# Patient Record
Sex: Female | Born: 1950 | Race: White | Hispanic: No | Marital: Single | Smoking: Never smoker
Health system: Southern US, Community
[De-identification: ages and names within clinical notes are randomized; demographics above are authoritative.]

## PROBLEM LIST (undated history)

## (undated) DIAGNOSIS — I10 Essential (primary) hypertension: Secondary | ICD-10-CM

## (undated) DIAGNOSIS — E66813 Obesity, class 3: Secondary | ICD-10-CM

## (undated) HISTORY — PX: STOMACH SURGERY: SHX791

## (undated) HISTORY — PX: JOINT REPLACEMENT: SHX530

## (undated) HISTORY — PX: BREAST BIOPSY: SHX20

## (undated) HISTORY — DX: Obesity, class 3: E66.813

## (undated) HISTORY — DX: Morbid (severe) obesity due to excess calories: E66.01

## (undated) HISTORY — PX: BREAST EXCISIONAL BIOPSY: SUR124

## (undated) HISTORY — PX: CHOLECYSTECTOMY: SHX55

---

## 1994-12-01 HISTORY — PX: BREAST CYST ASPIRATION: SHX578

## 1999-12-02 HISTORY — PX: ABDOMINAL HYSTERECTOMY: SHX81

## 2004-09-17 ENCOUNTER — Ambulatory Visit: Payer: Self-pay | Admitting: Unknown Physician Specialty

## 2004-10-07 ENCOUNTER — Inpatient Hospital Stay: Payer: Self-pay

## 2005-11-20 ENCOUNTER — Ambulatory Visit: Payer: Self-pay | Admitting: Unknown Physician Specialty

## 2006-05-25 ENCOUNTER — Ambulatory Visit: Payer: Self-pay | Admitting: Physician Assistant

## 2006-11-25 ENCOUNTER — Ambulatory Visit: Payer: Self-pay | Admitting: Unknown Physician Specialty

## 2008-06-05 ENCOUNTER — Other Ambulatory Visit: Payer: Self-pay

## 2008-06-05 ENCOUNTER — Ambulatory Visit: Payer: Self-pay | Admitting: Unknown Physician Specialty

## 2008-06-13 ENCOUNTER — Inpatient Hospital Stay: Payer: Self-pay | Admitting: Unknown Physician Specialty

## 2008-07-18 ENCOUNTER — Encounter: Payer: Self-pay | Admitting: Unknown Physician Specialty

## 2008-08-01 ENCOUNTER — Encounter: Payer: Self-pay | Admitting: Unknown Physician Specialty

## 2008-08-31 ENCOUNTER — Encounter: Payer: Self-pay | Admitting: Unknown Physician Specialty

## 2009-09-25 ENCOUNTER — Observation Stay: Payer: Self-pay | Admitting: Internal Medicine

## 2010-04-17 ENCOUNTER — Ambulatory Visit: Payer: Self-pay | Admitting: Unknown Physician Specialty

## 2011-07-31 ENCOUNTER — Ambulatory Visit: Payer: Self-pay | Admitting: Unknown Physician Specialty

## 2011-12-16 ENCOUNTER — Ambulatory Visit (INDEPENDENT_AMBULATORY_CARE_PROVIDER_SITE_OTHER): Payer: 59 | Admitting: Internal Medicine

## 2011-12-16 ENCOUNTER — Encounter: Payer: Self-pay | Admitting: Internal Medicine

## 2011-12-16 DIAGNOSIS — E559 Vitamin D deficiency, unspecified: Secondary | ICD-10-CM

## 2011-12-16 DIAGNOSIS — E785 Hyperlipidemia, unspecified: Secondary | ICD-10-CM

## 2011-12-16 DIAGNOSIS — Z79899 Other long term (current) drug therapy: Secondary | ICD-10-CM

## 2011-12-16 DIAGNOSIS — J45909 Unspecified asthma, uncomplicated: Secondary | ICD-10-CM

## 2011-12-16 DIAGNOSIS — R5383 Other fatigue: Secondary | ICD-10-CM

## 2011-12-16 DIAGNOSIS — Z1239 Encounter for other screening for malignant neoplasm of breast: Secondary | ICD-10-CM

## 2011-12-16 DIAGNOSIS — E162 Hypoglycemia, unspecified: Secondary | ICD-10-CM

## 2011-12-16 DIAGNOSIS — M542 Cervicalgia: Secondary | ICD-10-CM

## 2011-12-16 DIAGNOSIS — R5381 Other malaise: Secondary | ICD-10-CM

## 2011-12-16 DIAGNOSIS — M5000 Cervical disc disorder with myelopathy, unspecified cervical region: Secondary | ICD-10-CM | POA: Insufficient documentation

## 2011-12-16 DIAGNOSIS — E66813 Obesity, class 3: Secondary | ICD-10-CM

## 2011-12-16 DIAGNOSIS — G4730 Sleep apnea, unspecified: Secondary | ICD-10-CM

## 2011-12-16 MED ORDER — FLUTICASONE-SALMETEROL 250-50 MCG/DOSE IN AEPB: 1.0000 | INHALATION_SPRAY | Freq: Two times a day (BID) | RESPIRATORY_TRACT | Status: DC

## 2011-12-16 MED ORDER — ALBUTEROL SULFATE HFA 108 (90 BASE) MCG/ACT IN AERS: 2.0000 | INHALATION_SPRAY | Freq: Four times a day (QID) | RESPIRATORY_TRACT | Status: DC | PRN

## 2011-12-16 NOTE — Assessment & Plan Note (Addendum)
she is under the care of neyrologist presently and using cymbalta and gabapetnin with good results.  No changes today

## 2011-12-17 ENCOUNTER — Encounter: Payer: Self-pay | Admitting: Internal Medicine

## 2011-12-17 DIAGNOSIS — E1169 Type 2 diabetes mellitus with other specified complication: Secondary | ICD-10-CM | POA: Insufficient documentation

## 2011-12-17 DIAGNOSIS — Z79899 Other long term (current) drug therapy: Secondary | ICD-10-CM | POA: Insufficient documentation

## 2011-12-17 LAB — HEMOGLOBIN A1C: Hgb A1c MFr Bld: 6.3 % (ref 4.6–6.5)

## 2011-12-17 LAB — COMPREHENSIVE METABOLIC PANEL
Albumin: 4.3 g/dL (ref 3.5–5.2)
BUN: 15 mg/dL (ref 6–23)
CO2: 29 mEq/L (ref 19–32)
GFR: 98.63 mL/min (ref >60.00)
Glucose, Bld: 90 mg/dL (ref 70–99)
Sodium: 138 mEq/L (ref 135–145)
Total Bilirubin: 0.8 mg/dL (ref 0.3–1.2)
Total Protein: 7.4 g/dL (ref 6.0–8.3)

## 2011-12-17 LAB — VITAMIN D 25 HYDROXY (VIT D DEFICIENCY, FRACTURES): Vit D, 25-Hydroxy: 73 ng/mL (ref 30–89)

## 2011-12-17 NOTE — Assessment & Plan Note (Signed)
Managed with statin.  LFTs due

## 2011-12-17 NOTE — Progress Notes (Signed)
  Subjective:    Patient ID: Katie Huber, female    DOB: [birth year], 61 y.o.   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  She takes a statin and has not had LFTs done in 9 months,  She is also overdue for mammogram.  No specific complaints except frustration with inability to maintain weight loss .  She has been able in the past to lose 100 lbs but cannot keep it off. 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    Past Medical History  Diagnosis Date  . Asthma 1990  . Obesity, Class III, BMI 40-49.9 (morbid obesity)     weight fluctuations of 100 lbs    Past Surgical History  Procedure Date  . Joint replacement     Bilateral  . Abdominal hysterectomy 2001  . Stomach surgery [phone]    gastric partitionin( for obesity)   . Cholecystectomy 1970s    No current outpatient prescriptions on file prior to visit.       Review of Systems  Constitutional: Negative for fever, chills, appetite change, fatigue and unexpected weight change.  HENT: Negative for ear pain, congestion, sore throat, trouble swallowing, neck pain, voice change and sinus pressure.   Eyes: Negative for visual disturbance.  Respiratory: Negative for cough, shortness of breath, wheezing and stridor.   Cardiovascular: Negative for chest pain, palpitations and leg swelling.  Gastrointestinal: Negative for nausea, vomiting, abdominal pain, diarrhea, constipation, blood in stool, abdominal distention and anal bleeding.  Genitourinary: Negative for dysuria and flank pain.  Musculoskeletal: Negative for myalgias, arthralgias and gait problem.  Skin: Negative for color change and rash.  Neurological: Negative for dizziness and headaches.  Hematological: Negative for adenopathy. Does not bruise/bleed easily.  Psychiatric/Behavioral:  Negative for suicidal ideas, sleep disturbance and dysphoric mood. The patient is not nervous/anxious.        Objective:   Physical Exam        Assessment & Plan:

## 2011-12-17 NOTE — Assessment & Plan Note (Signed)
Spent 10 minutes reviewing her diet and available optiosn ,  Reinforced need for aerobic exercise and recommended water aerobics.

## 2011-12-18 LAB — TSH: TSH: 0.76 u[IU]/mL (ref 0.35–5.50)

## 2012-01-12 ENCOUNTER — Encounter: Payer: Self-pay | Admitting: Physical Medicine and Rehabilitation

## 2012-01-19 ENCOUNTER — Other Ambulatory Visit: Payer: Self-pay | Admitting: Internal Medicine

## 2012-01-19 MED ORDER — PAROXETINE HCL 20 MG PO TABS: 20.0000 mg | ORAL_TABLET | ORAL | Status: DC

## 2012-01-19 NOTE — Telephone Encounter (Signed)
Patient is needing a prescription faxed to [facility] Paxil 20 mg bid 90 day supply.

## 2012-01-19 NOTE — Telephone Encounter (Signed)
rx on printer [facility] to [facility]

## 2012-01-21 ENCOUNTER — Other Ambulatory Visit: Payer: Self-pay | Admitting: *Deleted

## 2012-01-21 MED ORDER — PAROXETINE HCL 20 MG PO TABS: 20.0000 mg | ORAL_TABLET | ORAL | Status: DC

## 2012-01-21 NOTE — Telephone Encounter (Signed)
Order printed on 01/19/12 sent [facility] today to [facility].

## 2012-01-28 ENCOUNTER — Telehealth: Payer: Self-pay | Admitting: Internal Medicine

## 2012-01-28 DIAGNOSIS — Z79899 Other long term (current) drug therapy: Secondary | ICD-10-CM

## 2012-01-28 NOTE — Telephone Encounter (Signed)
Left message asking patient to return my call.

## 2012-01-28 NOTE — Telephone Encounter (Signed)
Office Message [address] [place], [place] [address] p. [phone] f. [phone] To: [facility] (Daytime Triage) Fax: [phone] From: [facility] Date/ Time: 01/28/2012 3:52 PM Taken By: Thomasena Edis, CSR Caller: Silvio Pate Facility: not collected Patient: Katie Huber, Katie Huber DOB: 06-26-1951 Phone: [phone] Reason for Call: Please call patient she says that the pharmacy has canceled her paxal order and she is out of it and going through withdrawals of panic attack and nausea and that she needs some sent to local pharmacy so she can pick this up. Please call asap. Regarding Appointment: Appt Date: Appt Time: Unknown Provider: Reason: Details: Outcome:

## 2012-01-28 NOTE — Telephone Encounter (Signed)
Office Message [address] [place], [place] [address] p. [phone] f. [phone] To: [facility] (Daytime Triage) Fax: [phone] From: [facility] Date/ Time: 01/28/2012 10:33 AM Taken By: Annalee Genta, CSR Caller: Silvio Pate Facility: not collected Patient: Katie, Huber DOB: 09-26-1951 Phone: [phone] Reason for Call: [facility] has faxed request for Paxil refill; pt states pharmacist did not receive reply so they have cancelled this script. Please call her as soon as possible to advise. Regarding Appointment: Appt Date: Appt Time: Unknown Provider: Reason: Details: Outcome:

## 2012-01-29 MED ORDER — PAROXETINE HCL 20 MG PO TABS: 20.0000 mg | ORAL_TABLET | ORAL | Status: DC

## 2012-01-29 NOTE — Telephone Encounter (Signed)
Rx sent to pharmacy   

## 2012-01-29 NOTE — Telephone Encounter (Signed)
Ok to call in Paxil 20 mg one tablet daily #30 with 3 refills to local pharmacy , chart updated

## 2012-01-30 ENCOUNTER — Encounter: Payer: Self-pay | Admitting: Physical Medicine and Rehabilitation

## 2012-03-01 ENCOUNTER — Encounter: Payer: Self-pay | Admitting: Physical Medicine and Rehabilitation

## 2012-04-13 ENCOUNTER — Ambulatory Visit: Payer: Self-pay | Admitting: Internal Medicine

## 2012-04-14 LAB — HM MAMMOGRAPHY: HM Mammogram: NORMAL

## 2012-05-11 ENCOUNTER — Encounter: Payer: Self-pay | Admitting: Internal Medicine

## 2012-06-02 ENCOUNTER — Ambulatory Visit: Payer: 59 | Admitting: Internal Medicine

## 2012-08-04 ENCOUNTER — Other Ambulatory Visit: Payer: Self-pay | Admitting: *Deleted

## 2012-08-04 MED ORDER — PAROXETINE HCL 20 MG PO TABS: 20.0000 mg | ORAL_TABLET | ORAL | Status: DC

## 2012-09-20 LAB — HM DIABETES EYE EXAM: HM Diabetic Eye Exam: NORMAL

## 2012-10-18 ENCOUNTER — Other Ambulatory Visit: Payer: Self-pay | Admitting: *Deleted

## 2012-10-20 ENCOUNTER — Other Ambulatory Visit: Payer: Self-pay

## 2012-10-20 MED ORDER — PAROXETINE HCL 20 MG PO TABS: 20.0000 mg | ORAL_TABLET | ORAL | Status: DC

## 2012-10-20 NOTE — Telephone Encounter (Signed)
90 days, no refills  Needs offive visit and labs before any more refills of any meds.

## 2012-10-20 NOTE — Telephone Encounter (Signed)
Patient request a refill for generic and she wants a 90 day supply.

## 2012-10-21 ENCOUNTER — Other Ambulatory Visit: Payer: Self-pay

## 2012-10-25 ENCOUNTER — Other Ambulatory Visit: Payer: Self-pay | Admitting: Internal Medicine

## 2012-10-27 ENCOUNTER — Other Ambulatory Visit: Payer: Self-pay

## 2012-10-29 ENCOUNTER — Other Ambulatory Visit: Payer: Self-pay

## 2012-11-15 ENCOUNTER — Encounter: Payer: Self-pay | Admitting: Internal Medicine

## 2012-11-15 ENCOUNTER — Ambulatory Visit (INDEPENDENT_AMBULATORY_CARE_PROVIDER_SITE_OTHER): Payer: Self-pay | Admitting: Internal Medicine

## 2012-11-15 DIAGNOSIS — G4730 Sleep apnea, unspecified: Secondary | ICD-10-CM

## 2012-11-15 DIAGNOSIS — Z79899 Other long term (current) drug therapy: Secondary | ICD-10-CM

## 2012-11-15 MED ORDER — OMEPRAZOLE 20 MG PO CPDR: 20.0000 mg | DELAYED_RELEASE_CAPSULE | Freq: Two times a day (BID) | ORAL | Status: DC

## 2012-11-15 NOTE — Patient Instructions (Addendum)
Please let me know if I can help with the referral for your eating disorder  Return for fasting labs at your earliest convenience.

## 2012-11-15 NOTE — Progress Notes (Signed)
Patient ID: Katie Huber, female   DOB: Dec 05, 1950, 61 y.o.   MRN: [id number] \ Patient Active Problem List  Diagnosis  . Cervicalgia  . Obesity, Class III, BMI 40-49.9 (morbid obesity)  . Sleep apnea  . Other and unspecified hyperlipidemia  . Encounter for long-term (current) use of other medications    Subjective:  CC:   Chief Complaint  Patient presents with  . Medication Refill    HPI:   Katie Huber a 61 y.o. female who presents Follow up on chronic conditions including morbid obesity, OSA on CPAP , hypertension.  She has had no resp event this year.  Has gained 27 lb this year.  Eats bread and butter daily. Vegetable,  Had gastric bypass years ago.  States that she has  Had no difficulty losing 100 lbs at a time ,  But gains it back. Thinks she has an eating disorder but does not want a referral "not a good time.' Doesn't feel she is depressed . Lives alone for the last several years after her long term companion died a few years ago .  Her dog died a few months ago after being hit by a car while they were walking. Cries easily when she talks about her.  Has grown children nearby.  Bilateral shoulder pain with prior EMG nerve conduction studies. Didn't finish workup.  Taking advil.   Has OSA, ;last sleep study over 5 yrs ago,  Wears her CPAP every night.  No morning headaches , morning fatigue, or daytime somlonenc.e .  BP at  Home usually under 140/70.  NEEDS REFILLS ON ALL MEDS    Past Medical History  Diagnosis Date  . Asthma 1990  . Obesity, Class III, BMI 40-49.9 (morbid obesity)     weight fluctuations of 100 lbs     Past Surgical History  Procedure Date  . Joint replacement     Bilateral  . Abdominal hysterectomy 2001  . Stomach surgery [phone]    gastric partitionin( for obesity)   . Cholecystectomy 1970s    The following portions of the patient's history were reviewed and updated as appropriate: Allergies, current medications, and problem list.    Review  of Systems:   Patient denies headache, fevers, malaise, unintentional weight loss, skin rash, eye pain, sinus congestion and sinus pain, sore throat, dysphagia,  hemoptysis , cough, dyspnea, wheezing, chest pain, palpitations, orthopnea, edema, abdominal pain, nausea, melena, diarrhea, constipation, flank pain, dysuria, hematuria, urinary  Frequency, nocturia, numbness, tingling, seizures,  Focal weakness, Loss of consciousness,  Tremor, insomnia, depression, anxiety, and suicidal ideation.        History   Social History  . Marital Status: Single    Spouse Name: N/A    Number of Children: N/A  . Years of Education: N/A   Occupational History  . Not on file.   Social History Main Topics  . Smoking status: Never Smoker   . Smokeless tobacco: Not on file  . Alcohol Use: Not on file  . Drug Use: Not on file  . Sexually Active: Not on file   Other Topics Concern  . Not on file   Social History Narrative  . No narrative on file    Objective:  BP 160/72  Pulse 66  Temp 97.9 F (36.6 C) (Oral)  Resp 12  Ht 5\' 7"  (1.702 m)  Wt 350 lb 8 oz (158.986 kg)  BMI 54.90 kg/m2  SpO2 95%  General appearance: alert, cooperative and appears stated age. Morbidly  obese Ears: normal TM's and external ear canals both ears Throat: lips, mucosa, and tongue normal; teeth and gums normal Neck: no adenopathy, no carotid bruit, supple, symmetrical, trachea midline and thyroid not enlarged, symmetric, no tenderness/mass/nodules Back: symmetric, no curvature. ROM normal. No CVA tenderness. Lungs: clear to auscultation bilaterally Heart: regular rate and rhythm, S1, S2 normal, no murmur, click, rub or gallop Abdomen: soft, non-tender; bowel sounds normal; no masses,  no organomegaly Pulses: 2+ and symmetric Skin: Skin color, texture, turgor normal. No rashes or lesions Lymph nodes: Cervical, supraclavicular, and axillary nodes normal.  Assessment and Plan:  Encounter for long-term (current)  use of other medications CMET due for use of crestor and maxzide  Obesity, Class III, BMI 40-49.9 (morbid obesity) I have addressed  BMI and recommended a low glycemic index diet utilizing smaller more frequent meals to increase metabolism.  I have also recommended that patient start exercising with a goal of 30 minutes of aerobic exercise a minimum of 5 days per week. Screening for lipid disorders, thyroid and diabetes to be done today.  She is resistant to structured diet, does not exercise, and has gained 27 ls this year,  BMI over 50 despite prior bariatric surgery .  Referral for eating disordered offered but deferred,   Sleep apnea She reports compliance. Last study was 5 years ago; does not want repeat study although it was recommended   Updated Medication List Outpatient Encounter Prescriptions as of 11/15/2012  Medication Sig Dispense Refill  . albuterol (PROVENTIL HFA;VENTOLIN HFA) 108 (90 BASE) MCG/ACT inhaler Inhale 2 puffs into the lungs every 6 (six) hours as needed for wheezing.  3 Inhaler  3  . amLODipine (NORVASC) 5 MG tablet Take 5 mg by mouth daily.      . Fluticasone-Salmeterol (ADVAIR DISKUS) 250-50 MCG/DOSE AEPB Inhale 1 puff into the lungs 2 (two) times daily.  180 each  3  . omeprazole (PRILOSEC) 20 MG capsule Take 1 capsule (20 mg total) by mouth 2 (two) times daily.  60 capsule  11  . PARoxetine (PAXIL) 20 MG tablet Take 1 tablet (20 mg total) by mouth every morning.  90 tablet  0  . Probiotic Product (PROBIOTIC DAILY PO) Take by mouth daily.      [name] triamterene-hydrochlorothiazide (MAXZIDE-25) 37.5-25 MG per tablet Take 1 tablet by mouth daily.      . [DISCONTINUED] omeprazole (PRILOSEC) 20 MG capsule Take 20 mg by mouth daily.      . DULoxetine (CYMBALTA) 60 MG capsule Take 60 mg by mouth daily.      [name] gabapentin (NEURONTIN) 600 MG tablet Take 600 mg by mouth 3 (three) times daily.      . rosuvastatin (CRESTOR) 5 MG tablet Take 5 mg by mouth daily.          Orders Placed This Encounter  Procedures  . Lipid panel  . Comprehensive metabolic panel  . TSH    No Follow-up on file.

## 2012-11-16 ENCOUNTER — Encounter: Payer: Self-pay | Admitting: Internal Medicine

## 2012-11-16 NOTE — Assessment & Plan Note (Signed)
CMET due for use of crestor and maxzide

## 2012-11-16 NOTE — Assessment & Plan Note (Signed)
She reports compliance. Last study was 5 years ago; does not want repeat study although it was recommended

## 2012-11-16 NOTE — Assessment & Plan Note (Signed)
I have addressed  BMI and recommended a low glycemic index diet utilizing smaller more frequent meals to increase metabolism.  I have also recommended that patient start exercising with a goal of 30 minutes of aerobic exercise a minimum of 5 days per week. Screening for lipid disorders, thyroid and diabetes to be done today.  She is resistant to structured diet, does not exercise, and has gained 27 ls this year,  BMI over 50 despite prior bariatric surgery .  Referral for eating disordered offered but deferred,

## 2012-11-23 ENCOUNTER — Other Ambulatory Visit: Payer: Self-pay

## 2012-11-30 ENCOUNTER — Ambulatory Visit: Payer: 59 | Admitting: Internal Medicine

## 2012-12-17 ENCOUNTER — Ambulatory Visit: Payer: 59 | Admitting: Internal Medicine

## 2013-01-20 ENCOUNTER — Other Ambulatory Visit: Payer: Self-pay | Admitting: *Deleted

## 2013-01-20 MED ORDER — PAROXETINE HCL 20 MG PO TABS: 20.0000 mg | ORAL_TABLET | ORAL | Status: DC

## 2013-01-20 NOTE — Telephone Encounter (Signed)
Med filled.  

## 2013-01-21 ENCOUNTER — Other Ambulatory Visit: Payer: Self-pay | Admitting: General Practice

## 2013-01-21 MED ORDER — PAROXETINE HCL 20 MG PO TABS: 20.0000 mg | ORAL_TABLET | ORAL | Status: DC

## 2013-01-21 MED ORDER — TRIAMTERENE-HCTZ 37.5-25 MG PO TABS: 1.0000 | ORAL_TABLET | Freq: Every day | ORAL | Status: DC

## 2013-01-21 NOTE — Telephone Encounter (Signed)
Med filled.  

## 2013-02-03 ENCOUNTER — Telehealth: Payer: Self-pay | Admitting: Internal Medicine

## 2013-02-03 NOTE — Telephone Encounter (Signed)
Pt came by her insurance has changed and she needs all  her rx's sent to [facility]   Fax # [phone] Please advise when these are sent in

## 2013-02-07 ENCOUNTER — Other Ambulatory Visit: Payer: Self-pay | Admitting: General Practice

## 2013-02-07 MED ORDER — TRIAMTERENE-HCTZ 37.5-25 MG PO TABS: 1.0000 | ORAL_TABLET | Freq: Every day | ORAL | Status: DC

## 2013-02-07 MED ORDER — PAROXETINE HCL 20 MG PO TABS: 20.0000 mg | ORAL_TABLET | ORAL | Status: DC

## 2013-02-07 MED ORDER — AMLODIPINE BESYLATE 5 MG PO TABS: 5.0000 mg | ORAL_TABLET | Freq: Every day | ORAL | Status: DC

## 2013-02-07 MED ORDER — OMEPRAZOLE 20 MG PO CPDR: 20.0000 mg | DELAYED_RELEASE_CAPSULE | Freq: Two times a day (BID) | ORAL | Status: DC

## 2013-02-07 NOTE — Telephone Encounter (Signed)
Meds sent to [facility].

## 2013-02-07 NOTE — Telephone Encounter (Signed)
Meds filled for 90 days and sent to [facility].

## 2013-02-11 ENCOUNTER — Other Ambulatory Visit (INDEPENDENT_AMBULATORY_CARE_PROVIDER_SITE_OTHER): Payer: BC Managed Care – PPO

## 2013-02-11 LAB — COMPREHENSIVE METABOLIC PANEL
Albumin: 4 g/dL (ref 3.5–5.2)
BUN: 14 mg/dL (ref 6–23)
CO2: 29 mEq/L (ref 19–32)
Calcium: 9.4 mg/dL (ref 8.4–10.5)
GFR: 87.31 mL/min (ref >60.00)
Glucose, Bld: 145 mg/dL — ABNORMAL HIGH (ref 70–99)
Potassium: 4.1 mEq/L (ref 3.5–5.1)
Sodium: 136 mEq/L (ref 135–145)
Total Protein: 7.3 g/dL (ref 6.0–8.3)

## 2013-02-11 LAB — TSH: TSH: 1.05 u[IU]/mL (ref 0.35–5.50)

## 2013-02-11 LAB — LDL CHOLESTEROL, DIRECT: Direct LDL: 119.1 mg/dL

## 2013-03-01 ENCOUNTER — Encounter: Payer: Self-pay | Admitting: Internal Medicine

## 2013-03-01 ENCOUNTER — Ambulatory Visit (INDEPENDENT_AMBULATORY_CARE_PROVIDER_SITE_OTHER): Payer: BC Managed Care – PPO | Admitting: Internal Medicine

## 2013-03-01 VITALS — BP 140/72 | HR 66 | Temp 98.2°F | Wt 361.0 lb

## 2013-03-01 DIAGNOSIS — E119 Type 2 diabetes mellitus without complications: Secondary | ICD-10-CM

## 2013-03-01 DIAGNOSIS — E1169 Type 2 diabetes mellitus with other specified complication: Secondary | ICD-10-CM

## 2013-03-01 DIAGNOSIS — E669 Obesity, unspecified: Secondary | ICD-10-CM

## 2013-03-01 NOTE — Patient Instructions (Addendum)

## 2013-03-02 DIAGNOSIS — E1169 Type 2 diabetes mellitus with other specified complication: Secondary | ICD-10-CM | POA: Insufficient documentation

## 2013-03-02 NOTE — Assessment & Plan Note (Addendum)
A1c 6.3, fasting glucose 145.  I have addressed  BMI and new onset diabetes and recommended a low glycemic index diet utilizing smaller more frequent meals to increase metabolism.  I have also recommended that patient start exercising with a goal of 30 minutes of aerobic exercise a minimum of 5 days per week.  Diabetic  eye exam advised. Foot exam normal.

## 2013-03-02 NOTE — Progress Notes (Signed)
Patient ID: Katie Huber, female   DOB: 08-Dec-1950, 62 y.o.   MRN: [id number] Subjective:    Katie Huber is a 62 y.o. female who presents for an initial evaluation of Type 2 diabetes mellitus.  Current symptoms/problems include none . The patient was initially diagnosed with Type 2 diabetes mellitus based on the following criteria:  fasting glucose 145,  hgba1c 6.3 .  Known diabetic complications: none Cardiovascular risk factors: obesity (BMI >= 30 kg/m2) Current diabetic medications include none.   Eye exam current (within one year): unknown Weight trend: increasing steadily Prior visit with dietician: no Current diet: in general, an "unhealthy" diet Current exercise: none  Current monitoring regimen: none Home blood sugar records: does not check Any episodes of hypoglycemia? no  Is She on ACE inhibitor or angiotensin II receptor blocker?  No   The following portions of the patient's history were reviewed and updated as appropriate: allergies, current medications, past family history, past medical history, past social history, past surgical history and problem list.  Review of Systems A comprehensive review of systems was negative.    Objective:    BP 140/72  Pulse 66  Temp(Src) 98.2 F (36.8 C) (Oral)  Wt 361 lb (163.749 kg)  BMI 56.53 kg/m2  SpO2 97%  General:  alert, cooperative and appears stated age  Oropharynx: lips, mucosa, and tongue normal; teeth and gums normal   Eyes:  conjunctivae/corneas clear. PERRL, EOM's intact. Fundi benign.   Ears:  normal TM's and external ear canals both ears  Neck: no adenopathy, no carotid bruit, no JVD, supple, symmetrical, trachea midline and thyroid not enlarged, symmetric, no tenderness/mass/nodules  Thyroid:  no palpable nodule  Lung: clear to auscultation bilaterally  Heart:  normal apical impulse  Abdomen: soft, non-tender; bowel sounds normal; no masses,  no organomegaly  Extremities: extremities normal, atraumatic, no  cyanosis or edema  Skin: warm and dry, no hyperpigmentation, vitiligo, or suspicious lesions  Pulses: 2+ and symmetric  Neuro: normal without focal findings, mental status, speech normal, alert and oriented x3, PERLA and reflexes normal and symmetric   Lab Review Glucose, Bld (mg/dL)  Date Value  7/82/9562 145*  12/16/2011 90      CO2 (mEq/L)  Date Value  02/11/2013 29   12/16/2011 29      BUN (mg/dL)  Date Value  12/31/8655 14   12/16/2011 15      Creatinine, Ser (mg/dL)  Date Value  8/46/9629 0.7   12/16/2011 0.7     Assessment and Plan  Diabetes mellitus type 2 in obese A1c 6.3, fasting glucose 145.  I have addressed  BMI and new onset diabetes and recommended a low glycemic index diet utilizing smaller more frequent meals to increase metabolism.  I have also recommended that patient start exercising with a goal of 30 minutes of aerobic exercise a minimum of 5 days per week.  Diabetic  eye exam advised. Foot exam normal.   A total of 30 minutes was spent with patient more than half of which was spent in counseling, reviewing records from other prviders and coordination of care.  Updated Medication List Outpatient Encounter Prescriptions as of 03/01/2013  Medication Sig Dispense Refill  . amLODipine (NORVASC) 5 MG tablet Take 1 tablet (5 mg total) by mouth daily.  90 tablet  1  . omeprazole (PRILOSEC) 20 MG capsule Take 1 capsule (20 mg total) by mouth 2 (two) times daily.  180 capsule  1  . PARoxetine (PAXIL) 20 MG tablet Take  1 tablet (20 mg total) by mouth every morning.  90 tablet  1  . Probiotic Product (PROBIOTIC DAILY PO) Take by mouth daily.      [name] triamterene-hydrochlorothiazide (MAXZIDE-25) 37.5-25 MG per tablet Take 1 each (1 tablet total) by mouth daily.  90 tablet  1  . albuterol (PROVENTIL HFA;VENTOLIN HFA) 108 (90 BASE) MCG/ACT inhaler Inhale 2 puffs into the lungs every 6 (six) hours as needed for wheezing.  3 Inhaler  3  . DULoxetine (CYMBALTA) 60 MG capsule  Take 60 mg by mouth daily.      . Fluticasone-Salmeterol (ADVAIR DISKUS) 250-50 MCG/DOSE AEPB Inhale 1 puff into the lungs 2 (two) times daily.  180 each  3  . gabapentin (NEURONTIN) 600 MG tablet Take 600 mg by mouth 3 (three) times daily.      . rosuvastatin (CRESTOR) 5 MG tablet Take 5 mg by mouth daily.       No facility-administered encounter medications on file as of 03/01/2013.

## 2013-04-05 ENCOUNTER — Telehealth: Payer: Self-pay | Admitting: *Deleted

## 2013-04-05 NOTE — Telephone Encounter (Signed)
Patient called stating received bill for bariatric, gave patient billing dept number please advise if I should do anything thanks

## 2013-04-08 ENCOUNTER — Telehealth: Payer: Self-pay | Admitting: Internal Medicine

## 2013-04-08 DIAGNOSIS — N390 Urinary tract infection, site not specified: Secondary | ICD-10-CM | POA: Insufficient documentation

## 2013-04-08 MED ORDER — SULFAMETHOXAZOLE-TRIMETHOPRIM 800-160 MG PO TABS: 1.0000 | ORAL_TABLET | Freq: Two times a day (BID) | ORAL | Status: DC

## 2013-04-08 NOTE — Telephone Encounter (Signed)
Patient Information:  Caller Name: Diarra  Phone: [phone]  Patient: Katie Huber, Katie Huber  Gender: Female  DOB: 10-14-1951  Age: 62 Years  PCP: Duncan Dull (Adults only)  Office Follow Up:  Does the office need to follow up with this patient?: Yes  Instructions For The Office: OFFICE PT IS REQUESTING A RX OF [facility] [place]  PT REFUSED AN APPT OFFERED BY RN.  OFFICE PLEASE FOLLOW UP WITH PT  RN Note:  RN offered patient an appt to come into office, but pt states MD told her to just call if she needed anything.  Symptoms  Reason For Call & Symptoms: UTI symptoms :  urgency, pain with urination, and blood in urine  Reviewed Health History In EMR: Yes  Reviewed Medications In EMR: Yes  Reviewed Allergies In EMR: Yes  Reviewed Surgeries / Procedures: Yes  Date of Onset of Symptoms: 04/08/2013  Guideline(s) Used:  Urination Pain - Female  Disposition Per Guideline:   See Today in Office  Reason For Disposition Reached:   Age > 50 years  Advice Given:  N/A  Patient Refused Recommendation:  Patient Requests Prescription  Pt is requesting RX of Bactrim for her urinary symptoms

## 2013-04-08 NOTE — Telephone Encounter (Signed)
Called patient to verify s\s patient states its a UTI and you have treated her before with Bactrim.

## 2013-04-08 NOTE — Telephone Encounter (Signed)
Since it is Friday afternoon  Ok to treat based on symptoms.  I sent septra DS to [facility].  If symptosm do not resolve she needs to make appt next week with one of the other providers.

## 2013-04-11 NOTE — Telephone Encounter (Signed)
Patient given billing departmentt number

## 2013-04-13 NOTE — Telephone Encounter (Signed)
Reached patient , and she stated feeling much with the antibiotic.

## 2013-04-13 NOTE — Telephone Encounter (Signed)
Tried to reach patient and see if symptoms have subsided patient has not returned call.

## 2013-05-11 ENCOUNTER — Encounter: Payer: Self-pay | Admitting: Internal Medicine

## 2013-05-11 ENCOUNTER — Ambulatory Visit (INDEPENDENT_AMBULATORY_CARE_PROVIDER_SITE_OTHER): Payer: BC Managed Care – PPO | Admitting: Internal Medicine

## 2013-05-11 VITALS — BP 162/82 | HR 67 | Temp 97.9°F | Resp 16 | Wt 337.2 lb

## 2013-05-11 DIAGNOSIS — J069 Acute upper respiratory infection, unspecified: Secondary | ICD-10-CM

## 2013-05-11 MED ORDER — OLOPATADINE HCL 0.1 % OP SOLN: 1.0000 [drp] | Freq: Two times a day (BID) | OPHTHALMIC | Status: DC

## 2013-05-11 MED ORDER — HYDROCOD POLST-CHLORPHEN POLST 10-8 MG/5ML PO LQCR: 5.0000 mL | Freq: Every evening | ORAL | Status: DC | PRN

## 2013-05-11 MED ORDER — CIPROFLOXACIN HCL 0.3 % OP SOLN: 1.0000 [drp] | OPHTHALMIC | Status: DC

## 2013-05-11 MED ORDER — FEXOFENADINE-PSEUDOEPHED ER 180-240 MG PO TB24: 1.0000 | ORAL_TABLET | Freq: Every day | ORAL | Status: DC

## 2013-05-11 MED ORDER — LEVOFLOXACIN 500 MG PO TABS: 500.0000 mg | ORAL_TABLET | Freq: Every day | ORAL | Status: DC

## 2013-05-11 NOTE — Patient Instructions (Addendum)
You have an upper respiratory infection   .  I am prescribing an antibiotic (levaquin ) To manage the infection and the inflammation in your ear/sinuses.   I also advise use of the following OTC meds to help with your other symptoms.   Take generic OTC benadryl 25 mg every 8 hours for the drainage,  Sudafed PE  10 to 30 mg every 8 hours for the congestion,  Or Allegra D which combines an antihistamine nad a decongestnat.  flushes your sinuses twice daily with Simply Saline (do over the sink because if you do it right you will spit out globs of mucus)  Use Tussionex for nighttime COUGH.  Gargle with salt water as needed for sore throat.   You have no sigbs of bacterial conjunctivitis at this time, just allergic or viral.  You can use the Patanol eye drops I have prescribed.  If you develop green or purulent eye drainage,  Start the  antibiotic eye drops (ciloxan)

## 2013-05-11 NOTE — Progress Notes (Signed)
Patient ID: Katie Huber, female   DOB: February 05, 1951, 62 y.o.   MRN: [id number]   Patient Active Problem List   Diagnosis Date Noted  . Acute upper respiratory infections of unspecified site 05/12/2013  . UTI (urinary tract infection) 04/08/2013  . Diabetes mellitus type 2 in obese 03/02/2013  . Other and unspecified hyperlipidemia 12/17/2011  . Encounter for long-term (current) use of other medications 12/17/2011  . Cervicalgia 12/16/2011  . Sleep apnea 12/16/2011  . Obesity, Class III, BMI 40-49.9 (morbid obesity)     Subjective:  CC:   Chief Complaint  Patient presents with  . Acute Visit    cough green sputum , left eye very red, sore throat.    HPI:   Katie Huber a 62 y.o. female who presents with a Productive cough.  She started coughing a week ago.and began running a low grade fever with cough productive og green sputum for the last 24 hours.  She has also developed conjunctivitis.      Past Medical History  Diagnosis Date  . Asthma 1990  . Obesity, Class III, BMI 40-49.9 (morbid obesity)     weight fluctuations of 100 lbs     Past Surgical History  Procedure Laterality Date  . Joint replacement      Bilateral  . Abdominal hysterectomy  2001  . Stomach surgery  [phone]    gastric partitionin( for obesity)   . Cholecystectomy  1970s       The following portions of the patient's history were reviewed and updated as appropriate: Allergies, current medications, and problem list.    Review of Systems:   Patient denies headache, fevers, malaise, unintentional weight loss, skin rash, eye pain, sinus congestion and sinus pain, sore throat, dysphagia,  hemoptysis , cough, dyspnea, wheezing, chest pain, palpitations, orthopnea, edema, abdominal pain, nausea, melena, diarrhea, constipation, flank pain, dysuria, hematuria, urinary  Frequency, nocturia, numbness, tingling, seizures,  Focal weakness, Loss of consciousness,  Tremor, insomnia, depression, anxiety, and  suicidal ideation.     History   Social History  . Marital Status: Single    Spouse Name: N/A    Number of Children: N/A  . Years of Education: N/A   Occupational History  . Not on file.   Social History Main Topics  . Smoking status: Never Smoker   . Smokeless tobacco: Not on file  . Alcohol Use: Not on file  . Drug Use: Not on file  . Sexually Active: Not on file   Other Topics Concern  . Not on file   Social History Narrative  . No narrative on file    Objective:  BP 162/82  Pulse 67  Temp(Src) 97.9 F (36.6 C) (Oral)  Resp 16  Wt 337 lb 4 oz (152.976 kg)  BMI 52.81 kg/m2  SpO2 97%  General appearance: alert, cooperative and appears stated age Ears: bulging erythematous left TM  Throat: lips, mucosa, and tongue normal; teeth and gums normal Neck: bilateral cervical LAD. , no carotid bruit, supple, symmetrical, trachea midline and thyroid not enlarged, symmetric, no tenderness/mass/nodules Back: symmetric, no curvature. ROM normal. No CVA tenderness. Lungs: clear to auscultation bilaterally Heart: regular rate and rhythm, S1, S2 normal, no murmur, click, rub or gallop Abdomen: soft, non-tender; bowel sounds normal; no masses,  no organomegaly Pulses: 2+ and symmetric Skin: Skin color, texture, turgor normal. No rashes or lesions Lymph nodes: Cervical, supraclavicular, and axillary nodes normal.  Assessment and Plan:  Acute upper respiratory infections of unspecified site empiric  antibioitics and supportive care given apperance of otitis media on exam accompanied by LAD  And bronchitis.   Obesity, Class III, BMI 40-49.9 (morbid obesity) She has lost 24 lbs on the low GI diet. Encouragement given.    Updated Medication List Outpatient Encounter Prescriptions as of 05/11/2013  Medication Sig Dispense Refill  . amLODipine (NORVASC) 5 MG tablet Take 1 tablet (5 mg total) by mouth daily.  90 tablet  1  . omeprazole (PRILOSEC) 20 MG capsule Take 1 capsule (20  mg total) by mouth 2 (two) times daily.  180 capsule  1  . PARoxetine (PAXIL) 20 MG tablet Take 1 tablet (20 mg total) by mouth every morning.  90 tablet  1  . Probiotic Product (PROBIOTIC DAILY PO) Take by mouth daily.      [name] triamterene-hydrochlorothiazide (MAXZIDE-25) 37.5-25 MG per tablet Take 1 each (1 tablet total) by mouth daily.  90 tablet  1  . albuterol (PROVENTIL HFA;VENTOLIN HFA) 108 (90 BASE) MCG/ACT inhaler Inhale 2 puffs into the lungs every 6 (six) hours as needed for wheezing.  3 Inhaler  3  . chlorpheniramine-HYDROcodone (TUSSIONEX) 10-8 MG/5ML LQCR Take 5 mLs by mouth at bedtime as needed.  240 mL  0  . ciprofloxacin (CILOXAN) 0.3 % ophthalmic solution Place 1 drop into the left eye every 2 (two) hours. Administer 1 drop, every 2 hours, while awake, for 2 days. Then 1 drop, every 4 hours, while awake, for the next 5 days.  5 mL  0  . fexofenadine-pseudoephedrine (ALLEGRA-D 24 HOUR) 180-240 MG per 24 hr tablet Take 1 tablet by mouth daily.  30 tablet  0  . Fluticasone-Salmeterol (ADVAIR DISKUS) 250-50 MCG/DOSE AEPB Inhale 1 puff into the lungs 2 (two) times daily.  180 each  3  . levofloxacin (LEVAQUIN) 500 MG tablet Take 1 tablet (500 mg total) by mouth daily.  7 tablet  0  . olopatadine (PATANOL) 0.1 % ophthalmic solution Place 1 drop into the left eye 2 (two) times daily.  5 mL  12  . sulfamethoxazole-trimethoprim (BACTRIM DS,SEPTRA DS) 800-160 MG per tablet Take 1 tablet by mouth 2 (two) times daily.  6 tablet  0  . [DISCONTINUED] DULoxetine (CYMBALTA) 60 MG capsule Take 60 mg by mouth daily.      . [DISCONTINUED] gabapentin (NEURONTIN) 600 MG tablet Take 600 mg by mouth 3 (three) times daily.      . [DISCONTINUED] rosuvastatin (CRESTOR) 5 MG tablet Take 5 mg by mouth daily.       No facility-administered encounter medications on file as of 05/11/2013.     No orders of the defined types were placed in this encounter.    No Follow-up on file.

## 2013-05-12 ENCOUNTER — Encounter: Payer: Self-pay | Admitting: Internal Medicine

## 2013-05-12 DIAGNOSIS — J069 Acute upper respiratory infection, unspecified: Secondary | ICD-10-CM | POA: Insufficient documentation

## 2013-05-12 NOTE — Assessment & Plan Note (Signed)
She has lost 24 lbs on the low GI diet. Encouragement given.

## 2013-05-12 NOTE — Assessment & Plan Note (Signed)
empiric antibioitics and supportive care given apperance of otitis media on exam accompanied by LAD  And bronchitis.

## 2013-06-14 ENCOUNTER — Encounter: Payer: Self-pay | Admitting: Internal Medicine

## 2013-06-14 ENCOUNTER — Ambulatory Visit (INDEPENDENT_AMBULATORY_CARE_PROVIDER_SITE_OTHER): Payer: BC Managed Care – PPO | Admitting: Internal Medicine

## 2013-06-14 VITALS — BP 158/78 | HR 67 | Temp 97.7°F | Resp 14 | Wt 330.5 lb

## 2013-06-14 DIAGNOSIS — F489 Nonpsychotic mental disorder, unspecified: Secondary | ICD-10-CM

## 2013-06-14 DIAGNOSIS — F409 Phobic anxiety disorder, unspecified: Secondary | ICD-10-CM | POA: Insufficient documentation

## 2013-06-14 DIAGNOSIS — E669 Obesity, unspecified: Secondary | ICD-10-CM

## 2013-06-14 DIAGNOSIS — G4730 Sleep apnea, unspecified: Secondary | ICD-10-CM

## 2013-06-14 DIAGNOSIS — E119 Type 2 diabetes mellitus without complications: Secondary | ICD-10-CM

## 2013-06-14 LAB — HEMOGLOBIN A1C: Hgb A1c MFr Bld: 6.1 % (ref 4.6–6.5)

## 2013-06-14 LAB — COMPREHENSIVE METABOLIC PANEL
ALT: 21 U/L (ref 0–35)
AST: 17 U/L (ref 0–37)
Alkaline Phosphatase: 58 U/L (ref 39–117)
Creatinine, Ser: 0.7 mg/dL (ref 0.4–1.2)
GFR: 93.16 mL/min (ref >60.00)
Total Bilirubin: 0.7 mg/dL (ref 0.3–1.2)

## 2013-06-14 LAB — MICROALBUMIN / CREATININE URINE RATIO
Creatinine,U: 58.5 mg/dL
Microalb Creat Ratio: 1.4 mg/g (ref 0.0–30.0)
Microalb, Ur: 0.8 mg/dL (ref 0.0–1.9)

## 2013-06-14 LAB — LIPID PANEL: Total CHOL/HDL Ratio: 4

## 2013-06-14 MED ORDER — PROMETHAZINE HCL 25 MG PO TABS: 25.0000 mg | ORAL_TABLET | Freq: Three times a day (TID) | ORAL | Status: DC | PRN

## 2013-06-14 MED ORDER — ALPRAZOLAM 0.25 MG PO TABS: 0.2500 mg | ORAL_TABLET | Freq: Two times a day (BID) | ORAL | Status: DC | PRN

## 2013-06-14 NOTE — Assessment & Plan Note (Signed)
Well-controlled on diet alone.   hemoglobin A1c has improved from 6.3 to 6.1 . Fasting glucose is 117.  She is reminded to have  eye exams and her foot exam is normal. Normal urine microalbumin to creatinine ratio at next visit. He is on the appropriate medications.

## 2013-06-14 NOTE — Progress Notes (Signed)
Patient ID: Katie Huber, female   DOB: 01/28/1951, 62 y.o.   MRN: [id number]  Patient Active Problem List   Diagnosis Date Noted  . 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 Diabetes mellitus type 2 in obese 03/02/2013  . Other and unspecified hyperlipidemia 12/17/2011  . Encounter for long-term (current) use of other medications 12/17/2011  . Cervicalgia 12/16/2011  . Sleep apnea 12/16/2011  . Obesity, Class III, BMI 40-49.9 (morbid obesity)     Subjective:  CC:   Chief Complaint  Patient presents with  . Follow-up    HPI:   Katie Huber a 62 y.o. female who presents for follow up on newly diagnosed diabetes mellitus with aqc of 6.3 in April 2014, hyperlipidemia, hypertension and obesity. She has lost 30 lbs since April intentionally with dietary modifications and exercise (walking).  Not taking any medications .  No recent lows.    Past Medical History  Diagnosis Date  . Asthma 1990  . Obesity, Class III, BMI 40-49.9 (morbid obesity)     weight fluctuations of 100 lbs     Past Surgical History  Procedure Laterality Date  . Joint replacement      Bilateral  . Abdominal hysterectomy  2001  . Stomach surgery  [phone]    gastric partitionin( for obesity)   . Cholecystectomy  1970s       The following portions of the patient's history were reviewed and updated as appropriate: Allergies, current medications, and problem list.    Review of Systems:   12 Pt  review of systems was negative except those addressed in the HPI,     History   Social History  . Marital Status: Single    Spouse Name: N/A    Number of Children: N/A  . Years of Education: N/A   Occupational History  . Not on file.   Social History Main Topics  . Smoking status: Never Smoker   . Smokeless tobacco: Not on file  . Alcohol Use: Not on file  . Drug Use: Not on file  . Sexually Active: Not on file   Other Topics Concern  . Not on file   Social History Narrative  . No  narrative on file    Objective:  BP 158/78  Pulse 67  Temp(Src) 97.7 F (36.5 C) (Oral)  Resp 14  Wt 330 lb 8 oz (149.914 kg)  BMI 51.75 kg/m2  SpO2 97%  General appearance: alert, cooperative and appears stated age Ears: normal TM's and external ear canals both ears Throat: lips, mucosa, and tongue normal; teeth and gums normal Neck: no adenopathy, no carotid bruit, supple, symmetrical, trachea midline and thyroid not enlarged, symmetric, no tenderness/mass/nodules Back: symmetric, no curvature. ROM normal. No CVA tenderness. Lungs: clear to auscultation bilaterally Heart: regular rate and rhythm, S1, S2 normal, no murmur, click, rub or gallop Abdomen: soft, non-tender; bowel sounds normal; no masses,  no organomegaly Pulses: 2+ and symmetric Skin: Skin color, texture, turgor normal. No rashes or lesions Lymph nodes: Cervical, supraclavicular, and axillary nodes normal. Foot exam:  Nails are well trimmed,  No callouses,  Sensation intact to microfilament  Assessment and Plan:  Sleep apnea Using CPAP   Diabetes mellitus type 2 in obese Well-controlled on diet alone.   hemoglobin A1c has improved from 6.3 to 6.1 . Fasting glucose is 117.  She is reminded to have  eye exams and her foot exam is normal. Normal urine microalbumin to creatinine ratio at next visit. He  is on the appropriate medications.  Obesity, Class III, BMI 40-49.9 (morbid obesity) With wt loss of 30 lbs in 3 months, encouragement and guidance given.  Low GI diet described   Updated Medication List Outpatient Encounter Prescriptions as of 06/14/2013  Medication Sig Dispense Refill  . amLODipine (NORVASC) 5 MG tablet Take 1 tablet (5 mg total) by mouth daily.  90 tablet  1  . fexofenadine-pseudoephedrine (ALLEGRA-D 24 HOUR) 180-240 MG per 24 hr tablet Take 1 tablet by mouth daily.  30 tablet  0  . olopatadine (PATANOL) 0.1 % ophthalmic solution Place 1 drop into the left eye 2 (two) times daily.  5 mL  12  .  omeprazole (PRILOSEC) 20 MG capsule Take 1 capsule (20 mg total) by mouth 2 (two) times daily.  180 capsule  1  . PARoxetine (PAXIL) 20 MG tablet Take 1 tablet (20 mg total) by mouth every morning.  90 tablet  1  . Probiotic Product (PROBIOTIC DAILY PO) Take by mouth daily.      [name] triamterene-hydrochlorothiazide (MAXZIDE-25) 37.5-25 MG per tablet Take 1 each (1 tablet total) by mouth daily.  90 tablet  1  . albuterol (PROVENTIL HFA;VENTOLIN HFA) 108 (90 BASE) MCG/ACT inhaler Inhale 2 puffs into the lungs every 6 (six) hours as needed for wheezing.  3 Inhaler  3  . ALPRAZolam (XANAX) 0.25 MG tablet Take 1 tablet (0.25 mg total) by mouth 2 (two) times daily as needed for sleep.  20 tablet  0  . chlorpheniramine-HYDROcodone (TUSSIONEX) 10-8 MG/5ML LQCR Take 5 mLs by mouth at bedtime as needed.  240 mL  0  . ciprofloxacin (CILOXAN) 0.3 % ophthalmic solution Place 1 drop into the left eye every 2 (two) hours. Administer 1 drop, every 2 hours, while awake, for 2 days. Then 1 drop, every 4 hours, while awake, for the next 5 days.  5 mL  0  . Fluticasone-Salmeterol (ADVAIR DISKUS) 250-50 MCG/DOSE AEPB Inhale 1 puff into the lungs 2 (two) times daily.  180 each  3  . levofloxacin (LEVAQUIN) 500 MG tablet Take 1 tablet (500 mg total) by mouth daily.  7 tablet  0  . promethazine (PHENERGAN) 25 MG tablet Take 1 tablet (25 mg total) by mouth every 8 (eight) hours as needed for nausea.  30 tablet  0  . sulfamethoxazole-trimethoprim (BACTRIM DS,SEPTRA DS) 800-160 MG per tablet Take 1 tablet by mouth 2 (two) times daily.  6 tablet  0   No facility-administered encounter medications on file as of 06/14/2013.     Orders Placed This Encounter  Procedures  . Lipid panel  . Hemoglobin A1c  . Microalbumin / creatinine urine ratio  . Comprehensive metabolic panel  . HM DIABETES FOOT EXAM    No Follow-up on file.

## 2013-06-14 NOTE — Assessment & Plan Note (Signed)
Using CPAP 

## 2013-06-14 NOTE — Assessment & Plan Note (Signed)
With wt loss of 30 lbs in 3 months, encouragement and guidance given.  Low GI diet described

## 2013-06-14 NOTE — Patient Instructions (Addendum)
You are doing well !!  Keep up the great work on the weight loss! (add walking when the weight starts to plateau)  Trial of alprazolam for nighttime insomnnia  Try the low carb pita breads as a substitue pita for hummous ([employer],  [employer] and [employer]  We will notify you of your labs via Mychart or phone   Return in 3 months

## 2013-06-16 ENCOUNTER — Encounter: Payer: Self-pay | Admitting: *Deleted

## 2013-07-01 ENCOUNTER — Telehealth: Payer: Self-pay | Admitting: Internal Medicine

## 2013-07-01 NOTE — Telephone Encounter (Signed)
Needing a refill on all her maintenance medication. For 90 days.

## 2013-07-01 NOTE — Telephone Encounter (Signed)
Called patient for clarification on medication left voicemail to return call.

## 2013-07-04 NOTE — Telephone Encounter (Signed)
Left message for patient to return my call.

## 2013-07-05 NOTE — Telephone Encounter (Signed)
Left message for patient to contact pharmacy  

## 2013-07-28 ENCOUNTER — Other Ambulatory Visit: Payer: Self-pay | Admitting: Internal Medicine

## 2013-08-05 ENCOUNTER — Telehealth: Payer: Self-pay | Admitting: Internal Medicine

## 2013-08-05 MED ORDER — AMLODIPINE BESYLATE 5 MG PO TABS: 5.0000 mg | ORAL_TABLET | Freq: Every day | ORAL | Status: DC

## 2013-08-05 MED ORDER — OMEPRAZOLE 20 MG PO CPDR: DELAYED_RELEASE_CAPSULE | ORAL | Status: DC

## 2013-08-05 MED ORDER — TRIAMTERENE-HCTZ 37.5-25 MG PO TABS: ORAL_TABLET | ORAL | Status: DC

## 2013-08-05 MED ORDER — PAROXETINE HCL 20 MG PO TABS: 20.0000 mg | ORAL_TABLET | ORAL | Status: DC

## 2013-08-05 NOTE — Telephone Encounter (Signed)
Rx sent to [facility]

## 2013-08-05 NOTE — Telephone Encounter (Signed)
[facility] she has spoken and went online with them and they told her that they have sent request for her triamterene, Norvasc, Prilosec, and paxil. She does have enough to last for another week. She states they told her they have sent the request 3 times.

## 2013-09-19 ENCOUNTER — Encounter: Payer: Self-pay | Admitting: *Deleted

## 2013-09-20 ENCOUNTER — Encounter: Payer: Self-pay | Admitting: Internal Medicine

## 2013-09-20 ENCOUNTER — Ambulatory Visit (INDEPENDENT_AMBULATORY_CARE_PROVIDER_SITE_OTHER): Payer: BC Managed Care – PPO | Admitting: Internal Medicine

## 2013-09-20 VITALS — BP 142/82 | HR 60 | Temp 97.8°F | Resp 14 | Wt 324.2 lb

## 2013-09-20 DIAGNOSIS — E1169 Type 2 diabetes mellitus with other specified complication: Secondary | ICD-10-CM

## 2013-09-20 DIAGNOSIS — F489 Nonpsychotic mental disorder, unspecified: Secondary | ICD-10-CM

## 2013-09-20 DIAGNOSIS — E669 Obesity, unspecified: Secondary | ICD-10-CM

## 2013-09-20 DIAGNOSIS — Z1239 Encounter for other screening for malignant neoplasm of breast: Secondary | ICD-10-CM

## 2013-09-20 DIAGNOSIS — F409 Phobic anxiety disorder, unspecified: Secondary | ICD-10-CM

## 2013-09-20 DIAGNOSIS — Z9884 Bariatric surgery status: Secondary | ICD-10-CM | POA: Insufficient documentation

## 2013-09-20 DIAGNOSIS — E785 Hyperlipidemia, unspecified: Secondary | ICD-10-CM

## 2013-09-20 DIAGNOSIS — E119 Type 2 diabetes mellitus without complications: Secondary | ICD-10-CM

## 2013-09-20 DIAGNOSIS — Z23 Encounter for immunization: Secondary | ICD-10-CM

## 2013-09-20 LAB — COMPREHENSIVE METABOLIC PANEL
ALT: 18 U/L (ref 0–35)
Albumin: 4 g/dL (ref 3.5–5.2)
CO2: 28 mEq/L (ref 19–32)
Chloride: 101 mEq/L (ref 96–112)
GFR: 105.51 mL/min (ref >60.00)
Glucose, Bld: 123 mg/dL — ABNORMAL HIGH (ref 70–99)
Potassium: 4 mEq/L (ref 3.5–5.1)
Sodium: 138 mEq/L (ref 135–145)
Total Protein: 7.1 g/dL (ref 6.0–8.3)

## 2013-09-20 LAB — LDL CHOLESTEROL, DIRECT: Direct LDL: 134.1 mg/dL

## 2013-09-20 LAB — HEMOGLOBIN A1C: Hgb A1c MFr Bld: 5.9 % (ref 4.6–6.5)

## 2013-09-20 NOTE — Assessment & Plan Note (Signed)
Recommended trial of benadryl and adding regular daily morning exercise

## 2013-09-20 NOTE — Assessment & Plan Note (Signed)
Losing weight steadily,  37 lb since April. patient encouraged to continue current diet and add exercise.

## 2013-09-20 NOTE — Assessment & Plan Note (Signed)
Well-controlled on diet alone.   hemoglobin A1c is due  Fasting glucose is 117.  She is reminded to have  eye exams and her foot exam is normal. Normal urine microalbumin to creatinine ratio at next visit.

## 2013-09-20 NOTE — Assessment & Plan Note (Signed)
Last LDL was 124.  Repeat due. Given new onset DM, will recommend statin if LDL > 100

## 2013-09-20 NOTE — Progress Notes (Signed)
Patient ID: Katie Huber, female   DOB: June 20, 1951, 62 y.o.   MRN: [id number]   Patient Active Problem List   Diagnosis Date Noted  . 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 Diabetes mellitus type 2 in obese 03/02/2013  . Other and unspecified hyperlipidemia 12/17/2011  . Encounter for long-term (current) use of other medications 12/17/2011  . Cervicalgia 12/16/2011  . Sleep apnea 12/16/2011  . Obesity, Class III, BMI 40-49.9 (morbid obesity)     Subjective:  CC:   Chief Complaint  Patient presents with  . Diabetes    follow up    HPI:   Katie Huber a 62 y.o. female who presents 3 month follow up diabetes mellitus type 2, diet controlled,  Morbid obesity, and hypertension.  She continues to suffer from chronic insomnia, marked by frequent wakenings which cause her to feel very tired by noon.  She is using her CPAP,  Follows all sleep hygiene rules.  Does not want to use alprazolam or any other controlled substance    Has lost 37 lbs thus far by avoiding complex carbohydrates , mainly bread pasta rice and potatoes   Past Medical History  Diagnosis Date  . Asthma 1990  . Obesity, Class III, BMI 40-49.9 (morbid obesity)     weight fluctuations of 100 lbs     Past Surgical History  Procedure Laterality Date  . Joint replacement      Bilateral  . Abdominal hysterectomy  2001  . Stomach surgery  [phone]    gastric partitionin( for obesity)   . Cholecystectomy  1970s       The following portions of the patient's history were reviewed and updated as appropriate: Allergies, current medications, and problem list.    Review of Systems:   12 Pt  review of systems was negative except those addressed in the HPI,     History   Social History  . Marital Status: Single    Spouse Name: N/A    Number of Children: N/A  . Years of Education: N/A   Occupational History  . Not on file.   Social History Main Topics  . Smoking status: Never Smoker   .  Smokeless tobacco: Not on file  . Alcohol Use: Not on file  . Drug Use: Not on file  . Sexual Activity: Not Currently   Other Topics Concern  . Not on file   Social History Narrative  . No narrative on file    Objective:  Filed Vitals:   09/20/13 0938  BP: 142/82  Pulse: 60  Temp: 97.8 F (36.6 C)  Resp: 14     General appearance: alert, cooperative and appears stated age Ears: normal TM's and external ear canals both ears Throat: lips, mucosa, and tongue normal; teeth and gums normal Neck: no adenopathy, no carotid bruit, supple, symmetrical, trachea midline and thyroid not enlarged, symmetric, no tenderness/mass/nodules Back: symmetric, no curvature. ROM normal. No CVA tenderness. Lungs: clear to auscultation bilaterally Heart: regular rate and rhythm, S1, S2 normal, no murmur, click, rub or gallop Abdomen: soft, non-tender; bowel sounds normal; no masses,  no organomegaly Pulses: 2+ and symmetric Skin: Skin color, texture, turgor normal. No rashes or lesions Lymph nodes: Cervical, supraclavicular, and axillary nodes normal. Foot exam:  Nails are well trimmed,  No callouses,  Sensation intact to microfilament   Assessment and Plan:  Diabetes mellitus type 2 in obese Well-controlled on diet alone.   hemoglobin A1c is due  Fasting glucose is  117.  She is reminded to have  eye exams and her foot exam is normal. Normal urine microalbumin to creatinine ratio at next visit.    Insomnia due to anxiety and fear Recommended trial of benadryl and adding regular daily morning exercise  Other and unspecified hyperlipidemia Last LDL was 124.  Repeat due. Given new onset DM, will recommend statin if LDL > 100  Obesity, Class III, BMI 40-49.9 (morbid obesity) Losing weight steadily,  37 lb since April. patient encouraged to continue current diet and add exercise.    Updated Medication List Outpatient Encounter Prescriptions as of 09/20/2013  Medication Sig Dispense Refill   . amLODipine (NORVASC) 5 MG tablet Take 1 tablet (5 mg total) by mouth daily.  90 tablet  0  . fexofenadine-pseudoephedrine (ALLEGRA-D 24 HOUR) 180-240 MG per 24 hr tablet Take 1 tablet by mouth daily.  30 tablet  0  . omeprazole (PRILOSEC) 20 MG capsule TAKE 1 CAPSULE TWICE DAILY  180 capsule  0  . PARoxetine (PAXIL) 20 MG tablet Take 1 tablet (20 mg total) by mouth every morning.  90 tablet  0  . Probiotic Product (PROBIOTIC DAILY PO) Take by mouth daily.      . promethazine (PHENERGAN) 25 MG tablet Take 1 tablet (25 mg total) by mouth every 8 (eight) hours as needed for nausea.  30 tablet  0  . triamterene-hydrochlorothiazide (MAXZIDE-25) 37.5-25 MG per tablet TAKE 1 TABLET DAILY  90 tablet  0  . albuterol (PROVENTIL HFA;VENTOLIN HFA) 108 (90 BASE) MCG/ACT inhaler Inhale 2 puffs into the lungs every 6 (six) hours as needed for wheezing.  3 Inhaler  3  . [DISCONTINUED] ALPRAZolam (XANAX) 0.25 MG tablet Take 1 tablet (0.25 mg total) by mouth 2 (two) times daily as needed for sleep.  20 tablet  0  . [DISCONTINUED] chlorpheniramine-HYDROcodone (TUSSIONEX) 10-8 MG/5ML LQCR Take 5 mLs by mouth at bedtime as needed.  240 mL  0  . [DISCONTINUED] ciprofloxacin (CILOXAN) 0.3 % ophthalmic solution Place 1 drop into the left eye every 2 (two) hours. Administer 1 drop, every 2 hours, while awake, for 2 days. Then 1 drop, every 4 hours, while awake, for the next 5 days.  5 mL  0  . [DISCONTINUED] Fluticasone-Salmeterol (ADVAIR DISKUS) 250-50 MCG/DOSE AEPB Inhale 1 puff into the lungs 2 (two) times daily.  180 each  3  . [DISCONTINUED] levofloxacin (LEVAQUIN) 500 MG tablet Take 1 tablet (500 mg total) by mouth daily.  7 tablet  0  . [DISCONTINUED] olopatadine (PATANOL) 0.1 % ophthalmic solution Place 1 drop into the left eye 2 (two) times daily.  5 mL  12  . [DISCONTINUED] sulfamethoxazole-trimethoprim (BACTRIM DS,SEPTRA DS) 800-160 MG per tablet Take 1 tablet by mouth 2 (two) times daily.  6 tablet  0   No  facility-administered encounter medications on file as of 09/20/2013.     Orders Placed This Encounter  Procedures  . Fecal occult blood, imunochemical  . MM Digital Screening  . Tdap vaccine greater than or equal to 7yo IM  . Lipid panel  . Hemoglobin A1c  . Comprehensive metabolic panel  . HM DIABETES EYE EXAM    Return in about 3 months (around 12/21/2013).

## 2013-09-20 NOTE — Patient Instructions (Signed)
You have lost 37 lbs since April!!!!  Great work  Return in 3 months  Mammogram scheduled

## 2013-09-22 ENCOUNTER — Encounter: Payer: Self-pay | Admitting: Internal Medicine

## 2013-09-26 LAB — FECAL OCCULT BLOOD, GUAIAC: FECAL OCCULT BLD: NEGATIVE

## 2013-09-28 ENCOUNTER — Encounter: Payer: Self-pay | Admitting: Internal Medicine

## 2013-09-28 ENCOUNTER — Other Ambulatory Visit (INDEPENDENT_AMBULATORY_CARE_PROVIDER_SITE_OTHER): Payer: BC Managed Care – PPO

## 2013-09-28 DIAGNOSIS — E119 Type 2 diabetes mellitus without complications: Secondary | ICD-10-CM

## 2013-09-28 LAB — FECAL OCCULT BLOOD, IMMUNOCHEMICAL: Fecal Occult Bld: NEGATIVE

## 2013-10-06 ENCOUNTER — Other Ambulatory Visit: Payer: Self-pay

## 2013-10-26 ENCOUNTER — Other Ambulatory Visit: Payer: Self-pay | Admitting: Internal Medicine

## 2013-11-26 LAB — HM MAMMOGRAPHY: HM Mammogram: NORMAL

## 2013-12-08 ENCOUNTER — Ambulatory Visit: Payer: Self-pay | Admitting: Internal Medicine

## 2013-12-09 LAB — FECAL OCCULT BLOOD, GUAIAC: Fecal Occult Blood: NEGATIVE

## 2013-12-14 LAB — HM DIABETES EYE EXAM

## 2013-12-14 LAB — HM MAMMOGRAPHY: HM MAMMO: NORMAL

## 2013-12-20 ENCOUNTER — Ambulatory Visit: Payer: BC Managed Care – PPO | Admitting: Internal Medicine

## 2013-12-27 ENCOUNTER — Encounter: Payer: Self-pay | Admitting: Internal Medicine

## 2013-12-27 ENCOUNTER — Ambulatory Visit (INDEPENDENT_AMBULATORY_CARE_PROVIDER_SITE_OTHER): Payer: BC Managed Care – PPO | Admitting: Internal Medicine

## 2013-12-27 VITALS — BP 142/80 | HR 76 | Temp 97.8°F | Ht 67.0 in | Wt 305.0 lb

## 2013-12-27 DIAGNOSIS — E669 Obesity, unspecified: Secondary | ICD-10-CM

## 2013-12-27 DIAGNOSIS — I10 Essential (primary) hypertension: Secondary | ICD-10-CM | POA: Insufficient documentation

## 2013-12-27 DIAGNOSIS — E119 Type 2 diabetes mellitus without complications: Secondary | ICD-10-CM

## 2013-12-27 DIAGNOSIS — E1169 Type 2 diabetes mellitus with other specified complication: Secondary | ICD-10-CM

## 2013-12-27 DIAGNOSIS — E785 Hyperlipidemia, unspecified: Secondary | ICD-10-CM

## 2013-12-27 LAB — COMPREHENSIVE METABOLIC PANEL
ALT: 51 U/L — ABNORMAL HIGH (ref 0–35)
AST: 39 U/L — AB (ref 0–37)
Albumin: 4.1 g/dL (ref 3.5–5.2)
Alkaline Phosphatase: 63 U/L (ref 39–117)
BUN: 18 mg/dL (ref 6–23)
CALCIUM: 10 mg/dL (ref 8.4–10.5)
CHLORIDE: 100 meq/L (ref 96–112)
CO2: 29 mEq/L (ref 19–32)
CREATININE: 0.7 mg/dL (ref 0.4–1.2)
GFR: 93 mL/min (ref >60.00)
GLUCOSE: 104 mg/dL — AB (ref 70–99)
Potassium: 3.9 mEq/L (ref 3.5–5.1)
Sodium: 136 mEq/L (ref 135–145)
Total Bilirubin: 0.8 mg/dL (ref 0.3–1.2)
Total Protein: 7.4 g/dL (ref 6.0–8.3)

## 2013-12-27 LAB — HM DIABETES FOOT EXAM: HM Diabetic Foot Exam: NORMAL

## 2013-12-27 LAB — HEMOGLOBIN A1C: Hgb A1c MFr Bld: 5.7 % (ref 4.6–6.5)

## 2013-12-27 LAB — LDL CHOLESTEROL, DIRECT: LDL DIRECT: 132.3 mg/dL

## 2013-12-27 NOTE — Assessment & Plan Note (Signed)
Well-controlled on diet alone.   hemoglobin A1c is due  Fasting glucose is 117.  She is again  reminded to have  eye exams and her foot exam is normal. Normal urine microalbumin to creatinine ratio at next visit.   Lab Results  Component Value Date   HGBA1C 5.7 12/27/2013   Lab Results  Component Value Date   MICROALBUR 0.8 06/14/2013

## 2013-12-27 NOTE — Progress Notes (Signed)
Patient ID: Katie Huber, female   DOB: 1951/07/19, 63 y.o.   MRN: [id number]  Patient Active Problem List   Diagnosis Date Noted  . Essential hypertension, benign 12/27/2013  . S/P bariatric surgery 09/20/2013  . 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 Diabetes mellitus type 2 in obese 03/02/2013  . Other and unspecified hyperlipidemia 12/17/2011  . Encounter for long-term (current) use of other medications 12/17/2011  . Cervicalgia 12/16/2011  . Sleep apnea 12/16/2011  . Obesity, Class III, BMI 40-49.9 (morbid obesity)     Subjective:  CC:   Chief Complaint  Patient presents with  . Follow-up    3 mths    HPI:   Katie Huber a 63 y.o. female who presents for follow up on diet controlled diabetes , morbid obesity, hyperlipidemia and sleep apnea.  She has had an intentional Wt loss of 56 lbs since April diagnosis of DM.  She is following a low glycemic index diet.  She is not exercising yet but has purchased a bicycle to start using in her [place]   Past Medical History  Diagnosis Date  . Asthma 1990  . Obesity, Class III, BMI 40-49.9 (morbid obesity)     weight fluctuations of 100 lbs     Past Surgical History  Procedure Laterality Date  . Joint replacement      Bilateral  . Abdominal hysterectomy  2001  . Stomach surgery  [phone]    gastric partitionin( for obesity)   . Cholecystectomy  1970s       The following portions of the patient's history were reviewed and updated as appropriate: Allergies, current medications, and problem list.    Review of Systems:   12 Pt  review of systems was negative except those addressed in the HPI,     History   Social History  . Marital Status: Single    Spouse Name: N/A    Number of Children: N/A  . Years of Education: N/A   Occupational History  . Not on file.   Social History Main Topics  . Smoking status: Never Smoker   . Smokeless tobacco: Not on file  . Alcohol Use: Not on file  . Drug  Use: Not on file  . Sexual Activity: Not Currently   Other Topics Concern  . Not on file   Social History Narrative  . No narrative on file    Objective:  Filed Vitals:   12/27/13 1447  BP: 142/80  Pulse: 76  Temp: 97.8 F (36.6 C)     General appearance: alert, cooperative and appears stated age Ears: normal TM's and external ear canals both ears Throat: lips, mucosa, and tongue normal; teeth and gums normal Neck: no adenopathy, no carotid bruit, supple, symmetrical, trachea midline and thyroid not enlarged, symmetric, no tenderness/mass/nodules Back: symmetric, no curvature. ROM normal. No CVA tenderness. Lungs: clear to auscultation bilaterally Heart: regular rate and rhythm, S1, S2 normal, no murmur, click, rub or gallop Abdomen: soft, non-tender; bowel sounds normal; no masses,  no organomegaly Pulses: 2+ and symmetric Skin: Skin color, texture, turgor normal. No rashes or lesions Lymph nodes: Cervical, supraclavicular, and axillary nodes normal.  Assessment and Plan:  Diabetes mellitus type 2 in obese Well-controlled on diet alone.   hemoglobin A1c is due  Fasting glucose is 117.  She is again  reminded to have  eye exams and her foot exam is normal. Normal urine microalbumin to creatinine ratio at next visit.   Lab Results  Component Value Date   HGBA1C 5.7 12/27/2013   Lab Results  Component Value Date   MICROALBUR 0.8 06/14/2013        Obesity, Class III, BMI 40-49.9 (morbid obesity) I have congratulated her in reduction of   BMI and encouraged  Continued weight loss with goal of 10% of body weigh over the next 6 months using a low glycemic index diet and regular exercise a minimum of 5 days per week.    Other and unspecified hyperlipidemia New ACC guidelines recommend starting patients aged 55 or higher on moderate intensity statin therapy for diabetes and concurrent LDL between 70-189.  I hav e recommended statin therapy for LDL 132  Lab Results   Component Value Date   CHOL 208* 09/20/2013   HDL 58.90 09/20/2013   LDLCALC 124* 06/14/2013   LDLDIRECT 132.3 12/27/2013   TRIG 134.0 09/20/2013   CHOLHDL 4 09/20/2013    Essential hypertension, benign Well controlled on current regimen. Renal function stable, no changes today.  Lab Results  Component Value Date   CREATININE 0.7 12/27/2013    Lab Results  Component Value Date   NA 136 12/27/2013   K 3.9 12/27/2013   CL 100 12/27/2013   CO2 29 12/27/2013    Updated Medication List Outpatient Encounter Prescriptions as of 12/27/2013  Medication Sig  . albuterol (PROVENTIL HFA;VENTOLIN HFA) 108 (90 BASE) MCG/ACT inhaler Inhale 2 puffs into the lungs every 6 (six) hours as needed for wheezing.  [name] amLODipine (NORVASC) 5 MG tablet TAKE 1 TABLET DAILY  . cholecalciferol (VITAMIN D) 1000 UNITS tablet Take 1,000 Units by mouth daily.  . Omega-3 Fatty Acids (FISH OIL) 1000 MG CAPS Take 1 capsule by mouth daily.  [name] omeprazole (PRILOSEC) 20 MG capsule TAKE 1 CAPSULE TWICE DAILY  . PARoxetine (PAXIL) 20 MG tablet TAKE 1 TABLET EVERY MORNING  . promethazine (PHENERGAN) 25 MG tablet Take 1 tablet (25 mg total) by mouth every 8 (eight) hours as needed for nausea.  [name] triamterene-hydrochlorothiazide (MAXZIDE-25) 37.5-25 MG per tablet TAKE 1 TABLET DAILY  . [DISCONTINUED] fexofenadine-pseudoephedrine (ALLEGRA-D 24 HOUR) 180-240 MG per 24 hr tablet Take 1 tablet by mouth daily.  . [DISCONTINUED] Probiotic Product (PROBIOTIC DAILY PO) Take by mouth daily.     Orders Placed This Encounter  Procedures  . [facility] MAMMOGRAPHY  . Fecal Occult Blood, Guaiac  . Hemoglobin A1c  . Comprehensive metabolic panel  . LDL cholesterol, direct  . HM DIABETES FOOT EXAM    No Follow-up on file.

## 2013-12-27 NOTE — Assessment & Plan Note (Signed)
New ACC guidelines recommend starting patients aged 63 or higher on moderate intensity statin therapy for diabetes and concurrent LDL between 70-189.  I hav e recommended statin therapy for LDL 132  Lab Results  Component Value Date   CHOL 208* 09/20/2013   HDL 58.90 09/20/2013   LDLCALC 124* 06/14/2013   LDLDIRECT 132.3 12/27/2013   TRIG 134.0 09/20/2013   CHOLHDL 4 09/20/2013

## 2013-12-27 NOTE — Assessment & Plan Note (Signed)
I have congratulated her in reduction of   BMI and encouraged  Continued weight loss with goal of 10% of body weigh over the next 6 months using a low glycemic index diet and regular exercise a minimum of 5 days per week.    

## 2013-12-27 NOTE — Assessment & Plan Note (Signed)
Well controlled on current regimen. Renal function stable, no changes today.  Lab Results  Component Value Date   CREATININE 0.7 12/27/2013    Lab Results  Component Value Date   NA 136 12/27/2013   K 3.9 12/27/2013   CL 100 12/27/2013   CO2 29 12/27/2013

## 2013-12-27 NOTE — Progress Notes (Signed)
Pre visit review using our clinic review tool, if applicable. No additional management support is needed unless otherwise documented below in the visit note. 

## 2013-12-29 ENCOUNTER — Other Ambulatory Visit: Payer: Self-pay | Admitting: Internal Medicine

## 2013-12-29 ENCOUNTER — Encounter: Payer: Self-pay | Admitting: Internal Medicine

## 2013-12-29 DIAGNOSIS — R945 Abnormal results of liver function studies: Secondary | ICD-10-CM

## 2013-12-29 DIAGNOSIS — R7989 Other specified abnormal findings of blood chemistry: Secondary | ICD-10-CM

## 2013-12-30 ENCOUNTER — Telehealth: Payer: Self-pay

## 2013-12-30 ENCOUNTER — Encounter: Payer: Self-pay | Admitting: Internal Medicine

## 2013-12-30 NOTE — Telephone Encounter (Signed)
Relevant patient education assigned to patient using Emmi. ° °

## 2014-01-05 ENCOUNTER — Telehealth: Payer: Self-pay | Admitting: Internal Medicine

## 2014-01-05 NOTE — Telephone Encounter (Signed)
Relevant patient education assigned to patient using Emmi. ° °

## 2014-01-10 ENCOUNTER — Telehealth: Payer: Self-pay | Admitting: *Deleted

## 2014-01-10 MED ORDER — TRIAMTERENE-HCTZ 37.5-25 MG PO TABS: ORAL_TABLET | ORAL | Status: DC

## 2014-01-10 MED ORDER — PAROXETINE HCL 20 MG PO TABS: ORAL_TABLET | ORAL | Status: DC

## 2014-01-10 MED ORDER — OMEPRAZOLE 20 MG PO CPDR: DELAYED_RELEASE_CAPSULE | ORAL | Status: DC

## 2014-01-10 MED ORDER — AMLODIPINE BESYLATE 5 MG PO TABS: ORAL_TABLET | ORAL | Status: DC

## 2014-01-10 NOTE — Telephone Encounter (Signed)
[facility] I have sent all other requested but need approval for this one patient pharmacy change.

## 2014-01-10 NOTE — Telephone Encounter (Signed)
Ok to refill,  Refill sent  

## 2014-02-03 ENCOUNTER — Other Ambulatory Visit (INDEPENDENT_AMBULATORY_CARE_PROVIDER_SITE_OTHER): Payer: BC Managed Care – PPO

## 2014-02-03 DIAGNOSIS — R7989 Other specified abnormal findings of blood chemistry: Secondary | ICD-10-CM

## 2014-02-03 DIAGNOSIS — R945 Abnormal results of liver function studies: Secondary | ICD-10-CM

## 2014-02-03 LAB — IRON AND TIBC
%SAT: 28 % (ref 20–55)
IRON: 103 ug/dL (ref 42–145)
TIBC: 363 ug/dL (ref 250–470)
UIBC: 260 ug/dL (ref 125–400)

## 2014-02-03 LAB — FERRITIN: Ferritin: 53.1 ng/mL (ref 10.0–291.0)

## 2014-02-03 LAB — HEPATIC FUNCTION PANEL
ALT: 18 U/L (ref 0–35)
AST: 18 U/L (ref 0–37)
Albumin: 4.1 g/dL (ref 3.5–5.2)
Alkaline Phosphatase: 59 U/L (ref 39–117)
BILIRUBIN DIRECT: 0.1 mg/dL (ref 0.0–0.3)
BILIRUBIN TOTAL: 0.7 mg/dL (ref 0.3–1.2)
Total Protein: 7.1 g/dL (ref 6.0–8.3)

## 2014-02-04 LAB — HEPATITIS C ANTIBODY: HCV Ab: NEGATIVE

## 2014-02-04 LAB — HEPATITIS B SURFACE ANTIBODY,QUALITATIVE: HEP B S AB: NEGATIVE

## 2014-02-04 LAB — HEPATITIS B CORE ANTIBODY, TOTAL: HEP B C TOTAL AB: NONREACTIVE

## 2014-02-04 LAB — HEPATITIS B SURFACE ANTIGEN: HEP B S AG: NEGATIVE

## 2014-02-05 ENCOUNTER — Encounter: Payer: Self-pay | Admitting: Internal Medicine

## 2014-02-06 LAB — ANA: Anti Nuclear Antibody(ANA): NEGATIVE

## 2014-02-06 LAB — ANTI-SMITH ANTIBODY: ENA SM Ab Ser-aCnc: <1

## 2014-02-07 ENCOUNTER — Encounter: Payer: Self-pay | Admitting: Internal Medicine

## 2014-02-09 NOTE — Telephone Encounter (Signed)
Unread mychart message mailed to patient 

## 2014-03-14 LAB — HM DIABETES EYE EXAM

## 2014-03-18 ENCOUNTER — Other Ambulatory Visit: Payer: Self-pay | Admitting: Internal Medicine

## 2014-06-06 ENCOUNTER — Other Ambulatory Visit: Payer: BC Managed Care – PPO

## 2014-06-08 ENCOUNTER — Other Ambulatory Visit: Payer: Self-pay | Admitting: Internal Medicine

## 2014-06-08 ENCOUNTER — Other Ambulatory Visit (INDEPENDENT_AMBULATORY_CARE_PROVIDER_SITE_OTHER): Payer: BC Managed Care – PPO

## 2014-06-08 DIAGNOSIS — K7689 Other specified diseases of liver: Secondary | ICD-10-CM

## 2014-06-08 DIAGNOSIS — E119 Type 2 diabetes mellitus without complications: Secondary | ICD-10-CM

## 2014-06-08 DIAGNOSIS — E669 Obesity, unspecified: Secondary | ICD-10-CM

## 2014-06-08 DIAGNOSIS — I10 Essential (primary) hypertension: Secondary | ICD-10-CM

## 2014-06-08 DIAGNOSIS — R5381 Other malaise: Secondary | ICD-10-CM

## 2014-06-08 DIAGNOSIS — R5383 Other fatigue: Secondary | ICD-10-CM

## 2014-06-08 DIAGNOSIS — Z79899 Other long term (current) drug therapy: Secondary | ICD-10-CM

## 2014-06-08 DIAGNOSIS — E1169 Type 2 diabetes mellitus with other specified complication: Secondary | ICD-10-CM

## 2014-06-08 DIAGNOSIS — E785 Hyperlipidemia, unspecified: Secondary | ICD-10-CM

## 2014-06-08 DIAGNOSIS — K760 Fatty (change of) liver, not elsewhere classified: Secondary | ICD-10-CM

## 2014-06-08 LAB — COMPREHENSIVE METABOLIC PANEL
ALK PHOS: 60 U/L (ref 39–117)
ALT: 19 U/L (ref 0–35)
AST: 20 U/L (ref 0–37)
Albumin: 4 g/dL (ref 3.5–5.2)
BILIRUBIN TOTAL: 0.7 mg/dL (ref 0.2–1.2)
BUN: 18 mg/dL (ref 6–23)
CO2: 28 mEq/L (ref 19–32)
Calcium: 9.9 mg/dL (ref 8.4–10.5)
Chloride: 100 mEq/L (ref 96–112)
Creatinine, Ser: 0.6 mg/dL (ref 0.4–1.2)
GFR: 103.31 mL/min (ref >60.00)
Glucose, Bld: 113 mg/dL — ABNORMAL HIGH (ref 70–99)
Potassium: 4.1 mEq/L (ref 3.5–5.1)
SODIUM: 136 meq/L (ref 135–145)
TOTAL PROTEIN: 7 g/dL (ref 6.0–8.3)

## 2014-06-08 LAB — CBC WITH DIFFERENTIAL/PLATELET
BASOS PCT: 0.5 % (ref 0.0–3.0)
Basophils Absolute: 0 10*3/uL (ref 0.0–0.1)
EOS PCT: 2.7 % (ref 0.0–5.0)
Eosinophils Absolute: 0.1 10*3/uL (ref 0.0–0.7)
HEMATOCRIT: 42.7 % (ref 36.0–46.0)
HEMOGLOBIN: 14.5 g/dL (ref 12.0–15.0)
Lymphocytes Relative: 38.9 % (ref 12.0–46.0)
Lymphs Abs: 1.9 10*3/uL (ref 0.7–4.0)
MCHC: 34 g/dL (ref 30.0–36.0)
MCV: 94.8 fl (ref 78.0–100.0)
MONO ABS: 0.4 10*3/uL (ref 0.1–1.0)
MONOS PCT: 7.4 % (ref 3.0–12.0)
NEUTROS ABS: 2.5 10*3/uL (ref 1.4–7.7)
Neutrophils Relative %: 50.5 % (ref 43.0–77.0)
PLATELETS: 230 10*3/uL (ref 150.0–400.0)
RBC: 4.5 Mil/uL (ref 3.87–5.11)
RDW: 13.9 % (ref 11.5–15.5)
WBC: 4.9 10*3/uL (ref 4.0–10.5)

## 2014-06-08 LAB — LIPID PANEL
CHOL/HDL RATIO: 4
Cholesterol: 229 mg/dL — ABNORMAL HIGH (ref 0–200)
HDL: 64.4 mg/dL (ref >39.00)
LDL Cholesterol: 149 mg/dL — ABNORMAL HIGH (ref 0–99)
NonHDL: 164.6
TRIGLYCERIDES: 80 mg/dL (ref 0.0–149.0)
VLDL: 16 mg/dL (ref 0.0–40.0)

## 2014-06-08 LAB — TSH: TSH: 1.08 u[IU]/mL (ref 0.35–4.50)

## 2014-06-08 LAB — HEMOGLOBIN A1C: Hgb A1c MFr Bld: 5.6 % (ref 4.6–6.5)

## 2014-06-13 ENCOUNTER — Ambulatory Visit (INDEPENDENT_AMBULATORY_CARE_PROVIDER_SITE_OTHER): Payer: BC Managed Care – PPO | Admitting: Internal Medicine

## 2014-06-13 ENCOUNTER — Encounter: Payer: Self-pay | Admitting: Internal Medicine

## 2014-06-13 VITALS — BP 150/82 | HR 66 | Temp 98.5°F | Resp 20 | Ht 67.0 in | Wt 292.5 lb

## 2014-06-13 DIAGNOSIS — Z23 Encounter for immunization: Secondary | ICD-10-CM

## 2014-06-13 DIAGNOSIS — Z0000 Encounter for general adult medical examination without abnormal findings: Secondary | ICD-10-CM | POA: Insufficient documentation

## 2014-06-13 DIAGNOSIS — E785 Hyperlipidemia, unspecified: Secondary | ICD-10-CM

## 2014-06-13 DIAGNOSIS — E669 Obesity, unspecified: Secondary | ICD-10-CM

## 2014-06-13 DIAGNOSIS — E1169 Type 2 diabetes mellitus with other specified complication: Secondary | ICD-10-CM

## 2014-06-13 DIAGNOSIS — E119 Type 2 diabetes mellitus without complications: Secondary | ICD-10-CM

## 2014-06-13 DIAGNOSIS — Z90710 Acquired absence of both cervix and uterus: Secondary | ICD-10-CM | POA: Insufficient documentation

## 2014-06-13 LAB — HM DIABETES FOOT EXAM: HM Diabetic Foot Exam: NORMAL

## 2014-06-13 MED ORDER — AMLODIPINE BESYLATE 5 MG PO TABS: ORAL_TABLET | ORAL | Status: DC

## 2014-06-13 MED ORDER — OMEPRAZOLE 20 MG PO CPDR: DELAYED_RELEASE_CAPSULE | ORAL | Status: DC

## 2014-06-13 MED ORDER — TRIAMTERENE-HCTZ 37.5-25 MG PO TABS: ORAL_TABLET | ORAL | Status: DC

## 2014-06-13 MED ORDER — PAROXETINE HCL 20 MG PO TABS: ORAL_TABLET | ORAL | Status: DC

## 2014-06-13 NOTE — Patient Instructions (Addendum)
Here are several low carb "staples"  On my diet that will help you manage the constipation caused by the low carb diet   Dream[place]'s  Pasta  very high fiber  ([employer], [employer],  [employer],  [place])   Joseph's brand pita bread and flat bread   ([employer] and [employer])  Mission brand whole wheat low carb tortilla: has  26 g fiber and 6 net carbs   ([employer])   Atkins bars  Wasa crackers  I will see you again in 6 months, and we'll ive you the new pneumonia vaccine then  You received the Shingles vaccine today Health Maintenance, Female A healthy lifestyle and preventative care can promote health and wellness.  Maintain regular health, dental, and eye exams.  Eat a healthy diet. Foods like vegetables, fruits, whole grains, low-fat dairy products, and lean protein foods contain the nutrients you need without too many calories. Decrease your intake of foods high in solid fats, added sugars, and salt. Get information about a proper diet from your caregiver, if necessary.  Regular physical exercise is one of the most important things you can do for your health. Most adults should get at least 150 minutes of moderate-intensity exercise (any activity that increases your heart rate and causes you to sweat) each week. In addition, most adults need muscle-strengthening exercises on 2 or more days a week.   Maintain a healthy weight. The body mass index (BMI) is a screening tool to identify possible weight problems. It provides an estimate of body fat based on height and weight. Your caregiver can help determine your BMI, and can help you achieve or maintain a healthy weight. For adults 20 years and older:  A BMI below 18.5 is considered underweight.  A BMI of 18.5 to 24.9 is normal.  A BMI of 25 to 29.9 is considered overweight.  A BMI of 30 and above is considered obese.  Maintain normal blood lipids and cholesterol by exercising and minimizing your intake of saturated fat. Eat a balanced diet with  plenty of fruits and vegetables. Blood tests for lipids and cholesterol should begin at age 79 and be repeated every 5 years. If your lipid or cholesterol levels are high, you are over 50, or you are a high risk for heart disease, you may need your cholesterol levels checked more frequently.Ongoing high lipid and cholesterol levels should be treated with medicines if diet and exercise are not effective.  If you smoke, find out from your caregiver how to quit. If you do not use tobacco, do not start.  Lung cancer screening is recommended for adults aged 54-80 years who are at high risk for developing lung cancer because of a history of smoking. Yearly low-dose computed tomography (CT) is recommended for people who have at least a 30-pack-year history of smoking and are a current smoker or have quit within the past 15 years. A pack year of smoking is smoking an average of 1 pack of cigarettes a day for 1 year (for example: 1 pack a day for 30 years or 2 packs a day for 15 years). Yearly screening should continue until the smoker has stopped smoking for at least 15 years. Yearly screening should also be stopped for people who develop a health problem that would prevent them from having lung cancer treatment.  If you are pregnant, do not drink alcohol. If you are breastfeeding, be very cautious about drinking alcohol. If you are not pregnant and choose to drink alcohol, do  not exceed 1 drink per day. One drink is considered to be 12 ounces (355 mL) of beer, 5 ounces (148 mL) of wine, or 1.5 ounces (44 mL) of liquor.  Avoid use of street drugs. Do not share needles with anyone. Ask for help if you need support or instructions about stopping the use of drugs.  High blood pressure causes heart disease and increases the risk of stroke. Blood pressure should be checked at least every 1 to 2 years. Ongoing high blood pressure should be treated with medicines, if weight loss and exercise are not effective.  If you  are 94 to 63 years old, ask your caregiver if you should take aspirin to prevent strokes.  Diabetes screening involves taking a blood sample to check your fasting blood sugar level. This should be done once every 3 years, after age 52, if you are within normal weight and without risk factors for diabetes. Testing should be considered at a younger age or be carried out more frequently if you are overweight and have at least 1 risk factor for diabetes.  Breast cancer screening is essential preventative care for women. You should practice "breast self-awareness." This means understanding the normal appearance and feel of your breasts and may include breast self-examination. Any changes detected, no matter how small, should be reported to a caregiver. Women in their 37s and 30s should have a clinical breast exam (CBE) by a caregiver as part of a regular health exam every 1 to 3 years. After age 73, women should have a CBE every year. Starting at age 65, women should consider having a mammogram (breast X-ray) every year. Women who have a family history of breast cancer should talk to their caregiver about genetic screening. Women at a high risk of breast cancer should talk to their caregiver about having an MRI and a mammogram every year.  Breast cancer gene (BRCA)-related cancer risk assessment is recommended for women who have family members with BRCA-related cancers. BRCA-related cancers include breast, ovarian, tubal, and peritoneal cancers. Having family members with these cancers may be associated with an increased risk for harmful changes (mutations) in the breast cancer genes BRCA1 and BRCA2. Results of the assessment will determine the need for genetic counseling and BRCA1 and BRCA2 testing.  The Pap test is a screening test for cervical cancer. Women should have a Pap test starting at age 36. Between ages 13 and 41, Pap tests should be repeated every 2 years. Beginning at age 28, you should have a Pap  test every 3 years as long as the past 3 Pap tests have been normal. If you had a hysterectomy for a problem that was not cancer or a condition that could lead to cancer, then you no longer need Pap tests. If you are between ages 82 and 44, and you have had normal Pap tests going back 10 years, you no longer need Pap tests. If you have had past treatment for cervical cancer or a condition that could lead to cancer, you need Pap tests and screening for cancer for at least 20 years after your treatment. If Pap tests have been discontinued, risk factors (such as a new sexual partner) need to be reassessed to determine if screening should be resumed. Some women have medical problems that increase the chance of getting cervical cancer. In these cases, your caregiver may recommend more frequent screening and Pap tests.  The human papillomavirus (HPV) test is an additional test that may be used for  cervical cancer screening. The HPV test looks for the virus that can cause the cell changes on the cervix. The cells collected during the Pap test can be tested for HPV. The HPV test could be used to screen women aged 49 years and older, and should be used in women of any age who have unclear Pap test results. After the age of 87, women should have HPV testing at the same frequency as a Pap test.  Colorectal cancer can be detected and often prevented. Most routine colorectal cancer screening begins at the age of 44 and continues through age 46. However, your caregiver may recommend screening at an earlier age if you have risk factors for colon cancer. On a yearly basis, your caregiver may provide home test kits to check for hidden blood in the stool. Use of a small camera at the end of a tube, to directly examine the colon (sigmoidoscopy or colonoscopy), can detect the earliest forms of colorectal cancer. Talk to your caregiver about this at age 39, when routine screening begins. Direct examination of the colon should be  repeated every 5 to 10 years through age 52, unless early forms of pre-cancerous polyps or small growths are found.  Hepatitis C blood testing is recommended for all people born from [birth year] through 1965 and any individual with known risks for hepatitis C.  Practice safe sex. Use condoms and avoid high-risk sexual practices to reduce the spread of sexually transmitted infections (STIs). Sexually active women aged 64 and younger should be checked for Chlamydia, which is a common sexually transmitted infection. Older women with new or multiple partners should also be tested for Chlamydia. Testing for other STIs is recommended if you are sexually active and at increased risk.  Osteoporosis is a disease in which the bones lose minerals and strength with aging. This can result in serious bone fractures. The risk of osteoporosis can be identified using a bone density scan. Women ages 33 and over and women at risk for fractures or osteoporosis should discuss screening with their caregivers. Ask your caregiver whether you should be taking a calcium supplement or vitamin D to reduce the rate of osteoporosis.  Menopause can be associated with physical symptoms and risks. Hormone replacement therapy is available to decrease symptoms and risks. You should talk to your caregiver about whether hormone replacement therapy is right for you.  Use sunscreen. Apply sunscreen liberally and repeatedly throughout the day. You should seek shade when your shadow is shorter than you. Protect yourself by wearing long sleeves, pants, a wide-brimmed hat, and sunglasses year round, whenever you are outdoors.  Notify your caregiver of new moles or changes in moles, especially if there is a change in shape or color. Also notify your caregiver if a mole is larger than the size of a pencil eraser.  Stay current with your immunizations. Document Released: 06/02/2011 Document Revised: 03/14/2013 Document Reviewed: 10/19/2013 [facility]  Patient Information 2015 [place], [place]. This information is not intended to replace advice given to you by your health care provider. Make sure you discuss any questions you have with your health care provider.

## 2014-06-13 NOTE — Progress Notes (Signed)
Patient ID: Katie Huber, female   DOB: July 05, 1951, 63 y.o.   MRN: [id number]  Subjective:     Katie Huber is a 63 y.o. female and is here for a comprehensive physical exam. The patient reports no problems.  History   Social History  . Marital Status: Single    Spouse Name: N/A    Number of Children: N/A  . Years of Education: N/A   Occupational History  . Not on file.   Social History Main Topics  . Smoking status: Never Smoker   . Smokeless tobacco: Not on file  . Alcohol Use: Not on file  . Drug Use: Not on file  . Sexual Activity: Not Currently   Other Topics Concern  . Not on file   Social History Narrative  . No narrative on file   Health Maintenance  Topic Date Due  . Pap Smear  05/16/1969  . Colonoscopy  05/16/2001  . Zostavax  05/17/2011  . Influenza Vaccine  07/01/2014  . Mammogram  12/09/2015  . Tetanus/tdap  09/21/2023    The following portions of the patient's history were reviewed and updated as appropriate: allergies, current medications, past family history, past medical history, past social history, past surgical history and problem list.  Review of Systems A comprehensive review of systems was negative.   Objective:   BP 150/82  Pulse 66  Temp(Src) 98.5 F (36.9 C) (Oral)  Resp 20  Ht 5\' 7"  (1.702 m)  Wt 292 lb 8 oz (132.677 kg)  BMI 45.80 kg/m2  SpO2 98%  General appearance: alert, cooperative and appears stated age Ears: normal TM's and external ear canals both ears Throat: lips, mucosa, and tongue normal; teeth and gums normal Neck: no adenopathy, no carotid bruit, supple, symmetrical, trachea midline and thyroid not enlarged, symmetric, no tenderness/mass/nodules Back: symmetric, no curvature. ROM normal. No CVA tenderness. Lungs: clear to auscultation bilaterally Heart: regular rate and rhythm, S1, S2 normal, no murmur, click, rub or gallop Abdomen: soft, non-tender; bowel sounds normal; no masses,  no organomegaly Pulses: 2+  and symmetric Skin: Skin color, texture, turgor normal. No rashes or lesions Lymph nodes: Cervical, supraclavicular, and axillary nodes normal. Foot exam:  Nails are well trimmed,  No callouses,  Sensation intact to microfilament    Assessment and plan:   S/P vaginal hysterectomy Vaginal PAPs were normal for  The last 15 years  And were finally discontinued   Obesity, Class III, BMI 40-49.9 (morbid obesity) I have congratulated her in reduction of   BMI with the loss of 70 lbs thus far and encouraged  Continued weight loss with goal of 10% of body weigh over the next 6 months using a low glycemic index diet and regular exercise a minimum of 5 days per week.    Diabetes mellitus type 2 in obese  Historically well-controlled on diet alone .  hemoglobin A1c has been consistently at or  less than 7.0 . Patient is up-to-date on eye exams and foot exam is normal today. Patient has no microalbuminuria. Patient is tolerating statin therapy for CAD risk reduction and on ACE/ARB for renal protection and hypertension   Lab Results  Component Value Date   HGBA1C 5.6 06/08/2014   Lab Results  Component Value Date   MICROALBUR 0.8 06/14/2013          Other and unspecified hyperlipidemia New ACC guidelines recommend starting patients aged 63 or higher on moderate intensity statin therapy for diabetes and concurrent LDL between 70-189.  I hav e recommended statin therapy for LDL > 100 but she is not interested Lab Results  Component Value Date   CHOL 229* 06/08/2014   HDL 64.40 06/08/2014   LDLCALC 149* 06/08/2014   LDLDIRECT 132.3 12/27/2013   TRIG 80.0 06/08/2014   CHOLHDL 4 06/08/2014      Encounter for preventive health examination Annual comprehensive exam was done excluding breast, pelvic and PAP smear.She performs her own breast exams monthly and has deferred breast exam.  All screenings have been addressed .    Updated Medication List Outpatient Encounter Prescriptions as of 06/13/2014   Medication Sig  . amLODipine (NORVASC) 5 MG tablet TAKE 1 TABLET DAILY  . cholecalciferol (VITAMIN D) 1000 UNITS tablet Take 1,000 Units by mouth daily.  . Omega-3 Fatty Acids (FISH OIL) 1000 MG CAPS Take 1 capsule by mouth daily.  [name] omeprazole (PRILOSEC) 20 MG capsule TAKE 1 CAPSULE TWICE DAILY  . PARoxetine (PAXIL) 20 MG tablet TAKE 1 TABLET EVERY MORNING  . promethazine (PHENERGAN) 25 MG tablet Take 1 tablet (25 mg total) by mouth every 8 (eight) hours as needed for nausea.  [name] triamterene-hydrochlorothiazide (MAXZIDE-25) 37.5-25 MG per tablet TAKE 1 TABLET DAILY  . [DISCONTINUED] amLODipine (NORVASC) 5 MG tablet TAKE 1 TABLET DAILY  . [DISCONTINUED] omeprazole (PRILOSEC) 20 MG capsule TAKE 1 CAPSULE TWICE DAILY  . [DISCONTINUED] PARoxetine (PAXIL) 20 MG tablet TAKE 1 TABLET EVERY MORNING  . [DISCONTINUED] triamterene-hydrochlorothiazide (MAXZIDE-25) 37.5-25 MG per tablet TAKE 1 TABLET DAILY  . [DISCONTINUED] albuterol (PROVENTIL HFA;VENTOLIN HFA) 108 (90 BASE) MCG/ACT inhaler Inhale 2 puffs into the lungs every 6 (six) hours as needed for wheezing.

## 2014-06-13 NOTE — Progress Notes (Signed)
Pre-visit discussion using our clinic review tool. No additional management support is needed unless otherwise documented below in the visit note.  

## 2014-06-13 NOTE — Assessment & Plan Note (Signed)
Vaginal PAPs were normal for  The last 15 years  And were finally discontinued

## 2014-06-14 NOTE — Assessment & Plan Note (Signed)
I have congratulated her in reduction of   BMI with the loss of 70 lbs thus far and encouraged  Continued weight loss with goal of 10% of body weigh over the next 6 months using a low glycemic index diet and regular exercise a minimum of 5 days per week.     

## 2014-06-14 NOTE — Assessment & Plan Note (Signed)
Annual comprehensive exam was done excluding breast, pelvic and PAP smear.She performs her own breast exams monthly and has deferred breast exam.  All screenings have been addressed .

## 2014-06-14 NOTE — Assessment & Plan Note (Signed)
Historically well-controlled on diet alone .  hemoglobin A1c has been consistently at or  less than 7.0 . Patient is up-to-date on eye exams and foot exam is normal today. Patient has no microalbuminuria. Patient is tolerating statin therapy for CAD risk reduction and on ACE/ARB for renal protection and hypertension   Lab Results  Component Value Date   HGBA1C 5.6 06/08/2014   Lab Results  Component Value Date   MICROALBUR 0.8 06/14/2013

## 2014-06-14 NOTE — Assessment & Plan Note (Signed)
New ACC guidelines recommend starting patients aged 63 or higher on moderate intensity statin therapy for diabetes and concurrent LDL between 70-189.  I hav e recommended statin therapy for LDL > 100 but she is not interested Lab Results  Component Value Date   CHOL 229* 06/08/2014   HDL 64.40 06/08/2014   LDLCALC 149* 06/08/2014   LDLDIRECT 132.3 12/27/2013   TRIG 80.0 06/08/2014   CHOLHDL 4 06/08/2014

## 2014-06-29 ENCOUNTER — Encounter: Payer: BC Managed Care – PPO | Admitting: Internal Medicine

## 2014-07-10 ENCOUNTER — Encounter: Payer: BC Managed Care – PPO | Admitting: Internal Medicine

## 2014-10-02 ENCOUNTER — Telehealth: Payer: Self-pay

## 2014-10-02 MED ORDER — AMLODIPINE BESYLATE 5 MG PO TABS: ORAL_TABLET | ORAL | Status: DC

## 2014-10-02 MED ORDER — PAROXETINE HCL 20 MG PO TABS: ORAL_TABLET | ORAL | Status: DC

## 2014-10-02 MED ORDER — OMEPRAZOLE 20 MG PO CPDR: DELAYED_RELEASE_CAPSULE | ORAL | Status: DC

## 2014-10-02 MED ORDER — TRIAMTERENE-HCTZ 37.5-25 MG PO TABS: ORAL_TABLET | ORAL | Status: DC

## 2014-10-02 NOTE — Telephone Encounter (Signed)
The patient has changed insurance companies and needs her prescriptions sent to [place] on [address].  [employer] they were sent to [facility])

## 2014-10-02 NOTE — Telephone Encounter (Signed)
Rx sent to pharmacy by [facility]  

## 2014-12-14 ENCOUNTER — Ambulatory Visit: Payer: BC Managed Care – PPO | Admitting: Internal Medicine

## 2015-01-15 ENCOUNTER — Ambulatory Visit: Payer: Self-pay | Admitting: Internal Medicine

## 2015-01-19 ENCOUNTER — Telehealth: Payer: Self-pay | Admitting: Internal Medicine

## 2015-01-19 DIAGNOSIS — E119 Type 2 diabetes mellitus without complications: Secondary | ICD-10-CM

## 2015-01-19 NOTE — Telephone Encounter (Signed)
Needing fasting labs draw before appointment on 3.8.16.

## 2015-01-22 NOTE — Telephone Encounter (Signed)
Ordered

## 2015-01-23 NOTE — Telephone Encounter (Signed)
Left detailed message on pts VM to schedule lab appoint

## 2015-01-25 ENCOUNTER — Other Ambulatory Visit: Payer: Self-pay | Admitting: Internal Medicine

## 2015-01-25 ENCOUNTER — Telehealth: Payer: Self-pay | Admitting: Internal Medicine

## 2015-01-25 MED ORDER — AMLODIPINE BESYLATE 5 MG PO TABS: ORAL_TABLET | ORAL | Status: DC

## 2015-01-25 MED ORDER — PAROXETINE HCL 20 MG PO TABS: ORAL_TABLET | ORAL | Status: DC

## 2015-01-25 NOTE — Telephone Encounter (Signed)
Pharmacy faxed for 90 day supply patient mail order [facility] 90 day supply with refill sent 10/02/14 to [facility]

## 2015-01-25 NOTE — Telephone Encounter (Signed)
90 day supply authorized and [facility]

## 2015-01-25 NOTE — Telephone Encounter (Signed)
Patient called for refill on Maxzide but was refilled on 10/02/14 90 day supply,  Tried to contact patient to ask why and inform patient she has refills awaiting at pharmacy no answer left voicemail to call office.

## 2015-01-26 ENCOUNTER — Telehealth: Payer: Self-pay | Admitting: Internal Medicine

## 2015-01-26 MED ORDER — TRIAMTERENE-HCTZ 37.5-25 MG PO TABS: ORAL_TABLET | ORAL | Status: DC

## 2015-01-26 NOTE — Telephone Encounter (Signed)
Patient called and stated she did not have enough Maxzide to last until script could come from [facility]- script patient ask for a 14 day supply to be sent to [facility] sent as requested.

## 2015-01-29 ENCOUNTER — Other Ambulatory Visit: Payer: Self-pay | Admitting: *Deleted

## 2015-01-29 MED ORDER — OMEPRAZOLE 20 MG PO CPDR: DELAYED_RELEASE_CAPSULE | ORAL | Status: DC

## 2015-01-30 ENCOUNTER — Other Ambulatory Visit (INDEPENDENT_AMBULATORY_CARE_PROVIDER_SITE_OTHER): Payer: BLUE CROSS/BLUE SHIELD

## 2015-01-30 DIAGNOSIS — E119 Type 2 diabetes mellitus without complications: Secondary | ICD-10-CM

## 2015-01-30 LAB — COMPREHENSIVE METABOLIC PANEL
ALBUMIN: 4.1 g/dL (ref 3.5–5.2)
ALT: 16 U/L (ref 0–35)
AST: 19 U/L (ref 0–37)
Alkaline Phosphatase: 58 U/L (ref 39–117)
BUN: 14 mg/dL (ref 6–23)
CALCIUM: 9.9 mg/dL (ref 8.4–10.5)
CO2: 32 meq/L (ref 19–32)
Chloride: 101 mEq/L (ref 96–112)
Creatinine, Ser: 0.59 mg/dL (ref 0.40–1.20)
GFR: 109.17 mL/min (ref >60.00)
GLUCOSE: 123 mg/dL — AB (ref 70–99)
POTASSIUM: 3.8 meq/L (ref 3.5–5.1)
Sodium: 138 mEq/L (ref 135–145)
TOTAL PROTEIN: 7 g/dL (ref 6.0–8.3)
Total Bilirubin: 0.7 mg/dL (ref 0.2–1.2)

## 2015-01-30 LAB — MICROALBUMIN / CREATININE URINE RATIO
Creatinine,U: 139.9 mg/dL
Microalb Creat Ratio: 2.4 mg/g (ref 0.0–30.0)
Microalb, Ur: 3.4 mg/dL — ABNORMAL HIGH (ref 0.0–1.9)

## 2015-01-30 LAB — LIPID PANEL
Cholesterol: 228 mg/dL — ABNORMAL HIGH (ref 0–200)
HDL: 72.4 mg/dL (ref >39.00)
LDL Cholesterol: 137 mg/dL — ABNORMAL HIGH (ref 0–99)
NONHDL: 155.6
Total CHOL/HDL Ratio: 3
Triglycerides: 91 mg/dL (ref 0.0–149.0)
VLDL: 18.2 mg/dL (ref 0.0–40.0)

## 2015-01-30 LAB — HEMOGLOBIN A1C: HEMOGLOBIN A1C: 5.7 % (ref 4.6–6.5)

## 2015-02-01 ENCOUNTER — Encounter: Payer: Self-pay | Admitting: Internal Medicine

## 2015-02-03 LAB — COLOGUARD: COLOGUARD: NEGATIVE

## 2015-02-06 ENCOUNTER — Ambulatory Visit (INDEPENDENT_AMBULATORY_CARE_PROVIDER_SITE_OTHER): Payer: BLUE CROSS/BLUE SHIELD | Admitting: Internal Medicine

## 2015-02-06 ENCOUNTER — Encounter: Payer: Self-pay | Admitting: Internal Medicine

## 2015-02-06 VITALS — BP 148/78 | HR 59 | Temp 97.8°F | Resp 16 | Ht 67.0 in | Wt 298.5 lb

## 2015-02-06 DIAGNOSIS — I272 Pulmonary hypertension, unspecified: Secondary | ICD-10-CM

## 2015-02-06 DIAGNOSIS — Z23 Encounter for immunization: Secondary | ICD-10-CM

## 2015-02-06 DIAGNOSIS — E1169 Type 2 diabetes mellitus with other specified complication: Secondary | ICD-10-CM

## 2015-02-06 DIAGNOSIS — G4730 Sleep apnea, unspecified: Secondary | ICD-10-CM

## 2015-02-06 DIAGNOSIS — I270 Primary pulmonary hypertension: Secondary | ICD-10-CM

## 2015-02-06 DIAGNOSIS — E119 Type 2 diabetes mellitus without complications: Secondary | ICD-10-CM

## 2015-02-06 DIAGNOSIS — I10 Essential (primary) hypertension: Secondary | ICD-10-CM

## 2015-02-06 DIAGNOSIS — E669 Obesity, unspecified: Secondary | ICD-10-CM

## 2015-02-06 MED ORDER — FUROSEMIDE 20 MG PO TABS: 20.0000 mg | ORAL_TABLET | Freq: Every day | ORAL | Status: DC

## 2015-02-06 MED ORDER — POTASSIUM CHLORIDE CRYS ER 20 MEQ PO TBCR: 20.0000 meq | EXTENDED_RELEASE_TABLET | Freq: Every day | ORAL | Status: DC

## 2015-02-06 MED ORDER — OMEPRAZOLE 20 MG PO CPDR: DELAYED_RELEASE_CAPSULE | ORAL | Status: DC

## 2015-02-06 MED ORDER — PNEUMOCOCCAL 13-VAL CONJ VACC IM SUSP: 0.5000 mL | Freq: Once | INTRAMUSCULAR | Status: AC

## 2015-02-06 MED ORDER — TRIAMTERENE-HCTZ 37.5-25 MG PO TABS: ORAL_TABLET | ORAL | Status: DC

## 2015-02-06 NOTE — Progress Notes (Signed)
Pre-visit discussion using our clinic review tool. No additional management support is needed unless otherwise documented below in the visit note.  

## 2015-02-06 NOTE — Patient Instructions (Addendum)
You are doing well!  I sent the lasix and KCL [facility]  Congratulations on retirement!  Try [employer] Protein Shakes in chocolate flavor:  3 carbs  30 g protein 160 cal   No mixing required    You received the Prevnar vaccine today  See you in 6 months

## 2015-02-06 NOTE — Progress Notes (Signed)
Patient ID: Katie Huber, female   DOB: 08-13-1951, 64 y.o.   MRN: [id number]  Patient Active Problem List   Diagnosis Date Noted  . Pulmonary hypertension 02/06/2015  . S/P vaginal hysterectomy 06/13/2014  . Encounter for preventive health examination 06/13/2014  . Essential hypertension, benign 12/27/2013  . S/P bariatric surgery 09/20/2013  . 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 Diabetes mellitus type 2 in obese 03/02/2013  . Other and unspecified hyperlipidemia 12/17/2011  . Encounter for long-term (current) use of other medications 12/17/2011  . Cervicalgia 12/16/2011  . Sleep apnea 12/16/2011  . Obesity, Class III, BMI 40-49.9 (morbid obesity)     Subjective:  CC:   Chief Complaint  Patient presents with  . Follow-up    6 month  . Diabetes    HPI:   Katie Huber is a 64 y.o. female who presents for  6 month follow up on chronic conditions including Type 2 DM, obesity with OSA and pulmonary  Hypertension , and  hypertension.  sHe feels generally well, is walking  several times per week and checking blood sugars once daily at variable times.  BS have been under 130 fasting and < 150 post prandially.  Denies any recent hypoglyemic events.  Taking h eras directed. Following a carbohydrate modified diet 6 days per week. Denies numbness, burning and tingling of extremities. Appetite is good. Has kept off most of the weight she has lost.    She is enjoying retirement.   Past Medical History  Diagnosis Date  . Asthma 1990  . Obesity, Class III, BMI 40-49.9 (morbid obesity)     weight fluctuations of 100 lbs     Past Surgical History  Procedure Laterality Date  . Joint replacement      Bilateral  . Abdominal hysterectomy  2001  . Stomach surgery  [phone]    gastric partitionin( for obesity)   . Cholecystectomy  1970s    . pneumococcal 13-valent conjugate vaccine  0.5 mL Intramuscular Once     The following portions of the patient's history were  reviewed and updated as appropriate: Allergies, current medications, and problem list.    Review of Systems:   Patient denies headache, fevers, malaise, unintentional weight loss, skin rash, eye pain, sinus congestion and sinus pain, sore throat, dysphagia,  hemoptysis , cough, dyspnea, wheezing, chest pain, palpitations, orthopnea, edema, abdominal pain, nausea, melena, diarrhea, constipation, flank pain, dysuria, hematuria, urinary  Frequency, nocturia, numbness, tingling, seizures,  Focal weakness, Loss of consciousness,  Tremor, insomnia, depression, anxiety, and suicidal ideation.     History   Social History  . Marital Status: Single    Spouse Name: N/A  . Number of Children: N/A  . Years of Education: N/A   Occupational History  . Not on file.   Social History Main Topics  . Smoking status: Never Smoker   . Smokeless tobacco: Not on file  . Alcohol Use: Not on file  . Drug Use: Not on file  . Sexual Activity: Not Currently   Other Topics Concern  . Not on file   Social History Narrative    Objective:  Filed Vitals:   02/06/15 1610  BP: 148/78  Pulse: 59  Temp: 97.8 F (36.6 C)  Resp: 16     General appearance: alert, cooperative and appears stated age Ears: normal TM's and external ear canals both ears Throat: lips, mucosa, and tongue normal; teeth and gums normal Neck: no adenopathy, no carotid bruit, supple, symmetrical, trachea  midline and thyroid not enlarged, symmetric, no tenderness/mass/nodules Back: symmetric, no curvature. ROM normal. No CVA tenderness. Lungs: clear to auscultation bilaterally Heart: regular rate and rhythm, S1, S2 normal, no murmur, click, rub or gallop Abdomen: soft, non-tender; bowel sounds normal; no masses,  no organomegaly Pulses: 2+ and symmetric Skin: Skin color, texture, turgor normal. No rashes or lesions Lymph nodes: Cervical, supraclavicular, and axillary nodes normal.  Assessment and Plan:  Essential  hypertension, benign Well controlled on current regimen, but given new onset proteinuria,  wilLL CHANGE AMLODIPINE TO LOSARTAN.[name] Renal function stable, no changes today.  Lab Results  Component Value Date   CREATININE 0.59 01/30/2015   Lab Results  Component Value Date   NA 138 01/30/2015   K 3.8 01/30/2015   CL 101 01/30/2015   CO2 32 01/30/2015      Diabetes mellitus type 2 in obese Historically well-controlled on current medications.  hemoglobin A1c has been consistently at or  less than 7.0 . Patient is up-to-date on eye exams and foot exam is normal today. Patient has mild mircroalbuminuria . [name] Patient is tolerating statin therapy for CAD risk reduction and on ACE/ARB for reduction in proteinuria.    Lab Results  Component Value Date   HGBA1C 5.7 01/30/2015   Lab Results  Component Value Date   MICROALBUR 3.4* 01/30/2015      Obesity, Class III, BMI 40-49.9 (morbid obesity) I have congratulated her in reduction of   BMI with the loss of 70 lbs thus far and encouraged  Continued weight loss with goal of 10% of body weigh over the next 6 months using a low glycemic index diet and regular exercise a minimum of 5 days per week.       Sleep apnea Diagnosed by sleep study. She is wearing her CPAP every night a minimum of 6 hours per night and notes improved daytime wakefulness and decreased fatigue    A total of 25 minutes of face to face time was spent with patient more than half of which was spent in counselling on the above mentioned issues.   Updated Medication List Outpatient Encounter Prescriptions as of 02/06/2015  Medication Sig  . cholecalciferol (VITAMIN D) 1000 UNITS tablet Take 1,000 Units by mouth daily.  . Omega-3 Fatty Acids (FISH OIL) 1000 MG CAPS Take 1 capsule by mouth daily.  [name] omeprazole (PRILOSEC) 20 MG capsule TAKE 1 CAPSULE TWICE DAILY  . PARoxetine (PAXIL) 20 MG tablet TAKE 1 TABLET EVERY MORNING  . promethazine (PHENERGAN) 25 MG tablet Take 1  tablet (25 mg total) by mouth every 8 (eight) hours as needed for nausea.  [name] triamterene-hydrochlorothiazide (MAXZIDE-25) 37.5-25 MG per tablet TAKE 1 TABLET DAILY  . [DISCONTINUED] amLODipine (NORVASC) 5 MG tablet TAKE 1 TABLET DAILY  . [DISCONTINUED] omeprazole (PRILOSEC) 20 MG capsule TAKE 1 CAPSULE TWICE DAILY  . [DISCONTINUED] triamterene-hydrochlorothiazide (MAXZIDE-25) 37.5-25 MG per tablet TAKE 1 TABLET DAILY  . furosemide (LASIX) 20 MG tablet Take 1 tablet (20 mg total) by mouth daily. AS NEEDED FOR FLUID RETENTION  . losartan (COZAAR) 50 MG tablet Take 1 tablet (50 mg total) by mouth daily.  . potassium chloride SA (K-DUR,KLOR-CON) 20 MEQ tablet Take 1 tablet (20 mEq total) by mouth daily. WHEN USING FUROSEMIDE     Orders Placed This Encounter  Procedures  . Pneumococcal conjugate vaccine 13-valent    Return in about 6 months (around 08/09/2015).

## 2015-02-08 ENCOUNTER — Encounter: Payer: Self-pay | Admitting: Internal Medicine

## 2015-02-08 ENCOUNTER — Telehealth: Payer: Self-pay | Admitting: Internal Medicine

## 2015-02-08 MED ORDER — LOSARTAN POTASSIUM 50 MG PO TABS: 50.0000 mg | ORAL_TABLET | Freq: Every day | ORAL | Status: DC

## 2015-02-08 NOTE — Assessment & Plan Note (Signed)
Diagnosed by sleep study. She is wearing her CPAP every night a minimum of 6 hours per night and notes improved daytime wakefulness and decreased fatigue  

## 2015-02-08 NOTE — Assessment & Plan Note (Signed)
Historically well-controlled on current medications.  hemoglobin A1c has been consistently at or  less than 7.0 . Patient is up-to-date on eye exams and foot exam is normal today. Patient has mild mircroalbuminuria . [name] Patient is tolerating statin therapy for CAD risk reduction and on ACE/ARB for reduction in proteinuria.    Lab Results  Component Value Date   HGBA1C 5.7 01/30/2015   Lab Results  Component Value Date   MICROALBUR 3.4* 01/30/2015

## 2015-02-08 NOTE — Assessment & Plan Note (Addendum)
Well controlled on current regimen, but given new onset proteinuria,  wilLL CHANGE AMLODIPINE TO LOSARTAN.[name] Renal function stable, no changes today.  Lab Results  Component Value Date   CREATININE 0.59 01/30/2015   Lab Results  Component Value Date   NA 138 01/30/2015   K 3.8 01/30/2015   CL 101 01/30/2015   CO2 32 01/30/2015

## 2015-02-08 NOTE — Assessment & Plan Note (Signed)
I have congratulated her in reduction of   BMI with the loss of 70 lbs thus far and encouraged  Continued weight loss with goal of 10% of body weigh over the next 6 months using a low glycemic index diet and regular exercise a minimum of 5 days per week.

## 2015-02-08 NOTE — Telephone Encounter (Signed)
See mychart.  

## 2015-05-15 ENCOUNTER — Other Ambulatory Visit: Payer: Self-pay | Admitting: *Deleted

## 2015-05-15 MED ORDER — PROMETHAZINE HCL 25 MG PO TABS: 25.0000 mg | ORAL_TABLET | Freq: Three times a day (TID) | ORAL | Status: DC | PRN

## 2015-05-15 NOTE — Telephone Encounter (Signed)
Refill? Last OV 02/06/15

## 2015-07-08 ENCOUNTER — Other Ambulatory Visit: Payer: Self-pay | Admitting: Internal Medicine

## 2015-08-14 ENCOUNTER — Encounter: Payer: Self-pay | Admitting: Internal Medicine

## 2015-08-14 ENCOUNTER — Ambulatory Visit (INDEPENDENT_AMBULATORY_CARE_PROVIDER_SITE_OTHER): Payer: BLUE CROSS/BLUE SHIELD | Admitting: Internal Medicine

## 2015-08-14 VITALS — BP 160/80 | HR 70 | Temp 98.0°F | Resp 14 | Ht 66.25 in | Wt 302.8 lb

## 2015-08-14 DIAGNOSIS — I10 Essential (primary) hypertension: Secondary | ICD-10-CM | POA: Diagnosis not present

## 2015-08-14 DIAGNOSIS — R7301 Impaired fasting glucose: Secondary | ICD-10-CM

## 2015-08-14 DIAGNOSIS — E785 Hyperlipidemia, unspecified: Secondary | ICD-10-CM | POA: Diagnosis not present

## 2015-08-14 DIAGNOSIS — Z1239 Encounter for other screening for malignant neoplasm of breast: Secondary | ICD-10-CM | POA: Diagnosis not present

## 2015-08-14 DIAGNOSIS — Z0000 Encounter for general adult medical examination without abnormal findings: Secondary | ICD-10-CM | POA: Diagnosis not present

## 2015-08-14 DIAGNOSIS — R739 Hyperglycemia, unspecified: Secondary | ICD-10-CM

## 2015-08-14 DIAGNOSIS — B009 Herpesviral infection, unspecified: Secondary | ICD-10-CM

## 2015-08-14 DIAGNOSIS — D492 Neoplasm of unspecified behavior of bone, soft tissue, and skin: Secondary | ICD-10-CM

## 2015-08-14 DIAGNOSIS — E66813 Obesity, class 3: Secondary | ICD-10-CM

## 2015-08-14 DIAGNOSIS — G4730 Sleep apnea, unspecified: Secondary | ICD-10-CM

## 2015-08-14 DIAGNOSIS — Z113 Encounter for screening for infections with a predominantly sexual mode of transmission: Secondary | ICD-10-CM | POA: Diagnosis not present

## 2015-08-14 NOTE — Patient Instructions (Signed)
Referral to ENT for tongue lesion  Referral to Dermatology for skin lesion on thigh  I want you to lose 10 lbs by your next 6 month follow up  Mammogram ordered   Health Maintenance Adopting a healthy lifestyle and getting preventive care can go a long way to promote health and wellness. Talk with your health care provider about what schedule of regular examinations is right for you. This is a good chance for you to check in with your provider about disease prevention and staying healthy. In between checkups, there are plenty of things you can do on your own. Experts have done a lot of research about which lifestyle changes and preventive measures are most likely to keep you healthy. Ask your health care provider for more information. WEIGHT AND DIET  Eat a healthy diet  Be sure to include plenty of vegetables, fruits, low-fat dairy products, and lean protein.  Do not eat a lot of foods high in solid fats, added sugars, or salt.  Get regular exercise. This is one of the most important things you can do for your health.  Most adults should exercise for at least 150 minutes each week. The exercise should increase your heart rate and make you sweat (moderate-intensity exercise).  Most adults should also do strengthening exercises at least twice a week. This is in addition to the moderate-intensity exercise.  Maintain a healthy weight  Body mass index (BMI) is a measurement that can be used to identify possible weight problems. It estimates body fat based on height and weight. Your health care provider can help determine your BMI and help you achieve or maintain a healthy weight.  For females 28 years of age and older:   A BMI below 18.5 is considered underweight.  A BMI of 18.5 to 24.9 is normal.  A BMI of 25 to 29.9 is considered overweight.  A BMI of 30 and above is considered obese.  Watch levels of cholesterol and blood lipids  You should start having your blood tested for  lipids and cholesterol at 64 years of age, then have this test every 5 years.  You may need to have your cholesterol levels checked more often if:  Your lipid or cholesterol levels are high.  You are older than 64 years of age.  You are at high risk for heart disease.  CANCER SCREENING   Lung Cancer  Lung cancer screening is recommended for adults 109-11 years old who are at high risk for lung cancer because of a history of smoking.  A yearly low-dose CT scan of the lungs is recommended for people who:  Currently smoke.  Have quit within the past 15 years.  Have at least a 30-pack-year history of smoking. A pack year is smoking an average of one pack of cigarettes a day for 1 year.  Yearly screening should continue until it has been 15 years since you quit.  Yearly screening should stop if you develop a health problem that would prevent you from having lung cancer treatment.  Breast Cancer  Practice breast self-awareness. This means understanding how your breasts normally appear and feel.  It also means doing regular breast self-exams. Let your health care provider know about any changes, no matter how small.  If you are in your 20s or 30s, you should have a clinical breast exam (CBE) by a health care provider every 1-3 years as part of a regular health exam.  If you are 4 or older, have a  CBE every year. Also consider having a breast X-ray (mammogram) every year.  If you have a family history of breast cancer, talk to your health care provider about genetic screening.  If you are at high risk for breast cancer, talk to your health care provider about having an MRI and a mammogram every year.  Breast cancer gene (BRCA) assessment is recommended for women who have family members with BRCA-related cancers. BRCA-related cancers include:  Breast.  Ovarian.  Tubal.  Peritoneal cancers.  Results of the assessment will determine the need for genetic counseling and BRCA1  and BRCA2 testing. Cervical Cancer Routine pelvic examinations to screen for cervical cancer are no longer recommended for nonpregnant women who are considered low risk for cancer of the pelvic organs (ovaries, uterus, and vagina) and who do not have symptoms. A pelvic examination may be necessary if you have symptoms including those associated with pelvic infections. Ask your health care provider if a screening pelvic exam is right for you.   The Pap test is the screening test for cervical cancer for women who are considered at risk.  If you had a hysterectomy for a problem that was not cancer or a condition that could lead to cancer, then you no longer need Pap tests.  If you are older than 65 years, and you have had normal Pap tests for the past 10 years, you no longer need to have Pap tests.  If you have had past treatment for cervical cancer or a condition that could lead to cancer, you need Pap tests and screening for cancer for at least 20 years after your treatment.  If you no longer get a Pap test, assess your risk factors if they change (such as having a new sexual partner). This can affect whether you should start being screened again.  Some women have medical problems that increase their chance of getting cervical cancer. If this is the case for you, your health care provider may recommend more frequent screening and Pap tests.  The human papillomavirus (HPV) test is another test that may be used for cervical cancer screening. The HPV test looks for the virus that can cause cell changes in the cervix. The cells collected during the Pap test can be tested for HPV.  The HPV test can be used to screen women 45 years of age and older. Getting tested for HPV can extend the interval between normal Pap tests from three to five years.  An HPV test also should be used to screen women of any age who have unclear Pap test results.  After 64 years of age, women should have HPV testing as often  as Pap tests.  Colorectal Cancer  This type of cancer can be detected and often prevented.  Routine colorectal cancer screening usually begins at 64 years of age and continues through 64 years of age.  Your health care provider may recommend screening at an earlier age if you have risk factors for colon cancer.  Your health care provider may also recommend using home test kits to check for hidden blood in the stool.  A small camera at the end of a tube can be used to examine your colon directly (sigmoidoscopy or colonoscopy). This is done to check for the earliest forms of colorectal cancer.  Routine screening usually begins at age 3.  Direct examination of the colon should be repeated every 5-10 years through 64 years of age. However, you may need to be screened more often  if early forms of precancerous polyps or small growths are found. Skin Cancer  Check your skin from head to toe regularly.  Tell your health care provider about any new moles or changes in moles, especially if there is a change in a mole's shape or color.  Also tell your health care provider if you have a mole that is larger than the size of a pencil eraser.  Always use sunscreen. Apply sunscreen liberally and repeatedly throughout the day.  Protect yourself by wearing long sleeves, pants, a wide-brimmed hat, and sunglasses whenever you are outside. HEART DISEASE, DIABETES, AND HIGH BLOOD PRESSURE   Have your blood pressure checked at least every 1-2 years. High blood pressure causes heart disease and increases the risk of stroke.  If you are between 51 years and 67 years old, ask your health care provider if you should take aspirin to prevent strokes.  Have regular diabetes screenings. This involves taking a blood sample to check your fasting blood sugar level.  If you are at a normal weight and have a low risk for diabetes, have this test once every three years after 64 years of age.  If you are overweight  and have a high risk for diabetes, consider being tested at a younger age or more often. PREVENTING INFECTION  Hepatitis B  If you have a higher risk for hepatitis B, you should be screened for this virus. You are considered at high risk for hepatitis B if:  You were born in a country where hepatitis B is common. Ask your health care provider which countries are considered high risk.  Your parents were born in a high-risk country, and you have not been immunized against hepatitis B (hepatitis B vaccine).  You have HIV or AIDS.  You use needles to inject street drugs.  You live with someone who has hepatitis B.  You have had sex with someone who has hepatitis B.  You get hemodialysis treatment.  You take certain medicines for conditions, including cancer, organ transplantation, and autoimmune conditions. Hepatitis C  Blood testing is recommended for:  Everyone born from [birth year] through 1965.  Anyone with known risk factors for hepatitis C. Sexually transmitted infections (STIs)  You should be screened for sexually transmitted infections (STIs) including gonorrhea and chlamydia if:  You are sexually active and are younger than 64 years of age.  You are older than 64 years of age and your health care provider tells you that you are at risk for this type of infection.  Your sexual activity has changed since you were last screened and you are at an increased risk for chlamydia or gonorrhea. Ask your health care provider if you are at risk.  If you do not have HIV, but are at risk, it may be recommended that you take a prescription medicine daily to prevent HIV infection. This is called pre-exposure prophylaxis (PrEP). You are considered at risk if:  You are sexually active and do not regularly use condoms or know the HIV status of your partner(s).  You take drugs by injection.  You are sexually active with a partner who has HIV. Talk with your health care provider about whether  you are at high risk of being infected with HIV. If you choose to begin PrEP, you should first be tested for HIV. You should then be tested every 3 months for as long as you are taking PrEP.  PREGNANCY   If you are premenopausal and you may become pregnant,  ask your health care provider about preconception counseling.  If you may become pregnant, take 400 to 800 micrograms (mcg) of folic acid every day.  If you want to prevent pregnancy, talk to your health care provider about birth control (contraception). OSTEOPOROSIS AND MENOPAUSE   Osteoporosis is a disease in which the bones lose minerals and strength with aging. This can result in serious bone fractures. Your risk for osteoporosis can be identified using a bone density scan.  If you are 15 years of age or older, or if you are at risk for osteoporosis and fractures, ask your health care provider if you should be screened.  Ask your health care provider whether you should take a calcium or vitamin D supplement to lower your risk for osteoporosis.  Menopause may have certain physical symptoms and risks.  Hormone replacement therapy may reduce some of these symptoms and risks. Talk to your health care provider about whether hormone replacement therapy is right for you.  HOME CARE INSTRUCTIONS   Schedule regular health, dental, and eye exams.  Stay current with your immunizations.   Do not use any tobacco products including cigarettes, chewing tobacco, or electronic cigarettes.  If you are pregnant, do not drink alcohol.  If you are breastfeeding, limit how much and how often you drink alcohol.  Limit alcohol intake to no more than 1 drink per day for nonpregnant women. One drink equals 12 ounces of beer, 5 ounces of wine, or 1 ounces of hard liquor.  Do not use street drugs.  Do not share needles.  Ask your health care provider for help if you need support or information about quitting drugs.  Tell your health care  provider if you often feel depressed.  Tell your health care provider if you have ever been abused or do not feel safe at home. Document Released: 06/02/2011 Document Revised: 04/03/2014 Document Reviewed: 10/19/2013 [facility] Patient Information 2015 [place], [place]. This information is not intended to replace advice given to you by your health care provider. Make sure you discuss any questions you have with your health care provider.

## 2015-08-14 NOTE — Progress Notes (Signed)
Pre-visit discussion using our clinic review tool. No additional management support is needed unless otherwise documented below in the visit note.  

## 2015-08-14 NOTE — Progress Notes (Signed)
Patient ID: Katie Huber, female    DOB: 06-01-1951  Age: 64 y.o. MRN: [id number]  The patient is here for annual  wellness examination and management of other chronic and acute problems.  She has declined breast and pelvic exam and is up to date on screening for breast and cervical CA.    The risk factors are reflected in the social history.  The roster of all physicians providing medical care to patient - is listed in the Snapshot section of the chart.  Home safety : The patient has smoke detectors in the home. They wear seatbelts.  There are no firearms at home. There is no violence in the home.   There is no risks for hepatitis, STDs or HIV and screening is to be done today.  There is no   history of blood transfusion. They have no travel history to infectious disease endemic areas of the world.  The patient has seen their dentist in the last six month. They have seen their eye doctor in the last year. They admit to slight hearing difficulty with regard to whispered voices and some television programs.  They have deferred audiologic testing in the last year.  They do not  have excessive sun exposure. Discussed the need for sun protection: hats, long sleeves and use of sunscreen if there is significant sun exposure.   Diet: the importance of a healthy diet is discussed. They do have a healthy diet.  The benefits of regular aerobic exercise were discussed. She walks 4 times per week ,  20 minutes.   Depression screen: there are no signs or vegative symptoms of depression- irritability, change in appetite, anhedonia, sadness/tearfullness. The following portions of the patient's history were reviewed and updated as appropriate: allergies, current medications, past family history, past medical history,  past surgical history, past social history  and problem list.  Visual acuity was not assessed per patient preference since she has regular follow up with her ophthalmologist. Hearing and body  mass index were assessed and reviewed.   During the course of the visit the patient was educated and counseled about appropriate screening and preventive services including : fall prevention , diabetes screening, nutrition counseling, colorectal cancer screening, and recommended immunizations.    CC: The primary encounter diagnosis was Breast cancer screening. Diagnoses of Screen for STD (sexually transmitted disease), Hyperglycemia, Hyperlipidemia, Impaired fasting glucose, Hyperlipidemia LDL goal <100, Obesity, Class III, BMI 40-49.9 (morbid obesity), Sleep apnea, Essential hypertension, benign, Encounter for preventive health examination, Herpes simplex infection, and Skin neoplasm were also pertinent to this visit.   1) painless persistent macular lesion on left side of tongue x 3 months.  No history of shingles,  Herpes, .   2) Has been woken up 7 times in the last 3 months with the sensation of an electric shock going through her tongue .  Mother had the same symptoms, no clear etiology. Mother had No history of cancer, died of a  Progressive neurologic condition .    3) nonhealing lesion left posterior thigh , present for several months,  Crusts over never heals.   4) Obesity: has gained back 10 lbs this summer  4) Hypertension: taking meds as directed.    History Katie Huber has a past medical history of Asthma (1990) and Obesity, Class III, BMI 40-49.9 (morbid obesity).   She has past surgical history that includes Joint replacement; Abdominal hysterectomy (2001); Stomach surgery (17510); and Cholecystectomy (1970s).   Her family history includes COPD in her  father; Heart disease in her father; Hyperlipidemia in her father; Mental illness in her brother.She reports that she has never smoked. She does not have any smokeless tobacco history on file. Her alcohol and drug histories are not on file.  Outpatient Prescriptions Prior to Visit  Medication Sig Dispense Refill  . cholecalciferol  (VITAMIN D) 1000 UNITS tablet Take 1,000 Units by mouth daily.    . furosemide (LASIX) 20 MG tablet Take 1 tablet (20 mg total) by mouth daily. AS NEEDED FOR FLUID RETENTION 30 tablet 3  . losartan (COZAAR) 50 MG tablet Take 1 tablet (50 mg total) by mouth daily. 90 tablet 3  . Omega-3 Fatty Acids (FISH OIL) 1000 MG CAPS Take 1 capsule by mouth daily.    [name] omeprazole (PRILOSEC) 20 MG capsule TAKE 1 CAPSULE TWICE A DAY 180 capsule 1  . PARoxetine (PAXIL) 20 MG tablet TAKE 1 TABLET EVERY MORNING 90 tablet 2  . potassium chloride SA (K-DUR,KLOR-CON) 20 MEQ tablet Take 1 tablet (20 mEq total) by mouth daily. WHEN USING FUROSEMIDE 30 tablet 3  . promethazine (PHENERGAN) 25 MG tablet Take 1 tablet (25 mg total) by mouth every 8 (eight) hours as needed for nausea. 30 tablet 0  . triamterene-hydrochlorothiazide (MAXZIDE-25) 37.5-25 MG per tablet TAKE 1 TABLET DAILY 90 tablet 2  . omeprazole (PRILOSEC) 20 MG capsule TAKE 1 CAPSULE TWICE DAILY 180 capsule 1   Facility-Administered Medications Prior to Visit  Medication Dose Route Frequency Provider Last Rate Last Dose  . pneumococcal 13-valent conjugate vaccine (PREVNAR 13) injection 0.5 mL  0.5 mL Intramuscular Once [name], MD   0.5 mL at 02/06/15 1730    Review of Systems   Patient denies headache, fevers, malaise, unintentional weight loss, skin rash, eye pain, sinus congestion and sinus pain, sore throat, dysphagia,  hemoptysis , cough, dyspnea, wheezing, chest pain, palpitations, orthopnea, edema, abdominal pain, nausea, melena, diarrhea, constipation, flank pain, dysuria, hematuria, urinary  Frequency, nocturia, numbness, tingling, seizures,  Focal weakness, Loss of consciousness,  Tremor, insomnia, depression, anxiety, and suicidal ideation.      Objective:  BP 160/80 mmHg  Pulse 70  Temp(Src) 98 F (36.7 C) (Oral)  Resp 14  Ht 5' 6.25" (1.683 m)  Wt 302 lb 12 oz (137.326 kg)  BMI 48.48 kg/m2  SpO2 98%  Physical Exam  General  appearance: alert, cooperative and appears stated age Ears: normal TM's and external ear canals both ears Oral:  Macular white reticulated lesion left side of tongue.  lips, mucosa, normal  Neck: no adenopathy, no carotid bruit, supple, symmetrical, trachea midline and thyroid not enlarged, symmetric, no tenderness/mass/nodules Back: symmetric, no curvature. ROM normal. No CVA tenderness. Lungs: clear to auscultation bilaterally Heart: regular rate and rhythm, S1, S2 normal, no murmur, click, rub or gallop Abdomen: soft, non-tender; bowel sounds normal; no masses,  no organomegaly Pulses: 2+ and symmetric Skin: rough papular annular lesion anterior right thigh  Lymph nodes: Cervical, supraclavicular, and axillary nodes normal.   Assessment & Plan:   Problem List Items Addressed This Visit      Unprioritized   Obesity, Class III, BMI 40-49.9 (morbid obesity)    A/p bariatric surgery.  I have addressed  BMI and recommended wt loss of 10% of body weight over the next 6 months using a low glycemic index diet and regular exercise a minimum of 5 days per week.        Sleep apnea    Diagnosed by sleep study. She is  wearing her CPAP every night a minimum of 6 hours per night and notes improved daytime wakefulness and decreased fatigue       Hyperlipidemia LDL goal <100    Based on current lipid profile, the risk of clinically significant CAD is 17% over the next 10 years, using the Framingham risk calculator. The [employer] recommends starting patients aged 82 or higher on moderate intensity statin therapy for LDL between 70-189 and 10 yr risk of CAD > 7.5% ;  and high intensity therapy for anyone with LDL > 190.   Statin therapy has been offered again to patient.  Lab Results  Component Value Date   CHOL 229* 08/14/2015   HDL 78.90 08/14/2015   LDLCALC 134* 08/14/2015   LDLDIRECT 141.0 08/14/2015   TRIG 80.0 08/14/2015   CHOLHDL 3 08/14/2015         Impaired  fasting glucose    hemoglobin A1c has been consistently at or  less than 6.0 . Patient is up-to-date on eye exams and foot exam is normal today. Patient has mild mircroalbuminuria . [name] Patient has deferred statin therapy for CAD risk reduction and on ACE/ARB for reduction in proteinuria.    Lab Results  Component Value Date   HGBA1C 5.6 08/14/2015   Lab Results  Component Value Date   MICROALBUR 3.4* 01/30/2015           Essential hypertension, benign    Elevated today.  Will have patient check BP at work and increase losartan to 100 mg daily if not < 130/80      Encounter for preventive health examination    Annual wellness  exam was done as well as a comprehensive physical exam  .  During the course of the visit the patient was educated and counseled about appropriate screening and preventive services and screenings were brought up to date for cervical and breast cancer .  She will return for fasting labs to provide samples for diabetes screening and lipid analysis with projected  10 year  risk for CAD. nutrition counseling, skin cancer screening has been recommended, along with review of the age appropriate recommended immunizations.  Printed recommendations for health maintenance screenings was given.        Herpes simplex infection    Suspected by history of tongue pain  and exam of tongue lesion.  Confirmed by HSV genotyping.  Recommended trial of acyclovir.       Relevant Medications   acyclovir (ZOVIRAX) 400 MG tablet   Skin neoplasm    Right anterior thigh,  Referral to Dr Kellie Moor for evaluation of suspected squamous cell CA      Relevant Orders   Ambulatory referral to Dermatology    Other Visit Diagnoses    Breast cancer screening    -  Primary    Relevant Orders    MM DIGITAL SCREENING BILATERAL    Screen for STD (sexually transmitted disease)        Relevant Orders    HIV antibody (Completed)    Hepatitis C antibody (Completed)    HSV(herpes simplex vrs)  1+2 ab-IgG (Completed)    Hyperglycemia        Relevant Orders    Comprehensive metabolic panel (Completed)    Hemoglobin A1c (Completed)    Hyperlipidemia        Relevant Orders    LDL cholesterol, direct (Completed)    Lipid panel (Completed)       I am having Katie Huber start  on acyclovir. I am also having her maintain her cholecalciferol, Fish Oil, PARoxetine, triamterene-hydrochlorothiazide, potassium chloride SA, furosemide, losartan, promethazine, and omeprazole. We will continue to administer pneumococcal 13-valent conjugate vaccine.  Meds ordered this encounter  Medications  . acyclovir (ZOVIRAX) 400 MG tablet    Sig: Take 1 tablet (400 mg total) by mouth 5 (five) times daily.    Dispense:  35 tablet    Refill:  1    Medications Discontinued During This Encounter  Medication Reason  . omeprazole (PRILOSEC) 20 MG capsule Duplicate    Follow-up: No Follow-up on file.   [name], MD

## 2015-08-15 LAB — COMPREHENSIVE METABOLIC PANEL
ALBUMIN: 4.1 g/dL (ref 3.5–5.2)
ALT: 15 U/L (ref 0–35)
AST: 21 U/L (ref 0–37)
Alkaline Phosphatase: 63 U/L (ref 39–117)
BILIRUBIN TOTAL: 0.7 mg/dL (ref 0.2–1.2)
BUN: 10 mg/dL (ref 6–23)
CALCIUM: 9.9 mg/dL (ref 8.4–10.5)
CO2: 29 mEq/L (ref 19–32)
CREATININE: 0.59 mg/dL (ref 0.40–1.20)
Chloride: 96 mEq/L (ref 96–112)
GFR: 108.98 mL/min (ref >60.00)
Glucose, Bld: 88 mg/dL (ref 70–99)
Potassium: 4.3 mEq/L (ref 3.5–5.1)
Sodium: 134 mEq/L — ABNORMAL LOW (ref 135–145)
TOTAL PROTEIN: 7.3 g/dL (ref 6.0–8.3)

## 2015-08-15 LAB — LIPID PANEL
CHOL/HDL RATIO: 3
Cholesterol: 229 mg/dL — ABNORMAL HIGH (ref 0–200)
HDL: 78.9 mg/dL (ref >39.00)
LDL Cholesterol: 134 mg/dL — ABNORMAL HIGH (ref 0–99)
NONHDL: 149.61
Triglycerides: 80 mg/dL (ref 0.0–149.0)
VLDL: 16 mg/dL (ref 0.0–40.0)

## 2015-08-15 LAB — HEMOGLOBIN A1C: HEMOGLOBIN A1C: 5.6 % (ref 4.6–6.5)

## 2015-08-15 LAB — HEPATITIS C ANTIBODY: HCV Ab: NEGATIVE

## 2015-08-15 LAB — HIV ANTIBODY (ROUTINE TESTING W REFLEX): HIV: NONREACTIVE

## 2015-08-15 LAB — LDL CHOLESTEROL, DIRECT: LDL DIRECT: 141 mg/dL

## 2015-08-15 LAB — HSV(HERPES SIMPLEX VRS) I + II AB-IGG
HSV 1 GLYCOPROTEIN G AB, IGG: 7.21 IV — AB
HSV 2 GLYCOPROTEIN G AB, IGG: 0.16 IV

## 2015-08-16 ENCOUNTER — Encounter: Payer: Self-pay | Admitting: Internal Medicine

## 2015-08-16 DIAGNOSIS — D492 Neoplasm of unspecified behavior of bone, soft tissue, and skin: Secondary | ICD-10-CM | POA: Insufficient documentation

## 2015-08-16 DIAGNOSIS — B009 Herpesviral infection, unspecified: Secondary | ICD-10-CM | POA: Insufficient documentation

## 2015-08-16 MED ORDER — ACYCLOVIR 400 MG PO TABS
400.0000 mg | ORAL_TABLET | Freq: Every day | ORAL | Status: DC
Start: 2015-08-16 — End: 2015-11-13

## 2015-08-16 NOTE — Assessment & Plan Note (Addendum)
hemoglobin A1c has been consistently at or  less than 6.0 . Patient is up-to-date on eye exams and foot exam is normal today. Patient has mild mircroalbuminuria . [name] Patient has deferred statin therapy for CAD risk reduction and on ACE/ARB for reduction in proteinuria.    Lab Results  Component Value Date   HGBA1C 5.6 08/14/2015   Lab Results  Component Value Date   MICROALBUR 3.4* 01/30/2015

## 2015-08-16 NOTE — Assessment & Plan Note (Signed)
Suspected by history of tongue pain  and exam of tongue lesion.  Confirmed by HSV genotyping.  Recommended trial of acyclovir.

## 2015-08-16 NOTE — Assessment & Plan Note (Signed)
Right anterior thigh,  Referral to Dr Kellie Moor for evaluation of suspected squamous cell CA

## 2015-08-16 NOTE — Assessment & Plan Note (Signed)
Based on current lipid profile, the risk of clinically significant CAD is 17% over the next 10 years, using the Framingham risk calculator. The [employer] recommends starting patients aged 64 or higher on moderate intensity statin therapy for LDL between 70-189 and 10 yr risk of CAD > 7.5% ;  and high intensity therapy for anyone with LDL > 190.   Statin therapy has been offered again to patient.  Lab Results  Component Value Date   CHOL 229* 08/14/2015   HDL 78.90 08/14/2015   LDLCALC 134* 08/14/2015   LDLDIRECT 141.0 08/14/2015   TRIG 80.0 08/14/2015   CHOLHDL 3 08/14/2015

## 2015-08-16 NOTE — Assessment & Plan Note (Signed)
Diagnosed by sleep study. She is wearing her CPAP every night a minimum of 6 hours per night and notes improved daytime wakefulness and decreased fatigue  

## 2015-08-16 NOTE — Assessment & Plan Note (Signed)
Annual wellness  exam was done as well as a comprehensive physical exam  .  During the course of the visit the patient was educated and counseled about appropriate screening and preventive services and screenings were brought up to date for cervical and breast cancer .  She will return for fasting labs to provide samples for diabetes screening and lipid analysis with projected  10 year  risk for CAD. nutrition counseling, skin cancer screening has been recommended, along with review of the age appropriate recommended immunizations.  Printed recommendations for health maintenance screenings was given.   

## 2015-08-16 NOTE — Assessment & Plan Note (Signed)
A/p bariatric surgery.  I have addressed  BMI and recommended wt loss of 10% of body weight over the next 6 months using a low glycemic index diet and regular exercise a minimum of 5 days per week.

## 2015-08-16 NOTE — Assessment & Plan Note (Signed)
Elevated today.  Will have patient check BP at work and increase losartan to 100 mg daily if not < 130/80

## 2015-08-21 ENCOUNTER — Encounter: Payer: Self-pay | Admitting: Internal Medicine

## 2015-09-11 ENCOUNTER — Telehealth: Payer: Self-pay

## 2015-09-11 LAB — COLOGUARD: COLOGUARD: NEGATIVE

## 2015-09-11 MED ORDER — ATORVASTATIN CALCIUM 20 MG PO TABS: 20.0000 mg | ORAL_TABLET | Freq: Every day | ORAL | Status: DC

## 2015-09-11 NOTE — Telephone Encounter (Signed)
Katie Huber failed to act on the last e mail from Katie Huber.  See the Mychart thread.  So I never received the patient's response that she would start the Lipitor. I will send it now.

## 2015-09-11 NOTE — Telephone Encounter (Signed)
Patient called wanted to know if you have called in a low dose lipitor for her.  I don't see it on her med list.  If it is advised [facility] needs [facility] to [facility] a maintenance refill.  Thanks

## 2015-09-11 NOTE — Addendum Note (Signed)
Addended by: [name] on: 09/11/2015 07:12 PM   Modules accepted: Orders

## 2015-09-12 NOTE — Telephone Encounter (Signed)
Spoke with patient left message that prescription has been sent to [facility].

## 2015-09-21 ENCOUNTER — Telehealth: Payer: Self-pay | Admitting: Internal Medicine

## 2015-09-21 NOTE — Telephone Encounter (Signed)
My Chart message sent

## 2015-10-06 ENCOUNTER — Other Ambulatory Visit: Payer: Self-pay | Admitting: Internal Medicine

## 2015-10-16 ENCOUNTER — Other Ambulatory Visit: Payer: Self-pay | Admitting: Internal Medicine

## 2015-11-12 ENCOUNTER — Other Ambulatory Visit: Payer: Self-pay | Admitting: Internal Medicine

## 2015-11-13 ENCOUNTER — Other Ambulatory Visit: Payer: Self-pay | Admitting: Internal Medicine

## 2015-11-13 NOTE — Telephone Encounter (Signed)
Please advise refill? 

## 2015-11-14 NOTE — Telephone Encounter (Signed)
r3efilled 

## 2016-01-03 ENCOUNTER — Other Ambulatory Visit: Payer: Self-pay | Admitting: Internal Medicine

## 2016-01-08 ENCOUNTER — Other Ambulatory Visit: Payer: Self-pay

## 2016-01-08 ENCOUNTER — Other Ambulatory Visit: Payer: Self-pay | Admitting: Internal Medicine

## 2016-01-08 DIAGNOSIS — I10 Essential (primary) hypertension: Secondary | ICD-10-CM

## 2016-01-08 MED ORDER — FUROSEMIDE 20 MG PO TABS: 20.0000 mg | ORAL_TABLET | Freq: Every day | ORAL | Status: DC

## 2016-01-08 MED ORDER — TRIAMTERENE-HCTZ 37.5-25 MG PO TABS: 1.0000 | ORAL_TABLET | Freq: Every day | ORAL | Status: DC

## 2016-01-08 MED ORDER — ATORVASTATIN CALCIUM 20 MG PO TABS: 20.0000 mg | ORAL_TABLET | Freq: Every day | ORAL | Status: DC

## 2016-01-08 MED ORDER — LOSARTAN POTASSIUM 50 MG PO TABS: 50.0000 mg | ORAL_TABLET | Freq: Every day | ORAL | Status: DC

## 2016-01-08 MED ORDER — PROMETHAZINE HCL 25 MG PO TABS: 25.0000 mg | ORAL_TABLET | Freq: Three times a day (TID) | ORAL | Status: DC | PRN

## 2016-01-08 MED ORDER — POTASSIUM CHLORIDE CRYS ER 20 MEQ PO TBCR: 20.0000 meq | EXTENDED_RELEASE_TABLET | Freq: Every day | ORAL | Status: DC

## 2016-01-08 NOTE — Telephone Encounter (Signed)
Ok to refill,  Refill sent  

## 2016-01-08 NOTE — Telephone Encounter (Signed)
Medication was refilled in March. Please advise?

## 2016-01-11 ENCOUNTER — Other Ambulatory Visit: Payer: Self-pay

## 2016-01-11 MED ORDER — PAROXETINE HCL 20 MG PO TABS: 20.0000 mg | ORAL_TABLET | Freq: Every morning | ORAL | Status: DC

## 2016-01-14 ENCOUNTER — Other Ambulatory Visit: Payer: Self-pay | Admitting: Internal Medicine

## 2016-01-15 ENCOUNTER — Telehealth: Payer: Self-pay | Admitting: Internal Medicine

## 2016-01-15 NOTE — Telephone Encounter (Signed)
Tried calling patient to see what pharmacy she wanted it sent to due to it being sent to [facility]

## 2016-01-15 NOTE — Telephone Encounter (Signed)
Called patient to see which pharmacy she'd like to use [facility]?

## 2016-05-29 ENCOUNTER — Telehealth: Payer: Self-pay | Admitting: *Deleted

## 2016-05-29 DIAGNOSIS — E559 Vitamin D deficiency, unspecified: Secondary | ICD-10-CM

## 2016-05-29 DIAGNOSIS — I10 Essential (primary) hypertension: Secondary | ICD-10-CM

## 2016-05-29 DIAGNOSIS — R7301 Impaired fasting glucose: Secondary | ICD-10-CM

## 2016-05-29 DIAGNOSIS — E785 Hyperlipidemia, unspecified: Secondary | ICD-10-CM

## 2016-05-29 NOTE — Telephone Encounter (Signed)
Patient has an appointment scheduled for 07/02/16, she questioned if she will need labs before schedule before appt.  She also needs a order placed for her mammogram .

## 2016-05-29 NOTE — Telephone Encounter (Signed)
Last labs were in 08/2015, please advise if you want prior to appt.  thanks

## 2016-05-30 NOTE — Telephone Encounter (Signed)
Fasting labs ordered

## 2016-05-30 NOTE — Telephone Encounter (Signed)
Attempted to call patient and place on lab schedule.  No answer.  Left a message to call the office back.

## 2016-06-11 ENCOUNTER — Ambulatory Visit
Admission: RE | Admit: 2016-06-11 | Discharge: 2016-06-11 | Disposition: A | Discharge status: Home / Self Care | Payer: Medicare Other | Source: Ambulatory Visit | Attending: Internal Medicine | Admitting: Internal Medicine

## 2016-06-11 DIAGNOSIS — Z1239 Encounter for other screening for malignant neoplasm of breast: Secondary | ICD-10-CM

## 2016-06-11 DIAGNOSIS — Z1231 Encounter for screening mammogram for malignant neoplasm of breast: Secondary | ICD-10-CM | POA: Insufficient documentation

## 2016-06-23 ENCOUNTER — Other Ambulatory Visit (INDEPENDENT_AMBULATORY_CARE_PROVIDER_SITE_OTHER): Payer: Medicare Other

## 2016-06-23 DIAGNOSIS — I10 Essential (primary) hypertension: Secondary | ICD-10-CM

## 2016-06-23 DIAGNOSIS — R7301 Impaired fasting glucose: Secondary | ICD-10-CM

## 2016-06-23 DIAGNOSIS — E785 Hyperlipidemia, unspecified: Secondary | ICD-10-CM | POA: Diagnosis not present

## 2016-06-23 DIAGNOSIS — E559 Vitamin D deficiency, unspecified: Secondary | ICD-10-CM

## 2016-06-23 LAB — LIPID PANEL
Cholesterol: 159 mg/dL (ref 0–200)
HDL: 53.5 mg/dL (ref >39.00)
LDL CALC: 72 mg/dL (ref 0–99)
NONHDL: 105.29
Total CHOL/HDL Ratio: 3
Triglycerides: 168 mg/dL — ABNORMAL HIGH (ref 0.0–149.0)
VLDL: 33.6 mg/dL (ref 0.0–40.0)

## 2016-06-23 LAB — COMPREHENSIVE METABOLIC PANEL
ALK PHOS: 52 U/L (ref 39–117)
ALT: 17 U/L (ref 0–35)
AST: 16 U/L (ref 0–37)
Albumin: 4.1 g/dL (ref 3.5–5.2)
BILIRUBIN TOTAL: 0.6 mg/dL (ref 0.2–1.2)
BUN: 13 mg/dL (ref 6–23)
CALCIUM: 9.9 mg/dL (ref 8.4–10.5)
CO2: 34 meq/L — AB (ref 19–32)
CREATININE: 0.6 mg/dL (ref 0.40–1.20)
Chloride: 101 mEq/L (ref 96–112)
GFR: 106.6 mL/min (ref >60.00)
GLUCOSE: 141 mg/dL — AB (ref 70–99)
Potassium: 4.1 mEq/L (ref 3.5–5.1)
Sodium: 139 mEq/L (ref 135–145)
TOTAL PROTEIN: 6.8 g/dL (ref 6.0–8.3)

## 2016-06-23 LAB — HEMOGLOBIN A1C: Hgb A1c MFr Bld: 6.4 % (ref 4.6–6.5)

## 2016-06-23 LAB — VITAMIN D 25 HYDROXY (VIT D DEFICIENCY, FRACTURES): VITD: 26.56 ng/mL — AB (ref 30.00–100.00)

## 2016-06-23 LAB — LDL CHOLESTEROL, DIRECT: Direct LDL: 82 mg/dL

## 2016-06-25 ENCOUNTER — Encounter: Payer: Self-pay | Admitting: Internal Medicine

## 2016-07-02 ENCOUNTER — Ambulatory Visit (INDEPENDENT_AMBULATORY_CARE_PROVIDER_SITE_OTHER): Payer: Medicare Other | Admitting: Internal Medicine

## 2016-07-02 ENCOUNTER — Encounter: Payer: Self-pay | Admitting: Internal Medicine

## 2016-07-02 VITALS — BP 142/78 | HR 60 | Temp 98.2°F | Ht 66.25 in | Wt 337.4 lb

## 2016-07-02 DIAGNOSIS — G4730 Sleep apnea, unspecified: Secondary | ICD-10-CM | POA: Diagnosis not present

## 2016-07-02 DIAGNOSIS — I272 Other secondary pulmonary hypertension: Secondary | ICD-10-CM

## 2016-07-02 DIAGNOSIS — R7303 Prediabetes: Secondary | ICD-10-CM

## 2016-07-02 DIAGNOSIS — I10 Essential (primary) hypertension: Secondary | ICD-10-CM

## 2016-07-02 MED ORDER — PAROXETINE HCL 20 MG PO TABS: 20.0000 mg | ORAL_TABLET | Freq: Every morning | ORAL | 2 refills | Status: DC

## 2016-07-02 NOTE — Patient Instructions (Addendum)
All refills will be sent to  [facility]  I want your weight  to be < 300 next time I see you (6 months )   Sleep study to be scheduled at follow up     To make a low carb chip :  Take the Joseph[employer] Lavash or Pita bread,  Or the [place] Low carb whole wheat tortilla   Place on metal cookie sheet  Brush with olive oil  Sprinkle garlic powder (NOT garlic salt), grated parmesan cheese, mediterranean seasoning , or all of them?  Bake at 275 for 30 minutes   We have substitutions for your potatoes!!  Try the mashed cauliflower and riced cauliflower dishes instead of rice and mashed potatoes  Mashed turnips are also very low carb!   For desserts :  Try the [employer] greek yogurt dessert flavors and top with reddi Whip .  8 carbs,  80 calories  Try [employer]ikos Triple Zero [place] Yogurt in the salted caramel, and the coffee flavors  With Whipped Cream for dessert  breyer's low carb ice cream, available in bars (on a stick, better ) or scoopable ice cream  HERE ARE THE LOW CARB  BREAD CHOICES

## 2016-07-02 NOTE — Progress Notes (Signed)
Subjective:  Patient ID: Katie Huber, female    DOB: [birth year]  Age: 65 y.o. MRN: [id number]  CC: The primary encounter diagnosis was Sleep apnea. Diagnoses of Essential hypertension, benign, Obesity, Class III, BMI 40-49.9 (morbid obesity) ([place]), Pulmonary hypertension ([place]), and Prediabetes were also pertinent to this visit.  HPI Katie Huber presents for FOLLOW UP ON  Hypertension, hyperlipidemia, IPG, and morbid obesity.  patient was  last seen I nSept 2016.  She has had a 35 lb weight gain since then, and her blood pressure is mildly elevated.  Weight gain discussed.  She admits that she has been binging on food since thanksgiving.  She denies depression, anxiety and other untreated mood disorders as a cause.  She has been taking paxil for years and feels that her moods are well controlled but has concerns about long term use of SSRIs because of a news article that she read.  Discussed the risks vs benefits.   Discussed use of ibuprofen for oral surgery pain and neck pain   She is verdue for an  eye exam, last one was  2 years ago  Has not had a sleep study in 20 years . Symptoms suggestive of OSA are snoring, hypertension, frequent nocturnal wakenings, morning headaches and daytime somnolence.   Wants to have it done by [facility] .    Lab Results  Component Value Date   HGBA1C 6.4 06/23/2016   Lab Results  Component Value Date   CHOL 159 06/23/2016   HDL 53.50 06/23/2016   LDLCALC 72 06/23/2016   LDLDIRECT 82.0 06/23/2016   TRIG 168.0 (H) 06/23/2016   CHOLHDL 3 06/23/2016    Lab Results  Component Value Date   CREATININE 0.60 06/23/2016   .l Outpatient Medications Prior to Visit  Medication Sig Dispense Refill  . cholecalciferol (VITAMIN D) 1000 UNITS tablet Take 1,000 Units by mouth daily.    . Omega-3 Fatty Acids (FISH OIL) 1000 MG CAPS Take 1 capsule by mouth daily.    [name] omeprazole (PRILOSEC) 20 MG capsule TAKE 1 CAPSULE TWICE A DAY 180 capsule 1  .  acyclovir (ZOVIRAX) 400 MG tablet TAKE ONE TABLET BY MOUTH FIVE TIMES DAILY 35 tablet 1  . atorvastatin (LIPITOR) 20 MG tablet Take 1 tablet (20 mg total) by mouth daily. 90 tablet 3  . furosemide (LASIX) 20 MG tablet Take 1 tablet (20 mg total) by mouth daily. AS NEEDED FOR FLUID RETENTION 30 tablet 3  . losartan (COZAAR) 50 MG tablet TAKE 1 TABLET DAILY 90 tablet 3  . PARoxetine (PAXIL) 20 MG tablet Take 1 tablet (20 mg total) by mouth every morning. 90 tablet 2  . potassium chloride SA (K-DUR,KLOR-CON) 20 MEQ tablet Take 1 tablet (20 mEq total) by mouth daily. WHEN USING FUROSEMIDE 30 tablet 3  . promethazine (PHENERGAN) 25 MG tablet Take 1 tablet (25 mg total) by mouth every 8 (eight) hours as needed for nausea or vomiting. 30 tablet 2  . triamterene-hydrochlorothiazide (MAXZIDE-25) 37.5-25 MG tablet Take 1 tablet by mouth daily. 90 tablet 1   Facility-Administered Medications Prior to Visit  Medication Dose Route Frequency Provider Last Rate Last Dose  . pneumococcal 13-valent conjugate vaccine (PREVNAR 13) injection 0.5 mL  0.5 mL Intramuscular Once [name], MD        Review of Systems;  Patient denies headache, fevers, malaise, unintentional weight loss, skin rash, eye pain, sinus congestion and sinus pain, sore throat, dysphagia,  hemoptysis , cough, dyspnea, wheezing, chest pain,  palpitations, orthopnea, edema, abdominal pain, nausea, melena, diarrhea, constipation, flank pain, dysuria, hematuria, urinary  Frequency, nocturia, numbness, tingling, seizures,  Focal weakness, Loss of consciousness,  Tremor, insomnia, depression, anxiety, and suicidal ideation.      Objective:  BP (!) 142/78   Pulse 60   Temp 98.2 F (36.8 C) (Oral)   Ht 5' 6.25" (1.683 m)   Wt (!) 337 lb 6 oz (153 kg)   SpO2 95%   BMI 54.04 kg/m   BP Readings from Last 3 Encounters:  07/02/16 (!) 142/78  08/14/15 (!) 160/80  02/06/15 (!) 148/78    Wt Readings from Last 3 Encounters:  07/02/16 (!)  337 lb 6 oz (153 kg)  08/14/15 (!) 302 lb 12 oz (137.3 kg)  02/06/15 298 lb 8 oz (135.4 kg)    General appearance: alert, cooperative and appears stated age Ears: normal TM's and external ear canals both ears Throat: lips, mucosa, and tongue normal; teeth and gums normal Neck: no adenopathy, no carotid bruit, supple, symmetrical, trachea midline and thyroid not enlarged, symmetric, no tenderness/mass/nodules Back: symmetric, no curvature. ROM normal. No CVA tenderness. Lungs: clear to auscultation bilaterally Heart: regular rate and rhythm, S1, S2 normal, no murmur, click, rub or gallop Abdomen: soft, non-tender; bowel sounds normal; no masses,  no organomegaly Pulses: 2+ and symmetric Skin: Skin color, texture, turgor normal. No rashes or lesions Lymph nodes: Cervical, supraclavicular, and axillary nodes normal.  Lab Results  Component Value Date   HGBA1C 6.4 06/23/2016   HGBA1C 5.6 08/14/2015   HGBA1C 5.7 01/30/2015    Lab Results  Component Value Date   CREATININE 0.60 06/23/2016   CREATININE 0.59 08/14/2015   CREATININE 0.59 01/30/2015    Lab Results  Component Value Date   WBC 4.9 06/08/2014   HGB 14.5 06/08/2014   HCT 42.7 06/08/2014   PLT 230.0 06/08/2014   GLUCOSE 141 (H) 06/23/2016   CHOL 159 06/23/2016   TRIG 168.0 (H) 06/23/2016   HDL 53.50 06/23/2016   LDLDIRECT 82.0 06/23/2016   LDLCALC 72 06/23/2016   ALT 17 06/23/2016   AST 16 06/23/2016   NA 139 06/23/2016   K 4.1 06/23/2016   CL 101 06/23/2016   CREATININE 0.60 06/23/2016   BUN 13 06/23/2016   CO2 34 (H) 06/23/2016   TSH 1.08 06/08/2014   HGBA1C 6.4 06/23/2016   MICROALBUR 3.4 (H) 01/30/2015    Mm Digital Screening Bilateral  Result Date: 06/12/2016 CLINICAL DATA:  Screening. EXAM: DIGITAL SCREENING BILATERAL MAMMOGRAM WITH CAD COMPARISON:  Previous exam(s). ACR Breast Density Category b: There are scattered areas of fibroglandular density. FINDINGS: There are no findings suspicious for  malignancy. Images were processed with CAD. IMPRESSION: No mammographic evidence of malignancy. A result letter of this screening mammogram will be mailed directly to the patient. RECOMMENDATION: Screening mammogram in one year. (Code:SM-B-01Y) BI-RADS CATEGORY  1: Negative. Electronically Signed   By: Pamelia Hoit M.D.   On: 06/12/2016 09:19    Assessment & Plan:   Problem List Items Addressed This Visit    Obesity, Class III, BMI 40-49.9 (morbid obesity) ([place])    DESPITE HISTORY O  BARIATRIC SURGERY. I have addressed  BMI and recommended wt loss of 10% of body weighT over the next 6 months using a low glycemic index diet and regular exercise a minimum of 5 days per week.        Sleep apnea - Primary    Diagnosed by sleep study over 20 years ago with  no recent study and  Recent  lapse in therapy . Repeat sleep study ordered , per patient request to be done by Dr Jobe Igo at follow up visit 3 months      Relevant Orders   Ambulatory referral to Sleep Studies   Prediabetes    hemoglobin A1c increased from historically less than 6.0 to 6.4  Patient is behind in on eye exams and urged to make lifestyle changes including following a low glycemic index diet and a daily walking program.  She is tolerating statin therapy and  ACE/ARB for reduction in proteinuria.    Lab Results  Component Value Date   HGBA1C 6.4 06/23/2016   Lab Results  Component Value Date   MICROALBUR 3.4 (H) 01/30/2015           Essential hypertension, benign    Elevated today.  Reviewed list of meds, patient is taking ibuprofen, and has gained 35 lbs.  Will increase losartan to 100 mg daily       Relevant Medications   losartan (COZAAR) 100 MG tablet   triamterene-hydrochlorothiazide (MAXZIDE-25) 37.5-25 MG tablet   furosemide (LASIX) 20 MG tablet   atorvastatin (LIPITOR) 20 MG tablet   Pulmonary hypertension (H[facility])    Secondary to use of FEN FEN years ago,  But likely aggravated by morbid obesity and will  likely limit her ability to exercise.        Relevant Medications   losartan (COZAAR) 100 MG tablet   triamterene-hydrochlorothiazide (MAXZIDE-25) 37.5-25 MG tablet   furosemide (LASIX) 20 MG tablet   atorvastatin (LIPITOR) 20 MG tablet    Other Visit Diagnoses   None.     I have changed Ms. Bouldin's losartan. I am also having her maintain her cholecalciferol, Fish Oil, omeprazole, PARoxetine, triamterene-hydrochlorothiazide, furosemide, atorvastatin, potassium chloride SA, promethazine, and acyclovir. We will continue to administer pneumococcal 13-valent conjugate vaccine.  Meds ordered this encounter  Medications  . PARoxetine (PAXIL) 20 MG tablet    Sig: Take 1 tablet (20 mg total) by mouth every morning.    Dispense:  90 tablet    Refill:  2  . losartan (COZAAR) 100 MG tablet    Sig: Take 1 tablet (100 mg total) by mouth daily.    Dispense:  30 tablet    Refill:  0  . triamterene-hydrochlorothiazide (MAXZIDE-25) 37.5-25 MG tablet    Sig: Take 1 tablet by mouth daily.    Dispense:  90 tablet    Refill:  1  . furosemide (LASIX) 20 MG tablet    Sig: Take 1 tablet (20 mg total) by mouth daily. AS NEEDED FOR FLUID RETENTION    Dispense:  30 tablet    Refill:  0  . atorvastatin (LIPITOR) 20 MG tablet    Sig: Take 1 tablet (20 mg total) by mouth daily.    Dispense:  90 tablet    Refill:  3  . potassium chloride SA (K-DUR,KLOR-CON) 20 MEQ tablet    Sig: Take 1 tablet (20 mEq total) by mouth daily. WHEN USING FUROSEMIDE    Dispense:  30 tablet    Refill:  3  . promethazine (PHENERGAN) 25 MG tablet    Sig: Take 1 tablet (25 mg total) by mouth every 8 (eight) hours as needed for nausea or vomiting.    Dispense:  30 tablet    Refill:  2    DO NOT FILL,  KEEP ON FILE FOR FUTURE REFILL REQUESTS  . acyclovir (ZOVIRAX) 400 MG tablet  Sig: TAKE ONE TABLET BY MOUTH FIVE TIMES DAILY    Dispense:  35 tablet    Refill:  1    Do not fill; keep on file for future refill requests    A total of 25 minutes of face to face time was spent with patient more than half of which was spent in counselling about the above mentioned conditions  and coordination of care   Medications Discontinued During This Encounter  Medication Reason  . PARoxetine (PAXIL) 20 MG tablet Reorder  . losartan (COZAAR) 50 MG tablet Reorder  . triamterene-hydrochlorothiazide (MAXZIDE-25) 37.5-25 MG tablet Reorder  . furosemide (LASIX) 20 MG tablet Reorder  . atorvastatin (LIPITOR) 20 MG tablet Reorder  . potassium chloride SA (K-DUR,KLOR-CON) 20 MEQ tablet Reorder  . promethazine (PHENERGAN) 25 MG tablet Reorder  . acyclovir (ZOVIRAX) 400 MG tablet Reorder    Follow-up: Return in about 6 months (around 01/02/2017), or medicare wellness .   [name], MD

## 2016-07-05 MED ORDER — FUROSEMIDE 20 MG PO TABS: 20.0000 mg | ORAL_TABLET | Freq: Every day | ORAL | 0 refills | Status: DC

## 2016-07-05 MED ORDER — PROMETHAZINE HCL 25 MG PO TABS: 25.0000 mg | ORAL_TABLET | Freq: Three times a day (TID) | ORAL | 2 refills | Status: DC | PRN

## 2016-07-05 MED ORDER — ACYCLOVIR 400 MG PO TABS: ORAL_TABLET | ORAL | 1 refills | Status: DC

## 2016-07-05 MED ORDER — TRIAMTERENE-HCTZ 37.5-25 MG PO TABS: 1.0000 | ORAL_TABLET | Freq: Every day | ORAL | 1 refills | Status: DC

## 2016-07-05 MED ORDER — POTASSIUM CHLORIDE CRYS ER 20 MEQ PO TBCR: 20.0000 meq | EXTENDED_RELEASE_TABLET | Freq: Every day | ORAL | 3 refills | Status: DC

## 2016-07-05 MED ORDER — ATORVASTATIN CALCIUM 20 MG PO TABS: 20.0000 mg | ORAL_TABLET | Freq: Every day | ORAL | 3 refills | Status: DC

## 2016-07-05 MED ORDER — LOSARTAN POTASSIUM 100 MG PO TABS: 100.0000 mg | ORAL_TABLET | Freq: Every day | ORAL | 0 refills | Status: DC

## 2016-07-05 NOTE — Assessment & Plan Note (Signed)
hemoglobin A1c increased from historically less than 6.0 to 6.4  Patient is behind in on eye exams and urged to make lifestyle changes including following a low glycemic index diet and a daily walking program.  She is tolerating statin therapy and  ACE/ARB for reduction in proteinuria.    Lab Results  Component Value Date   HGBA1C 6.4 06/23/2016   Lab Results  Component Value Date   MICROALBUR 3.4 (H) 01/30/2015

## 2016-07-05 NOTE — Assessment & Plan Note (Signed)
Elevated today.  Reviewed list of meds, patient is taking ibuprofen, and has gained 35 lbs.  Will increase losartan to 100 mg daily

## 2016-07-05 NOTE — Assessment & Plan Note (Signed)
Diagnosed by sleep study over 20 years ago with no recent study and  Recent  lapse in therapy . Repeat sleep study ordered , per patient request to be done by Dr Jobe Igo at follow up visit 3 months

## 2016-07-05 NOTE — Assessment & Plan Note (Addendum)
DESPITE HISTORY O  BARIATRIC SURGERY. I have addressed  BMI and recommended wt loss of 10% of body weighT over the next 6 months using a low glycemic index diet and regular exercise a minimum of 5 days per week.

## 2016-07-05 NOTE — Assessment & Plan Note (Signed)
Secondary to use of FEN FEN years ago,  But likely aggravated by morbid obesity and will likely limit her ability to exercise.

## 2016-07-07 ENCOUNTER — Other Ambulatory Visit: Payer: Self-pay

## 2016-07-07 MED ORDER — PAROXETINE HCL 20 MG PO TABS: 20.0000 mg | ORAL_TABLET | Freq: Every morning | ORAL | 2 refills | Status: DC

## 2016-07-07 MED ORDER — PROMETHAZINE HCL 25 MG PO TABS: 25.0000 mg | ORAL_TABLET | Freq: Three times a day (TID) | ORAL | 0 refills | Status: DC | PRN

## 2016-07-07 MED ORDER — LOSARTAN POTASSIUM 100 MG PO TABS: 100.0000 mg | ORAL_TABLET | Freq: Every day | ORAL | 1 refills | Status: DC

## 2016-07-07 MED ORDER — ATORVASTATIN CALCIUM 20 MG PO TABS: 20.0000 mg | ORAL_TABLET | Freq: Every day | ORAL | 3 refills | Status: DC

## 2016-07-07 MED ORDER — FUROSEMIDE 20 MG PO TABS: 20.0000 mg | ORAL_TABLET | Freq: Every day | ORAL | 0 refills | Status: DC

## 2016-07-07 MED ORDER — OMEPRAZOLE 20 MG PO CPDR: 20.0000 mg | DELAYED_RELEASE_CAPSULE | Freq: Two times a day (BID) | ORAL | 1 refills | Status: DC

## 2016-07-07 MED ORDER — ACYCLOVIR 400 MG PO TABS: ORAL_TABLET | ORAL | 0 refills | Status: DC

## 2016-07-07 MED ORDER — TRIAMTERENE-HCTZ 37.5-25 MG PO TABS: 1.0000 | ORAL_TABLET | Freq: Every day | ORAL | 1 refills | Status: DC

## 2016-07-07 MED ORDER — POTASSIUM CHLORIDE CRYS ER 20 MEQ PO TBCR: 20.0000 meq | EXTENDED_RELEASE_TABLET | Freq: Every day | ORAL | 0 refills | Status: DC

## 2016-07-07 NOTE — Telephone Encounter (Signed)
Received refill request for multiple medications for the patient from [facility], verified with patient that this is the new pharmacy she is using for chronic medications. She states yes due to her insurance, she did not fill medications that were sent to [facility] during her last visit. Refills resent for the patient to [id number]

## 2016-07-17 ENCOUNTER — Telehealth: Payer: Self-pay | Admitting: Internal Medicine

## 2016-07-17 MED ORDER — PROMETHAZINE HCL 25 MG PO TABS: 25.0000 mg | ORAL_TABLET | Freq: Three times a day (TID) | ORAL | 0 refills | Status: DC | PRN

## 2016-07-17 NOTE — Telephone Encounter (Signed)
Medication has been sent to [facility]

## 2016-07-17 NOTE — Telephone Encounter (Signed)
Please fax  promethazine (PHENERGAN) 25 MG tablet  medication to [facility] instead of her mail order.

## 2016-08-25 ENCOUNTER — Other Ambulatory Visit: Payer: Self-pay | Admitting: Internal Medicine

## 2016-10-21 ENCOUNTER — Ambulatory Visit: Payer: Medicare Other | Attending: Internal Medicine

## 2016-10-21 DIAGNOSIS — G4730 Sleep apnea, unspecified: Secondary | ICD-10-CM | POA: Diagnosis present

## 2016-10-21 DIAGNOSIS — G4761 Periodic limb movement disorder: Secondary | ICD-10-CM | POA: Insufficient documentation

## 2016-10-21 DIAGNOSIS — G4733 Obstructive sleep apnea (adult) (pediatric): Secondary | ICD-10-CM | POA: Insufficient documentation

## 2016-10-21 DIAGNOSIS — G4710 Hypersomnia, unspecified: Secondary | ICD-10-CM | POA: Diagnosis not present

## 2016-11-16 ENCOUNTER — Other Ambulatory Visit: Payer: Self-pay | Admitting: Internal Medicine

## 2017-01-05 ENCOUNTER — Ambulatory Visit: Payer: Medicare Other | Admitting: Internal Medicine

## 2017-01-05 ENCOUNTER — Ambulatory Visit
Admission: EM | Admit: 2017-01-05 | Discharge: 2017-01-05 | Disposition: A | Discharge status: Home / Self Care | Payer: Medicare Other | Attending: Family Medicine | Admitting: Family Medicine

## 2017-01-05 DIAGNOSIS — M545 Low back pain, unspecified: Secondary | ICD-10-CM

## 2017-01-05 DIAGNOSIS — M79605 Pain in left leg: Principal | ICD-10-CM

## 2017-01-05 MED ORDER — ONDANSETRON 4 MG PO TBDP
4.0000 mg | ORAL_TABLET | Freq: Once | ORAL | Status: AC
  Administered 2017-01-05: 4 mg via ORAL

## 2017-01-05 MED ORDER — ONDANSETRON 4 MG PO TBDP: 4.0000 mg | ORAL_TABLET | Freq: Three times a day (TID) | ORAL | 0 refills | Status: DC | PRN

## 2017-01-05 MED ORDER — OXYCODONE-ACETAMINOPHEN 5-325 MG PO TABS: 1.0000 | ORAL_TABLET | Freq: Four times a day (QID) | ORAL | 0 refills | Status: DC | PRN

## 2017-01-05 MED ORDER — KETOROLAC TROMETHAMINE 60 MG/2ML IM SOLN
60.0000 mg | Freq: Once | INTRAMUSCULAR | Status: AC
  Administered 2017-01-05: 60 mg via INTRAMUSCULAR

## 2017-01-05 NOTE — Discharge Instructions (Signed)
Take medication as prescribed. Rest. Drink plenty of fluids. Apply ice. Stretch.   Follow up with your primary care physician this week. Return to Urgent care or Emergency room for increased pain, difficulty walking, loss of sensation, urinary or bowel retention or incontinence, new or worsening concerns.

## 2017-01-05 NOTE — ED Triage Notes (Signed)
Pt fell this afternoon carrying a couple packages, she burning at her sciatic nerve. She has had this happen before. It runs down the left buttock.
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
## 2017-01-07 DIAGNOSIS — I10 Essential (primary) hypertension: Secondary | ICD-10-CM | POA: Diagnosis not present

## 2017-01-07 LAB — HM DIABETES EYE EXAM

## 2017-01-09 ENCOUNTER — Other Ambulatory Visit: Payer: Self-pay | Admitting: Otolaryngology

## 2017-01-09 DIAGNOSIS — R51 Headache: Secondary | ICD-10-CM | POA: Diagnosis not present

## 2017-01-09 DIAGNOSIS — B079 Viral wart, unspecified: Secondary | ICD-10-CM | POA: Diagnosis not present

## 2017-01-09 DIAGNOSIS — G500 Trigeminal neuralgia: Secondary | ICD-10-CM

## 2017-01-09 DIAGNOSIS — J3489 Other specified disorders of nose and nasal sinuses: Secondary | ICD-10-CM | POA: Diagnosis not present

## 2017-01-12 ENCOUNTER — Ambulatory Visit: Payer: Medicare Other | Admitting: Internal Medicine

## 2017-01-21 ENCOUNTER — Ambulatory Visit
Admission: RE | Admit: 2017-01-21 | Discharge: 2017-01-21 | Disposition: A | Discharge status: Home / Self Care | Payer: Medicare Other | Source: Ambulatory Visit | Attending: Otolaryngology | Admitting: Otolaryngology

## 2017-01-21 ENCOUNTER — Other Ambulatory Visit
Admission: RE | Admit: 2017-01-21 | Discharge: 2017-01-21 | Disposition: A | Discharge status: Home / Self Care | Payer: Medicare Other | Source: Ambulatory Visit | Attending: Otolaryngology | Admitting: Otolaryngology

## 2017-01-21 DIAGNOSIS — R51 Headache: Secondary | ICD-10-CM | POA: Diagnosis not present

## 2017-01-21 DIAGNOSIS — G500 Trigeminal neuralgia: Secondary | ICD-10-CM | POA: Insufficient documentation

## 2017-01-21 DIAGNOSIS — G319 Degenerative disease of nervous system, unspecified: Secondary | ICD-10-CM | POA: Diagnosis not present

## 2017-01-21 DIAGNOSIS — I672 Cerebral atherosclerosis: Secondary | ICD-10-CM | POA: Insufficient documentation

## 2017-01-21 LAB — CREATININE, SERUM
Creatinine, Ser: 0.69 mg/dL (ref 0.44–1.00)
GFR calc Af Amer: >60 mL/min (ref >60)

## 2017-01-21 MED ORDER — GADOBENATE DIMEGLUMINE 529 MG/ML IV SOLN
20.0000 mL | Freq: Once | INTRAVENOUS | Status: AC | PRN
Start: 2017-01-21 — End: 2017-01-21
  Administered 2017-01-21: 20 mL via INTRAVENOUS

## 2017-01-27 DIAGNOSIS — J40 Bronchitis, not specified as acute or chronic: Secondary | ICD-10-CM | POA: Diagnosis not present

## 2017-02-03 ENCOUNTER — Encounter: Payer: Self-pay | Admitting: Family Medicine

## 2017-02-03 ENCOUNTER — Ambulatory Visit (INDEPENDENT_AMBULATORY_CARE_PROVIDER_SITE_OTHER): Payer: Medicare Other | Admitting: Family Medicine

## 2017-02-03 DIAGNOSIS — J209 Acute bronchitis, unspecified: Secondary | ICD-10-CM | POA: Diagnosis not present

## 2017-02-03 MED ORDER — AZITHROMYCIN 250 MG PO TABS: ORAL_TABLET | ORAL | 0 refills | Status: DC

## 2017-02-03 MED ORDER — HYDROCOD POLST-CPM POLST ER 10-8 MG/5ML PO SUER: 5.0000 mL | Freq: Two times a day (BID) | ORAL | 0 refills | Status: DC | PRN

## 2017-02-03 MED ORDER — PREDNISONE 50 MG PO TABS: ORAL_TABLET | ORAL | 0 refills | Status: DC

## 2017-02-03 NOTE — Assessment & Plan Note (Signed)
New acute problem. Treating with prednisone, Tussionex, azithromycin.

## 2017-02-03 NOTE — Patient Instructions (Signed)

## 2017-02-03 NOTE — Progress Notes (Signed)
Pre visit review using our clinic review tool, if applicable. No additional management support is needed unless otherwise documented below in the visit note. 

## 2017-02-03 NOTE — Progress Notes (Signed)
Subjective:  Patient ID: Katie Huber, female    DOB: 06/18/1951  Age: 66 y.o. MRN: [id number]  CC: Cough, wheezing  HPI:  66 year old female presents for an acute visit with complaints of cough and wheezing.  Patient reports a 3 week history of cough and wheezing. Cough is slightly productive. No associated fever. She does have some dyspnea secondary to wheezing. She's been using over-the-counter Robitussin and Mucinex without significant improvement. No known exacerbating factors. No other associated symptoms. No other complaints or concerns at this time.  Social Hx   Social History   Social History  . Marital status: Single    Spouse name: N/A  . Number of children: N/A  . Years of education: N/A   Social History Main Topics  . Smoking status: Never Smoker  . Smokeless tobacco: Never Used  . Alcohol use None  . Drug use: No  . Sexual activity: Not Currently   Other Topics Concern  . None   Social History Narrative  . None    Review of Systems  Constitutional: Negative for fever.  Respiratory: Positive for cough, shortness of breath and wheezing.    Objective:  BP 132/80   Pulse 72   Temp 98.4 F (36.9 C) (Oral)   Wt (!) 341 lb 8 oz (154.9 kg)   SpO2 94%   BMI 54.70 kg/m   BP/Weight 02/03/2017 [id number]  Systolic BP Q000111Q [id number]  Diastolic BP 80 77 78  Wt. (Lbs) 341.5 - 337.38  BMI 54.7 - 54.04   Physical Exam  Constitutional: She is oriented to person, place, and time. She appears well-developed. No distress.  Morbidly obese.  HENT:  Mouth/Throat: Oropharynx is clear and moist.  Eyes: Conjunctivae are normal.  Neck: Neck supple.  Cardiovascular: Normal rate and regular rhythm.   Pulmonary/Chest: Effort normal.  Diffuse wheezing.  Lymphadenopathy:    She has no cervical adenopathy.  Neurological: She is alert and oriented to person, place, and time.  Psychiatric: She has a normal mood and affect.  Vitals reviewed.   Lab Results    Component Value Date   WBC 4.9 06/08/2014   HGB 14.5 06/08/2014   HCT 42.7 06/08/2014   PLT 230.0 06/08/2014   GLUCOSE 141 (H) 06/23/2016   CHOL 159 06/23/2016   TRIG 168.0 (H) 06/23/2016   HDL 53.50 06/23/2016   LDLDIRECT 82.0 06/23/2016   LDLCALC 72 06/23/2016   ALT 17 06/23/2016   AST 16 06/23/2016   NA 139 06/23/2016   K 4.1 06/23/2016   CL 101 06/23/2016   CREATININE 0.69 01/21/2017   BUN 13 06/23/2016   CO2 34 (H) 06/23/2016   TSH 1.08 06/08/2014   HGBA1C 6.4 06/23/2016   MICROALBUR 3.4 (H) 01/30/2015    Assessment & Plan:   Problem List Items Addressed This Visit    Acute bronchitis    New acute problem. Treating with prednisone, Tussionex, azithromycin.         Meds ordered this encounter  Medications  . predniSONE (DELTASONE) 50 MG tablet    Sig: 1 tablet daily x 5 days.    Dispense:  5 tablet    Refill:  0  . azithromycin (ZITHROMAX) 250 MG tablet    Sig: 2 tablets on day 1 then 1 tablet daily on days 2-5.    Dispense:  6 tablet    Refill:  0  . chlorpheniramine-HYDROcodone (TUSSIONEX PENNKINETIC ER) 10-8 MG/5ML SUER    Sig: Take 5 mLs by mouth every  12 (twelve) hours as needed.    Dispense:  115 mL    Refill:  0    Follow-up: PRN  [place]

## 2017-02-07 ENCOUNTER — Other Ambulatory Visit: Payer: Self-pay | Admitting: Internal Medicine

## 2017-02-22 ENCOUNTER — Other Ambulatory Visit: Payer: Self-pay | Admitting: Internal Medicine

## 2017-03-02 ENCOUNTER — Telehealth: Payer: Self-pay | Admitting: *Deleted

## 2017-03-02 DIAGNOSIS — R7303 Prediabetes: Secondary | ICD-10-CM

## 2017-03-02 DIAGNOSIS — I10 Essential (primary) hypertension: Secondary | ICD-10-CM

## 2017-03-02 DIAGNOSIS — E785 Hyperlipidemia, unspecified: Secondary | ICD-10-CM

## 2017-03-02 NOTE — Telephone Encounter (Signed)
Patient has requested lab prior to her appt on 03/11/17. Please advise if a lab a ppt is needed  Pt contact [phone]

## 2017-03-02 NOTE — Telephone Encounter (Signed)
Do you want labs prior to the appt? Last labs were 06/23/16: Vita D, Lipid, LDL, HA1C and CMP.  Please advise, thanks

## 2017-03-03 ENCOUNTER — Ambulatory Visit
Admission: EM | Admit: 2017-03-03 | Discharge: 2017-03-03 | Disposition: A | Discharge status: Home / Self Care | Payer: Medicare Other | Attending: Family Medicine | Admitting: Family Medicine

## 2017-03-03 ENCOUNTER — Ambulatory Visit (INDEPENDENT_AMBULATORY_CARE_PROVIDER_SITE_OTHER): Payer: Medicare Other

## 2017-03-03 ENCOUNTER — Encounter: Payer: Self-pay | Admitting: *Deleted

## 2017-03-03 DIAGNOSIS — R05 Cough: Secondary | ICD-10-CM | POA: Diagnosis not present

## 2017-03-03 DIAGNOSIS — J45901 Unspecified asthma with (acute) exacerbation: Secondary | ICD-10-CM

## 2017-03-03 DIAGNOSIS — R0600 Dyspnea, unspecified: Secondary | ICD-10-CM

## 2017-03-03 DIAGNOSIS — R0602 Shortness of breath: Secondary | ICD-10-CM | POA: Diagnosis not present

## 2017-03-03 MED ORDER — IPRATROPIUM-ALBUTEROL 0.5-2.5 (3) MG/3ML IN SOLN
3.0000 mL | Freq: Four times a day (QID) | RESPIRATORY_TRACT | Status: DC
  Administered 2017-03-03: 3 mL via RESPIRATORY_TRACT

## 2017-03-03 MED ORDER — HYDROCOD POLST-CPM POLST ER 10-8 MG/5ML PO SUER: 5.0000 mL | Freq: Every evening | ORAL | 0 refills | Status: DC | PRN

## 2017-03-03 MED ORDER — SPACER/AERO CHAMBER MOUTHPIECE MISC: 0 refills | Status: DC

## 2017-03-03 MED ORDER — PREDNISONE 10 MG PO TABS: ORAL_TABLET | ORAL | 0 refills | Status: DC

## 2017-03-03 MED ORDER — ALBUTEROL SULFATE HFA 108 (90 BASE) MCG/ACT IN AERS: 2.0000 | INHALATION_SPRAY | RESPIRATORY_TRACT | 0 refills | Status: DC | PRN

## 2017-03-03 MED ORDER — PREDNISONE 50 MG PO TABS
60.0000 mg | ORAL_TABLET | Freq: Once | ORAL | Status: AC
  Administered 2017-03-03: 11:00:00 60 mg via ORAL

## 2017-03-03 NOTE — Telephone Encounter (Signed)
Ordered ,  Fasting appt needed

## 2017-03-03 NOTE — ED Triage Notes (Signed)
PAtient has had symptoms of cough and SOB for 1.5 months. She has been  Treated, but symptoms continue to worsen.

## 2017-03-03 NOTE — Discharge Instructions (Signed)
Take medication as prescribed. Rest. Drink plenty of fluids.   Follow up with your primary care physician this week. Return to Urgent care or Emergency room for new or worsening concerns.
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
## 2017-03-03 NOTE — Telephone Encounter (Signed)
Spoke with the patient and scheduled, thanks

## 2017-03-04 ENCOUNTER — Observation Stay (HOSPITAL_BASED_OUTPATIENT_CLINIC_OR_DEPARTMENT_OTHER)
Admit: 2017-03-04 | Discharge: 2017-03-04 | Disposition: A | Discharge status: Home / Self Care | Payer: Medicare Other | Attending: Internal Medicine | Admitting: Internal Medicine

## 2017-03-04 ENCOUNTER — Encounter: Payer: Self-pay | Admitting: *Deleted

## 2017-03-04 ENCOUNTER — Emergency Department: Payer: Medicare Other

## 2017-03-04 ENCOUNTER — Inpatient Hospital Stay
Admission: EM | Admit: 2017-03-04 | Discharge: 2017-03-10 | DRG: 193 | Disposition: A | Discharge status: Home / Self Care | Payer: Medicare Other | Attending: Internal Medicine | Admitting: Internal Medicine

## 2017-03-04 ENCOUNTER — Telehealth: Payer: Self-pay | Admitting: *Deleted

## 2017-03-04 ENCOUNTER — Telehealth: Payer: Self-pay | Admitting: Internal Medicine

## 2017-03-04 DIAGNOSIS — R0600 Dyspnea, unspecified: Secondary | ICD-10-CM

## 2017-03-04 DIAGNOSIS — J4521 Mild intermittent asthma with (acute) exacerbation: Secondary | ICD-10-CM

## 2017-03-04 DIAGNOSIS — Z7952 Long term (current) use of systemic steroids: Secondary | ICD-10-CM

## 2017-03-04 DIAGNOSIS — I10 Essential (primary) hypertension: Secondary | ICD-10-CM | POA: Diagnosis not present

## 2017-03-04 DIAGNOSIS — R0602 Shortness of breath: Secondary | ICD-10-CM | POA: Diagnosis not present

## 2017-03-04 DIAGNOSIS — I110 Hypertensive heart disease with heart failure: Secondary | ICD-10-CM | POA: Diagnosis present

## 2017-03-04 DIAGNOSIS — J45909 Unspecified asthma, uncomplicated: Secondary | ICD-10-CM | POA: Diagnosis not present

## 2017-03-04 DIAGNOSIS — J101 Influenza due to other identified influenza virus with other respiratory manifestations: Principal | ICD-10-CM | POA: Diagnosis present

## 2017-03-04 DIAGNOSIS — Z79899 Other long term (current) drug therapy: Secondary | ICD-10-CM

## 2017-03-04 DIAGNOSIS — Z7984 Long term (current) use of oral hypoglycemic drugs: Secondary | ICD-10-CM

## 2017-03-04 DIAGNOSIS — R0902 Hypoxemia: Secondary | ICD-10-CM

## 2017-03-04 DIAGNOSIS — E871 Hypo-osmolality and hyponatremia: Secondary | ICD-10-CM | POA: Diagnosis present

## 2017-03-04 DIAGNOSIS — R05 Cough: Secondary | ICD-10-CM | POA: Diagnosis not present

## 2017-03-04 DIAGNOSIS — E785 Hyperlipidemia, unspecified: Secondary | ICD-10-CM | POA: Diagnosis not present

## 2017-03-04 DIAGNOSIS — J45901 Unspecified asthma with (acute) exacerbation: Secondary | ICD-10-CM | POA: Diagnosis not present

## 2017-03-04 DIAGNOSIS — Z79891 Long term (current) use of opiate analgesic: Secondary | ICD-10-CM

## 2017-03-04 DIAGNOSIS — J9601 Acute respiratory failure with hypoxia: Secondary | ICD-10-CM | POA: Diagnosis not present

## 2017-03-04 DIAGNOSIS — J441 Chronic obstructive pulmonary disease with (acute) exacerbation: Secondary | ICD-10-CM | POA: Diagnosis present

## 2017-03-04 DIAGNOSIS — J189 Pneumonia, unspecified organism: Secondary | ICD-10-CM

## 2017-03-04 DIAGNOSIS — J4520 Mild intermittent asthma, uncomplicated: Secondary | ICD-10-CM | POA: Diagnosis present

## 2017-03-04 DIAGNOSIS — G4733 Obstructive sleep apnea (adult) (pediatric): Secondary | ICD-10-CM | POA: Diagnosis present

## 2017-03-04 DIAGNOSIS — E1165 Type 2 diabetes mellitus with hyperglycemia: Secondary | ICD-10-CM | POA: Diagnosis not present

## 2017-03-04 DIAGNOSIS — T380X5A Adverse effect of glucocorticoids and synthetic analogues, initial encounter: Secondary | ICD-10-CM | POA: Diagnosis not present

## 2017-03-04 DIAGNOSIS — Z6841 Body Mass Index (BMI) 40.0 and over, adult: Secondary | ICD-10-CM

## 2017-03-04 DIAGNOSIS — F329 Major depressive disorder, single episode, unspecified: Secondary | ICD-10-CM | POA: Diagnosis present

## 2017-03-04 DIAGNOSIS — J4551 Severe persistent asthma with (acute) exacerbation: Secondary | ICD-10-CM | POA: Diagnosis not present

## 2017-03-04 LAB — BASIC METABOLIC PANEL
Anion gap: 11 (ref 5–15)
BUN: 15 mg/dL (ref 6–20)
CALCIUM: 9.4 mg/dL (ref 8.9–10.3)
CO2: 26 mmol/L (ref 22–32)
CREATININE: 0.73 mg/dL (ref 0.44–1.00)
Chloride: 96 mmol/L — ABNORMAL LOW (ref 101–111)
GFR calc non Af Amer: >60 mL/min (ref >60)
Glucose, Bld: 162 mg/dL — ABNORMAL HIGH (ref 65–99)
Potassium: 3.9 mmol/L (ref 3.5–5.1)
SODIUM: 133 mmol/L — AB (ref 135–145)

## 2017-03-04 LAB — CBC WITH DIFFERENTIAL/PLATELET
BASOS PCT: 0 %
Basophils Absolute: 0 10*3/uL (ref 0–0.1)
EOS ABS: 0 10*3/uL (ref 0–0.7)
EOS PCT: 0 %
HCT: 37.9 % (ref 35.0–47.0)
Hemoglobin: 13.1 g/dL (ref 12.0–16.0)
LYMPHS ABS: 0.4 10*3/uL — AB (ref 1.0–3.6)
Lymphocytes Relative: 7 %
MCH: 31.9 pg (ref 26.0–34.0)
MCHC: 34.5 g/dL (ref 32.0–36.0)
MCV: 92.6 fL (ref 80.0–100.0)
MONO ABS: 0.4 10*3/uL (ref 0.2–0.9)
MONOS PCT: 7 %
Neutro Abs: 5.2 10*3/uL (ref 1.4–6.5)
Neutrophils Relative %: 86 %
Platelets: 187 10*3/uL (ref 150–440)
RBC: 4.09 MIL/uL (ref 3.80–5.20)
RDW: 14.1 % (ref 11.5–14.5)
WBC: 6 10*3/uL (ref 3.6–11.0)

## 2017-03-04 LAB — BLOOD GAS, VENOUS
Acid-Base Excess: 6 mmol/L — ABNORMAL HIGH (ref 0.0–2.0)
BICARBONATE: 33 mmol/L — AB (ref 20.0–28.0)
O2 SAT: 57.3 %
PATIENT TEMPERATURE: 37
PCO2 VEN: 57 mmHg (ref 44.0–60.0)
pH, Ven: 7.37 (ref 7.250–7.430)

## 2017-03-04 LAB — BRAIN NATRIURETIC PEPTIDE: B NATRIURETIC PEPTIDE 5: 205 pg/mL — AB (ref 0.0–100.0)

## 2017-03-04 LAB — TROPONIN I

## 2017-03-04 MED ORDER — METHYLPREDNISOLONE SODIUM SUCC 125 MG IJ SOLR
125.0000 mg | Freq: Once | INTRAMUSCULAR | Status: AC
  Administered 2017-03-04: 125 mg via INTRAVENOUS

## 2017-03-04 MED ORDER — ATORVASTATIN CALCIUM 20 MG PO TABS
20.0000 mg | ORAL_TABLET | Freq: Every day | ORAL | Status: DC
  Administered 2017-03-04 – 2017-03-10 (×7): 20 mg via ORAL
  Filled 2017-03-04 (×7): qty 1

## 2017-03-04 MED ORDER — ACYCLOVIR 400 MG PO TABS
400.0000 mg | ORAL_TABLET | Freq: Four times a day (QID) | ORAL | Status: DC | PRN
  Filled 2017-03-04: qty 1

## 2017-03-04 MED ORDER — LOSARTAN POTASSIUM 50 MG PO TABS
100.0000 mg | ORAL_TABLET | Freq: Every day | ORAL | Status: DC
  Administered 2017-03-05 – 2017-03-10 (×6): 100 mg via ORAL
  Filled 2017-03-04 (×6): qty 2

## 2017-03-04 MED ORDER — FLEET ENEMA 7-19 GM/118ML RE ENEM
1.0000 | ENEMA | Freq: Once | RECTAL | Status: DC | PRN
Start: 2017-03-04 — End: 2017-03-10

## 2017-03-04 MED ORDER — PAROXETINE HCL 20 MG PO TABS
20.0000 mg | ORAL_TABLET | Freq: Every morning | ORAL | Status: DC
  Administered 2017-03-05 – 2017-03-10 (×6): 20 mg via ORAL
  Filled 2017-03-04 (×6): qty 1

## 2017-03-04 MED ORDER — SENNOSIDES-DOCUSATE SODIUM 8.6-50 MG PO TABS
1.0000 | ORAL_TABLET | Freq: Every evening | ORAL | Status: DC | PRN
  Administered 2017-03-09: 1 via ORAL
  Filled 2017-03-04: qty 1

## 2017-03-04 MED ORDER — ONDANSETRON HCL 4 MG/2ML IJ SOLN: 4.0000 mg | Freq: Four times a day (QID) | INTRAMUSCULAR | Status: DC | PRN

## 2017-03-04 MED ORDER — PANTOPRAZOLE SODIUM 40 MG PO TBEC
40.0000 mg | DELAYED_RELEASE_TABLET | Freq: Every day | ORAL | Status: DC
  Administered 2017-03-05 – 2017-03-10 (×6): 40 mg via ORAL
  Filled 2017-03-04 (×6): qty 1

## 2017-03-04 MED ORDER — BISACODYL 5 MG PO TBEC: 5.0000 mg | DELAYED_RELEASE_TABLET | Freq: Every day | ORAL | Status: DC | PRN

## 2017-03-04 MED ORDER — ALPRAZOLAM 0.25 MG PO TABS
0.2500 mg | ORAL_TABLET | Freq: Two times a day (BID) | ORAL | Status: DC | PRN
  Administered 2017-03-05 – 2017-03-09 (×8): 0.25 mg via ORAL
  Filled 2017-03-04 (×8): qty 1

## 2017-03-04 MED ORDER — ACETAMINOPHEN 650 MG RE SUPP: 650.0000 mg | Freq: Four times a day (QID) | RECTAL | Status: DC | PRN

## 2017-03-04 MED ORDER — IPRATROPIUM-ALBUTEROL 0.5-2.5 (3) MG/3ML IN SOLN
3.0000 mL | Freq: Once | RESPIRATORY_TRACT | Status: AC
  Administered 2017-03-04: 3 mL via RESPIRATORY_TRACT

## 2017-03-04 MED ORDER — ONDANSETRON HCL 4 MG PO TABS: 4.0000 mg | ORAL_TABLET | Freq: Four times a day (QID) | ORAL | Status: DC | PRN

## 2017-03-04 MED ORDER — ENOXAPARIN SODIUM 40 MG/0.4ML ~~LOC~~ SOLN
40.0000 mg | Freq: Two times a day (BID) | SUBCUTANEOUS | Status: DC
  Administered 2017-03-04 – 2017-03-10 (×11): 40 mg via SUBCUTANEOUS
  Filled 2017-03-04 (×11): qty 0.4

## 2017-03-04 MED ORDER — IPRATROPIUM-ALBUTEROL 0.5-2.5 (3) MG/3ML IN SOLN
3.0000 mL | RESPIRATORY_TRACT | Status: DC
  Administered 2017-03-04 – 2017-03-10 (×33): 3 mL via RESPIRATORY_TRACT
  Filled 2017-03-04 (×33): qty 3

## 2017-03-04 MED ORDER — METHYLPREDNISOLONE SODIUM SUCC 125 MG IJ SOLR
60.0000 mg | Freq: Three times a day (TID) | INTRAMUSCULAR | Status: DC
  Administered 2017-03-04 – 2017-03-06 (×5): 60 mg via INTRAVENOUS
  Filled 2017-03-04 (×5): qty 2

## 2017-03-04 MED ORDER — TRIAMTERENE-HCTZ 37.5-25 MG PO TABS
1.0000 | ORAL_TABLET | Freq: Every day | ORAL | Status: DC
  Administered 2017-03-05 – 2017-03-10 (×6): 1 via ORAL
  Filled 2017-03-04 (×6): qty 1

## 2017-03-04 MED ORDER — ACETAMINOPHEN 325 MG PO TABS
650.0000 mg | ORAL_TABLET | Freq: Four times a day (QID) | ORAL | Status: DC | PRN
  Administered 2017-03-04 – 2017-03-10 (×8): 650 mg via ORAL
  Filled 2017-03-04 (×8): qty 2

## 2017-03-04 MED ORDER — FUROSEMIDE 40 MG PO TABS
40.0000 mg | ORAL_TABLET | Freq: Once | ORAL | Status: AC
  Administered 2017-03-04: 40 mg via ORAL
  Filled 2017-03-04: qty 1

## 2017-03-04 MED ORDER — HYDROCODONE-ACETAMINOPHEN 5-325 MG PO TABS
1.0000 | ORAL_TABLET | ORAL | Status: DC | PRN
  Administered 2017-03-05: 2 via ORAL
  Filled 2017-03-04: qty 2

## 2017-03-04 MED ORDER — TIOTROPIUM BROMIDE MONOHYDRATE 18 MCG IN CAPS
18.0000 ug | ORAL_CAPSULE | Freq: Every day | RESPIRATORY_TRACT | Status: DC
  Administered 2017-03-04 – 2017-03-10 (×6): 18 ug via RESPIRATORY_TRACT
  Filled 2017-03-04 (×2): qty 5

## 2017-03-04 MED ORDER — VITAMIN D 1000 UNITS PO TABS
1000.0000 [IU] | ORAL_TABLET | Freq: Every day | ORAL | Status: DC
  Administered 2017-03-05 – 2017-03-10 (×6): 1000 [IU] via ORAL
  Filled 2017-03-04 (×6): qty 1

## 2017-03-04 NOTE — Progress Notes (Signed)
Anticoagulation monitoring(Lovenox):  66 yo female ordered Lovenox 40 mg Q24h  Filed Weights   03/04/17 0937  Weight: (!) 340 lb (154.2 kg)   BMI 53.4   Lab Results  Component Value Date   CREATININE 0.73 03/04/2017   CREATININE 0.69 01/21/2017   CREATININE 0.60 06/23/2016   Estimated Creatinine Clearance: 109.1 mL/min (by C-G formula based on SCr of 0.73 mg/dL). Hemoglobin & Hematocrit     Component Value Date/Time   HGB 13.1 03/04/2017 1008   HCT 37.9 03/04/2017 1008     Per Protocol for Patient with estCrcl > 30 ml/min and BMI > 40, will transition to Lovenox 40 mg Q12h.
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
## 2017-03-04 NOTE — ED Triage Notes (Signed)
States SOB and wheezing for 1 month, states she has been on steroids and abx with rescue inhalers with no relief, states hx of asthma, pt audible wheezing, white productive cough
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
## 2017-03-04 NOTE — Telephone Encounter (Signed)
Patient was seen at urgent care on 04/03. She currently has a steroid taper , its not effective. She currently has symptoms of SOB,Oxygen level is 84-89 and a cough . Pt has a history of asthma. She requested a call or an appt with Dr. Derrel Nip only. Please give a time and date to place pt . Pt contact [phone]

## 2017-03-04 NOTE — Telephone Encounter (Signed)
Patient Name: Katie Huber  DOB: [birth year]    Initial Comment Caller states pt went to urgent care yesterday. Has shortness of breath, history of asthma, 02 is 84-89.    Nurse Assessment  Nurse: Leilani Merl, RN, Heather Date/Time (Eastern Time): 03/04/2017 8:44:59 AM  Confirm and document reason for call. If symptomatic, describe symptoms. ---Caller states pt went to urgent care yesterday. Has shortness of breath, history of asthma, 02 is 84-89.  Does the patient have any new or worsening symptoms? ---Yes  Will a triage be completed? ---Yes  Related visit to physician within the last 2 weeks? ---No  Does the PT have any chronic conditions? (i.e. diabetes, asthma, etc.) ---Yes  List chronic conditions. ---asthma  Is this a behavioral health or substance abuse call? ---No     Guidelines    Guideline Title Affirmed Question Affirmed Notes  Asthma Attack [1] Hospitalized before with asthma AND [2] feels the same now    Final Disposition User   Go to ED Now [place], RN, [facility] - ED   Disagree/Comply: Comply

## 2017-03-04 NOTE — Telephone Encounter (Signed)
Patient in ED now. 

## 2017-03-04 NOTE — Telephone Encounter (Signed)
Per chart patient is at ED now.

## 2017-03-05 ENCOUNTER — Telehealth: Payer: Self-pay | Admitting: *Deleted

## 2017-03-05 DIAGNOSIS — E785 Hyperlipidemia, unspecified: Secondary | ICD-10-CM | POA: Diagnosis not present

## 2017-03-05 DIAGNOSIS — J45901 Unspecified asthma with (acute) exacerbation: Secondary | ICD-10-CM | POA: Diagnosis not present

## 2017-03-05 DIAGNOSIS — J9601 Acute respiratory failure with hypoxia: Secondary | ICD-10-CM | POA: Diagnosis not present

## 2017-03-05 DIAGNOSIS — I10 Essential (primary) hypertension: Secondary | ICD-10-CM | POA: Diagnosis not present

## 2017-03-05 LAB — ECHOCARDIOGRAM COMPLETE
Height: 67 in
Weight: 5440 oz

## 2017-03-05 LAB — BASIC METABOLIC PANEL
Anion gap: 6 (ref 5–15)
BUN: 22 mg/dL — AB (ref 6–20)
CALCIUM: 9.3 mg/dL (ref 8.9–10.3)
CO2: 35 mmol/L — ABNORMAL HIGH (ref 22–32)
CREATININE: 0.83 mg/dL (ref 0.44–1.00)
Chloride: 94 mmol/L — ABNORMAL LOW (ref 101–111)
GFR calc Af Amer: >60 mL/min (ref >60)
GLUCOSE: 344 mg/dL — AB (ref 65–99)
POTASSIUM: 4 mmol/L (ref 3.5–5.1)
Sodium: 135 mmol/L (ref 135–145)

## 2017-03-05 LAB — GLUCOSE, CAPILLARY
GLUCOSE-CAPILLARY: 340 mg/dL — AB (ref 65–99)
Glucose-Capillary: 282 mg/dL — ABNORMAL HIGH (ref 65–99)
Glucose-Capillary: 284 mg/dL — ABNORMAL HIGH (ref 65–99)
Glucose-Capillary: 313 mg/dL — ABNORMAL HIGH (ref 65–99)

## 2017-03-05 MED ORDER — INSULIN ASPART 100 UNIT/ML ~~LOC~~ SOLN
3.0000 [IU] | Freq: Three times a day (TID) | SUBCUTANEOUS | Status: DC
  Administered 2017-03-05 – 2017-03-06 (×4): 3 [IU] via SUBCUTANEOUS
  Filled 2017-03-05 (×2): qty 3

## 2017-03-05 MED ORDER — GUAIFENESIN-DM 100-10 MG/5ML PO SYRP
5.0000 mL | ORAL_SOLUTION | ORAL | Status: DC | PRN
  Administered 2017-03-05 – 2017-03-08 (×5): 5 mL via ORAL
  Filled 2017-03-05 (×6): qty 5

## 2017-03-05 MED ORDER — INSULIN ASPART 100 UNIT/ML ~~LOC~~ SOLN
0.0000 [IU] | Freq: Every day | SUBCUTANEOUS | Status: DC
  Administered 2017-03-05: 4 [IU] via SUBCUTANEOUS
  Administered 2017-03-07: 2 [IU] via SUBCUTANEOUS
  Filled 2017-03-05: qty 2

## 2017-03-05 MED ORDER — HYDROCOD POLST-CPM POLST ER 10-8 MG/5ML PO SUER
5.0000 mL | Freq: Two times a day (BID) | ORAL | Status: DC | PRN
  Administered 2017-03-05 – 2017-03-10 (×9): 5 mL via ORAL
  Filled 2017-03-05 (×9): qty 5

## 2017-03-05 MED ORDER — INSULIN ASPART 100 UNIT/ML ~~LOC~~ SOLN
0.0000 [IU] | Freq: Three times a day (TID) | SUBCUTANEOUS | Status: DC
  Administered 2017-03-05: 7 [IU] via SUBCUTANEOUS
  Administered 2017-03-05 – 2017-03-06 (×3): 5 [IU] via SUBCUTANEOUS
  Administered 2017-03-06 (×2): 3 [IU] via SUBCUTANEOUS
  Administered 2017-03-07 (×2): 2 [IU] via SUBCUTANEOUS
  Administered 2017-03-07: 5 [IU] via SUBCUTANEOUS
  Administered 2017-03-08: 1 [IU] via SUBCUTANEOUS
  Administered 2017-03-08: 3 [IU] via SUBCUTANEOUS
  Administered 2017-03-08: 5 [IU] via SUBCUTANEOUS
  Administered 2017-03-09: 3 [IU] via SUBCUTANEOUS
  Administered 2017-03-09: 1 [IU] via SUBCUTANEOUS
  Filled 2017-03-05: qty 6
  Filled 2017-03-05: qty 2
  Filled 2017-03-05: qty 1
  Filled 2017-03-05: qty 5
  Filled 2017-03-05: qty 6
  Filled 2017-03-05: qty 7
  Filled 2017-03-05: qty 1
  Filled 2017-03-05 (×2): qty 5
  Filled 2017-03-05: qty 4
  Filled 2017-03-05: qty 5
  Filled 2017-03-05: qty 1
  Filled 2017-03-05: qty 8
  Filled 2017-03-05 (×2): qty 3
  Filled 2017-03-05: qty 5

## 2017-03-05 MED ORDER — BENZONATATE 100 MG PO CAPS
200.0000 mg | ORAL_CAPSULE | Freq: Three times a day (TID) | ORAL | Status: DC | PRN
  Administered 2017-03-05 – 2017-03-08 (×6): 200 mg via ORAL
  Filled 2017-03-05 (×6): qty 2

## 2017-03-05 MED ORDER — INSULIN GLARGINE 100 UNIT/ML ~~LOC~~ SOLN
23.0000 [IU] | Freq: Every day | SUBCUTANEOUS | Status: DC
  Administered 2017-03-05 – 2017-03-06 (×2): 23 [IU] via SUBCUTANEOUS
  Filled 2017-03-05 (×2): qty 0.23

## 2017-03-05 NOTE — Telephone Encounter (Signed)
Pt is currently admitted into [facility], she was advised to pick a pulmonary doctor, and would prefer Dr. Lupita Dawn opinion of a good pulmonary provider.  Pt contact [phone]

## 2017-03-05 NOTE — Evaluation (Signed)
Physical Therapy Evaluation Patient Details Name: Katie Huber MRN: [id number] DOB: 17-Feb-1951 Today's Date: 03/05/2017   History of Present Illness  Pt admitted for complaints of SOB and wheezing. Pt with history of asthma and obestiy. Pt with no home O2 at home.  Clinical Impression  Pt is a pleasant 66 year old female who was admitted for complaints of SOB and wheezing. Pt performs transfers with independence and ambulation with supervision and no AD. All mobility performed on 2L of O2 with sats >90%. Slight SOB symptoms secondary to coughing fits. No balance deficits, pt demonstrated ability to don pants while standing. Recommend RN initiative to start weaning of O2. Pt demonstrates all bed mobility/transfers/ambulation at baseline level. Pt does not require any further PT needs at this time. Pt will be dc in house and does not require follow up. RN aware. Will dc current orders.      Follow Up Recommendations No PT follow up    Equipment Recommendations  None recommended by PT    Recommendations for Other Services       Precautions / Restrictions Precautions Precautions: Fall Restrictions Weight Bearing Restrictions: No      Mobility  Bed Mobility               General bed mobility comments: not performed as pt received in recliner  Transfers Overall transfer level: Independent Equipment used: None             General transfer comment: safe technique performed without AD. All mobility performed on 2L of O2. Pt starts coughing with exertion.  Ambulation/Gait Ambulation/Gait assistance: Supervision Ambulation Distance (Feet): 180 Feet Assistive device: None Gait Pattern/deviations: WFL(Within Functional Limits)     General Gait Details: Pt ambulates with wide stance and short step length. Pt with slow gait speed secondary to coughing fits, leading to slight SOB symptoms. Mobility performed on 2L of O2 with sats at 98% decreasing to 90%. Sats improve with  pursed lip breathing.  Stairs            Wheelchair Mobility    Modified Rankin (Stroke Patients Only)       Balance Overall balance assessment: No apparent balance deficits (not formally assessed)                                           Pertinent Vitals/Pain Pain Assessment: No/denies pain    Home Living Family/patient expects to be discharged to:: Private residence Living Arrangements: Alone   Type of Home: House Home Access: Stairs to enter Entrance Stairs-Rails: Right Entrance Stairs-Number of Steps: 2 Home Layout: One level Home Equipment: None      Prior Function Level of Independence: Independent         Comments: indep at baseline. Does not use AD     Hand Dominance        Extremity/Trunk Assessment   Upper Extremity Assessment Upper Extremity Assessment: Overall WFL for tasks assessed    Lower Extremity Assessment Lower Extremity Assessment: Overall WFL for tasks assessed       Communication   Communication: No difficulties  Cognition Arousal/Alertness: Awake/alert Behavior During Therapy: WFL for tasks assessed/performed Overall Cognitive Status: Within Functional Limits for tasks assessed  General Comments      Exercises     Assessment/Plan    PT Assessment Patent does not need any further PT services  PT Problem List         PT Treatment Interventions      PT Goals (Current goals can be found in the Care Plan section)  Acute Rehab PT Goals Patient Stated Goal: to have less coughing PT Goal Formulation: All assessment and education complete, DC therapy Time For Goal Achievement: 03/05/17 Potential to Achieve Goals: Good    Frequency     Barriers to discharge        Co-evaluation               End of Session Equipment Utilized During Treatment: Oxygen Activity Tolerance: Patient tolerated treatment well Patient left: in  chair Nurse Communication: Mobility status PT Visit Diagnosis: Other abnormalities of gait and mobility (R26.89)    Time: 1050-1101 PT Time Calculation (min) (ACUTE ONLY): 11 min   Charges:   PT Evaluation $PT Eval Low Complexity: 1 Procedure     PT G Codes:   PT G-Codes **NOT FOR INPATIENT CLASS** Functional Assessment Tool Used: AM-PAC 6 Clicks Basic Mobility Functional Limitation: Mobility: Walking and moving around Mobility: Walking and Moving Around Current Status (Y8118): 0 percent impaired, limited or restricted Mobility: Walking and Moving Around Goal Status (A6773): 0 percent impaired, limited or restricted Mobility: Walking and Moving Around Discharge Status (P3668): 0 percent impaired, limited or restricted    Katie Huber, PT, DPT [phone]   Katie Huber 03/05/2017, 3:22 PM

## 2017-03-05 NOTE — Telephone Encounter (Signed)
Katie Huber

## 2017-03-05 NOTE — Progress Notes (Signed)
Inpatient Diabetes Program Recommendations  AACE/ADA: New Consensus Statement on Inpatient Glycemic Control (2015)  Target Ranges:  Prepandial:   less than 140 mg/dL      Peak postprandial:   less than 180 mg/dL (1-2 hours)      Critically ill patients:  140 - 180 mg/dL   Lab Results  Component Value Date   GLUCAP 340 (H) 03/05/2017   HGBA1C 6.4 06/23/2016    Review of Glycemic Control  Results for AUNE, ADAMI (MRN [id number]) as of 03/05/2017 09:20  Ref. Range 03/05/2017 07:59  Glucose-Capillary Latest Ref Range: 65 - 99 mg/dL 340 (H)    Diabetes history: Pre-diabetes 03/02/13 Outpatient Diabetes medications: none noted Current orders for Inpatient glycemic control: Novolog 0-9 units tid, Novolog 0-5 units qhs  *  Steroids 60mg  q8h  Inpatient Diabetes Program Recommendations:     Consider adding Lantus 23 units qday starting now (0.15unit/kg)  Add Novolog 3 units tid with meals (hold if patient eats less than 50%)  Increase Novolog correction to 0-20 units tid   Gentry Fitz, RN, [place], [place], CDE Diabetes Coordinator Inpatient Diabetes Program  [phone] (Team Pager) [phone] ([place]) 03/05/2017 9:25 AM

## 2017-03-05 NOTE — Telephone Encounter (Signed)
Please advise 

## 2017-03-05 NOTE — Progress Notes (Signed)
[facility] [place] NAME: Katie Huber    MR#:  [id number]  DATE OF BIRTH:  07/16/1951  SUBJECTIVE:  CHIEF COMPLAINT:   Chief Complaint  Patient presents with  . Wheezing   Very shortness breath and cough, on oxygen and it cannular 3 L. REVIEW OF SYSTEMS:  Review of Systems  Constitutional: Positive for malaise/fatigue. Negative for chills and fever.  HENT: Negative for congestion.   Eyes: Negative for blurred vision and double vision.  Respiratory: Positive for cough, shortness of breath and wheezing. Negative for hemoptysis, sputum production and stridor.   Cardiovascular: Negative for chest pain and leg swelling.  Gastrointestinal: Negative for abdominal pain, blood in stool, constipation, diarrhea, melena, nausea and vomiting.  Genitourinary: Negative for dysuria and hematuria.  Musculoskeletal: Negative for back pain.  Skin: Negative for itching and rash.  Neurological: Positive for weakness. Negative for dizziness, focal weakness and loss of consciousness.  Psychiatric/Behavioral: Negative for depression. The patient is not nervous/anxious.     DRUG ALLERGIES:  No Known Allergies VITALS:  Blood pressure (!) 158/71, pulse 68, temperature 98.3 F (36.8 C), temperature source Oral, resp. rate (!) 24, height 5\' 7"  (1.702 m), weight (!) 336 lb 3.2 oz (152.5 kg), SpO2 98 %. PHYSICAL EXAMINATION:  Physical Exam  Constitutional: She is oriented to person, place, and time.  Morbid obesity, mild respiratory distress.  HENT:  Head: Normocephalic.  Eyes: Conjunctivae and EOM are normal.  Neck: Normal range of motion. Neck supple. No JVD present. No tracheal deviation present. No thyromegaly present.  Cardiovascular: Normal rate, regular rhythm and normal heart sounds.  Exam reveals no gallop.   No murmur heard. Pulmonary/Chest: She is in respiratory distress. She has wheezes. She has no rales.  Very diminished lung sounds. Use of  muscles for breath.  Abdominal: Soft. Bowel sounds are normal. She exhibits no distension. There is no tenderness.  Musculoskeletal: She exhibits no edema or tenderness.  Neurological: She is alert and oriented to person, place, and time. No cranial nerve deficit.  Skin: No rash noted. No erythema.  Psychiatric: Affect and judgment normal.   LABORATORY PANEL:  Female CBC  Recent Labs Lab 03/04/17 1008  WBC 6.0  HGB 13.1  HCT 37.9  PLT 187   ------------------------------------------------------------------------------------------------------------------ Chemistries   Recent Labs Lab 03/05/17 0433  NA 135  K 4.0  CL 94*  CO2 35*  GLUCOSE 344*  BUN 22*  CREATININE 0.83  CALCIUM 9.3   RADIOLOGY:  No results found. ASSESSMENT AND PLAN:   66 year old female with a history of adult onset mild intermittent asthma and morbid obesity who presents with acute hypoxic respiratory failure due to asthma exacerbation.  1.Acute hypoxic respiratory failure Due to COPD exacerbation Wean oxygen as tolerated Echocardiogram is pending.  2. COPD exacerbation: She has history of asthma. She has very diminished WITH WHEEZING. She may have COPD. She needs pulmonary physician follow-up as outpatient.  continue IV steroids DuoNeb's and inhaler.Robitussin when necessary  Patient will need long-acting beta agonist/steroid inhaler discharge  3 Morbid obesity: Encouraged weight loss as tolerated  4. Essential hypertension: Continue losartan and Maxide  5. Hyponatremia, mild: improved. 6. Hyperlipidemia: Continue Lipitor  7. Depression: Continue Paxil  Hyperglycemia. Start lantus, NovoLog 3 units before meals meals, and SL. Check hemoglobin A1c. Weakness. Generalized. PT evaluation. All the records are reviewed and case discussed with Care Management/Social Worker. Management plans discussed with the patient, family and they are in agreement.  CODE STATUS: Full Code  TOTAL TIME  TAKING CARE OF THIS PATIENT: 37 minutes.   More than 50% of the time was spent in counseling/coordination of care: YES  POSSIBLE D/C IN 2 DAYS, DEPENDING ON CLINICAL CONDITION.   Demetrios Loll M.D on 03/05/2017 at 2:13 PM  Between 7am to 6pm - Pager - [phone]  After 6pm go to [email or web address] - Proofreader  [facility] [place]  Office  [phone]  CC: Primary care physician; [name], MD  Note: This dictation was prepared with Dragon dictation along with smaller phrase technology. Any transcriptional errors that result from this process are unintentional.

## 2017-03-06 ENCOUNTER — Other Ambulatory Visit: Payer: Medicare Other

## 2017-03-06 DIAGNOSIS — R0602 Shortness of breath: Secondary | ICD-10-CM | POA: Diagnosis not present

## 2017-03-06 DIAGNOSIS — J189 Pneumonia, unspecified organism: Secondary | ICD-10-CM | POA: Diagnosis not present

## 2017-03-06 DIAGNOSIS — Z7984 Long term (current) use of oral hypoglycemic drugs: Secondary | ICD-10-CM | POA: Diagnosis not present

## 2017-03-06 DIAGNOSIS — J101 Influenza due to other identified influenza virus with other respiratory manifestations: Secondary | ICD-10-CM | POA: Diagnosis not present

## 2017-03-06 DIAGNOSIS — G4733 Obstructive sleep apnea (adult) (pediatric): Secondary | ICD-10-CM | POA: Diagnosis present

## 2017-03-06 DIAGNOSIS — E1165 Type 2 diabetes mellitus with hyperglycemia: Secondary | ICD-10-CM | POA: Diagnosis not present

## 2017-03-06 DIAGNOSIS — J441 Chronic obstructive pulmonary disease with (acute) exacerbation: Secondary | ICD-10-CM | POA: Diagnosis present

## 2017-03-06 DIAGNOSIS — Z7952 Long term (current) use of systemic steroids: Secondary | ICD-10-CM | POA: Diagnosis not present

## 2017-03-06 DIAGNOSIS — Z6841 Body Mass Index (BMI) 40.0 and over, adult: Secondary | ICD-10-CM | POA: Diagnosis not present

## 2017-03-06 DIAGNOSIS — Z79891 Long term (current) use of opiate analgesic: Secondary | ICD-10-CM | POA: Diagnosis not present

## 2017-03-06 DIAGNOSIS — I110 Hypertensive heart disease with heart failure: Secondary | ICD-10-CM | POA: Diagnosis present

## 2017-03-06 DIAGNOSIS — F329 Major depressive disorder, single episode, unspecified: Secondary | ICD-10-CM | POA: Diagnosis present

## 2017-03-06 DIAGNOSIS — J45901 Unspecified asthma with (acute) exacerbation: Secondary | ICD-10-CM | POA: Diagnosis not present

## 2017-03-06 DIAGNOSIS — R05 Cough: Secondary | ICD-10-CM | POA: Diagnosis not present

## 2017-03-06 DIAGNOSIS — Z79899 Other long term (current) drug therapy: Secondary | ICD-10-CM | POA: Diagnosis not present

## 2017-03-06 DIAGNOSIS — I10 Essential (primary) hypertension: Secondary | ICD-10-CM | POA: Diagnosis not present

## 2017-03-06 DIAGNOSIS — E871 Hypo-osmolality and hyponatremia: Secondary | ICD-10-CM | POA: Diagnosis present

## 2017-03-06 DIAGNOSIS — J9601 Acute respiratory failure with hypoxia: Secondary | ICD-10-CM | POA: Diagnosis not present

## 2017-03-06 DIAGNOSIS — E785 Hyperlipidemia, unspecified: Secondary | ICD-10-CM | POA: Diagnosis present

## 2017-03-06 DIAGNOSIS — T380X5A Adverse effect of glucocorticoids and synthetic analogues, initial encounter: Secondary | ICD-10-CM | POA: Diagnosis not present

## 2017-03-06 LAB — HEMOGLOBIN A1C
HEMOGLOBIN A1C: 7.6 % — AB (ref 4.8–5.6)
Mean Plasma Glucose: 171 mg/dL

## 2017-03-06 LAB — GLUCOSE, CAPILLARY
GLUCOSE-CAPILLARY: 158 mg/dL — AB (ref 65–99)
Glucose-Capillary: 253 mg/dL — ABNORMAL HIGH (ref 65–99)
Glucose-Capillary: 338 mg/dL — ABNORMAL HIGH (ref 65–99)
Glucose-Capillary: 248 mg/dL — ABNORMAL HIGH (ref 65–99)

## 2017-03-06 MED ORDER — PREDNISONE 50 MG PO TABS
50.0000 mg | ORAL_TABLET | Freq: Every day | ORAL | Status: DC
  Administered 2017-03-07 – 2017-03-08 (×2): 50 mg via ORAL
  Filled 2017-03-06 (×2): qty 1

## 2017-03-06 MED ORDER — ZOLPIDEM TARTRATE 5 MG PO TABS
5.0000 mg | ORAL_TABLET | Freq: Every evening | ORAL | Status: DC | PRN
  Administered 2017-03-06 – 2017-03-09 (×4): 5 mg via ORAL
  Filled 2017-03-06 (×4): qty 1

## 2017-03-06 MED ORDER — ALBUTEROL SULFATE (2.5 MG/3ML) 0.083% IN NEBU
2.5000 mg | INHALATION_SOLUTION | RESPIRATORY_TRACT | Status: DC | PRN
  Administered 2017-03-06 – 2017-03-08 (×2): 2.5 mg via RESPIRATORY_TRACT
  Filled 2017-03-06: qty 3

## 2017-03-06 MED ORDER — INSULIN GLARGINE 100 UNIT/ML ~~LOC~~ SOLN
28.0000 [IU] | Freq: Every day | SUBCUTANEOUS | Status: DC
  Administered 2017-03-07 – 2017-03-10 (×4): 28 [IU] via SUBCUTANEOUS
  Filled 2017-03-06 (×5): qty 0.28

## 2017-03-06 MED ORDER — ALBUTEROL SULFATE (2.5 MG/3ML) 0.083% IN NEBU
INHALATION_SOLUTION | RESPIRATORY_TRACT | Status: AC
  Administered 2017-03-06: 2.5 mg via RESPIRATORY_TRACT
  Filled 2017-03-06: qty 3

## 2017-03-06 MED ORDER — INSULIN ASPART 100 UNIT/ML ~~LOC~~ SOLN
5.0000 [IU] | Freq: Three times a day (TID) | SUBCUTANEOUS | Status: DC
  Administered 2017-03-06 – 2017-03-09 (×7): 5 [IU] via SUBCUTANEOUS
  Filled 2017-03-06 (×6): qty 5

## 2017-03-06 MED ORDER — MOMETASONE FURO-FORMOTEROL FUM 100-5 MCG/ACT IN AERO
2.0000 | INHALATION_SPRAY | Freq: Two times a day (BID) | RESPIRATORY_TRACT | Status: DC
  Administered 2017-03-06 – 2017-03-08 (×4): 2 via RESPIRATORY_TRACT
  Filled 2017-03-06: qty 8.8

## 2017-03-06 NOTE — Progress Notes (Addendum)
Inpatient Diabetes Program Recommendations  AACE/ADA: New Consensus Statement on Inpatient Glycemic Control (2015)  Target Ranges:  Prepandial:   less than 140 mg/dL      Peak postprandial:   less than 180 mg/dL (1-2 hours)      Critically ill patients:  140 - 180 mg/dL   Lab Results  Component Value Date   GLUCAP 253 (H) 03/06/2017   HGBA1C 7.6 (H) 03/05/2017   Results for Katie Huber, Katie Huber (MRN [id number]) as of 03/06/2017 10:38  Ref. Range 03/05/2017 11:22 03/05/2017 17:01 03/05/2017 21:06 03/06/2017 07:47  Glucose-Capillary Latest Ref Range: 65 - 99 mg/dL 282 (H) 284 (H) 313 (H) 253 (H)    Diabetes history: Pre-diabetes 03/02/13  Outpatient Diabetes medications: none noted  Current orders for Inpatient glycemic control: Novolog 0-9 units tid, Novolog 0-5 units qhs, Lantus 23 units qday,  Novolog 3 units tid with meals (hold if patient eats less than 50%)  *  Steroids 50mg  qam  Inpatient Diabetes Program Recommendations:     Noted that steroids were decreased today.   Consider increasing Novolog to resistant correction 0-20 units tid and increase Lantus to 28 units qam.   Increase mealtime Novolog to 5 units tid.  A1C 7.6% - she has been on steroids of recently.  Based on American Diabetes Association Guidelines, A1C greater than 6.4% is a diagnosis of diabetes.  Please discuss with patient if appropriate.  Gentry Fitz, RN, BA, MHA, CDE Diabetes Coordinator Inpatient Diabetes Program  [phone] (Team Pager) [phone] ([place]) 03/06/2017 10:45 AM

## 2017-03-06 NOTE — Progress Notes (Signed)
Patient seen for scheduled nebulizer treatment. Patient presents dyspnea, airway wheezing and congestion, bilateral lung wheezing and need to sit up at side of bed.  Patient has been on room air with saturations in 90's.  Patient states breathing has gotten worse as night has progressed. svn given with minimal relief. Spoke with Dr Jannifer Franklin about this patient.  Prn albuterol treatment given. Patient looks some better. o2 applied at 2 liters due to sob.

## 2017-03-06 NOTE — Progress Notes (Addendum)
[place] at [place] NAME: Katie Huber    MR#:  [id number]  DATE OF BIRTH:  [birth year]  SUBJECTIVE:  CHIEF COMPLAINT:   Chief Complaint  Patient presents with  . Wheezing   still shortness breath and cough, on oxygen and it cannular 2 L. REVIEW OF SYSTEMS:  Review of Systems  Constitutional: Positive for malaise/fatigue. Negative for chills and fever.  HENT: Negative for congestion.   Eyes: Negative for blurred vision and double vision.  Respiratory: Positive for cough, shortness of breath and wheezing. Negative for hemoptysis, sputum production and stridor.   Cardiovascular: Negative for chest pain and leg swelling.  Gastrointestinal: Negative for abdominal pain, blood in stool, constipation, diarrhea, melena, nausea and vomiting.  Genitourinary: Negative for dysuria and hematuria.  Musculoskeletal: Negative for back pain.  Skin: Negative for itching and rash.  Neurological: Positive for weakness. Negative for dizziness, focal weakness and loss of consciousness.  Psychiatric/Behavioral: Negative for depression. The patient is not nervous/anxious.     DRUG ALLERGIES:  No Known Allergies VITALS:  Blood pressure (!) 140/50, pulse 69, temperature 98 F (36.7 C), temperature source Oral, resp. rate 19, height 5\' 7"  (1.702 m), weight (!) 332 lb 3.2 oz (150.7 kg), SpO2 95 %. PHYSICAL EXAMINATION:  Physical Exam  Constitutional: She is oriented to person, place, and time.  Morbid obesity, mild respiratory distress.  HENT:  Head: Normocephalic.  Eyes: Conjunctivae and EOM are normal.  Neck: Normal range of motion. Neck supple. No JVD present. No tracheal deviation present. No thyromegaly present.  Cardiovascular: Normal rate, regular rhythm and normal heart sounds.  Exam reveals no gallop.   No murmur heard. Pulmonary/Chest: No respiratory distress. She has wheezes. She has no rales.  Very diminished lung sounds. Use of muscles for  breath.  Abdominal: Soft. Bowel sounds are normal. She exhibits no distension. There is no tenderness.  Musculoskeletal: She exhibits no edema or tenderness.  Neurological: She is alert and oriented to person, place, and time. No cranial nerve deficit.  Skin: No rash noted. No erythema.  Psychiatric: Affect and judgment normal.   LABORATORY PANEL:  Female CBC  Recent Labs Lab 03/04/17 1008  WBC 6.0  HGB 13.1  HCT 37.9  PLT 187   ------------------------------------------------------------------------------------------------------------------ Chemistries   Recent Labs Lab 03/05/17 0433  NA 135  K 4.0  CL 94*  CO2 35*  GLUCOSE 344*  BUN 22*  CREATININE 0.83  CALCIUM 9.3   RADIOLOGY:  No results found. ASSESSMENT AND PLAN:   66 year old female with a history of adult onset mild intermittent asthma and morbid obesity who presents with acute hypoxic respiratory failure due to asthma exacerbation.  1.Acute hypoxic respiratory failure Due to COPD exacerbation Wean oxygen as tolerated Echocardiogram is normal.  2. COPD exacerbation: She has history of asthma. She has very diminished lung sound WITH WHEEZING. She has COPD. She needs pulmonary physician follow-up as outpatient.  discontinue IV steroids, change to po prednisone. Taper steroid due to hyperglycemia. DuoNeb's and inhaler.Robitussin when necessary. spiriva and add dulera. Patient will need long-acting beta agonist/steroid inhaler discharge  3 Morbid obesity: Encouraged weight loss as tolerated  4. Essential hypertension: Continue losartan and Maxide  5. Hyponatremia, mild: improved. 6. Hyperlipidemia: Continue Lipitor  7. Depression: Continue Paxil  Hyperglycemia due to steroid and DM2. BS is still high. Started lantus 23 units daily, increase to 28 units, increase NovoLog to 5 units before meals meals, and continue SL. hemoglobin A1c 7.6.  Weakness. Generalized. PT evaluation: No PT follow up. All  the records are reviewed and case discussed with Care Management/Social Worker. Management plans discussed with the patient, family and they are in agreement.  CODE STATUS: Full Code  TOTAL TIME TAKING CARE OF THIS PATIENT: 36 minutes.   More than 50% of the time was spent in counseling/coordination of care: YES  POSSIBLE D/C IN 2 DAYS, DEPENDING ON CLINICAL CONDITION.   Demetrios Loll M.D on 03/06/2017 at 1:52 PM  Between 7am to 6pm - Pager - [phone]  After 6pm go to [email or web address] - Proofreader  [facility]  Office  [phone]  CC: Primary care physician; [name], MD  Note: This dictation was prepared with Dragon dictation along with smaller phrase technology. Any transcriptional errors that result from this process are unintentional.

## 2017-03-07 LAB — GLUCOSE, CAPILLARY
GLUCOSE-CAPILLARY: 300 mg/dL — AB (ref 65–99)
GLUCOSE-CAPILLARY: 213 mg/dL — AB (ref 65–99)
Glucose-Capillary: 134 mg/dL — ABNORMAL HIGH (ref 65–99)
Glucose-Capillary: 191 mg/dL — ABNORMAL HIGH (ref 65–99)

## 2017-03-07 MED ORDER — DM-GUAIFENESIN ER 30-600 MG PO TB12
1.0000 | ORAL_TABLET | Freq: Two times a day (BID) | ORAL | Status: DC
  Filled 2017-03-07: qty 1

## 2017-03-07 MED ORDER — GUAIFENESIN 200 MG PO TABS
600.0000 mg | ORAL_TABLET | Freq: Two times a day (BID) | ORAL | Status: DC
  Administered 2017-03-07 (×2): 600 mg via ORAL
  Filled 2017-03-07 (×3): qty 3

## 2017-03-07 MED ORDER — LACTULOSE 10 GM/15ML PO SOLN
20.0000 g | Freq: Two times a day (BID) | ORAL | Status: DC | PRN
  Filled 2017-03-07 (×2): qty 30

## 2017-03-07 MED ORDER — DOCUSATE SODIUM 100 MG PO CAPS
100.0000 mg | ORAL_CAPSULE | Freq: Two times a day (BID) | ORAL | Status: DC
  Administered 2017-03-07 – 2017-03-09 (×6): 100 mg via ORAL
  Filled 2017-03-07 (×7): qty 1

## 2017-03-07 MED ORDER — ORAL CARE MOUTH RINSE
15.0000 mL | Freq: Two times a day (BID) | OROMUCOSAL | Status: DC
Start: 2017-03-07 — End: 2017-03-10
  Administered 2017-03-07 – 2017-03-09 (×3): 15 mL via OROMUCOSAL

## 2017-03-07 MED ORDER — DEXTROMETHORPHAN POLISTIREX ER 30 MG/5ML PO SUER
30.0000 mg | Freq: Two times a day (BID) | ORAL | Status: DC
  Administered 2017-03-07 (×2): 30 mg via ORAL
  Filled 2017-03-07 (×3): qty 5

## 2017-03-07 NOTE — Progress Notes (Addendum)
RN approached pt.'s room and pt was rocking in her chair, stating she could not catch her breath, and could not stop coughing, pt. Is coughing up light brown sputum. Pt.'s is having bilateral wheezing. O2 stats were 95 on room air. Dulera inhaler and Tussionex was given at 2114, pt states that tessalon does not work . Respiratory was also at bedside, prime doctor was notified for PRN nebulizer treatments. At 2330 pt.'s coughing was better and O2 stats were still 95 on room air. Pt still anxious, PRN xanax was given to pt. Will continue to monitor pt closely.   Maxima Skelton [employer]

## 2017-03-07 NOTE — Progress Notes (Signed)
[place] at [place] NAME: Katie Huber    MR#:  [id number]  DATE OF BIRTH:  January 04, 1951  SUBJECTIVE:  CHIEF COMPLAINT:   Chief Complaint  Patient presents with  . Wheezing   still shortness breath and cough a lot, on oxygen and it cannular 2 L. REVIEW OF SYSTEMS:  Review of Systems  Constitutional: Positive for malaise/fatigue. Negative for chills and fever.  HENT: Negative for congestion.   Eyes: Negative for blurred vision and double vision.  Respiratory: Positive for cough, shortness of breath and wheezing. Negative for hemoptysis, sputum production and stridor.   Cardiovascular: Negative for chest pain and leg swelling.  Gastrointestinal: Negative for abdominal pain, blood in stool, constipation, diarrhea, melena, nausea and vomiting.  Genitourinary: Negative for dysuria and hematuria.  Musculoskeletal: Negative for back pain.  Skin: Negative for itching and rash.  Neurological: Positive for weakness. Negative for dizziness, focal weakness and loss of consciousness.  Psychiatric/Behavioral: Negative for depression. The patient is not nervous/anxious.     DRUG ALLERGIES:  No Known Allergies VITALS:  Blood pressure (!) 158/86, pulse 68, temperature 98 F (36.7 C), temperature source Oral, resp. rate (!) 22, height 5\' 7"  (1.702 m), weight (!) 334 lb 6.4 oz (151.7 kg), SpO2 92 %. PHYSICAL EXAMINATION:  Physical Exam  Constitutional: She is oriented to person, place, and time.  Morbid obesity  HENT:  Head: Normocephalic.  Eyes: Conjunctivae and EOM are normal.  Neck: Normal range of motion. Neck supple. No JVD present. No tracheal deviation present. No thyromegaly present.  Cardiovascular: Normal rate, regular rhythm and normal heart sounds.  Exam reveals no gallop.   No murmur heard. Pulmonary/Chest: No respiratory distress. She has wheezes. She has no rales.  diminished lung sounds.   Abdominal: Soft. Bowel sounds are  normal. She exhibits no distension. There is no tenderness.  Musculoskeletal: She exhibits no edema or tenderness.  Neurological: She is alert and oriented to person, place, and time. No cranial nerve deficit.  Skin: No rash noted. No erythema.  Psychiatric: Affect and judgment normal.   LABORATORY PANEL:  Female CBC  Recent Labs Lab 03/04/17 1008  WBC 6.0  HGB 13.1  HCT 37.9  PLT 187   ------------------------------------------------------------------------------------------------------------------ Chemistries   Recent Labs Lab 03/05/17 0433  NA 135  K 4.0  CL 94*  CO2 35*  GLUCOSE 344*  BUN 22*  CREATININE 0.83  CALCIUM 9.3   RADIOLOGY:  No results found. ASSESSMENT AND PLAN:   66 year old female with a history of adult onset mild intermittent asthma and morbid obesity who presents with acute hypoxic respiratory failure due to asthma exacerbation.  1.Acute hypoxic respiratory failure Due to COPD exacerbation Wean oxygen as tolerated Echocardiogram is normal.  2. COPD exacerbation: She has history of asthma. She has very diminished lung sound WITH WHEEZING. She has COPD. She needs pulmonary physician follow-up as outpatient.  discontinued IV steroids, changed to po prednisone. Taper steroid due to hyperglycemia. DuoNeb's and inhaler.Robitussin when necessary, mucinex, tessalon and tussionex prn. spiriva and added dulera. Patient will need long-acting beta agonist/steroid inhaler discharge  3 Morbid obesity: Encouraged weight loss as tolerated  4. Essential hypertension: Continue losartan and Maxide  5. Hyponatremia, mild: improved. 6. Hyperlipidemia: Continue Lipitor  7. Depression: Continue Paxil  Hyperglycemia due to steroid and DM2. BS is still high. Started lantus 23 units daily, increased to 28 units, increase NovoLog to 5 units before meals meals, and continue SL. hemoglobin A1c 7.6.  Weakness. Generalized. PT evaluation: No PT follow up. All  the records are reviewed and case discussed with Care Management/Social Worker. Management plans discussed with the patient, family and they are in agreement.  CODE STATUS: Full Code  TOTAL TIME TAKING CARE OF THIS PATIENT: 36 minutes.   More than 50% of the time was spent in counseling/coordination of care: YES  POSSIBLE D/C IN 2 DAYS, DEPENDING ON CLINICAL CONDITION.   Demetrios Loll M.D on 03/07/2017 at 1:25 PM  Between 7am to 6pm - Pager - [phone]  After 6pm go to [email or web address] - Proofreader  [facility]  Office  [phone]  CC: Primary care physician; [name], MD  Note: This dictation was prepared with Dragon dictation along with smaller phrase technology. Any transcriptional errors that result from this process are unintentional.

## 2017-03-08 ENCOUNTER — Inpatient Hospital Stay: Payer: Medicare Other

## 2017-03-08 LAB — GLUCOSE, CAPILLARY
GLUCOSE-CAPILLARY: 212 mg/dL — AB (ref 65–99)
GLUCOSE-CAPILLARY: 271 mg/dL — AB (ref 65–99)
GLUCOSE-CAPILLARY: 154 mg/dL — AB (ref 65–99)
Glucose-Capillary: 132 mg/dL — ABNORMAL HIGH (ref 65–99)

## 2017-03-08 LAB — INFLUENZA PANEL BY PCR (TYPE A & B)
Influenza A By PCR: POSITIVE — AB
Influenza B By PCR: NEGATIVE

## 2017-03-08 MED ORDER — OSELTAMIVIR PHOSPHATE 75 MG PO CAPS
75.0000 mg | ORAL_CAPSULE | Freq: Two times a day (BID) | ORAL | Status: DC
  Administered 2017-03-08 – 2017-03-10 (×5): 75 mg via ORAL
  Filled 2017-03-08 (×5): qty 1

## 2017-03-08 MED ORDER — PREDNISONE 20 MG PO TABS
40.0000 mg | ORAL_TABLET | Freq: Every day | ORAL | Status: DC
  Administered 2017-03-09 – 2017-03-10 (×2): 40 mg via ORAL
  Filled 2017-03-08 (×2): qty 2

## 2017-03-08 MED ORDER — DEXTROMETHORPHAN POLISTIREX ER 30 MG/5ML PO SUER
30.0000 mg | Freq: Two times a day (BID) | ORAL | Status: DC
  Administered 2017-03-08: 30 mg via ORAL
  Filled 2017-03-08: qty 5

## 2017-03-08 MED ORDER — BUDESONIDE 0.25 MG/2ML IN SUSP
0.2500 mg | Freq: Two times a day (BID) | RESPIRATORY_TRACT | Status: DC
  Administered 2017-03-08 – 2017-03-10 (×4): 0.25 mg via RESPIRATORY_TRACT
  Filled 2017-03-08 (×5): qty 2

## 2017-03-08 MED ORDER — GUAIFENESIN ER 600 MG PO TB12
600.0000 mg | ORAL_TABLET | Freq: Two times a day (BID) | ORAL | Status: DC
  Administered 2017-03-08: 600 mg via ORAL
  Filled 2017-03-08: qty 1

## 2017-03-08 MED ORDER — METHYLPREDNISOLONE SODIUM SUCC 125 MG IJ SOLR
60.0000 mg | Freq: Two times a day (BID) | INTRAMUSCULAR | Status: DC
  Filled 2017-03-08: qty 2

## 2017-03-08 NOTE — Progress Notes (Addendum)
[place] at [place] NAME: Katie Huber    MR#:  [id number]  DATE OF BIRTH:  08-15-1951  SUBJECTIVE:  CHIEF COMPLAINT:   Chief Complaint  Patient presents with  . Wheezing   still shortness breath and cough, on oxygen and it cannular 2 L. REVIEW OF SYSTEMS:  Review of Systems  Constitutional: Positive for malaise/fatigue. Negative for chills and fever.  HENT: Negative for congestion.   Eyes: Negative for blurred vision and double vision.  Respiratory: Positive for cough, shortness of breath and wheezing. Negative for hemoptysis, sputum production and stridor.   Cardiovascular: Negative for chest pain and leg swelling.  Gastrointestinal: Negative for abdominal pain, blood in stool, constipation, diarrhea, melena, nausea and vomiting.  Genitourinary: Negative for dysuria and hematuria.  Musculoskeletal: Negative for back pain.  Skin: Negative for itching and rash.  Neurological: Positive for weakness. Negative for dizziness, focal weakness and loss of consciousness.  Psychiatric/Behavioral: Negative for depression. The patient is not nervous/anxious.     DRUG ALLERGIES:  No Known Allergies VITALS:  Blood pressure (!) 126/57, pulse 62, temperature 98.4 F (36.9 C), temperature source Oral, resp. rate (!) 22, height 5\' 7"  (1.702 m), weight (!) 336 lb 8 oz (152.6 kg), SpO2 94 %. PHYSICAL EXAMINATION:  Physical Exam  Constitutional: She is oriented to person, place, and time.  Morbid obesity  HENT:  Head: Normocephalic.  Eyes: Conjunctivae and EOM are normal.  Neck: Normal range of motion. Neck supple. No JVD present. No tracheal deviation present. No thyromegaly present.  Cardiovascular: Normal rate, regular rhythm and normal heart sounds.  Exam reveals no gallop.   No murmur heard. Pulmonary/Chest: Effort normal. No respiratory distress. She has wheezes. She has no rales.  diminished lung sounds. Rhonchi.  Abdominal: Soft. Bowel  sounds are normal. She exhibits no distension. There is no tenderness.  Musculoskeletal: She exhibits no edema or tenderness.  Neurological: She is alert and oriented to person, place, and time. No cranial nerve deficit.  Skin: No rash noted. No erythema.  Psychiatric: Affect and judgment normal.   LABORATORY PANEL:  Female CBC  Recent Labs Lab 03/04/17 1008  WBC 6.0  HGB 13.1  HCT 37.9  PLT 187   ------------------------------------------------------------------------------------------------------------------ Chemistries   Recent Labs Lab 03/05/17 0433  NA 135  K 4.0  CL 94*  CO2 35*  GLUCOSE 344*  BUN 22*  CREATININE 0.83  CALCIUM 9.3   RADIOLOGY:  Dg Chest 2 View  Result Date: 03/08/2017 CLINICAL DATA:  Continue shortness of breath and cough. EXAM: CHEST  2 VIEW COMPARISON:  March 04, 2017 FINDINGS: Cardiomegaly. The hila and mediastinum are normal. No pulmonary nodules, masses, or focal infiltrates. No overt pulmonary edema. IMPRESSION: No active cardiopulmonary disease. Electronically Signed   By: Dorise Bullion III M.D   On: 03/08/2017 11:49   ASSESSMENT AND PLAN:   66 year old female with a history of adult onset mild intermittent asthma and morbid obesity who presents with acute hypoxic respiratory failure due to asthma exacerbation.  1.Acute hypoxic respiratory failure Due to COPD exacerbation and Influenza A. Wean oxygen as tolerated, start tamiflu. Echocardiogram is normal.  2. COPD exacerbation:  pulmonary physician consult. Discontinued IV steroids, on prednisone taper. continue DuoNeb's and inhaler.Robitussin when necessary,  tussionex prn. spiriva and add Pulmicort. Patient will need long-acting beta agonist/steroid inhaler discharge  3 Morbid obesity: Encouraged weight loss as tolerated  4. Essential hypertension: Continue losartan and Maxide  5. Hyponatremia, mild: improved.  6. Hyperlipidemia: Continue Lipitor  7. Depression: Continue  Paxil  Hyperglycemia due to steroid and DM2. BS is still high. Started lantus 23 units daily, increased to 28 units, increased NovoLog to 5 units before meals meals, and continue SL. hemoglobin A1c 7.6.  Weakness. Generalized. PT evaluation: No PT follow up. All the records are reviewed and case discussed with Care Management/Social Worker. Management plans discussed with the patient, family and they are in agreement.  CODE STATUS: Full Code  TOTAL TIME TAKING CARE OF THIS PATIENT: 37 minutes.   More than 50% of the time was spent in counseling/coordination of care: YES  POSSIBLE D/C IN 2 DAYS, DEPENDING ON CLINICAL CONDITION.   Demetrios Loll M.D on 03/08/2017 at 1:58 PM  Between 7am to 6pm - Pager - [phone]  After 6pm go to [email or web address] - Proofreader  [facility]  Office  [phone]  CC: Primary care physician; [name], MD  Note: This dictation was prepared with Dragon dictation along with smaller phrase technology. Any transcriptional errors that result from this process are unintentional.

## 2017-03-08 NOTE — Progress Notes (Signed)
Patient needed another breathing tx due to severe wheezing and SOB. Called MD to get order for flu test.

## 2017-03-09 LAB — CREATININE, SERUM: CREATININE: 0.74 mg/dL (ref 0.44–1.00)

## 2017-03-09 LAB — CBC
HCT: 38.6 % (ref 35.0–47.0)
Hemoglobin: 13 g/dL (ref 12.0–16.0)
MCH: 31.5 pg (ref 26.0–34.0)
MCHC: 33.8 g/dL (ref 32.0–36.0)
MCV: 93.3 fL (ref 80.0–100.0)
Platelets: 196 10*3/uL (ref 150–440)
RBC: 4.14 MIL/uL (ref 3.80–5.20)
RDW: 14.4 % (ref 11.5–14.5)
WBC: 5.7 10*3/uL (ref 3.6–11.0)

## 2017-03-09 LAB — GLUCOSE, CAPILLARY
GLUCOSE-CAPILLARY: 100 mg/dL — AB (ref 65–99)
GLUCOSE-CAPILLARY: 132 mg/dL — AB (ref 65–99)
Glucose-Capillary: 237 mg/dL — ABNORMAL HIGH (ref 65–99)
Glucose-Capillary: 183 mg/dL — ABNORMAL HIGH (ref 65–99)

## 2017-03-09 MED ORDER — LORATADINE 10 MG PO TABS
10.0000 mg | ORAL_TABLET | Freq: Every day | ORAL | Status: DC
  Administered 2017-03-10: 10 mg via ORAL
  Filled 2017-03-09 (×2): qty 1

## 2017-03-09 MED ORDER — FLUTICASONE PROPIONATE 50 MCG/ACT NA SUSP
2.0000 | Freq: Every day | NASAL | Status: DC
  Administered 2017-03-10: 2 via NASAL
  Filled 2017-03-09: qty 16

## 2017-03-09 MED ORDER — MONTELUKAST SODIUM 10 MG PO TABS
10.0000 mg | ORAL_TABLET | Freq: Every day | ORAL | Status: DC
  Administered 2017-03-09: 10 mg via ORAL
  Filled 2017-03-09: qty 1

## 2017-03-09 NOTE — Plan of Care (Signed)
Problem: Pain Managment: Goal: General experience of comfort will improve Outcome: Progressing Mild rib cage soreness r/t coughing improved with prescribed medications.

## 2017-03-09 NOTE — Progress Notes (Signed)
[place] at [place] NAME: Katie Huber    MR#:  [id number]  DATE OF BIRTH:  19-Dec-1950  SUBJECTIVE:  CHIEF COMPLAINT:   Chief Complaint  Patient presents with  . Wheezing   still shortness breath and cough, on oxygen and it cannular 2 L. REVIEW OF SYSTEMS:  Review of Systems  Constitutional: Positive for malaise/fatigue. Negative for chills and fever.  HENT: Negative for congestion.   Eyes: Negative for blurred vision and double vision.  Respiratory: Positive for cough, shortness of breath and wheezing. Negative for hemoptysis, sputum production and stridor.   Cardiovascular: Negative for chest pain and leg swelling.  Gastrointestinal: Negative for abdominal pain, blood in stool, constipation, diarrhea, melena, nausea and vomiting.  Genitourinary: Negative for dysuria and hematuria.  Musculoskeletal: Negative for back pain.  Skin: Negative for itching and rash.  Neurological: Positive for weakness. Negative for dizziness, focal weakness and loss of consciousness.  Psychiatric/Behavioral: Negative for depression. The patient is not nervous/anxious.     DRUG ALLERGIES:  No Known Allergies VITALS:  Blood pressure (!) 137/51, pulse 64, temperature 97.5 F (36.4 C), temperature source Oral, resp. rate 20, height 5\' 7"  (1.702 m), weight (!) 334 lb 12.8 oz (151.9 kg), SpO2 98 %. PHYSICAL EXAMINATION:  Physical Exam  Constitutional: She is oriented to person, place, and time.  Morbid obesity  HENT:  Head: Normocephalic.  Eyes: Conjunctivae and EOM are normal.  Neck: Normal range of motion. Neck supple. No JVD present. No tracheal deviation present. No thyromegaly present.  Cardiovascular: Normal rate, regular rhythm and normal heart sounds.  Exam reveals no gallop.   No murmur heard. Pulmonary/Chest: Effort normal. No respiratory distress. She has wheezes. She has no rales.  diminished lung sounds. Rhonchi.  Abdominal: Soft. Bowel  sounds are normal. She exhibits no distension. There is no tenderness.  Musculoskeletal: She exhibits no edema or tenderness.  Neurological: She is alert and oriented to person, place, and time. No cranial nerve deficit.  Skin: No rash noted. No erythema.  Psychiatric: Affect and judgment normal.   LABORATORY PANEL:  Female CBC  Recent Labs Lab 03/09/17 0333  WBC 5.7  HGB 13.0  HCT 38.6  PLT 196   ------------------------------------------------------------------------------------------------------------------ Chemistries   Recent Labs Lab 03/05/17 0433 03/09/17 0333  NA 135  --   K 4.0  --   CL 94*  --   CO2 35*  --   GLUCOSE 344*  --   BUN 22*  --   CREATININE 0.83 0.74  CALCIUM 9.3  --    RADIOLOGY:  No results found. ASSESSMENT AND PLAN:   66 year old female with a history of adult onset mild intermittent asthma and morbid obesity who presents with acute hypoxic respiratory failure due to asthma exacerbation.  1.Acute hypoxic respiratory failure Due to COPD exacerbation and Influenza A. Wean oxygen as tolerated, started tamiflu. Echocardiogram is normal.  2. COPD exacerbation:  pulmonary physician consult. Discontinued IV steroids, on prednisone taper. continue DuoNeb's and inhaler.Robitussin when necessary,  tussionex prn. spiriva and added Pulmicort. Add Singulair.  Patient will need long-acting beta agonist/steroid inhaler discharge  3 Morbid obesity: Encouraged weight loss as tolerated  4. Essential hypertension: Continue losartan and Maxide  5. Hyponatremia, mild: improved. 6. Hyperlipidemia: Continue Lipitor  7. Depression: Continue Paxil  Hyperglycemia due to steroid and DM2. BS is controlled. Started lantus 23 units daily, increased to 28 units, increased NovoLog to 5 units before meals meals, and continue SL.  hemoglobin A1c 7.6.  Weakness. Generalized. PT evaluation: No PT follow up. All the records are reviewed and case discussed with  Care Management/Social Worker. Management plans discussed with the patient, family and they are in agreement.  CODE STATUS: Full Code  TOTAL TIME TAKING CARE OF THIS PATIENT: 33 minutes.   More than 50% of the time was spent in counseling/coordination of care: YES  POSSIBLE D/C IN 2 DAYS, DEPENDING ON CLINICAL CONDITION.   Demetrios Loll M.D on 03/09/2017 at 2:26 PM  Between 7am to 6pm - Pager - [phone]  After 6pm go to [email or web address] - Proofreader  [facility]  Office  [phone]  CC: Primary care physician; [name], MD  Note: This dictation was prepared with Dragon dictation along with smaller phrase technology. Any transcriptional errors that result from this process are unintentional.

## 2017-03-10 ENCOUNTER — Telehealth: Payer: Self-pay | Admitting: Internal Medicine

## 2017-03-10 LAB — GLUCOSE, CAPILLARY: GLUCOSE-CAPILLARY: 104 mg/dL — AB (ref 65–99)

## 2017-03-10 MED ORDER — METFORMIN HCL 500 MG PO TABS: 500.0000 mg | ORAL_TABLET | Freq: Two times a day (BID) | ORAL | 2 refills | Status: DC

## 2017-03-10 MED ORDER — OSELTAMIVIR PHOSPHATE 75 MG PO CAPS: 75.0000 mg | ORAL_CAPSULE | Freq: Two times a day (BID) | ORAL | 0 refills | Status: DC

## 2017-03-10 MED ORDER — LIVING WELL WITH DIABETES BOOK
Freq: Once | Status: DC
  Filled 2017-03-10: qty 1

## 2017-03-10 MED ORDER — PREDNISONE 10 MG PO TABS: ORAL_TABLET | ORAL | 0 refills | Status: DC

## 2017-03-10 MED ORDER — FLUTICASONE-SALMETEROL 250-50 MCG/DOSE IN AEPB: 1.0000 | INHALATION_SPRAY | Freq: Two times a day (BID) | RESPIRATORY_TRACT | 2 refills | Status: DC

## 2017-03-10 MED ORDER — TIOTROPIUM BROMIDE MONOHYDRATE 18 MCG IN CAPS: 18.0000 ug | ORAL_CAPSULE | Freq: Every day | RESPIRATORY_TRACT | 2 refills | Status: DC

## 2017-03-10 MED ORDER — HYDROCOD POLST-CPM POLST ER 10-8 MG/5ML PO SUER: 5.0000 mL | Freq: Two times a day (BID) | ORAL | 0 refills | Status: DC | PRN

## 2017-03-10 NOTE — Progress Notes (Signed)
Inpatient Diabetes Program Recommendations  AACE/ADA: New Consensus Statement on Inpatient Glycemic Control (2015)  Target Ranges:  Prepandial:   less than 140 mg/dL      Peak postprandial:   less than 180 mg/dL (1-2 hours)      Critically ill patients:  140 - 180 mg/dL   Lab Results  Component Value Date   GLUCAP 104 (H) 03/10/2017   HGBA1C 7.6 (H) 03/05/2017    Spoke with patient regarding elevated A1C and that she will d/c home on Metformin per MD.   Patient states that she has been on steroids for the past 6 weeks and feels this has influenced A1C.  She states that her PCP has been watching A1C and that she has been borderline.  She has not felt well during the past 6 weeks due to her illness.  Discussed mechanism of action of metformin and to take with meals bid.  Also encouraged her to discuss A1C with PCP and need for follow-up A1C in 3 months.   Ordered LWWD booklet for patient and discussed diabetes survival skills.  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  Did briefly discuss monitoring and Reli-on meter for checking blood sugars PRN.   Thanks, Adah Perl, RN, BC-ADM Inpatient Diabetes Coordinator Pager [phone] (8a-5p)

## 2017-03-10 NOTE — Care Management (Signed)
SATURATION QUALIFICATIONS: (This note is used to comply with regulatory documentation for home oxygen)  Patient Saturations on Room Air at Rest = 97%  Patient Saturations on Room Air while Ambulating = 93%  

## 2017-03-10 NOTE — Progress Notes (Signed)
Inpatient Diabetes Program Recommendations  AACE/ADA: New Consensus Statement on Inpatient Glycemic Control (2015)  Target Ranges:  Prepandial:   less than 140 mg/dL      Peak postprandial:   less than 180 mg/dL (1-2 hours)      Critically ill patients:  140 - 180 mg/dL   Lab Results  Component Value Date   GLUCAP 104 (H) 03/10/2017   HGBA1C 7.6 (H) 03/05/2017    Review of Glycemic Control:  Results for SUMMERLYNN, GLAUSER (MRN [id number]) as of 03/10/2017 10:32  Ref. Range 03/09/2017 07:35 03/09/2017 11:50 03/09/2017 16:57 03/09/2017 21:15 03/10/2017 07:23  Glucose-Capillary Latest Ref Range: 65 - 99 mg/dL 100 (H) 132 (H) 237 (H) 183 (H) 104 (H)    Diabetes history: None- history of steroid induced hyperglycemia Outpatient Diabetes medications: None Current orders for Inpatient glycemic control:  Lantus 28 units daily, Novolog 5 units tid with meals, Novolog sensitive tid with meals and HS Prednisone 40 mg daily Inpatient Diabetes Program Recommendations:    Blood sugars improved with steroid taper and insulin.  Discussed with RN on 4/9 and she states that patient says she does "not" have diabetes and was on steroids up to 1 month prior to admit which may effect A1C level?  MD please advise if this is new diagnosis of diabetes and if education is appropriate?  Also please consider reduction of Lantus to 14 units q HS.  Based on A1C, patient will not need insulin at d/c but may benefit from the addition of oral agent if appropriate.  Needs follow-up with PCP.   Thanks, Adah Perl, RN, BC-ADM Inpatient Diabetes Coordinator Pager [phone] (8a-5p)
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
## 2017-03-10 NOTE — Discharge Summary (Signed)
[facility] [place] NAME: Katie Huber    MR#:  [id number]  DATE OF BIRTH:  1951/03/10  DATE OF ADMISSION:  03/04/2017   ADMITTING PHYSICIAN: Bettey Costa, MD  DATE OF DISCHARGE: 03/10/2017 PRIMARY CARE PHYSICIAN: [name], MD   ADMISSION DIAGNOSIS:  Pneumonia [J18.9] Hypoxia [R09.02] Exacerbation of intermittent asthma, unspecified asthma severity [J45.21] DISCHARGE DIAGNOSIS:  Active Problems:   Asthma   Acute respiratory failure with hypoxia (HCC) .Acute hypoxic respiratory failure Due to Asthma/COPD exacerbation and Influenza A. Diabetes. SECONDARY DIAGNOSIS:   Past Medical History:  Diagnosis Date  . Asthma 1990  . Obesity, Class III, BMI 40-49.9 (morbid obesity) (HCC)    weight fluctuations of 100 lbs    HOSPITAL COURSE:   66 year old female with a history of adult onset mild intermittent asthma and morbid obesity who presents with acute hypoxic respiratory failure due to asthma exacerbation.  1.Acute hypoxic respiratory failure Due to Asthma/COPD exacerbation and Influenza A. Wean oxygen as tolerated, started tamiflu. Continue Tamiflu for 2 more days. Echocardiogram is normal.  2. COPD exacerbation:  pulmonary physician consult. Discontinued IV steroids, on prednisone taper. continue DuoNeb's and inhaler.Robitussin when necessary,  tussionex prn. spiriva and added Pulmicort. Added Singulair.  Patient will need long-acting beta agonist/steroid inhaler discharge Per Dr. Humphrey Rolls, the patient needs. Taper steroids, Spiriva and dulera, may be discharged today and follow-up as outpatient.  3Morbid obesity: Encouraged weight loss as tolerated  4. Essential hypertension: Continue losartan and Maxide  5. Hyponatremia, mild: improved. 6. Hyperlipidemia: Continue Lipitor  7. Depression: Continue Paxil  Hyperglycemia due to steroid and DM2. BS is controlled. Started lantus 23 units daily, increased to 28  units, increased NovoLog to 5 units before meals meals, and continue SL. hemoglobin A1c 7.6. The patient wants to try metformin first after discharge.  Weakness. Generalized. PT evaluation: No PT follow up. I discussed with Dr. Humphrey Rolls. DISCHARGE CONDITIONS:  Stable, discharge to home today. CONSULTS OBTAINED:  Treatment Team:  Allyne Gee, MD DRUG ALLERGIES:  No Known Allergies DISCHARGE MEDICATIONS:   Allergies as of 03/10/2017   No Known Allergies     Medication List    TAKE these medications   acyclovir 400 MG tablet Commonly known as:  ZOVIRAX TAKE ONE TABLET BY MOUTH  FIVE TIMES DAILY   albuterol 108 (90 Base) MCG/ACT inhaler Commonly known as:  PROVENTIL HFA;VENTOLIN HFA Inhale 2 puffs into the lungs every 4 (four) hours as needed.   atorvastatin 20 MG tablet Commonly known as:  LIPITOR Take 1 tablet (20 mg total) by mouth daily.   chlorpheniramine-HYDROcodone 10-8 MG/5ML Suer Commonly known as:  TUSSIONEX Take 5 mLs by mouth every 12 (twelve) hours as needed for cough. What changed:  when to take this  additional instructions   cholecalciferol 1000 units tablet Commonly known as:  VITAMIN D Take 1,000 Units by mouth daily.   Fluticasone-Salmeterol 250-50 MCG/DOSE Aepb Commonly known as:  ADVAIR DISKUS Inhale 1 puff into the lungs 2 (two) times daily.   furosemide 20 MG tablet Commonly known as:  LASIX TAKE 1 TABLET BY MOUTH  DAILY AS NEEDED FOR FLUID  RETENTION   losartan 100 MG tablet Commonly known as:  COZAAR TAKE 1 TABLET BY MOUTH  DAILY   metFORMIN 500 MG tablet Commonly known as:  GLUCOPHAGE Take 1 tablet (500 mg total) by mouth 2 (two) times daily with a meal.   omeprazole 20 MG capsule Commonly known as:  PRILOSEC TAKE 1 CAPSULE BY MOUTH TWO TIMES DAILY   ondansetron 4 MG disintegrating tablet Commonly known as:  ZOFRAN ODT Take 1 tablet (4 mg total) by mouth every 8 (eight) hours as needed.   oseltamivir 75 MG capsule Commonly  known as:  TAMIFLU Take 1 capsule (75 mg total) by mouth 2 (two) times daily.   oxyCODONE-acetaminophen 5-325 MG tablet Commonly known as:  PERCOCET/ROXICET Take 1-2 tablets by mouth every 6 (six) hours as needed for severe pain. Do not drive or operate machinery while taking as can cause drowsiness.   PARoxetine 20 MG tablet Commonly known as:  PAXIL TAKE 1 TABLET BY MOUTH  EVERY MORNING   potassium chloride SA 20 MEQ tablet Commonly known as:  K-DUR,KLOR-CON TAKE 1 TABLET BY MOUTH  DAILY WHEN USING FUROSEMIDE   predniSONE 10 MG tablet Commonly known as:  DELTASONE 40 mg po daily for 2 days, 20 mg po daily for 2 days, 40 mg po daily for 3 days. What changed:  additional instructions   promethazine 25 MG tablet Commonly known as:  PHENERGAN Take 1 tablet (25 mg total) by mouth every 8 (eight) hours as needed for nausea or vomiting.   Spacer/Aero Chamber [employer] Use with inhaler   tiotropium 18 MCG inhalation capsule Commonly known as:  SPIRIVA Place 1 capsule (18 mcg total) into inhaler and inhale daily. Start taking on:  03/11/2017   triamterene-hydrochlorothiazide 37.5-25 MG tablet Commonly known as:  MAXZIDE-25 TAKE 1 TABLET BY MOUTH  DAILY        DISCHARGE INSTRUCTIONS:  See AVS.  If you experience worsening of your admission symptoms, develop shortness of breath, life threatening emergency, suicidal or homicidal thoughts you must seek medical attention immediately by calling 911 or calling your MD immediately  if symptoms less severe.  You Must read complete instructions/literature along with all the possible adverse reactions/side effects for all the Medicines you take and that have been prescribed to you. Take any new Medicines after you have completely understood and accpet all the possible adverse reactions/side effects.   Please note  You were cared for by a hospitalist during your hospital stay. If you have any questions about your discharge  medications or the care you received while you were in the hospital after you are discharged, you can call the unit and asked to speak with the hospitalist on call if the hospitalist that took care of you is not available. Once you are discharged, your primary care physician will handle any further medical issues. Please note that NO REFILLS for any discharge medications will be authorized once you are discharged, as it is imperative that you return to your primary care physician (or establish a relationship with a primary care physician if you do not have one) for your aftercare needs so that they can reassess your need for medications and monitor your lab values.    On the day of Discharge:  VITAL SIGNS:  Blood pressure (!) 142/55, pulse (!) 52, temperature 97.6 F (36.4 C), temperature source Oral, resp. rate 20, height 5\' 7"  (1.702 m), weight (!) 334 lb 12.8 oz (151.9 kg), SpO2 100 %. PHYSICAL EXAMINATION:  GENERAL:  66 y.o.-year-old patient lying in the bed with no acute distress. Morbidly obese. EYES: Pupils equal, round, reactive to light and accommodation. No scleral icterus. Extraocular muscles intact.  HEENT: Head atraumatic, normocephalic. Oropharynx and nasopharynx clear.  NECK:  Supple, no jugular venous distention. No thyroid enlargement, no tenderness.  LUNGS: Normal  breath sounds bilaterally, has wheezing while coughing, no rales,rhonchi or crepitation. No use of accessory muscles of respiration.  CARDIOVASCULAR: S1, S2 normal. No murmurs, rubs, or gallops.  ABDOMEN: Soft, non-tender, non-distended. Bowel sounds present. No organomegaly or mass.  EXTREMITIES: No pedal edema, cyanosis, or clubbing.  NEUROLOGIC: Cranial nerves II through XII are intact. Muscle strength 5/5 in all extremities. Sensation intact. Gait not checked.  PSYCHIATRIC: The patient is alert and oriented x 3.  SKIN: No obvious rash, lesion, or ulcer.  DATA REVIEW:   CBC  Recent Labs Lab 03/09/17 0333  WBC  5.7  HGB 13.0  HCT 38.6  PLT 196    Chemistries   Recent Labs Lab 03/05/17 0433 03/09/17 0333  NA 135  --   K 4.0  --   CL 94*  --   CO2 35*  --   GLUCOSE 344*  --   BUN 22*  --   CREATININE 0.83 0.74  CALCIUM 9.3  --      Microbiology Results  Results for orders placed or performed in visit on 09/28/13  Fecal occult blood, imunochemical     Status: None   Collection Time: 09/28/13 12:13 PM  Result Value Ref Range Status   Fecal Occult Bld Negative Negative Final    RADIOLOGY:  No results found.   Management plans discussed with the patient, family and they are in agreement.  CODE STATUS: Full Code   TOTAL TIME TAKING CARE OF THIS PATIENT: 37 minutes.    Demetrios Loll M.D on 03/10/2017 at 1:56 PM  Between 7am to 6pm - Pager - [phone]  After 6pm go to [email or web address] - Proofreader  [facility]  Office  [phone]  CC: Primary care physician; [name], MD   Note: This dictation was prepared with Dragon dictation along with smaller phrase technology. Any transcriptional errors that result from this process are unintentional.

## 2017-03-10 NOTE — Telephone Encounter (Signed)
HFU/Tishia called from [facility]. Pt is being discharged today. Dx was Asthma. No appt avail to sch. Thank you!

## 2017-03-10 NOTE — Progress Notes (Signed)
Patient discharged to home as ordered. Patient given prescriptions and follow up appointments as ordered. Patient states that she will be leaving this afternoon when her daughter gets a break at work. Patient is alert and oriented ambulates without assistance. No acute distress noted.

## 2017-03-10 NOTE — Discharge Instructions (Signed)
Heart healthy diet

## 2017-03-11 ENCOUNTER — Ambulatory Visit (INDEPENDENT_AMBULATORY_CARE_PROVIDER_SITE_OTHER): Payer: Medicare Other | Admitting: Internal Medicine

## 2017-03-11 ENCOUNTER — Telehealth: Payer: Self-pay | Admitting: *Deleted

## 2017-03-11 ENCOUNTER — Encounter: Payer: Self-pay | Admitting: Internal Medicine

## 2017-03-11 VITALS — BP 136/68 | HR 62 | Temp 97.5°F | Resp 15 | Ht 67.0 in | Wt 333.2 lb

## 2017-03-11 DIAGNOSIS — R7303 Prediabetes: Secondary | ICD-10-CM

## 2017-03-11 DIAGNOSIS — J9601 Acute respiratory failure with hypoxia: Secondary | ICD-10-CM

## 2017-03-11 DIAGNOSIS — J45909 Unspecified asthma, uncomplicated: Secondary | ICD-10-CM | POA: Diagnosis not present

## 2017-03-11 DIAGNOSIS — G4733 Obstructive sleep apnea (adult) (pediatric): Secondary | ICD-10-CM

## 2017-03-11 DIAGNOSIS — Z09 Encounter for follow-up examination after completed treatment for conditions other than malignant neoplasm: Secondary | ICD-10-CM

## 2017-03-11 MED ORDER — ZOLPIDEM TARTRATE 10 MG PO TABS: 10.0000 mg | ORAL_TABLET | Freq: Every evening | ORAL | 1 refills | Status: DC | PRN

## 2017-03-11 MED ORDER — FLUCONAZOLE 150 MG PO TABS: 150.0000 mg | ORAL_TABLET | Freq: Every day | ORAL | 0 refills | Status: DC

## 2017-03-11 MED ORDER — HYDROCOD POLST-CPM POLST ER 10-8 MG/5ML PO SUER: 5.0000 mL | Freq: Two times a day (BID) | ORAL | 0 refills | Status: DC | PRN

## 2017-03-11 NOTE — Telephone Encounter (Signed)
Please give a tie and a date to place pt for a three week follow up. Pt contact [phone]

## 2017-03-11 NOTE — Patient Instructions (Addendum)
6 tablets twice daily for 2 days,  Then once daily for 2 days ,   Then taper by 1 tablet daily until gone    Your sleep study was positive for SEVERE apnea ,  We Will  arrange titration study ASAP

## 2017-03-11 NOTE — Telephone Encounter (Signed)
yes

## 2017-03-11 NOTE — Telephone Encounter (Signed)
Transition Care Management Follow-up Telephone Call  How have you been since you were released from the hospital? Yes, feeling better.   Do you understand why you were in the hospital? Yes.   Do you understand the discharge instrcutions? Yes  Items Reviewed:  Medications reviewed: Yes  Allergies reviewed:Yes  Dietary changes reviewed: Yes  Referrals reviewed: Yes, Pulmonary  On 03/16/17   Functional Questionnaire:   Activities of Daily Living (ADLs):   She states they are independent in the following:Patient is independent in ADLS States they require assistance with the following: No assist   Any transportation issues/concerns?: No   Any patient concerns? NO   Confirmed importance and date/time of follow-up visits scheduled: yes   Confirmed with patient if condition begins to worsen call PCP or go to the ER.  Patient was given the Call-a-Nurse line [phone]

## 2017-03-11 NOTE — Telephone Encounter (Signed)
First , attempt made for TCM left message for patient to return call to office

## 2017-03-11 NOTE — Progress Notes (Signed)
Subjective:  Patient ID: Katie Huber, female    DOB: 18-Apr-1951  Age: 66 y.o. MRN: [id number]  CC: The primary encounter diagnosis was Obstructive sleep apnea syndrome. Diagnoses of OSA (obstructive sleep apnea), Acute respiratory failure with hypoxia [facility]), Hospital discharge follow-up, and Prediabetes were also pertinent to this visit.  HPI Katie Huber presents for [place].  She was  admitted  To [facility] on April 4 with acute hypoxic respiratory failure secondary to asthma exacerbation and influenza A.   She was treated for 5 days without invasive  ventilation and released on April 10 with a steroid taper and a follow up with Dr Jobe Igo.  Her appointment with him is on  Monday .  She is feeling somewhat better.  Still tired,  Still wheezing.  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  Sleep study was repeated  in December but the results were not seen until today .  She has severe OSA, AHI 52 with desats.    Lab Results  Component Value Date   HGBA1C 7.6 (H) 03/05/2017   Had been on steroids for 5 weeks prior to a1c being drawn    Outpatient Medications Prior to Visit  Medication Sig Dispense Refill  . acyclovir (ZOVIRAX) 400 MG tablet TAKE ONE TABLET BY MOUTH  FIVE TIMES DAILY 90 tablet 1  . albuterol (PROVENTIL HFA;VENTOLIN HFA) 108 (90 Base) MCG/ACT inhaler Inhale 2 puffs into the lungs every 4 (four) hours as needed. 1 Inhaler 0  . atorvastatin (LIPITOR) 20 MG tablet Take 1 tablet (20 mg total) by mouth daily. 90 tablet 3  . Fluticasone-Salmeterol (ADVAIR DISKUS) 250-50 MCG/DOSE AEPB Inhale 1 puff into the lungs 2 (two) times daily. 60 each 2  . furosemide (LASIX) 20 MG tablet TAKE 1 TABLET BY MOUTH  DAILY AS NEEDED FOR FLUID  RETENTION 90 tablet 0  . losartan (COZAAR) 100 MG tablet TAKE 1 TABLET BY MOUTH  DAILY 90 tablet 1  . metFORMIN (GLUCOPHAGE) 500  MG tablet Take 1 tablet (500 mg total) by mouth 2 (two) times daily with a meal. 60 tablet 2  . omeprazole (PRILOSEC) 20 MG capsule TAKE 1 CAPSULE BY MOUTH TWO TIMES DAILY 180 capsule 1  . oseltamivir (TAMIFLU) 75 MG capsule Take 1 capsule (75 mg total) by mouth 2 (two) times daily. 4 capsule 0  . PARoxetine (PAXIL) 20 MG tablet TAKE 1 TABLET BY MOUTH  EVERY MORNING 90 tablet 0  . potassium chloride SA (K-DUR,KLOR-CON) 20 MEQ tablet TAKE 1 TABLET BY MOUTH  DAILY WHEN USING FUROSEMIDE 90 tablet 1  . predniSONE (DELTASONE) 10 MG tablet 40 mg po daily for 2 days, 20 mg po daily for 2 days, 40 mg po daily for 3 days. 15 tablet 0  . promethazine (PHENERGAN) 25 MG tablet Take 1 tablet (25 mg total) by mouth every 8 (eight) hours as needed for nausea or vomiting. 90 tablet 0  . tiotropium (SPIRIVA) 18 MCG inhalation capsule Place 1 capsule (18 mcg total) into inhaler and inhale daily. 30 capsule 2  . triamterene-hydrochlorothiazide (MAXZIDE-25) 37.5-25 MG tablet TAKE 1 TABLET BY MOUTH  DAILY 90 tablet 1  . chlorpheniramine-HYDROcodone (TUSSIONEX) 10-8 MG/5ML SUER Take 5 mLs by mouth every 12 (twelve) hours as needed for cough. 115 mL 0  . cholecalciferol (VITAMIN D) 1000 UNITS tablet Take 1,000 Units by  mouth daily.    . ondansetron (ZOFRAN ODT) 4 MG disintegrating tablet Take 1 tablet (4 mg total) by mouth every 8 (eight) hours as needed. (Patient not taking: Reported on 03/11/2017) 15 tablet 0  . oxyCODONE-acetaminophen (PERCOCET/ROXICET) 5-325 MG tablet Take 1-2 tablets by mouth every 6 (six) hours as needed for severe pain. Do not drive or operate machinery while taking as can cause drowsiness. (Patient not taking: Reported on 03/04/2017) 12 tablet 0  . Spacer/Aero Chamber [employer] Use with inhaler (Patient not taking: Reported on 03/11/2017) 1 each 0   Facility-Administered Medications Prior to Visit  Medication Dose Route Frequency Provider Last Rate Last Dose  . pneumococcal 13-valent conjugate  vaccine (PREVNAR 13) injection 0.5 mL  0.5 mL Intramuscular Once [name], MD        Review of Systems;  Patient denies headache, fevers, malaise, unintentional weight loss, skin rash, eye pain, sinus congestion and sinus pain, sore throat, dysphagia,  hemoptysis , cough,  chest pain, palpitations, orthopnea, edema, abdominal pain, nausea, melena, diarrhea, constipation, flank pain, dysuria, hematuria, urinary  Frequency, nocturia, numbness, tingling, seizures,  Focal weakness, Loss of consciousness,  Tremor, insomnia, depression, anxiety, and suicidal ideation.      Objective:  BP 136/68   Pulse 62   Temp 97.5 F (36.4 C) (Oral)   Resp 15   Ht 5\' 7"  (1.702 m)   Wt (!) 333 lb 3.2 oz (151.1 kg)   SpO2 90%   BMI 52.19 kg/m   BP Readings from Last 3 Encounters:  03/11/17 136/68  03/09/17 (!) 142/55  03/03/17 (!) 154/83    Wt Readings from Last 3 Encounters:  03/11/17 (!) 333 lb 3.2 oz (151.1 kg)  03/09/17 (!) 334 lb 12.8 oz (151.9 kg)  03/03/17 (!) 340 lb (154.2 kg)    General appearance: alert, cooperative and appears stated age.  Not dyspneic at rest  Neck: no adenopathy, no carotid bruit, supple, symmetrical, trachea midline and thyroid not enlarged, symmetric, no tenderness/mass/nodules Back: symmetric, no curvature. ROM normal. No CVA tenderness. Lungs: fair air movement,  wheezes bilaterally , no rales or egophony Heart: regular rate and rhythm, S1, S2 normal, no murmur, click, rub or gallop Abdomen: soft, non-tender; bowel sounds normal; no masses,  no organomegaly Pulses: 2+ and symmetric Skin: Skin color, texture, turgor normal. No rashes or lesions Lymph nodes: Cervical, supraclavicular, and axillary nodes normal.  Lab Results  Component Value Date   HGBA1C 7.6 (H) 03/05/2017   HGBA1C 6.4 06/23/2016   HGBA1C 5.6 08/14/2015    Lab Results  Component Value Date   CREATININE 0.74 03/09/2017   CREATININE 0.83 03/05/2017   CREATININE 0.73 03/04/2017     Lab Results  Component Value Date   WBC 5.7 03/09/2017   HGB 13.0 03/09/2017   HCT 38.6 03/09/2017   PLT 196 03/09/2017   GLUCOSE 344 (H) 03/05/2017   CHOL 159 06/23/2016   TRIG 168.0 (H) 06/23/2016   HDL 53.50 06/23/2016   LDLDIRECT 82.0 06/23/2016   LDLCALC 72 06/23/2016   ALT 17 06/23/2016   AST 16 06/23/2016   NA 135 03/05/2017   K 4.0 03/05/2017   CL 94 (L) 03/05/2017   CREATININE 0.74 03/09/2017   BUN 22 (H) 03/05/2017   CO2 35 (H) 03/05/2017   TSH 1.08 06/08/2014   HGBA1C 7.6 (H) 03/05/2017   MICROALBUR 3.4 (H) 01/30/2015    Dg Chest Port 1 View  Result Date: 03/04/2017 CLINICAL DATA:  Shortness of breath, productive  cough, and wheezing for the past month. History of asthma. Symptoms unrelieved with steroids and rescue inhalers. EXAM: PORTABLE CHEST 1 VIEW COMPARISON:  PA and lateral chest x-ray of March 03, 2017 FINDINGS: The lungs are well-expanded and clear. The cardiac silhouette is enlarged. The pulmonary vascularity is mildly prominent centrally. The mediastinum is normal in width. There is no pleural effusion. IMPRESSION: Mild hyperinflation likely reflects known reactive airway disease. There is mild cardiomegaly which appears new. There is also central pulmonary vascular prominence slightly more conspicuous than in the past that may reflect low-grade CHF. When the patient can tolerate the procedure, a PA and lateral chest x-ray would be useful. Electronically Signed   By: David  Martinique M.D.   On: 03/04/2017 10:22    Assessment & Plan:   Problem List Items Addressed This Visit    Acute respiratory failure with hypoxia ([place])    Secondary to influenza a and asthma exacerbation .She was hospitalized and treated with NIVVM, steroids, inhalers, and supplemental oxygen.  sats are normal today bu she is still wheezing.  prednisone dose increased,  Has pulmonary follow up on Monday       Hospital discharge follow-up    Patient is stable post discharge and has no new  issues or questions about discharge plans at the visit today for hospital follow up. All labs , imaging studies and progress notes from admission were reviewed with patient today        Prediabetes    Now steroid induced with current a1c > 7.0    Glucometer given,  Once daily checks advised. Return in 6 weeks       Sleep apnea - Primary    History of mild,  Now with severe OSA by decenber sleep study  With desaturations.  CPAP titration study ordered today        Other Visit Diagnoses    OSA (obstructive sleep apnea)       Relevant Orders   Ambulatory referral to Sleep Studies      I have discontinued Ms. Tuohy's cholecalciferol, oxyCODONE-acetaminophen, ondansetron, and Spacer/Aero Chamber Mouthpiece. I am also having her start on fluconazole and zolpidem. Additionally, I am having her maintain her atorvastatin, promethazine, potassium chloride SA, omeprazole, losartan, triamterene-hydrochlorothiazide, furosemide, PARoxetine, acyclovir, albuterol, predniSONE, tiotropium, Fluticasone-Salmeterol, metFORMIN, oseltamivir, and chlorpheniramine-HYDROcodone. We will continue to administer pneumococcal 13-valent conjugate vaccine.  Meds ordered this encounter  Medications  . chlorpheniramine-HYDROcodone (TUSSIONEX) 10-8 MG/5ML SUER    Sig: Take 5 mLs by mouth every 12 (twelve) hours as needed for cough.    Dispense:  115 mL    Refill:  0  . fluconazole (DIFLUCAN) 150 MG tablet    Sig: Take 1 tablet (150 mg total) by mouth daily.    Dispense:  2 tablet    Refill:  0  . zolpidem (AMBIEN) 10 MG tablet    Sig: Take 1 tablet (10 mg total) by mouth at bedtime as needed for sleep.    Dispense:  15 tablet    Refill:  1    Medications Discontinued During This Encounter  Medication Reason  . cholecalciferol (VITAMIN D) 1000 UNITS tablet Patient has not taken in last 30 days  . ondansetron (ZOFRAN ODT) 4 MG disintegrating tablet Patient has not taken in last 30 days  .  oxyCODONE-acetaminophen (PERCOCET/ROXICET) 5-325 MG tablet Patient has not taken in last 30 days  . Spacer/Aero Chamber Mouthpiece MISC Patient has not taken in last 30 days  . chlorpheniramine-HYDROcodone (TUSSIONEX) 10-8  MG/5ML SUER Reorder    Follow-up: No Follow-up on file.   [name], MD

## 2017-03-11 NOTE — Progress Notes (Signed)
Pre visit review using our clinic review tool, if applicable. No additional management support is needed unless otherwise documented below in the visit note. 

## 2017-03-11 NOTE — Telephone Encounter (Signed)
Is it ok to schedule pt for 04/06/17 @ 4:00pm for a 3 week follow up?

## 2017-03-12 ENCOUNTER — Telehealth: Payer: Self-pay | Admitting: Internal Medicine

## 2017-03-12 ENCOUNTER — Other Ambulatory Visit: Payer: Self-pay

## 2017-03-12 ENCOUNTER — Other Ambulatory Visit: Payer: Self-pay | Admitting: Internal Medicine

## 2017-03-12 MED ORDER — GLUCOSE BLOOD VI STRP: ORAL_STRIP | 0 refills | Status: DC

## 2017-03-12 NOTE — Telephone Encounter (Signed)
Can you schedule this appt for me please. Pt has not been notified of appt. I will call pt when appt has been scheduled. Thank you very much.

## 2017-03-13 NOTE — Telephone Encounter (Signed)
Pt is aware of appt date and time. 

## 2017-03-13 NOTE — Telephone Encounter (Signed)
Done, let patient know

## 2017-03-14 DIAGNOSIS — Z09 Encounter for follow-up examination after completed treatment for conditions other than malignant neoplasm: Secondary | ICD-10-CM | POA: Insufficient documentation

## 2017-03-14 NOTE — Assessment & Plan Note (Signed)
History of mild,  Now with severe OSA by decenber sleep study  With desaturations.  CPAP titration study ordered today

## 2017-03-14 NOTE — Assessment & Plan Note (Signed)
Secondary to influenza a and asthma exacerbation .She was hospitalized and treated with NIVVM, steroids, inhalers, and supplemental oxygen.  sats are normal today bu she is still wheezing.  prednisone dose increased,  Has pulmonary follow up on Monday

## 2017-03-14 NOTE — Assessment & Plan Note (Signed)
Patient is stable post discharge and has no new issues or questions about discharge plans at the visit today for hospital follow up. All labs , imaging studies and progress notes from admission were reviewed with patient today   

## 2017-03-14 NOTE — Assessment & Plan Note (Signed)
Now steroid induced with current a1c > 7.0    Glucometer given,  Once daily checks advised. Return in 6 weeks

## 2017-03-16 DIAGNOSIS — R05 Cough: Secondary | ICD-10-CM | POA: Diagnosis not present

## 2017-03-16 DIAGNOSIS — G4710 Hypersomnia, unspecified: Secondary | ICD-10-CM | POA: Diagnosis not present

## 2017-03-18 DIAGNOSIS — R0602 Shortness of breath: Secondary | ICD-10-CM | POA: Diagnosis not present

## 2017-03-19 ENCOUNTER — Other Ambulatory Visit: Payer: Self-pay | Admitting: Internal Medicine

## 2017-03-19 DIAGNOSIS — K219 Gastro-esophageal reflux disease without esophagitis: Secondary | ICD-10-CM

## 2017-03-25 ENCOUNTER — Ambulatory Visit
Admission: RE | Admit: 2017-03-25 | Discharge: 2017-03-25 | Disposition: A | Discharge status: Home / Self Care | Payer: Medicare Other | Source: Ambulatory Visit | Attending: Internal Medicine | Admitting: Internal Medicine

## 2017-03-25 ENCOUNTER — Other Ambulatory Visit: Payer: Self-pay | Admitting: Internal Medicine

## 2017-03-25 DIAGNOSIS — K2970 Gastritis, unspecified, without bleeding: Secondary | ICD-10-CM | POA: Diagnosis not present

## 2017-03-25 DIAGNOSIS — K449 Diaphragmatic hernia without obstruction or gangrene: Secondary | ICD-10-CM | POA: Insufficient documentation

## 2017-03-25 DIAGNOSIS — K224 Dyskinesia of esophagus: Secondary | ICD-10-CM | POA: Diagnosis not present

## 2017-03-25 DIAGNOSIS — K219 Gastro-esophageal reflux disease without esophagitis: Secondary | ICD-10-CM

## 2017-03-25 DIAGNOSIS — K4090 Unilateral inguinal hernia, without obstruction or gangrene, not specified as recurrent: Secondary | ICD-10-CM | POA: Diagnosis not present

## 2017-03-25 DIAGNOSIS — G4733 Obstructive sleep apnea (adult) (pediatric): Secondary | ICD-10-CM | POA: Diagnosis not present

## 2017-04-01 ENCOUNTER — Telehealth: Payer: Self-pay | Admitting: *Deleted

## 2017-04-01 NOTE — Telephone Encounter (Signed)
Patient questioned if she would be on metformin , Advair and Spiriva for a period of time . If patient is to continue this medication she preferred to ave the Rx refills sent to [facility] .  Generic- 90 day supply  Pt contact [phone]

## 2017-04-02 MED ORDER — METFORMIN HCL 500 MG PO TABS: 500.0000 mg | ORAL_TABLET | Freq: Two times a day (BID) | ORAL | 1 refills | Status: DC

## 2017-04-02 MED ORDER — TIOTROPIUM BROMIDE MONOHYDRATE 18 MCG IN CAPS: 18.0000 ug | ORAL_CAPSULE | Freq: Every day | RESPIRATORY_TRACT | 2 refills | Status: DC

## 2017-04-02 MED ORDER — FLUTICASONE-SALMETEROL 250-50 MCG/DOSE IN AEPB: 1.0000 | INHALATION_SPRAY | Freq: Two times a day (BID) | RESPIRATORY_TRACT | 2 refills | Status: DC

## 2017-04-02 NOTE — Telephone Encounter (Signed)
Would it be ok to go ahead and send a 90 day supply?

## 2017-04-02 NOTE — Telephone Encounter (Signed)
Yes you can refill the 3 meds for 90 days and send to mail order..  Two choices in her chart

## 2017-04-02 NOTE — Telephone Encounter (Signed)
Medications have been refilled as 90 day supply and sent to [facility]. Pt was notified.

## 2017-04-06 ENCOUNTER — Ambulatory Visit (INDEPENDENT_AMBULATORY_CARE_PROVIDER_SITE_OTHER): Payer: Medicare Other | Admitting: Internal Medicine

## 2017-04-06 VITALS — BP 132/74 | HR 71 | Temp 98.9°F | Resp 17 | Ht 67.0 in | Wt 324.8 lb

## 2017-04-06 DIAGNOSIS — E119 Type 2 diabetes mellitus without complications: Secondary | ICD-10-CM

## 2017-04-06 DIAGNOSIS — E782 Mixed hyperlipidemia: Secondary | ICD-10-CM | POA: Diagnosis not present

## 2017-04-06 DIAGNOSIS — J4550 Severe persistent asthma, uncomplicated: Secondary | ICD-10-CM

## 2017-04-06 DIAGNOSIS — G4733 Obstructive sleep apnea (adult) (pediatric): Secondary | ICD-10-CM

## 2017-04-06 MED ORDER — METFORMIN HCL 1000 MG PO TABS: 1000.0000 mg | ORAL_TABLET | Freq: Two times a day (BID) | ORAL | 2 refills | Status: DC

## 2017-04-06 MED ORDER — ALBUTEROL SULFATE HFA 108 (90 BASE) MCG/ACT IN AERS: 2.0000 | INHALATION_SPRAY | RESPIRATORY_TRACT | 3 refills | Status: DC | PRN

## 2017-04-06 NOTE — Patient Instructions (Signed)
   I have increased your metformin dose to 1000 mg twice daily (up from 500 mg)  If it causes too many GI issues (diarrhea) ,  Reduce your dose to 500 mg twice daily and let me know    Return for fasting labs on July 5 or after.

## 2017-04-06 NOTE — Progress Notes (Signed)
Subjective:  Patient ID: Katie Huber, female    DOB: 04-20-1951  Age: 66 y.o. MRN: [id number]  CC: The primary encounter diagnosis was Controlled type 2 diabetes mellitus without complication, without long-term current use of insulin ([place]). Diagnoses of Mixed hyperlipidemia, Obesity, Class III, BMI 40-49.9 (morbid obesity) ([place]), Obstructive sleep apnea syndrome, and Severe persistent asthma, unspecified whether complicated were also pertinent to this visit.  HPI Katie Huber presents for follow up after a hypoxic respiratory failure  secondary to asthma exacerbation and asymptomatic influenza A   Feeling better,  Using albuterol prn,  Using advair faithfully,  Gets short of breath with activity, not wheezing   ECHO reviewed.   Normal EF    CPAP Titration study was done on April 25th .  Sees Humphrey Rolls on Friday for results   PFT's were done in [place] office  on April 18   EGD : small HH and GERD now on  needs refisll on all mail order med. albuteral MDI refill  done.   cbs still elevated due to recent repeat of steroid tapers.  Taking metformin 500 mg dose incfreasedto 100 mg bid   Lab Results  Component Value Date   HGBA1C 7.6 (H) 03/05/2017         Outpatient Medications Prior to Visit  Medication Sig Dispense Refill  . acyclovir (ZOVIRAX) 400 MG tablet TAKE ONE TABLET BY MOUTH  FIVE TIMES DAILY 90 tablet 1  . atorvastatin (LIPITOR) 20 MG tablet Take 1 tablet (20 mg total) by mouth daily. 90 tablet 3  . Fluticasone-Salmeterol (ADVAIR DISKUS) 250-50 MCG/DOSE AEPB Inhale 1 puff into the lungs 2 (two) times daily. 60 each 2  . furosemide (LASIX) 20 MG tablet TAKE 1 TABLET BY MOUTH  DAILY AS NEEDED FOR FLUID  RETENTION 90 tablet 0  . glucose blood test strip OneTouch Verio Fex.    Test one time daily for steroid induced diabetes 100 each 0  . losartan (COZAAR) 100 MG tablet TAKE 1 TABLET BY MOUTH  DAILY 90 tablet 1  . omeprazole (PRILOSEC) 20 MG capsule TAKE 1 CAPSULE BY  MOUTH TWO TIMES DAILY 180 capsule 1  . PARoxetine (PAXIL) 20 MG tablet TAKE 1 TABLET BY MOUTH  EVERY MORNING 90 tablet 0  . potassium chloride SA (K-DUR,KLOR-CON) 20 MEQ tablet TAKE 1 TABLET BY MOUTH  DAILY WHEN USING FUROSEMIDE 90 tablet 1  . promethazine (PHENERGAN) 25 MG tablet Take 1 tablet (25 mg total) by mouth every 8 (eight) hours as needed for nausea or vomiting. 90 tablet 0  . tiotropium (SPIRIVA) 18 MCG inhalation capsule Place 1 capsule (18 mcg total) into inhaler and inhale daily. 90 capsule 2  . triamterene-hydrochlorothiazide (MAXZIDE-25) 37.5-25 MG tablet TAKE 1 TABLET BY MOUTH  DAILY 90 tablet 1  . zolpidem (AMBIEN) 10 MG tablet Take 1 tablet (10 mg total) by mouth at bedtime as needed for sleep. 15 tablet 1  . albuterol (PROVENTIL HFA;VENTOLIN HFA) 108 (90 Base) MCG/ACT inhaler Inhale 2 puffs into the lungs every 4 (four) hours as needed. 1 Inhaler 0  . metFORMIN (GLUCOPHAGE) 500 MG tablet Take 1 tablet (500 mg total) by mouth 2 (two) times daily with a meal. 180 tablet 1  . chlorpheniramine-HYDROcodone (TUSSIONEX) 10-8 MG/5ML SUER Take 5 mLs by mouth every 12 (twelve) hours as needed for cough. (Patient not taking: Reported on 04/06/2017) 115 mL 0  . fluconazole (DIFLUCAN) 150 MG tablet Take 1 tablet (150 mg total) by mouth daily. (Patient not taking: Reported  on 04/06/2017) 2 tablet 0  . oseltamivir (TAMIFLU) 75 MG capsule Take 1 capsule (75 mg total) by mouth 2 (two) times daily. (Patient not taking: Reported on 04/06/2017) 4 capsule 0  . predniSONE (DELTASONE) 10 MG tablet 40 mg po daily for 2 days, 20 mg po daily for 2 days, 40 mg po daily for 3 days. (Patient not taking: Reported on 04/06/2017) 15 tablet 0   Facility-Administered Medications Prior to Visit  Medication Dose Route Frequency Provider Last Rate Last Dose  . pneumococcal 13-valent conjugate vaccine (PREVNAR 13) injection 0.5 mL  0.5 mL Intramuscular Once [name], MD        Review of Systems;  Patient denies  headache, fevers, malaise, unintentional weight loss, skin rash, eye pain, sinus congestion and sinus pain, sore throat, dysphagia,  hemoptysis , cough, dyspnea, wheezing, chest pain, palpitations, orthopnea, edema, abdominal pain, nausea, melena, diarrhea, constipation, flank pain, dysuria, hematuria, urinary  Frequency, nocturia, numbness, tingling, seizures,  Focal weakness, Loss of consciousness,  Tremor, insomnia, depression, anxiety, and suicidal ideation.      Objective:  BP 132/74 (BP Location: Left Arm, Patient Position: Sitting, Cuff Size: Large)   Pulse 71   Temp 98.9 F (37.2 C) (Oral)   Resp 17   Ht 5\' 7"  (1.702 m)   Wt (!) 324 lb 12.8 oz (147.3 kg)   SpO2 90%   BMI 50.87 kg/m   BP Readings from Last 3 Encounters:  04/06/17 132/74  03/11/17 136/68  03/09/17 (!) 142/55    Wt Readings from Last 3 Encounters:  04/06/17 (!) 324 lb 12.8 oz (147.3 kg)  03/11/17 (!) 333 lb 3.2 oz (151.1 kg)  03/09/17 (!) 334 lb 12.8 oz (151.9 kg)    General appearance: alert, cooperative and appears stated age Ears: normal TM's and external ear canals both ears Throat: lips, mucosa, and tongue normal; teeth and gums normal Neck: no adenopathy, no carotid bruit, supple, symmetrical, trachea midline and thyroid not enlarged, symmetric, no tenderness/mass/nodules Back: symmetric, no curvature. ROM normal. No CVA tenderness. Lungs: clear to auscultation bilaterally Heart: regular rate and rhythm, S1, S2 normal, no murmur, click, rub or gallop Abdomen: soft, non-tender; bowel sounds normal; no masses,  no organomegaly Pulses: 2+ and symmetric Skin: Skin color, texture, turgor normal. No rashes or lesions Lymph nodes: Cervical, supraclavicular, and axillary nodes normal.  Lab Results  Component Value Date   HGBA1C 7.6 (H) 03/05/2017   HGBA1C 6.4 06/23/2016   HGBA1C 5.6 08/14/2015    Lab Results  Component Value Date   CREATININE 0.74 03/09/2017   CREATININE 0.83 03/05/2017    CREATININE 0.73 03/04/2017    Lab Results  Component Value Date   WBC 5.7 03/09/2017   HGB 13.0 03/09/2017   HCT 38.6 03/09/2017   PLT 196 03/09/2017   GLUCOSE 344 (H) 03/05/2017   CHOL 159 06/23/2016   TRIG 168.0 (H) 06/23/2016   HDL 53.50 06/23/2016   LDLDIRECT 82.0 06/23/2016   LDLCALC 72 06/23/2016   ALT 17 06/23/2016   AST 16 06/23/2016   NA 135 03/05/2017   K 4.0 03/05/2017   CL 94 (L) 03/05/2017   CREATININE 0.74 03/09/2017   BUN 22 (H) 03/05/2017   CO2 35 (H) 03/05/2017   TSH 1.08 06/08/2014   HGBA1C 7.6 (H) 03/05/2017   MICROALBUR 3.4 (H) 01/30/2015    Dg Ugi  W/kub  Result Date: 03/25/2017 CLINICAL DATA:  Hiatal hernia with gastroesophageal reflux EXAM: UPPER GI SERIES WITH KUB TECHNIQUE: After  obtaining a scout radiograph a routine upper GI series was performed using thin barium. FLUOROSCOPY TIME:  Fluoroscopy Time:  2 minutes 24 seconds Radiation Exposure Index (if provided by the fluoroscopic device): 202.10 mGy Number of Acquired Spot Images: 18 COMPARISON:  None. FINDINGS: Preliminary abdomen image reveals moderate stool in the colon. The bowel gas pattern is unremarkable. There is postoperative change in the upper abdomen. Esophagus, stomach, and duodenum were visualized. Patient swallows readily. There is mild esophageal dysmotility with occasional intermittent tertiary contractions. There is a small hiatal hernia with reflux of a full column barium to mid esophagus which empties rather slowly. There is no esophageal stricture, mass, or ulceration. Pharynx appears normal. A 13 mm barium tablet passed freely through the esophagus into the stomach without appreciable hesitation. There is fold thickening in the proximal half of the stomach consistent with a degree of gastritis. No mass or ulceration evident. The distal stomach shows no evidence of fold thickening. There is no duodenal mass or ulceration. Duodenal folds appear unremarkable. IMPRESSION: Small hiatal hernia  with moderate reflux. Mild distal esophageal dysmotility. 13 mm barium tablet passed freely through the esophagus into the stomach without appreciable hesitation. No esophageal stricture, mass, or ulceration. Proximal and mid portion gastritis. Remainder of stomach appears normal. Duodenum appears normal. Electronically Signed   By: Lowella Grip III M.D.   On: 03/25/2017 09:45    Assessment & Plan:   Problem List Items Addressed This Visit    Asthma    She has improved,  But has persistent dyspnea and fatigue and recently finished another steroid taper. .  Follow up with pulmonology tomorrow      Relevant Medications   albuterol (PROVENTIL HFA;VENTOLIN HFA) 108 (90 Base) MCG/ACT inhaler   Obesity, Class III, BMI 40-49.9 (morbid obesity) ([place])    I have congratulated her in reduction of   BMI and encouraged  Continued weight loss with goal of 10% of body weigh over the next 6 months using a low glycemic index diet and regular exercise a minimum of 5 days per week.         Relevant Medications   metFORMIN (GLUCOPHAGE) 1000 MG tablet   Sleep apnea    cpap trial done ,  Results sent to  Dr Humphrey Rolls       Other Visit Diagnoses    Controlled type 2 diabetes mellitus without complication, without long-term current use of insulin ([place])    -  Primary   Relevant Medications   metFORMIN (GLUCOPHAGE) 1000 MG tablet   Other Relevant Orders   Comprehensive metabolic panel   Hemoglobin A1c   Lipid panel   Microalbumin / creatinine urine ratio   Mixed hyperlipidemia       Relevant Orders   LDL cholesterol, direct      I have discontinued Ms. Heimann's predniSONE, oseltamivir, chlorpheniramine-HYDROcodone, and fluconazole. I have also changed her metFORMIN. Additionally, I am having her maintain her atorvastatin, promethazine, potassium chloride SA, omeprazole, losartan, triamterene-hydrochlorothiazide, furosemide, PARoxetine, acyclovir, zolpidem, glucose blood, Fluticasone-Salmeterol,  tiotropium, and albuterol. We will continue to administer pneumococcal 13-valent conjugate vaccine.  Meds ordered this encounter  Medications  . albuterol (PROVENTIL HFA;VENTOLIN HFA) 108 (90 Base) MCG/ACT inhaler    Sig: Inhale 2 puffs into the lungs every 4 (four) hours as needed.    Dispense:  3 Inhaler    Refill:  3  . metFORMIN (GLUCOPHAGE) 1000 MG tablet    Sig: Take 1 tablet (1,000 mg total) by mouth 2 (two)  times daily with a meal.    Dispense:  180 tablet    Refill:  2    Medications Discontinued During This Encounter  Medication Reason  . chlorpheniramine-HYDROcodone (TUSSIONEX) 10-8 MG/5ML SUER Therapy completed  . fluconazole (DIFLUCAN) 150 MG tablet Therapy completed  . oseltamivir (TAMIFLU) 75 MG capsule Therapy completed  . predniSONE (DELTASONE) 10 MG tablet Therapy completed  . albuterol (PROVENTIL HFA;VENTOLIN HFA) 108 (90 Base) MCG/ACT inhaler Reorder  . metFORMIN (GLUCOPHAGE) 500 MG tablet Reorder    Follow-up: Return in about 6 months (around 10/07/2017), or labs on or after July 5 .   [name], MD

## 2017-04-06 NOTE — Progress Notes (Signed)
Pre visit review using our clinic review tool, if applicable. No additional management support is needed unless otherwise documented below in the visit note. 

## 2017-04-07 NOTE — Assessment & Plan Note (Signed)
She has improved,  But has persistent dyspnea and fatigue and recently finished another steroid taper. .  Follow up with pulmonology tomorrow

## 2017-04-07 NOTE — Assessment & Plan Note (Signed)
I have congratulated her in reduction of   BMI and encouraged  Continued weight loss with goal of 10% of body weigh over the next 6 months using a low glycemic index diet and regular exercise a minimum of 5 days per week.    

## 2017-04-07 NOTE — Assessment & Plan Note (Signed)
cpap trial done ,  Results sent to  Dr Humphrey Rolls

## 2017-04-07 NOTE — Assessment & Plan Note (Signed)
Now with steroid induced diabetes.  increase metformin to 1000 mg bid .  Lab Results  Component Value Date   HGBA1C 7.6 (H) 03/05/2017

## 2017-04-09 DIAGNOSIS — G4733 Obstructive sleep apnea (adult) (pediatric): Secondary | ICD-10-CM | POA: Diagnosis not present

## 2017-04-09 DIAGNOSIS — R05 Cough: Secondary | ICD-10-CM | POA: Diagnosis not present

## 2017-04-10 ENCOUNTER — Telehealth: Payer: Self-pay

## 2017-04-10 MED ORDER — ALBUTEROL SULFATE HFA 108 (90 BASE) MCG/ACT IN AERS: 2.0000 | INHALATION_SPRAY | Freq: Four times a day (QID) | RESPIRATORY_TRACT | 0 refills | Status: DC | PRN

## 2017-04-10 NOTE — Telephone Encounter (Signed)
Received a fax stating that insurance will not cover Proventil or ventolin inhalers but they will cover the Proair HFA AER 64mcg. Would it be ok to change to the Proair inhaler?

## 2017-04-10 NOTE — Telephone Encounter (Signed)
Yes plesae call in the proair

## 2017-04-10 NOTE — Telephone Encounter (Signed)
proair was sent.

## 2017-04-12 ENCOUNTER — Other Ambulatory Visit: Payer: Self-pay | Admitting: Internal Medicine

## 2017-04-13 NOTE — Telephone Encounter (Signed)
[facility] called and stated that the proventil inhaler requires a PA. The pharmacists wanted to know if Dr. Derrel Nip would be willing to change it to Proair Aersol 90 mcg, that is covered. Please advise, thank you!  Call @1  [phone] Ref # [id number]   PA hotline - 1 [phone]

## 2017-04-14 NOTE — Telephone Encounter (Signed)
Spoke with pharmacist at [facility] and gave him a verbal ok to change the inhaler to proair. Pharmacist stated that he would get it changed.

## 2017-04-29 DIAGNOSIS — G4733 Obstructive sleep apnea (adult) (pediatric): Secondary | ICD-10-CM | POA: Diagnosis not present

## 2017-05-11 DIAGNOSIS — G4733 Obstructive sleep apnea (adult) (pediatric): Secondary | ICD-10-CM | POA: Diagnosis not present

## 2017-05-11 DIAGNOSIS — R0602 Shortness of breath: Secondary | ICD-10-CM | POA: Diagnosis not present

## 2017-05-11 DIAGNOSIS — R05 Cough: Secondary | ICD-10-CM | POA: Diagnosis not present

## 2017-05-13 ENCOUNTER — Encounter: Payer: Self-pay | Admitting: Internal Medicine

## 2017-05-13 ENCOUNTER — Other Ambulatory Visit: Payer: Self-pay | Admitting: Internal Medicine

## 2017-05-13 DIAGNOSIS — R059 Cough, unspecified: Secondary | ICD-10-CM

## 2017-05-13 DIAGNOSIS — R05 Cough: Secondary | ICD-10-CM

## 2017-05-13 MED ORDER — METFORMIN HCL 500 MG PO TABS: 500.0000 mg | ORAL_TABLET | Freq: Two times a day (BID) | ORAL | 0 refills | Status: DC

## 2017-05-18 ENCOUNTER — Ambulatory Visit
Admission: RE | Admit: 2017-05-18 | Discharge: 2017-05-18 | Disposition: A | Discharge status: Home / Self Care | Payer: Medicare Other | Source: Ambulatory Visit | Attending: Internal Medicine | Admitting: Internal Medicine

## 2017-05-18 ENCOUNTER — Other Ambulatory Visit
Admission: RE | Admit: 2017-05-18 | Discharge: 2017-05-18 | Disposition: A | Discharge status: Home / Self Care | Payer: Medicare Other | Source: Ambulatory Visit | Attending: Internal Medicine | Admitting: Internal Medicine

## 2017-05-18 DIAGNOSIS — I700 Atherosclerosis of aorta: Secondary | ICD-10-CM | POA: Diagnosis not present

## 2017-05-18 DIAGNOSIS — I2584 Coronary atherosclerosis due to calcified coronary lesion: Secondary | ICD-10-CM | POA: Insufficient documentation

## 2017-05-18 DIAGNOSIS — R05 Cough: Secondary | ICD-10-CM | POA: Diagnosis not present

## 2017-05-18 DIAGNOSIS — I2721 Secondary pulmonary arterial hypertension: Secondary | ICD-10-CM | POA: Insufficient documentation

## 2017-05-18 DIAGNOSIS — R059 Cough, unspecified: Secondary | ICD-10-CM

## 2017-05-18 DIAGNOSIS — I2510 Atherosclerotic heart disease of native coronary artery without angina pectoris: Secondary | ICD-10-CM | POA: Insufficient documentation

## 2017-05-18 LAB — CREATININE, SERUM
Creatinine, Ser: 0.73 mg/dL (ref 0.44–1.00)
GFR calc non Af Amer: >60 mL/min (ref >60)

## 2017-05-18 MED ORDER — IOPAMIDOL (ISOVUE-370) INJECTION 76%
75.0000 mL | Freq: Once | INTRAVENOUS | Status: AC | PRN
  Administered 2017-05-18: 75 mL via INTRAVENOUS

## 2017-05-30 DIAGNOSIS — G4733 Obstructive sleep apnea (adult) (pediatric): Secondary | ICD-10-CM | POA: Diagnosis not present

## 2017-06-05 ENCOUNTER — Other Ambulatory Visit (INDEPENDENT_AMBULATORY_CARE_PROVIDER_SITE_OTHER): Payer: Medicare Other

## 2017-06-05 DIAGNOSIS — E119 Type 2 diabetes mellitus without complications: Secondary | ICD-10-CM | POA: Diagnosis not present

## 2017-06-05 DIAGNOSIS — E782 Mixed hyperlipidemia: Secondary | ICD-10-CM | POA: Diagnosis not present

## 2017-06-05 LAB — COMPREHENSIVE METABOLIC PANEL
ALT: 14 U/L (ref 0–35)
AST: 15 U/L (ref 0–37)
Albumin: 4.1 g/dL (ref 3.5–5.2)
Alkaline Phosphatase: 56 U/L (ref 39–117)
BUN: 10 mg/dL (ref 6–23)
CALCIUM: 9.8 mg/dL (ref 8.4–10.5)
CHLORIDE: 101 meq/L (ref 96–112)
CO2: 31 meq/L (ref 19–32)
Creatinine, Ser: 0.72 mg/dL (ref 0.40–1.20)
GFR: 86.12 mL/min (ref >60.00)
Glucose, Bld: 155 mg/dL — ABNORMAL HIGH (ref 70–99)
POTASSIUM: 3.7 meq/L (ref 3.5–5.1)
SODIUM: 138 meq/L (ref 135–145)
Total Bilirubin: 0.8 mg/dL (ref 0.2–1.2)
Total Protein: 6.7 g/dL (ref 6.0–8.3)

## 2017-06-05 LAB — LDL CHOLESTEROL, DIRECT: LDL DIRECT: 69 mg/dL

## 2017-06-05 LAB — LIPID PANEL
CHOL/HDL RATIO: 3
Cholesterol: 144 mg/dL (ref 0–200)
HDL: 54.2 mg/dL (ref >39.00)
LDL Cholesterol: 63 mg/dL (ref 0–99)
NONHDL: 89.96
TRIGLYCERIDES: 133 mg/dL (ref 0.0–149.0)
VLDL: 26.6 mg/dL (ref 0.0–40.0)

## 2017-06-05 LAB — HEMOGLOBIN A1C: HEMOGLOBIN A1C: 6.4 % (ref 4.6–6.5)

## 2017-06-05 NOTE — Addendum Note (Signed)
Addended by: Leeanne Rio on: 06/05/2017 09:48 AM   Modules accepted: Orders

## 2017-06-07 ENCOUNTER — Encounter: Payer: Self-pay | Admitting: Internal Medicine

## 2017-06-29 DIAGNOSIS — G4733 Obstructive sleep apnea (adult) (pediatric): Secondary | ICD-10-CM | POA: Diagnosis not present

## 2017-07-02 ENCOUNTER — Telehealth: Payer: Self-pay | Admitting: Internal Medicine

## 2017-07-02 NOTE — Telephone Encounter (Signed)
Called pt and explained to pt that we do not keep samples in the office. The pt stated thank you and that she would just have to go to her storage unit and dig one out that she already has.

## 2017-07-02 NOTE — Telephone Encounter (Signed)
Pt called about having to move and they pushed back her closing date and the medication of tiotropium (SPIRIVA) 18 MCG inhalation capsule which is packed away. Pt wants to know if Dr Derrel Nip has any samples until she closes? Please advise?  Call pt @ [phone]. Thank you!

## 2017-07-07 DIAGNOSIS — J40 Bronchitis, not specified as acute or chronic: Secondary | ICD-10-CM | POA: Diagnosis not present

## 2017-07-07 DIAGNOSIS — R0602 Shortness of breath: Secondary | ICD-10-CM | POA: Diagnosis not present

## 2017-07-07 DIAGNOSIS — G4733 Obstructive sleep apnea (adult) (pediatric): Secondary | ICD-10-CM | POA: Diagnosis not present

## 2017-07-07 DIAGNOSIS — R05 Cough: Secondary | ICD-10-CM | POA: Diagnosis not present

## 2017-07-08 ENCOUNTER — Other Ambulatory Visit: Payer: Self-pay | Admitting: Internal Medicine

## 2017-07-15 DIAGNOSIS — J309 Allergic rhinitis, unspecified: Secondary | ICD-10-CM | POA: Diagnosis not present

## 2017-07-15 DIAGNOSIS — J45991 Cough variant asthma: Secondary | ICD-10-CM | POA: Diagnosis not present

## 2017-07-15 DIAGNOSIS — R030 Elevated blood-pressure reading, without diagnosis of hypertension: Secondary | ICD-10-CM | POA: Diagnosis not present

## 2017-07-24 ENCOUNTER — Telehealth: Payer: Self-pay | Admitting: Internal Medicine

## 2017-07-24 NOTE — Telephone Encounter (Signed)
Pt declined AWV. °

## 2017-07-29 DIAGNOSIS — G4733 Obstructive sleep apnea (adult) (pediatric): Secondary | ICD-10-CM | POA: Diagnosis not present

## 2017-07-30 DIAGNOSIS — G4733 Obstructive sleep apnea (adult) (pediatric): Secondary | ICD-10-CM | POA: Diagnosis not present

## 2017-08-11 DIAGNOSIS — R0602 Shortness of breath: Secondary | ICD-10-CM | POA: Diagnosis not present

## 2017-08-11 DIAGNOSIS — G4733 Obstructive sleep apnea (adult) (pediatric): Secondary | ICD-10-CM | POA: Diagnosis not present

## 2017-08-11 DIAGNOSIS — R05 Cough: Secondary | ICD-10-CM | POA: Diagnosis not present

## 2017-08-26 ENCOUNTER — Other Ambulatory Visit: Payer: Self-pay | Admitting: Internal Medicine

## 2017-08-26 NOTE — Telephone Encounter (Signed)
Orders

## 2017-08-30 DIAGNOSIS — G4733 Obstructive sleep apnea (adult) (pediatric): Secondary | ICD-10-CM | POA: Diagnosis not present

## 2017-09-02 DIAGNOSIS — G4733 Obstructive sleep apnea (adult) (pediatric): Secondary | ICD-10-CM | POA: Diagnosis not present

## 2017-09-09 ENCOUNTER — Ambulatory Visit (INDEPENDENT_AMBULATORY_CARE_PROVIDER_SITE_OTHER): Payer: Medicare Other | Admitting: Internal Medicine

## 2017-09-09 ENCOUNTER — Ambulatory Visit (INDEPENDENT_AMBULATORY_CARE_PROVIDER_SITE_OTHER): Payer: Medicare Other

## 2017-09-09 ENCOUNTER — Encounter: Payer: Self-pay | Admitting: Internal Medicine

## 2017-09-09 VITALS — BP 146/70 | HR 79 | Temp 97.8°F | Resp 16 | Ht 67.0 in | Wt 321.0 lb

## 2017-09-09 VITALS — BP 146/70 | HR 79 | Temp 97.8°F | Resp 16 | Ht 67.0 in | Wt 321.4 lb

## 2017-09-09 DIAGNOSIS — Z23 Encounter for immunization: Secondary | ICD-10-CM | POA: Diagnosis not present

## 2017-09-09 DIAGNOSIS — E119 Type 2 diabetes mellitus without complications: Secondary | ICD-10-CM | POA: Diagnosis not present

## 2017-09-09 DIAGNOSIS — Z0000 Encounter for general adult medical examination without abnormal findings: Secondary | ICD-10-CM | POA: Diagnosis not present

## 2017-09-09 DIAGNOSIS — I10 Essential (primary) hypertension: Secondary | ICD-10-CM | POA: Diagnosis not present

## 2017-09-09 DIAGNOSIS — E66813 Obesity, class 3: Secondary | ICD-10-CM

## 2017-09-09 DIAGNOSIS — M542 Cervicalgia: Secondary | ICD-10-CM

## 2017-09-09 LAB — COMPREHENSIVE METABOLIC PANEL
ALT: 29 U/L (ref 0–35)
AST: 15 U/L (ref 0–37)
Albumin: 4.4 g/dL (ref 3.5–5.2)
Alkaline Phosphatase: 91 U/L (ref 39–117)
BUN: 13 mg/dL (ref 6–23)
CALCIUM: 10.1 mg/dL (ref 8.4–10.5)
CHLORIDE: 100 meq/L (ref 96–112)
CO2: 31 meq/L (ref 19–32)
CREATININE: 0.61 mg/dL (ref 0.40–1.20)
GFR: 104.2 mL/min (ref >60.00)
Glucose, Bld: 146 mg/dL — ABNORMAL HIGH (ref 70–99)
Potassium: 3.5 mEq/L (ref 3.5–5.1)
Sodium: 137 mEq/L (ref 135–145)
Total Bilirubin: 0.5 mg/dL (ref 0.2–1.2)
Total Protein: 7.1 g/dL (ref 6.0–8.3)

## 2017-09-09 LAB — MICROALBUMIN / CREATININE URINE RATIO
Creatinine,U: 35.4 mg/dL
MICROALB UR: 0.7 mg/dL (ref 0.0–1.9)
Microalb Creat Ratio: 2 mg/g (ref 0.0–30.0)

## 2017-09-09 LAB — POCT GLYCOSYLATED HEMOGLOBIN (HGB A1C): Hemoglobin A1C: 6.1

## 2017-09-09 MED ORDER — GABAPENTIN 100 MG PO CAPS
100.0000 mg | ORAL_CAPSULE | Freq: Three times a day (TID) | ORAL | 3 refills | Status: DC
Start: 2017-09-09 — End: 2018-01-25

## 2017-09-09 MED ORDER — LIRAGLUTIDE 18 MG/3ML ~~LOC~~ SOPN: PEN_INJECTOR | SUBCUTANEOUS | 2 refills | Status: DC

## 2017-09-09 MED ORDER — LIRAGLUTIDE 18 MG/3ML ~~LOC~~ SOPN
PEN_INJECTOR | SUBCUTANEOUS | 2 refills | Status: DC
Start: 2017-09-09 — End: 2017-09-09

## 2017-09-09 MED ORDER — MONTELUKAST SODIUM 10 MG PO TABS: 10.0000 mg | ORAL_TABLET | Freq: Every day | ORAL | 2 refills | Status: DC

## 2017-09-09 MED ORDER — TRAMADOL HCL 50 MG PO TABS: 50.0000 mg | ORAL_TABLET | Freq: Four times a day (QID) | ORAL | 0 refills | Status: DC | PRN

## 2017-09-09 MED ORDER — PREDNISONE 10 MG PO TABS: ORAL_TABLET | ORAL | 0 refills | Status: DC

## 2017-09-09 NOTE — Progress Notes (Signed)
Subjective:  Patient ID: Katie Huber, female    DOB: 02-Jan-1951  Age: 66 y.o. MRN: [id number]  CC: The primary encounter diagnosis was Obesity, Class III, BMI 40-49.9 (morbid obesity) ([place]). Diagnoses of Encounter for immunization, Cervicalgia, Need for pneumococcal vaccination, Diabetes mellitus without complication ([place]), and Essential hypertension, benign were also pertinent to this visit.  HPI Katie Huber presents for 5 month follow up on diabetes.  Patient has no complaints today.  Patient is following a low glycemic index diet and taking all prescribed medications regularly without side effects.  Fasting sugars have been under less than 140 most of the time and post prandials have been under 160 except on rare occasions. Patient is exercising about 3 times per week and intentionally trying to lose weight .  Patient has had an eye exam in the last 12 months and checks feet regularly for signs of infection.  Patient does not walk barefoot outside,  And denies an numbness tingling or burning in feet. Patient is up to date on all recommended vactoo painful to adbcinations  Overdue for mammogram, eye exam, foot exam.   Flu vaccine and pneumonia vaccine   Sleeping better using CPAP Breathing better  On singulair  Metformin dose increase not tolerated due to persistent diarrhea   Still taking 1000 mg daily    Cc:  Neck pain with numbness of right arm  Too painful to abduct.   Radiates to hand  Last MIRi 2012   Taking tylenol up to 4 grams daily   foot exam done   Eye exam by Dr Matilde Sprang Feb 2018  Lab Results  Component Value Date   HGBA1C 6.1 09/09/2017   Lab Results  Component Value Date   MICROALBUR 0.7 09/09/2017   Lab Results  Component Value Date   CHOL 144 06/05/2017   HDL 54.20 06/05/2017   LDLCALC 63 06/05/2017   LDLDIRECT 69.0 06/05/2017   TRIG 133.0 06/05/2017   CHOLHDL 3 06/05/2017     Outpatient Medications Prior to Visit  Medication Sig Dispense Refill  .  acyclovir (ZOVIRAX) 400 MG tablet TAKE ONE TABLET BY MOUTH  FIVE TIMES DAILY 180 tablet 1  . albuterol (PROVENTIL HFA;VENTOLIN HFA) 108 (90 Base) MCG/ACT inhaler Inhale 2 puffs into the lungs every 6 (six) hours as needed for wheezing or shortness of breath. 1 Inhaler 0  . atorvastatin (LIPITOR) 20 MG tablet TAKE 1 TABLET BY MOUTH  DAILY 90 tablet 1  . Fluticasone-Salmeterol (ADVAIR DISKUS) 250-50 MCG/DOSE AEPB Inhale 1 puff into the lungs 2 (two) times daily. 60 each 2  . furosemide (LASIX) 20 MG tablet TAKE 1 TABLET BY MOUTH  DAILY AS NEEDED FOR FLUID  RETENTION 90 tablet 1  . glucose blood test strip OneTouch Verio Fex.    Test one time daily for steroid induced diabetes 100 each 0  . losartan (COZAAR) 100 MG tablet TAKE 1 TABLET BY MOUTH  DAILY 90 tablet 1  . metFORMIN (GLUCOPHAGE) 500 MG tablet Take 1 tablet (500 mg total) by mouth 2 (two) times daily with a meal. 180 tablet 0  . omeprazole (PRILOSEC) 20 MG capsule TAKE 1 CAPSULE BY MOUTH TWO TIMES DAILY 180 capsule 1  . PARoxetine (PAXIL) 20 MG tablet TAKE 1 TABLET BY MOUTH  EVERY MORNING 90 tablet 1  . potassium chloride SA (K-DUR,KLOR-CON) 20 MEQ tablet TAKE 1 TABLET BY MOUTH  DAILY WHEN USING FUROSEMIDE 90 tablet 1  . promethazine (PHENERGAN) 25 MG tablet Take 1 tablet (25 mg  total) by mouth every 8 (eight) hours as needed for nausea or vomiting. 90 tablet 0  . tiotropium (SPIRIVA) 18 MCG inhalation capsule Place 1 capsule (18 mcg total) into inhaler and inhale daily. 90 capsule 2  . triamterene-hydrochlorothiazide (MAXZIDE-25) 37.5-25 MG tablet TAKE 1 TABLET BY MOUTH  DAILY 90 tablet 1  . zolpidem (AMBIEN) 10 MG tablet Take 1 tablet (10 mg total) by mouth at bedtime as needed for sleep. 15 tablet 1   Facility-Administered Medications Prior to Visit  Medication Dose Route Frequency Provider Last Rate Last Dose  . pneumococcal 13-valent conjugate vaccine (PREVNAR 13) injection 0.5 mL  0.5 mL Intramuscular Once [name], MD         Review of Systems;  Patient denies headache, fevers, malaise, unintentional weight loss, skin rash, eye pain, sinus congestion and sinus pain, sore throat, dysphagia,  hemoptysis , cough, dyspnea, wheezing, chest pain, palpitations, orthopnea, edema, abdominal pain, nausea, melena, diarrhea, constipation, flank pain, dysuria, hematuria, urinary  Frequency, nocturia, numbness, tingling, seizures,  Focal weakness, Loss of consciousness,  Tremor, insomnia, depression, anxiety, and suicidal ideation.      Objective:  BP (!) 146/70 (BP Location: Left Arm, Patient Position: Sitting, Cuff Size: Large)   Pulse 79   Temp 97.8 F (36.6 C) (Oral)   Resp 16   Ht 5\' 7"  (1.702 m)   Wt (!) 321 lb 6.4 oz (145.8 kg)   SpO2 95%   BMI 50.34 kg/m   BP Readings from Last 3 Encounters:  09/09/17 (!) 146/70  09/09/17 (!) 146/70  04/06/17 132/74    Wt Readings from Last 3 Encounters:  09/09/17 (!) 321 lb (145.6 kg)  09/09/17 (!) 321 lb 6.4 oz (145.8 kg)  04/06/17 (!) 324 lb 12.8 oz (147.3 kg)    General appearance: alert, cooperative and appears stated age Ears: normal TM's and external ear canals both ears Throat: lips, mucosa, and tongue normal; teeth and gums normal Neck: no adenopathy, no carotid bruit, supple, symmetrical, trachea midline and thyroid not enlarged, symmetric, no tenderness/mass/nodules Back: symmetric, no curvature. ROM normal. No CVA tenderness. Lungs: clear to auscultation bilaterally Heart: regular rate and rhythm, S1, S2 normal, no murmur, click, rub or gallop Abdomen: soft, non-tender; bowel sounds normal; no masses,  no organomegaly Pulses: 2+ and symmetric Skin: Skin color, texture, turgor normal. No rashes or lesions Lymph nodes: Cervical, supraclavicular, and axillary nodes normal.  Lab Results  Component Value Date   HGBA1C 6.1 09/09/2017   HGBA1C 6.4 06/05/2017   HGBA1C 7.6 (H) 03/05/2017    Lab Results  Component Value Date   CREATININE 0.61  09/09/2017   CREATININE 0.72 06/05/2017   CREATININE 0.73 05/18/2017    Lab Results  Component Value Date   WBC 5.7 03/09/2017   HGB 13.0 03/09/2017   HCT 38.6 03/09/2017   PLT 196 03/09/2017   GLUCOSE 146 (H) 09/09/2017   CHOL 144 06/05/2017   TRIG 133.0 06/05/2017   HDL 54.20 06/05/2017   LDLDIRECT 69.0 06/05/2017   LDLCALC 63 06/05/2017   ALT 29 09/09/2017   AST 15 09/09/2017   NA 137 09/09/2017   K 3.5 09/09/2017   CL 100 09/09/2017   CREATININE 0.61 09/09/2017   BUN 13 09/09/2017   CO2 31 09/09/2017   TSH 1.08 06/08/2014   HGBA1C 6.1 09/09/2017   MICROALBUR 0.7 09/09/2017    Ct Chest W Contrast  Result Date: 05/18/2017 CLINICAL DATA:  66 year old female with persistent cough since March of 2018 EXAM:  CT CHEST WITH CONTRAST TECHNIQUE: Multidetector CT imaging of the chest was performed during intravenous contrast administration. CONTRAST:  75 mL Isovue 370 COMPARISON:  Prior CT scan of the chest 09/25/2009 FINDINGS: Cardiovascular: Conventional 3 vessel arch anatomy. No evidence of aneurysm or dissection. The main pulmonary artery is enlarged at 3.5 cm. No evidence of central pulmonary embolus. Calcifications are present along the branches of the coronary arteries consistent with coronary artery disease. The heart is borderline enlarged in size. No pericardial effusion. Mild atherosclerotic calcifications along the aorta. Mediastinum/Nodes: Unremarkable CT appearance of the thyroid gland. No suspicious mediastinal or hilar adenopathy. No soft tissue mediastinal mass. The thoracic esophagus is unremarkable. Lungs/Pleura: The lungs are clear save for minimal dependent atelectasis. No pleural effusion. Upper Abdomen: Surgical changes of prior gastric partition and cholecystectomy. No acute abnormality visualized in the upper abdomen. Musculoskeletal: No acute fracture or aggressive appearing lytic or blastic osseous lesion. IMPRESSION: 1. No acute abnormality within the chest. 2.  Enlarged main pulmonary artery at 3.5 cm. Similar findings can be seen in the setting of pulmonary arterial hypertension. 3.  Aortic Atherosclerosis (ICD10-170.0) 4. Calcifications along the coronary arteries. Please note that although the presence of coronary artery calcium documents the presence of coronary artery disease, the severity of this disease and any potential stenosis cannot be assessed on this non-gated CT examination. Assessment for potential risk factor modification, dietary therapy or pharmacologic therapy may be warranted, if clinically  indicated. Electronically Signed   By: Jacqulynn Cadet M.D.   On: 05/18/2017 12:57    Assessment & Plan:   Problem List Items Addressed This Visit    Cervicalgia    With radiculopathy and persistent pain. MRI cervical spine ordered.       Relevant Orders   MR CERVICAL SPINE WO CONTRAST   Diabetes mellitus without complication ([place])    Currently well-controlled on current medications .  hemoglobin A1c is at goal of less than 7.0 . Patient is reminded to schedule an annual eye exam and foot exam is normal today. Patient has no microalbuminuria. Patient is tolerating statin therapy for CAD risk reduction and on ACE/ARB for renal protection and hypertension   Lab Results  Component Value Date   HGBA1C 6.1 09/09/2017   Lab Results  Component Value Date   MICROALBUR 0.7 09/09/2017         Relevant Medications   liraglutide 18 MG/3ML SOPN   Essential hypertension, benign    Well controlled on current regimen. Renal function stable, no changes today.  Lab Results  Component Value Date   CREATININE 0.61 09/09/2017   Lab Results  Component Value Date   NA 137 09/09/2017   K 3.5 09/09/2017   CL 100 09/09/2017   CO2 31 09/09/2017         Obesity, Class III, BMI 40-49.9 (morbid obesity) ([place]) - Primary    I have addressed  BMI and recommended wt loss of 10% of body weigh over the next 6 months using a low glycemic index diet and  regular exercise a minimum of 5 days per week.        Relevant Medications   liraglutide 18 MG/3ML SOPN   Other Relevant Orders   POCT HgB A1C (Completed)   Microalbumin / creatinine urine ratio (Completed)   Comprehensive metabolic panel (Completed)    Other Visit Diagnoses    Encounter for immunization       Relevant Orders   Flu vaccine HIGH DOSE PF (Completed)  Need for pneumococcal vaccination          I have changed Ms. Grenz's montelukast. I am also having her start on gabapentin, predniSONE, and traMADol. Additionally, I am having her maintain her promethazine, zolpidem, glucose blood, Fluticasone-Salmeterol, tiotropium, albuterol, PARoxetine, atorvastatin, omeprazole, triamterene-hydrochlorothiazide, losartan, furosemide, metFORMIN, acyclovir, potassium chloride SA, and liraglutide. We will continue to administer pneumococcal 13-valent conjugate vaccine.  Meds ordered this encounter  Medications  . DISCONTD: montelukast (SINGULAIR) 10 MG tablet  . DISCONTD: liraglutide 18 MG/3ML SOPN    Sig: 0.6 mg  SubQ  once daily for 1 week;  increase to 1.2 mg once daily;  may increase  to 1.8 mg once daily    Dispense:  9 mL    Refill:  2  . liraglutide 18 MG/3ML SOPN    Sig: 0.6 mg  SubQ  once daily for 1 week;  increase to 1.2 mg once daily;  may increase  to 1.8 mg once daily    Dispense:  9 mL    Refill:  2  . gabapentin (NEURONTIN) 100 MG capsule    Sig: Take 1 capsule (100 mg total) by mouth 3 (three) times daily.    Dispense:  90 capsule    Refill:  3  . predniSONE (DELTASONE) 10 MG tablet    Sig: 6 tablets on Day 1 , then reduce by 1 tablet daily until gone    Dispense:  21 tablet    Refill:  0  . traMADol (ULTRAM) 50 MG tablet    Sig: Take 1 tablet (50 mg total) by mouth every 6 (six) hours as needed.    Dispense:  120 tablet    Refill:  0  . montelukast (SINGULAIR) 10 MG tablet    Sig: Take 1 tablet (10 mg total) by mouth at bedtime.    Dispense:  90 tablet     Refill:  2    Medications Discontinued During This Encounter  Medication Reason  . liraglutide 18 MG/3ML SOPN   . montelukast (SINGULAIR) 10 MG tablet Reorder    Follow-up: Return in about 3 months (around 12/10/2017) for follow up diabetes.   [name], MD
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
## 2017-09-09 NOTE — Patient Instructions (Signed)
Prednisone taper for 6 days  Gabapentin start at 100 mg three times daily  Increase gradually if needed up to 300 mg three times daily  Tramadol up to 4 times daily  No more than 2000 mg tylenol daily  MRI ordered   [facility]oza PA in process

## 2017-09-09 NOTE — Patient Instructions (Addendum)
  Ms. Choy , Thank you for taking time to come for your Medicare Wellness Visit. I appreciate your ongoing commitment to your health goals. Please review the following plan we discussed and let me know if I can assist you in the future.   Follow up with Dr. Derrel Nip as needed.    Bring a copy of your [place] and/or Living Will to be scanned into chart.  Have a great day!  These are the goals we discussed: Goals    . Healthy Lifestyle          Swim for exercise Low carb foods Stay hydrated       This is a list of the screening recommended for you and due dates:  Health Maintenance  Topic Date Due  . Eye exam for diabetics  03/15/2015  . Pneumonia vaccines (2 of 2 - PPSV23) 05/16/2016  . Complete foot exam   08/13/2016  . Flu Shot  02/03/2018*  . Colon Cancer Screening  04/06/2018*  . Hemoglobin A1C  12/06/2017  . Mammogram  06/11/2018  . Tetanus Vaccine  09/21/2023  . DEXA scan (bone density measurement)  Completed  .  Hepatitis C: One time screening is recommended by Center for Disease Control  (CDC) for  adults born from [birth year] through 1965.   Completed  *Topic was postponed. The date shown is not the original due date.

## 2017-09-10 ENCOUNTER — Encounter: Payer: Self-pay | Admitting: Internal Medicine

## 2017-09-10 NOTE — Assessment & Plan Note (Signed)
Currently well-controlled on current medications .  hemoglobin A1c is at goal of less than 7.0 . Patient is reminded to schedule an annual eye exam and foot exam is normal today. Patient has no microalbuminuria. Patient is tolerating statin therapy for CAD risk reduction and on ACE/ARB for renal protection and hypertension   Lab Results  Component Value Date   HGBA1C 6.1 09/09/2017   Lab Results  Component Value Date   MICROALBUR 0.7 09/09/2017

## 2017-09-10 NOTE — Progress Notes (Signed)
Katie Huber (Key: [id number])  Victoza 18MG Fayne [facility] [place] SOP[facility]   Pa Started on cover my meds.

## 2017-09-10 NOTE — Assessment & Plan Note (Signed)
With radiculopathy and persistent pain. MRI cervical spine ordered.

## 2017-09-10 NOTE — Assessment & Plan Note (Signed)
Well controlled on current regimen. Renal function stable, no changes today.  Lab Results  Component Value Date   CREATININE 0.61 09/09/2017   Lab Results  Component Value Date   NA 137 09/09/2017   K 3.5 09/09/2017   CL 100 09/09/2017   CO2 31 09/09/2017

## 2017-09-10 NOTE — Assessment & Plan Note (Signed)
I have addressed  BMI and recommended wt loss of 10% of body weigh over the next 6 months using a low glycemic index diet and regular exercise a minimum of 5 days per week.   

## 2017-09-16 ENCOUNTER — Telehealth: Payer: Self-pay

## 2017-09-16 NOTE — Telephone Encounter (Signed)
[facility] called an stated that the Victoza does not require prior authorization but is going to cost the pt $899.52 every 3 months. Called the pt to let her know this and she stated that she could not afford that. Is there anything else that you would recommend for the pt?

## 2017-09-16 NOTE — Telephone Encounter (Signed)
Trulicity is similar,  But injected once a week.  If she wants to check to see if it is covered before I send it in,  Let me now

## 2017-09-17 MED ORDER — SITAGLIPTIN PHOSPHATE 50 MG PO TABS: 50.0000 mg | ORAL_TABLET | Freq: Every day | ORAL | 5 refills | Status: DC

## 2017-09-17 NOTE — Telephone Encounter (Signed)
Spoke with pt and she stated that she has already checked into the Trulicity and it is way to expensive as well. She stated that she spoke with her insurance and they told her that she would not be able to get any help with her medications because she has supplemental insurance. She stated that there is coupons out there but they only work for the uninsured.

## 2017-09-17 NOTE — Telephone Encounter (Signed)
Sending generic Celesta Gentile,.   Once daily .  Oral

## 2017-09-17 NOTE — Addendum Note (Signed)
Addended by: [name] on: 09/17/2017 02:18 PM   Modules accepted: Orders

## 2017-09-29 ENCOUNTER — Ambulatory Visit
Admission: RE | Admit: 2017-09-29 | Discharge: 2017-09-29 | Disposition: A | Discharge status: Home / Self Care | Payer: Medicare Other | Source: Ambulatory Visit | Attending: Internal Medicine | Admitting: Internal Medicine

## 2017-09-29 DIAGNOSIS — M5030 Other cervical disc degeneration, unspecified cervical region: Secondary | ICD-10-CM | POA: Insufficient documentation

## 2017-09-29 DIAGNOSIS — M542 Cervicalgia: Secondary | ICD-10-CM | POA: Diagnosis present

## 2017-09-29 DIAGNOSIS — M47813 Spondylosis without myelopathy or radiculopathy, cervicothoracic region: Secondary | ICD-10-CM | POA: Diagnosis not present

## 2017-09-29 DIAGNOSIS — G4733 Obstructive sleep apnea (adult) (pediatric): Secondary | ICD-10-CM | POA: Diagnosis not present

## 2017-09-29 DIAGNOSIS — M5033 Other cervical disc degeneration, cervicothoracic region: Secondary | ICD-10-CM | POA: Insufficient documentation

## 2017-10-01 ENCOUNTER — Encounter: Payer: Self-pay | Admitting: Internal Medicine

## 2017-10-05 ENCOUNTER — Telehealth: Payer: Self-pay

## 2017-10-05 DIAGNOSIS — M542 Cervicalgia: Secondary | ICD-10-CM

## 2017-10-05 DIAGNOSIS — R202 Paresthesia of skin: Principal | ICD-10-CM

## 2017-10-05 DIAGNOSIS — R200 Anesthesia of skin: Secondary | ICD-10-CM

## 2017-10-05 LAB — HM DIABETES EYE EXAM

## 2017-10-05 NOTE — Telephone Encounter (Signed)
Please advise 

## 2017-10-05 NOTE — Telephone Encounter (Signed)
Copied from [place] [phone]. Topic: Referral - Request >> Oct 05, 2017 10:20 AM Ether Griffins B wrote: Reason for CRM: needing a referral for [facility] Neurologist.

## 2017-10-06 MED ORDER — TRAMADOL HCL 50 MG PO TABS: 50.0000 mg | ORAL_TABLET | Freq: Four times a day (QID) | ORAL | 3 refills | Status: DC | PRN

## 2017-10-06 NOTE — Telephone Encounter (Signed)
What is Katie reason. You need to Start asking

## 2017-10-06 NOTE — Addendum Note (Signed)
Addended by: [name] on: 10/06/2017 04:37 PM   Modules accepted: Orders

## 2017-10-06 NOTE — Telephone Encounter (Signed)
Per her MRI results and message from you in my chart you said if she needed referral to let you know, that she should follow up with neurology.For pain in neck and numbness to right arm. Patient stated she is almost out of tramadol and is requesting refill last fill 09/09/17 120 tablets with no refill.

## 2017-10-06 NOTE — Telephone Encounter (Addendum)
Thank you very much for the additional information , Juliann Pulse! Referral and refill done

## 2017-10-07 ENCOUNTER — Ambulatory Visit: Payer: Medicare Other | Admitting: Internal Medicine

## 2017-10-07 NOTE — Telephone Encounter (Signed)
rx has been printed, signed and faxed.  

## 2017-10-13 ENCOUNTER — Telehealth: Payer: Self-pay | Admitting: *Deleted

## 2017-10-13 ENCOUNTER — Telehealth: Payer: Self-pay

## 2017-10-13 ENCOUNTER — Other Ambulatory Visit: Payer: Self-pay | Admitting: Internal Medicine

## 2017-10-13 NOTE — Telephone Encounter (Signed)
Pt was called to let her know that her referral was sent to [facility] at her request. They will contact her to schedule this appointment. Advised her if she hasn't heard from them by next week to please give me a call to follow up with them.

## 2017-10-13 NOTE — Telephone Encounter (Signed)
Copied from [place] [phone]. Topic: General - Other >> Oct 13, 2017 10:01 AM Darl Householder, RMA wrote: Reason for CRM: patient is requesting refill on Tramadol 50 mg to be sent to [facility] [address] [address]

## 2017-10-13 NOTE — Telephone Encounter (Signed)
Looks like referral has been placed and approved. Not sure why she is calling?

## 2017-10-13 NOTE — Telephone Encounter (Signed)
Copied from [place] [phone]. Topic: Referral - Request >> Oct 05, 2017 10:20 AM Ether Griffins B wrote: Reason for CRM: needing a referral for [facility].   >> Oct 13, 2017  9:59 AM Darl Householder, RMA wrote: Patient is calling for a second time concerning neurology referral please return pt call

## 2017-10-13 NOTE — Telephone Encounter (Signed)
Copied from [place] [phone]. Topic: Referral - Request >> Oct 05, 2017 10:20 AM Ether Griffins B wrote: Reason for CRM: needing a referral for [facility].   >> Oct 13, 2017  9:59 AM Darl Householder, RMA wrote: Patient is calling for a second time concerning neurology referral please return pt call

## 2017-10-13 NOTE — Telephone Encounter (Signed)
Called [facility] to [facility] script. Will need to be [facility]ed to [facility]. Do you have script?

## 2017-10-13 NOTE — Telephone Encounter (Deleted)
Copied from [place] [phone]. Topic: Referral - Request >> Oct 05, 2017 10:20 AM Ether Griffins B wrote: Reason for CRM: needing a referral for [facility].   >> Oct 13, 2017  9:59 AM Darl Householder, RMA wrote: Patient is calling for a second time concerning neurology referral please return pt call

## 2017-10-13 NOTE — Telephone Encounter (Signed)
Rx has been faxed to [facility] [address] [address].

## 2017-10-19 NOTE — Telephone Encounter (Signed)
Please advise 

## 2017-10-19 NOTE — Telephone Encounter (Signed)
lvm for np coordinators at [facility]. They will call me back or call pt.

## 2017-10-19 NOTE — Telephone Encounter (Signed)
Patient said she was told by the opffice if she hadn't heard from the [place] that she was to call the office back. Could someone please give her a call. [phone]

## 2017-10-23 ENCOUNTER — Encounter: Payer: Self-pay | Admitting: Internal Medicine

## 2017-10-23 DIAGNOSIS — R202 Paresthesia of skin: Secondary | ICD-10-CM

## 2017-10-23 DIAGNOSIS — R200 Anesthesia of skin: Secondary | ICD-10-CM

## 2017-10-23 DIAGNOSIS — M542 Cervicalgia: Secondary | ICD-10-CM

## 2017-10-28 ENCOUNTER — Other Ambulatory Visit: Payer: Self-pay | Admitting: Internal Medicine

## 2017-10-29 DIAGNOSIS — E538 Deficiency of other specified B group vitamins: Secondary | ICD-10-CM | POA: Diagnosis not present

## 2017-10-29 DIAGNOSIS — M79601 Pain in right arm: Secondary | ICD-10-CM | POA: Diagnosis not present

## 2017-10-29 DIAGNOSIS — R200 Anesthesia of skin: Secondary | ICD-10-CM | POA: Diagnosis not present

## 2017-10-29 DIAGNOSIS — M542 Cervicalgia: Secondary | ICD-10-CM | POA: Diagnosis not present

## 2017-10-29 DIAGNOSIS — E559 Vitamin D deficiency, unspecified: Secondary | ICD-10-CM | POA: Diagnosis not present

## 2017-10-29 DIAGNOSIS — Z79899 Other long term (current) drug therapy: Secondary | ICD-10-CM | POA: Diagnosis not present

## 2017-10-30 DIAGNOSIS — G4733 Obstructive sleep apnea (adult) (pediatric): Secondary | ICD-10-CM | POA: Diagnosis not present

## 2017-11-29 DIAGNOSIS — G4733 Obstructive sleep apnea (adult) (pediatric): Secondary | ICD-10-CM | POA: Diagnosis not present

## 2017-12-03 ENCOUNTER — Ambulatory Visit: Payer: Self-pay | Admitting: Internal Medicine

## 2017-12-04 ENCOUNTER — Encounter: Payer: Self-pay | Admitting: Internal Medicine

## 2017-12-04 ENCOUNTER — Ambulatory Visit: Payer: Medicare Other | Admitting: Internal Medicine

## 2017-12-04 ENCOUNTER — Other Ambulatory Visit: Payer: Self-pay | Admitting: Internal Medicine

## 2017-12-04 VITALS — BP 140/82 | HR 61 | Temp 97.4°F | Ht 67.0 in | Wt 318.8 lb

## 2017-12-04 DIAGNOSIS — R058 Other specified cough: Secondary | ICD-10-CM

## 2017-12-04 DIAGNOSIS — R05 Cough: Secondary | ICD-10-CM | POA: Diagnosis not present

## 2017-12-04 DIAGNOSIS — J440 Chronic obstructive pulmonary disease with acute lower respiratory infection: Secondary | ICD-10-CM

## 2017-12-04 DIAGNOSIS — J209 Acute bronchitis, unspecified: Secondary | ICD-10-CM

## 2017-12-04 DIAGNOSIS — I10 Essential (primary) hypertension: Secondary | ICD-10-CM | POA: Diagnosis not present

## 2017-12-04 DIAGNOSIS — N3001 Acute cystitis with hematuria: Secondary | ICD-10-CM

## 2017-12-04 MED ORDER — PREDNISONE 10 MG PO TABS: ORAL_TABLET | ORAL | 0 refills | Status: DC

## 2017-12-04 MED ORDER — HYDROCOD POLST-CPM POLST ER 10-8 MG/5ML PO SUER: 5.0000 mL | Freq: Two times a day (BID) | ORAL | 0 refills | Status: DC

## 2017-12-04 MED ORDER — SULFAMETHOXAZOLE-TRIMETHOPRIM 400-80 MG PO TABS: 1.0000 | ORAL_TABLET | Freq: Two times a day (BID) | ORAL | 0 refills | Status: AC

## 2017-12-04 MED ORDER — SULFAMETHOXAZOLE-TRIMETHOPRIM 400-80 MG PO TABS: 1.0000 | ORAL_TABLET | Freq: Two times a day (BID) | ORAL | 0 refills | Status: DC

## 2017-12-04 NOTE — Progress Notes (Signed)
[facility] [place], [place] [address]  Internal MEDICINE  Office Visit Note  Patient Name: Katie Huber  [id number]  [id number]  Date of Service: 12/04/2017    Complaints Chief Complaint  Patient presents with  . Cough    all night, thick, white mucus, no fever  . Wheezing  . Urinary Tract Infection    blood coming out too   HPI:  Pt is here for a sick visit. She has severe asthma and was exposed to smoking over the weekend. She noticed blood in her urine, she has these type of syptoms before. She is a nurse   Asthma  She complains of chest tightness, cough, difficulty breathing, shortness of breath, sputum production and wheezing. This is a recurrent problem. The current episode started in the past 7 days. The problem occurs constantly. The problem has been unchanged. The cough is productive and hacking. Associated symptoms include postnasal drip. Pertinent negatives include no fever. Her symptoms are aggravated by any activity. Her symptoms are alleviated by nothing. She reports no improvement on treatment. Her symptoms are not alleviated by OTC cough suppressant. Her past medical history is significant for asthma.     Current Medication:  Outpatient Encounter Medications as of 12/04/2017  Medication Sig Note  . albuterol (PROVENTIL HFA;VENTOLIN HFA) 108 (90 Base) MCG/ACT inhaler Inhale 2 puffs into the lungs every 6 (six) hours as needed for wheezing or shortness of breath.   [name] atorvastatin (LIPITOR) 20 MG tablet TAKE 1 TABLET BY MOUTH  DAILY   . Fluticasone-Salmeterol (ADVAIR DISKUS) 250-50 MCG/DOSE AEPB Inhale 1 puff into the lungs 2 (two) times daily.   . furosemide (LASIX) 20 MG tablet TAKE 1 TABLET BY MOUTH  DAILY AS NEEDED FOR FLUID  RETENTION (Patient taking differently: prn)   . gabapentin (NEURONTIN) 100 MG capsule Take 1 capsule (100 mg total) by mouth 3 (three) times daily.   [name] glucose blood test strip OneTouch Verio Fex.    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 losartan (COZAAR) 100 MG tablet TAKE 1 TABLET BY MOUTH  DAILY   . metFORMIN (GLUCOPHAGE) 1000 MG tablet TAKE 1 TABLET BY MOUTH TWO  TIMES DAILY WITH A MEAL   . montelukast (SINGULAIR) 10 MG tablet Take 1 tablet (10 mg total) by mouth at bedtime.   [name] omeprazole (PRILOSEC) 20 MG capsule TAKE 1 CAPSULE BY MOUTH TWO TIMES DAILY   . PARoxetine (PAXIL) 20 MG tablet TAKE 1 TABLET BY MOUTH  EVERY MORNING   . potassium chloride SA (K-DUR,KLOR-CON) 20 MEQ tablet TAKE 1 TABLET BY MOUTH  DAILY WHEN USING FUROSEMIDE (Patient taking differently: prn)   . promethazine (PHENERGAN) 25 MG tablet Take 1 tablet (25 mg total) by mouth every 8 (eight) hours as needed for nausea or vomiting.   . tiotropium (SPIRIVA) 18 MCG inhalation capsule Place 1 capsule (18 mcg total) into inhaler and inhale daily.   [name] triamterene-hydrochlorothiazide (MAXZIDE-25) 37.5-25 MG tablet TAKE 1 TABLET BY MOUTH  DAILY   . acyclovir (ZOVIRAX) 400 MG tablet TAKE ONE TABLET BY MOUTH  FIVE TIMES DAILY (Patient taking differently: prn)   . metFORMIN (GLUCOPHAGE) 500 MG tablet Take 1 tablet (500 mg total) by mouth 2 (two) times daily with a meal. (Patient not taking: Reported on 12/04/2017)   . predniSONE (DELTASONE) 10 MG tablet 6 tablets on Day 1 , then reduce by 1 tablet daily until gone (Patient not taking: Reported on 12/04/2017)   .  sitaGLIPtin (JANUVIA) 50 MG tablet Take 1 tablet (50 mg total) by mouth daily. (Patient not taking: Reported on 12/04/2017)   . traMADol (ULTRAM) 50 MG tablet Take 1 tablet (50 mg total) every 6 (six) hours as needed by mouth. (Patient not taking: Reported on 12/04/2017)   . zolpidem (AMBIEN) 10 MG tablet Take 1 tablet (10 mg total) by mouth at bedtime as needed for sleep. 12/04/2017: Needs to be refilled   Facility-Administered Encounter Medications as of 12/04/2017  Medication  . pneumococcal 13-valent conjugate vaccine (PREVNAR 13) injection 0.5 mL      Medical  History: Past Medical History:  Diagnosis Date  . Asthma 1990  . Obesity, Class III, BMI 40-49.9 (morbid obesity) (HCC)    weight fluctuations of 100 lbs      Vital Signs: BP 140/82   Pulse 61   Temp (!) 97.4 F (36.3 C)   Ht 5\' 7"  (1.702 m)   Wt (!) 318 lb 12.8 oz (144.6 kg)   SpO2 93%   BMI 49.93 kg/m    Review of Systems  Constitutional: Positive for fatigue. Negative for diaphoresis and fever.  HENT: Positive for congestion, postnasal drip and sinus pain.   Eyes: Negative.   Respiratory: Positive for cough, sputum production, chest tightness, shortness of breath and wheezing.   Cardiovascular: Negative.   Gastrointestinal: Negative.   Genitourinary: Positive for dysuria and hematuria. Negative for decreased urine volume, pelvic pain and vaginal discharge.  Musculoskeletal: Negative.   Allergic/Immunologic: Positive for environmental allergies.  Neurological: Negative.   Hematological: Negative.   Psychiatric/Behavioral: Negative.     Physical Exam  Constitutional: She is oriented to person, place, and time. She appears well-developed and well-nourished. No distress.  HENT:  Head: Normocephalic and atraumatic.  Mouth/Throat: Oropharynx is clear and moist. No oropharyngeal exudate.  Eyes: EOM are normal. Pupils are equal, round, and reactive to light.  Neck: Normal range of motion. Neck supple. No JVD present. No tracheal deviation present. No thyromegaly present.  Cardiovascular: Normal rate, regular rhythm and normal heart sounds. Exam reveals no gallop and no friction rub.  No murmur heard. Pulmonary/Chest: She is in respiratory distress. She has wheezes. She has no rales. She exhibits tenderness.  Abdominal: Soft. Bowel sounds are normal.  Musculoskeletal: Normal range of motion.  Lymphadenopathy:    She has no cervical adenopathy.  Neurological: She is alert and oriented to person, place, and time. No cranial nerve deficit.  Skin: Skin is warm and dry. She is  not diaphoretic.  Psychiatric: She has a normal mood and affect. Her behavior is normal. Judgment and thought content normal.      Assessment/Plan: 1. Acute bronchitis with chronic obstructive pulmonary disease (COPD) ([place]) Start Meds as prescribed today   2. Essential hypertension, benign stable  3. Hematuria due to acute cystitis Has h/o. Will treat with bactrim, might need bladder u/s   4. Cough productive of purulent sputum Tussin and prednisone taper   General Counseling : I have discussed the findings of the evaluation and examination with Baani.  I have also discussed any further diagnostic evaluation that may be needed or ordered today. Richanda verbalizes understanding of the findings of todays visit. We also reviewed her medications today. she has been encouraged to call the office with any questions or concerns that should arise related to todays visit.    Counseling: - Avoid asthma triggers   Time spent:25

## 2017-12-09 DIAGNOSIS — G4733 Obstructive sleep apnea (adult) (pediatric): Secondary | ICD-10-CM | POA: Diagnosis not present

## 2017-12-11 ENCOUNTER — Ambulatory Visit: Payer: Medicare Other | Admitting: Internal Medicine

## 2017-12-11 DIAGNOSIS — N3001 Acute cystitis with hematuria: Secondary | ICD-10-CM | POA: Insufficient documentation

## 2017-12-11 DIAGNOSIS — J440 Chronic obstructive pulmonary disease with acute lower respiratory infection: Principal | ICD-10-CM

## 2017-12-11 DIAGNOSIS — J9611 Chronic respiratory failure with hypoxia: Secondary | ICD-10-CM

## 2017-12-11 DIAGNOSIS — J209 Acute bronchitis, unspecified: Secondary | ICD-10-CM | POA: Insufficient documentation

## 2017-12-11 HISTORY — DX: Chronic respiratory failure with hypoxia: J96.11

## 2017-12-16 DIAGNOSIS — G4733 Obstructive sleep apnea (adult) (pediatric): Secondary | ICD-10-CM | POA: Diagnosis not present

## 2017-12-22 ENCOUNTER — Encounter: Payer: Self-pay | Admitting: Internal Medicine

## 2017-12-24 ENCOUNTER — Institutional Professional Consult (permissible substitution): Payer: Self-pay | Admitting: Neurology

## 2017-12-24 ENCOUNTER — Ambulatory Visit: Payer: Self-pay | Admitting: Neurology

## 2017-12-30 DIAGNOSIS — G4733 Obstructive sleep apnea (adult) (pediatric): Secondary | ICD-10-CM | POA: Diagnosis not present

## 2018-01-25 ENCOUNTER — Ambulatory Visit (INDEPENDENT_AMBULATORY_CARE_PROVIDER_SITE_OTHER): Payer: Medicare Other | Admitting: Internal Medicine

## 2018-01-25 ENCOUNTER — Encounter: Payer: Self-pay | Admitting: Internal Medicine

## 2018-01-25 VITALS — BP 144/88 | HR 77 | Temp 98.0°F | Resp 16 | Ht 67.0 in | Wt 331.8 lb

## 2018-01-25 DIAGNOSIS — E538 Deficiency of other specified B group vitamins: Secondary | ICD-10-CM

## 2018-01-25 DIAGNOSIS — E785 Hyperlipidemia, unspecified: Secondary | ICD-10-CM | POA: Diagnosis not present

## 2018-01-25 DIAGNOSIS — E119 Type 2 diabetes mellitus without complications: Secondary | ICD-10-CM

## 2018-01-25 DIAGNOSIS — M542 Cervicalgia: Secondary | ICD-10-CM

## 2018-01-25 DIAGNOSIS — E66813 Obesity, class 3: Secondary | ICD-10-CM

## 2018-01-25 DIAGNOSIS — I10 Essential (primary) hypertension: Secondary | ICD-10-CM | POA: Diagnosis not present

## 2018-01-25 LAB — LIPID PANEL
Cholesterol: 132 mg/dL (ref 0–200)
HDL: 55.9 mg/dL (ref >39.00)
LDL Cholesterol: 57 mg/dL (ref 0–99)
NONHDL: 75.6
Total CHOL/HDL Ratio: 2
Triglycerides: 94 mg/dL (ref 0.0–149.0)
VLDL: 18.8 mg/dL (ref 0.0–40.0)

## 2018-01-25 LAB — COMPREHENSIVE METABOLIC PANEL
ALK PHOS: 68 U/L (ref 39–117)
ALT: 19 U/L (ref 0–35)
AST: 18 U/L (ref 0–37)
Albumin: 3.8 g/dL (ref 3.5–5.2)
BILIRUBIN TOTAL: 0.8 mg/dL (ref 0.2–1.2)
BUN: 13 mg/dL (ref 6–23)
CO2: 31 mEq/L (ref 19–32)
CREATININE: 0.61 mg/dL (ref 0.40–1.20)
Calcium: 9.7 mg/dL (ref 8.4–10.5)
Chloride: 101 mEq/L (ref 96–112)
GFR: 104.08 mL/min (ref >60.00)
GLUCOSE: 129 mg/dL — AB (ref 70–99)
Potassium: 3.5 mEq/L (ref 3.5–5.1)
Sodium: 138 mEq/L (ref 135–145)
TOTAL PROTEIN: 6.6 g/dL (ref 6.0–8.3)

## 2018-01-25 LAB — VITAMIN B12: Vitamin B-12: 294 pg/mL (ref 211–911)

## 2018-01-25 LAB — HEMOGLOBIN A1C: HEMOGLOBIN A1C: 6.6 % — AB (ref 4.6–6.5)

## 2018-01-25 MED ORDER — ZOSTER VAC RECOMB ADJUVANTED 50 MCG/0.5ML IM SUSR: 0.5000 mL | Freq: Once | INTRAMUSCULAR | 1 refills | Status: AC

## 2018-01-25 MED ORDER — OSELTAMIVIR PHOSPHATE 75 MG PO CAPS: 75.0000 mg | ORAL_CAPSULE | Freq: Every day | ORAL | 0 refills | Status: DC

## 2018-01-25 MED ORDER — GABAPENTIN 300 MG PO CAPS: 600.0000 mg | ORAL_CAPSULE | Freq: Three times a day (TID) | ORAL | 1 refills | Status: DC

## 2018-01-25 NOTE — Patient Instructions (Signed)
  The ShingRx vaccine is now available in local pharmacies and is much more protective thant Zostavaxs,  It is therefore ADVISED for all interested adults over 50 to prevent shingles   I have given you scripts for Shingrx and TamiflU.   Start the tamiflu if you have  A  flu exposure. increase dose to twice daily if you develop flu symptomss and call for refill   YOU ARE DUE FOR YOUR  ANNUAL   EYE EXAM  I HAVE INCREASED THE GABAPENTIN TO 600 MG THREE TIMES DAILY

## 2018-01-25 NOTE — Progress Notes (Signed)
I have retrieved copy from cologuard. Given to [place]

## 2018-01-25 NOTE — Progress Notes (Addendum)
Subjective:  Patient ID: Katie Huber, female    DOB: [birth year]  Age: 67 y.o. MRN: [id number]  CC: The primary encounter diagnosis was Hyperlipidemia LDL goal <100. Diagnoses of Diabetes mellitus without complication ([place]), L38 deficiency, Obesity, Class III, BMI 40-49.9 (morbid obesity) ([place]), Essential hypertension, benign, and Cervicalgia were also pertinent to this visit.  HPI Katie Huber presents for 4  month follow up on diabetes , COPD and morbid obesity with OSA and pulmonary hypertesion   Patient has  Worsening cervicalgia with radiation to both arms.  She has been under the care of neurology and is taking gabapentin.  MRI of cervical spine done in Oct 2018  noted  Progression of mild anterolisthesis at C5-6, unchanged mild anterolisthesiis at the C7-T1 level without significant spinal or foraminal stenosis .  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  Fasting sugars have been under less than 140 most of the time and post prandials have been under 160 except on rare occasions. Patient is not exercising  Or   trying to lose weight .  Patient  had an eye exam in Nov 2018 and checks feet regularly for signs of infection.  Patient does not walk barefoot outside,  And denies any numbness tingling or burning in feet. Patient is up to date on all recommended vaccinations  Lab Results  Component Value Date   HGBA1C 6.6 (H) 01/25/2018    Lab Results  Component Value Date   MICROALBUR 0.7 09/09/2017    Taking gabapentin 300 mg tid for both arms   Taking b12 injections and vit d megadose  Outpatient Medications Prior to Visit  Medication Sig Dispense Refill  . acyclovir (ZOVIRAX) 400 MG tablet TAKE ONE TABLET BY MOUTH  FIVE TIMES DAILY (Patient taking differently: prn) 180  tablet 1  . atorvastatin (LIPITOR) 20 MG tablet TAKE 1 TABLET BY MOUTH  DAILY 90 tablet 1  . Fluticasone-Salmeterol (ADVAIR DISKUS) 250-50 MCG/DOSE AEPB Inhale 1 puff into the lungs 2 (two) times daily. 60 each 2  . glucose blood test strip OneTouch Verio Fex.    Test one time daily for steroid induced diabetes 100 each 0  . losartan (COZAAR) 100 MG tablet TAKE 1 TABLET BY MOUTH  DAILY 90 tablet 1  . metFORMIN (GLUCOPHAGE) 1000 MG tablet TAKE 1 TABLET BY MOUTH TWO  TIMES DAILY WITH A MEAL 180 tablet 2  . montelukast (SINGULAIR) 10 MG tablet Take 1 tablet (10 mg total) by mouth at bedtime. 90 tablet 2  . omeprazole (PRILOSEC) 20 MG capsule TAKE 1 CAPSULE BY MOUTH TWO TIMES DAILY 180 capsule 1  . PARoxetine (PAXIL) 20 MG tablet TAKE 1 TABLET BY MOUTH  EVERY MORNING 90 tablet 1  . potassium chloride SA (K-DUR,KLOR-CON) 20 MEQ tablet TAKE 1 TABLET BY MOUTH  DAILY WHEN USING FUROSEMIDE (Patient taking differently: prn) 90 tablet 1  . predniSONE (DELTASONE) 10 MG tablet 6 tablets on Day 1 , then reduce by 1 tablet daily until gone 21 tablet 0  . promethazine (PHENERGAN) 25 MG tablet Take 1 tablet (25 mg total) by mouth every 8 (eight) hours as needed for nausea or vomiting. 90 tablet 0  . triamterene-hydrochlorothiazide (MAXZIDE-25) 37.5-25 MG tablet TAKE 1 TABLET BY MOUTH  DAILY 90 tablet  1  . albuterol (PROVENTIL HFA;VENTOLIN HFA) 108 (90 Base) MCG/ACT inhaler Inhale 2 puffs into the lungs every 6 (six) hours as needed for wheezing or shortness of breath. 1 Inhaler 0  . furosemide (LASIX) 20 MG tablet TAKE 1 TABLET BY MOUTH  DAILY AS NEEDED FOR FLUID  RETENTION (Patient taking differently: prn) 90 tablet 1  . gabapentin (NEURONTIN) 300 MG capsule     . zolpidem (AMBIEN) 10 MG tablet Take 1 tablet (10 mg total) by mouth at bedtime as needed for sleep. 15 tablet 1  . chlorpheniramine-HYDROcodone (TUSSIONEX PENNKINETIC ER) 10-8 MG/5ML SUER Take 5 mLs by mouth 2 (two) times daily. (Patient not taking:  Reported on 01/25/2018) 140 mL 0  . gabapentin (NEURONTIN) 100 MG capsule Take 1 capsule (100 mg total) by mouth 3 (three) times daily. (Patient not taking: Reported on 01/25/2018) 90 capsule 3  . metFORMIN (GLUCOPHAGE) 500 MG tablet Take 1 tablet (500 mg total) by mouth 2 (two) times daily with a meal. (Patient not taking: Reported on 12/04/2017) 180 tablet 0  . sitaGLIPtin (JANUVIA) 50 MG tablet Take 1 tablet (50 mg total) by mouth daily. (Patient not taking: Reported on 12/04/2017) 30 tablet 5  . tiotropium (SPIRIVA) 18 MCG inhalation capsule Place 1 capsule (18 mcg total) into inhaler and inhale daily. (Patient not taking: Reported on 01/25/2018) 90 capsule 2  . traMADol (ULTRAM) 50 MG tablet Take 1 tablet (50 mg total) every 6 (six) hours as needed by mouth. (Patient not taking: Reported on 12/04/2017) 120 tablet 3   Facility-Administered Medications Prior to Visit  Medication Dose Route Frequency Provider Last Rate Last Dose  . pneumococcal 13-valent conjugate vaccine (PREVNAR 13) injection 0.5 mL  0.5 mL Intramuscular Once [name], MD        Review of Systems;  Patient denies headache, fevers, malaise, unintentional weight loss, skin rash, eye pain, sinus congestion and sinus pain, sore throat, dysphagia,  hemoptysis , cough, dyspnea, wheezing, chest pain, palpitations, orthopnea, edema, abdominal pain, nausea, melena, diarrhea, constipation, flank pain, dysuria, hematuria, urinary  Frequency, nocturia, , seizures,  Focal weakness, Loss of consciousness,  Tremor, insomnia, depression, anxiety, and suicidal ideation.      Objective:  BP (!) 144/88 (BP Location: Left Arm, Patient Position: Sitting, Cuff Size: Large)   Pulse 77   Temp 98 F (36.7 C) (Oral)   Resp 16   Ht 5\' 7"  (1.702 m)   Wt (!) 331 lb 12.8 oz (150.5 kg)   SpO2 92%   BMI 51.97 kg/m   BP Readings from Last 3 Encounters:  01/25/18 (!) 144/88  12/04/17 140/82  09/09/17 (!) 146/70    Wt Readings from Last 3  Encounters:  01/25/18 (!) 331 lb 12.8 oz (150.5 kg)  12/04/17 (!) 318 lb 12.8 oz (144.6 kg)  09/09/17 (!) 321 lb (145.6 kg)    General appearance: alert, cooperative and appears stated age Ears: normal TM's and external ear canals both ears Throat: lips, mucosa, and tongue normal; teeth and gums normal Neck: no adenopathy, no carotid bruit, supple, symmetrical, trachea midline and thyroid not enlarged, symmetric, no tenderness/mass/nodules Back: symmetric, no curvature. ROM normal. No CVA tenderness. Lungs: clear to auscultation bilaterally Heart: regular rate and rhythm, S1, S2 normal, no murmur, click, rub or gallop Abdomen: soft, non-tender; bowel sounds normal; no masses,  no organomegaly Pulses: 2+ and symmetric Skin: Skin color, texture, turgor normal. No rashes or lesions Lymph nodes: Cervical, supraclavicular, and axillary nodes normal.  Lab Results  Component Value Date   HGBA1C 6.6 (H) 01/25/2018   HGBA1C 6.1 09/09/2017   HGBA1C 6.4 06/05/2017    Lab Results  Component Value Date   CREATININE 0.61 01/25/2018   CREATININE 0.61 09/09/2017   CREATININE 0.72 06/05/2017    Lab Results  Component Value Date   WBC 5.7 03/09/2017   HGB 13.0 03/09/2017   HCT 38.6 03/09/2017   PLT 196 03/09/2017   GLUCOSE 129 (H) 01/25/2018   CHOL 132 01/25/2018   TRIG 94.0 01/25/2018   HDL 55.90 01/25/2018   LDLDIRECT 69.0 06/05/2017   LDLCALC 57 01/25/2018   ALT 19 01/25/2018   AST 18 01/25/2018   NA 138 01/25/2018   K 3.5 01/25/2018   CL 101 01/25/2018   CREATININE 0.61 01/25/2018   BUN 13 01/25/2018   CO2 31 01/25/2018   TSH 1.08 06/08/2014   HGBA1C 6.6 (H) 01/25/2018   MICROALBUR 0.7 09/09/2017    Mr Cervical Spine Wo Contrast  Result Date: 09/29/2017 CLINICAL DATA:  Cervicalgia EXAM: MRI CERVICAL SPINE WITHOUT CONTRAST TECHNIQUE: Multiplanar, multisequence MR imaging of the cervical spine was performed. No intravenous contrast was administered. COMPARISON:  Cervical  MRI 07/31/2011 FINDINGS: Alignment: Mild anterolisthesis C5-6 has progressed. Mild anterolisthesis C7-T1 unchanged. Vertebrae: Negative for fracture or mass. Cord: Normal in signal and morphology Posterior Fossa, vertebral arteries, paraspinal tissues: Negative Disc levels: C2-3:  Negative C3-4:  Negative C4-5:  Negative C5-6: Mild anterolisthesis. Mild disc and facet degeneration. Negative for spinal or foraminal stenosis C6-7:  Negative C7-T1: Mild anterolisthesis. Mild facet degeneration on the left. No significant spinal or foraminal stenosis. IMPRESSION: Mild degenerative changes C5-6 and C7-T1. Negative for disc protrusion or spinal stenosis. Electronically Signed   By: Franchot Gallo M.D.   On: 09/29/2017 13:00    Assessment & Plan:   Problem List Items Addressed This Visit    Cervicalgia    Etiology unclear.  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  She has received PT by  [facility] ordered by Neurologist Dr  Manuella Ghazi  And is taking gabapentin .  B12 level has been low in the past and she is no longer taking injections.  Repeat level ,  Consider repeat EMG.[place] studies        Obesity, Class III, BMI 40-49.9 (morbid obesity) ([place])    I have encouraged  Continued  Efforts at weight loss with goal of 10% of body weight over the next 6 months using a low glycemic index diet and regular exercise a minimum of 5 days per week.         Hyperlipidemia LDL goal <100 - Primary    Based on untreated  lipid profile, the risk of clinically significant CAD is 17% over the next 10 years.  She has been taking Lipitor with good tolerance and excellent results.  No changes today Lab Results  Component Value Date   CHOL 132 01/25/2018   HDL 55.90 01/25/2018   LDLCALC 57 01/25/2018   LDLDIRECT 69.0 06/05/2017   TRIG 94.0 01/25/2018   CHOLHDL 2 01/25/2018   Lab Results    Component Value Date   ALT 19 01/25/2018   AST 18 01/25/2018   ALKPHOS 68 01/25/2018   BILITOT 0.8 01/25/2018         Relevant Orders   Lipid panel (Completed)   Diabetes  mellitus without complication ([place])    Currently well-controlled on current medications .  hemoglobin A1c is at goal of less than 7.0 . Patient is reminded to schedule an annual eye exam and foot exam is normal today. Patient has no microalbuminuria. Patient is tolerating statin therapy for CAD risk reduction and on ACE/ARB for renal protection and hypertension   Lab Results  Component Value Date   HGBA1C 6.6 (H) 01/25/2018   Lab Results  Component Value Date   MICROALBUR 0.7 09/09/2017         Relevant Orders   Hemoglobin A1c (Completed)   Comprehensive metabolic panel (Completed)   Essential hypertension, benign    Well controlled on current regimen. Renal function stable, no changes today.  Lab Results  Component Value Date   CREATININE 0.61 01/25/2018   . Lab Results  Component Value Date   NA 138 01/25/2018   K 3.5 01/25/2018   CL 101 01/25/2018   CO2 31 01/25/2018         B12 deficiency    secondary to metformin use. managed with injections .advised to resume weekly injections.   Lab Results  Component Value Date   [id number] 829 01/25/2018         Relevant Orders   Intrinsic Factor Antibodies (Completed)   B12 (Completed)      I have discontinued Katie Huber's tiotropium, gabapentin, sitaGLIPtin, traMADol, and chlorpheniramine-HYDROcodone. I have also changed her gabapentin. Additionally, I am having her start on oseltamivir, Zoster Vaccine Adjuvanted, cyanocobalamin, and SYRINGE 3CC/25GX1". Lastly, I am having her maintain her promethazine, zolpidem, glucose blood, Fluticasone-Salmeterol, acyclovir, potassium chloride SA, montelukast, triamterene-hydrochlorothiazide, metFORMIN, losartan, omeprazole, PARoxetine, atorvastatin, and predniSONE. We will continue to administer  pneumococcal 13-valent conjugate vaccine.  Meds ordered this encounter  Medications  . oseltamivir (TAMIFLU) 75 MG capsule    Sig: Take 1 capsule (75 mg total) by mouth daily.    Dispense:  10 capsule    Refill:  0  . gabapentin (NEURONTIN) 300 MG capsule    Sig: Take 2 capsules (600 mg total) by mouth 3 (three) times daily.    Dispense:  180 capsule    Refill:  1  . Zoster Vaccine Adjuvanted [facility]) injection    Sig: Inject 0.5 mLs into the muscle once for 1 dose.    Dispense:  1 each    Refill:  1  . cyanocobalamin (,VITAMIN B-12,) 1000 MCG/ML injection    Sig: 1 ml injected IM weekly x 4, then monthly thereafter    Dispense:  10 mL    Refill:  0  . Syringe/Needle, Disp, (SYRINGE 3CC/25GX1") 25G X 1" 3 ML MISC    Sig: Use for b12 injections    Dispense:  50 each    Refill:  0    Medications Discontinued During This Encounter  Medication Reason  . chlorpheniramine-HYDROcodone (TUSSIONEX PENNKINETIC ER) 10-8 MG/5ML SUER Completed Course  . metFORMIN (GLUCOPHAGE) 500 MG tablet Patient has not taken in last 30 days  . sitaGLIPtin (JANUVIA) 50 MG tablet Patient has not taken in last 30 days  . tiotropium (SPIRIVA) 18 MCG inhalation capsule Patient has not taken in last 30 days  . traMADol (ULTRAM) 50 MG tablet Patient has not taken in last 30 days  . gabapentin (NEURONTIN) 562 MG capsule Duplicate  . gabapentin (NEURONTIN) 300 MG capsule     Follow-up: Return in about 6 months (around 07/25/2018) for follow up diabetes.   [name], MD

## 2018-01-26 ENCOUNTER — Telehealth: Payer: Self-pay | Admitting: Internal Medicine

## 2018-01-26 ENCOUNTER — Encounter: Payer: Self-pay | Admitting: Internal Medicine

## 2018-01-26 DIAGNOSIS — E538 Deficiency of other specified B group vitamins: Secondary | ICD-10-CM | POA: Insufficient documentation

## 2018-01-26 MED ORDER — CYANOCOBALAMIN 1000 MCG/ML IJ SOLN: INTRAMUSCULAR | 0 refills | Status: DC

## 2018-01-26 MED ORDER — "SYRINGE 25G X 1"" 3 ML MISC": 0 refills | Status: DC

## 2018-01-26 NOTE — Assessment & Plan Note (Signed)
I have encouraged  Continued  Efforts at weight loss with goal of 10% of body weight over the next 6 months using a low glycemic index diet and regular exercise a minimum of 5 days per week.

## 2018-01-26 NOTE — Assessment & Plan Note (Signed)
Well controlled on current regimen. Renal function stable, no changes today.  Lab Results  Component Value Date   CREATININE 0.61 01/25/2018   . Lab Results  Component Value Date   NA 138 01/25/2018   K 3.5 01/25/2018   CL 101 01/25/2018   CO2 31 01/25/2018

## 2018-01-26 NOTE — Assessment & Plan Note (Signed)
secondary to metformin use. managed with injections .advised to resume weekly injections.   Lab Results  Component Value Date   [id number] 462 01/25/2018

## 2018-01-26 NOTE — Assessment & Plan Note (Signed)
Based on untreated  lipid profile, the risk of clinically significant CAD is 17% over the next 10 years.  She has been taking Lipitor with good tolerance and excellent results.  No changes today Lab Results  Component Value Date   CHOL 132 01/25/2018   HDL 55.90 01/25/2018   LDLCALC 57 01/25/2018   LDLDIRECT 69.0 06/05/2017   TRIG 94.0 01/25/2018   CHOLHDL 2 01/25/2018   Lab Results  Component Value Date   ALT 19 01/25/2018   AST 18 01/25/2018   ALKPHOS 68 01/25/2018   BILITOT 0.8 01/25/2018

## 2018-01-26 NOTE — Telephone Encounter (Signed)
My Chart message sent

## 2018-01-26 NOTE — Assessment & Plan Note (Signed)
Currently well-controlled on current medications .  hemoglobin A1c is at goal of less than 7.0 . Patient is reminded to schedule an annual eye exam and foot exam is normal today. Patient has no microalbuminuria. Patient is tolerating statin therapy for CAD risk reduction and on ACE/ARB for renal protection and hypertension   Lab Results  Component Value Date   HGBA1C 6.6 (H) 01/25/2018   Lab Results  Component Value Date   MICROALBUR 0.7 09/09/2017

## 2018-01-27 ENCOUNTER — Other Ambulatory Visit: Payer: Self-pay | Admitting: Internal Medicine

## 2018-01-27 LAB — INTRINSIC FACTOR ANTIBODIES: INTRINSIC FACTOR: NEGATIVE

## 2018-01-28 ENCOUNTER — Telehealth: Payer: Self-pay | Admitting: Internal Medicine

## 2018-01-28 DIAGNOSIS — G4733 Obstructive sleep apnea (adult) (pediatric): Secondary | ICD-10-CM | POA: Diagnosis not present

## 2018-01-28 MED ORDER — ALBUTEROL SULFATE HFA 108 (90 BASE) MCG/ACT IN AERS: 2.0000 | INHALATION_SPRAY | Freq: Four times a day (QID) | RESPIRATORY_TRACT | 0 refills | Status: DC | PRN

## 2018-01-28 NOTE — Telephone Encounter (Signed)
Copied from [place]. Topic: Quick Communication - Rx Refill/Question >> Jan 28, 2018 10:00 AM Aurelio Brash B wrote: Medication: albuterol (PROVENTIL HFA;VENTOLIN HFA) 108 (90 Base) MCG/ACT inhaler    Putting high priority because pt says she has 2 puffs left   Has the patient contacted their pharmacy? No she said its mail order    (Agent: If no, request that the patient contact the pharmacy for the refill.)   Preferred Pharmacy (with phone number or street name): [id number] [place], [place] - [address] [phone] (Phone) [phone] (Fax)    Agent: Please be advised that RX refills may take up to 3 business days. We ask that you follow-up with your pharmacy.

## 2018-01-28 NOTE — Telephone Encounter (Signed)
LOV 01/25/18 with Dr. Derrel Nip / Refill request for Proventil / Refilled per protocol / Refill sent to requested pharmacy

## 2018-02-01 ENCOUNTER — Telehealth: Payer: Self-pay | Admitting: *Deleted

## 2018-02-01 DIAGNOSIS — R200 Anesthesia of skin: Secondary | ICD-10-CM | POA: Diagnosis not present

## 2018-02-01 DIAGNOSIS — E538 Deficiency of other specified B group vitamins: Secondary | ICD-10-CM | POA: Diagnosis not present

## 2018-02-01 DIAGNOSIS — M79601 Pain in right arm: Secondary | ICD-10-CM | POA: Diagnosis not present

## 2018-02-01 DIAGNOSIS — E559 Vitamin D deficiency, unspecified: Secondary | ICD-10-CM | POA: Diagnosis not present

## 2018-02-01 NOTE — Telephone Encounter (Signed)
Please advise 

## 2018-02-01 NOTE — Telephone Encounter (Signed)
Copied from [place] [phone]. Topic: Inquiry >> Feb 01, 2018  3:22 PM Oliver Pila B wrote: Reason for CRM: [facility] called to have Dr. Derrel Nip contact Benjamine Mola about this pt @ [phone]; no details given

## 2018-02-02 ENCOUNTER — Encounter: Payer: Self-pay | Admitting: Internal Medicine

## 2018-02-02 NOTE — Telephone Encounter (Signed)
Benjamine Mola to return call to office.

## 2018-02-02 NOTE — Telephone Encounter (Signed)
Unable to call from home due to cell phone issues.  Please call the caller and find out what her question is.  The background info is this:  Her cervical radiculopathy is getting worse.  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

## 2018-02-02 NOTE — Assessment & Plan Note (Signed)
Etiology unclear.  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  She has received PT by  [facility] ordered by Neurologist Dr  Manuella Ghazi  And is taking gabapentin .  B12 level has been low in the past and she is no longer taking injections.  Repeat level ,  Consider repeat EMG.[place] studies

## 2018-02-02 NOTE — Telephone Encounter (Signed)
DR. Manuella Ghazi stated the reason patient saw her on 02/01/18 was due to swelling over her body and due to numbness and tingling. , patient was taking 900 mg of gabapentin 3 X per day , PCP script read for patient to take 600 mg TID , pharmacy filled 900 mg  # X per day. DR. Manuella Ghazi feels patient symptoms were coming from the gabapentin at 900 mg 3 X per day and that patient CTS symptoms have resolved with gabapentin and does not  feel a new nerve conduction study is needed at this time. Advised Dr. Manuella Ghazi PCP ordered only 600 mg 3 X times per day not 900 mg 3 X per day.

## 2018-02-07 ENCOUNTER — Other Ambulatory Visit: Payer: Self-pay | Admitting: Internal Medicine

## 2018-02-09 ENCOUNTER — Ambulatory Visit: Payer: Self-pay | Admitting: Internal Medicine

## 2018-02-11 ENCOUNTER — Other Ambulatory Visit: Payer: Self-pay

## 2018-02-11 ENCOUNTER — Telehealth: Payer: Self-pay

## 2018-02-11 MED ORDER — ZOLPIDEM TARTRATE ER 12.5 MG PO TBCR: 12.5000 mg | EXTENDED_RELEASE_TABLET | Freq: Every evening | ORAL | 1 refills | Status: DC | PRN

## 2018-02-11 NOTE — Telephone Encounter (Signed)
As per dr Devona Konig called in pres for Ambien cr 12.5 one qhs 30 with 1 refills and pt verify with pt that she was on 12.5 of ambien not ambien 10 mg

## 2018-02-11 NOTE — Telephone Encounter (Signed)
LMOM TO PT ABOUT AMBIEN WHAT DOSE SHE IS ON

## 2018-02-18 ENCOUNTER — Other Ambulatory Visit: Payer: Self-pay | Admitting: Internal Medicine

## 2018-02-27 DIAGNOSIS — G4733 Obstructive sleep apnea (adult) (pediatric): Secondary | ICD-10-CM | POA: Diagnosis not present

## 2018-03-05 ENCOUNTER — Telehealth: Payer: Self-pay | Admitting: Internal Medicine

## 2018-03-05 DIAGNOSIS — Z1211 Encounter for screening for malignant neoplasm of colon: Secondary | ICD-10-CM

## 2018-03-05 NOTE — Telephone Encounter (Signed)
LMTCB. Need to let pt know that Dr. Derrel Nip has placed the referral for the colonoscopy and that they should be contacting her next week sometime. Also let pt know that if she doesn't here from anyone in a week to please give our office a call back so that we can check on the referral.   [facility] may speak with pt.

## 2018-03-05 NOTE — Telephone Encounter (Signed)
Please advise 

## 2018-03-05 NOTE — Telephone Encounter (Signed)
Copied from [place] #[id number]. Topic: Referral - Request >> Mar 05, 2018  9:52 AM Lennox Solders wrote: Reason for CRM: pt would like to proceed with scheduling colonoscopy. Pt would like to see dr Allen Norris or his partner . [facility] de[facility] in [place] phone [phone]  The patient does not have a referral in the system for Gastroenterology .

## 2018-03-05 NOTE — Addendum Note (Signed)
Addended by: [name] on: 03/05/2018 01:08 PM   Modules accepted: Orders

## 2018-03-05 NOTE — Telephone Encounter (Signed)
  Your referral is in process as discussed. Our referral coordinator will call you when the appointment has been made.  °

## 2018-03-08 ENCOUNTER — Telehealth: Payer: Self-pay

## 2018-03-08 ENCOUNTER — Other Ambulatory Visit: Payer: Self-pay

## 2018-03-08 DIAGNOSIS — Z1211 Encounter for screening for malignant neoplasm of colon: Secondary | ICD-10-CM

## 2018-03-08 NOTE — Telephone Encounter (Signed)
Gastroenterology Pre-Procedure Review  Request Date: 03/29/18 Requesting Physician: Dr. Allen Norris  PATIENT REVIEW QUESTIONS: The patient responded to the following health history questions as indicated:    1. Are you having any GI issues? yes (Frequent Diarrhea) 2. Do you have a personal history of Polyps? no 3. Do you have a family history of Colon Cancer or Polyps? no 4. Diabetes Mellitus? no 5. Joint replacements in the past 12 months?no 6. Major health problems in the past 3 months?no 7. Any artificial heart valves, MVP, or defibrillator?no    MEDICATIONS & ALLERGIES:    Patient reports the following regarding taking any anticoagulation/antiplatelet therapy:   Plavix, Coumadin, Eliquis, Xarelto, Lovenox, Pradaxa, Brilinta, or Effient? no Aspirin? no  Patient confirms/reports the following medications:  Current Outpatient Medications  Medication Sig Dispense Refill  . acyclovir (ZOVIRAX) 400 MG tablet TAKE ONE TABLET BY MOUTH  FIVE TIMES DAILY (Patient taking differently: prn) 180 tablet 1  . ADVAIR DISKUS 250-50 MCG/DOSE AEPB USE 1 INHALATION INTO THE  LUNGS TWO TIMES DAILY 180 each 1  . albuterol (PROVENTIL HFA;VENTOLIN HFA) 108 (90 Base) MCG/ACT inhaler Inhale 2 puffs into the lungs every 6 (six) hours as needed for wheezing or shortness of breath. 1 Inhaler 0  . atorvastatin (LIPITOR) 20 MG tablet TAKE 1 TABLET BY MOUTH  DAILY 90 tablet 1  . cyanocobalamin (,VITAMIN B-12,) 1000 MCG/ML injection 1 ml injected IM weekly x 4, then monthly thereafter 10 mL 0  . furosemide (LASIX) 20 MG tablet TAKE 1 TABLET BY MOUTH  DAILY AS NEEDED FOR FLUID  RETENTION 90 tablet 1  . gabapentin (NEURONTIN) 300 MG capsule Take 2 capsules (600 mg total) by mouth 3 (three) times daily. 180 capsule 1  . glucose blood test strip OneTouch Verio Fex.    Test one time daily for steroid induced diabetes 100 each 0  . losartan (COZAAR) 100 MG tablet TAKE 1 TABLET BY MOUTH  DAILY 90 tablet 1  . metFORMIN  (GLUCOPHAGE) 1000 MG tablet TAKE 1 TABLET BY MOUTH TWO  TIMES DAILY WITH A MEAL 180 tablet 2  . montelukast (SINGULAIR) 10 MG tablet Take 1 tablet (10 mg total) by mouth at bedtime. 90 tablet 2  . omeprazole (PRILOSEC) 20 MG capsule TAKE 1 CAPSULE BY MOUTH TWO TIMES DAILY 180 capsule 1  . oseltamivir (TAMIFLU) 75 MG capsule Take 1 capsule (75 mg total) by mouth daily. 10 capsule 0  . PARoxetine (PAXIL) 20 MG tablet TAKE 1 TABLET BY MOUTH  EVERY MORNING 90 tablet 1  . potassium chloride SA (K-DUR,KLOR-CON) 20 MEQ tablet TAKE 1 TABLET BY MOUTH  DAILY WHEN USING FUROSEMIDE (Patient taking differently: prn) 90 tablet 1  . predniSONE (DELTASONE) 10 MG tablet 6 tablets on Day 1 , then reduce by 1 tablet daily until gone 21 tablet 0  . promethazine (PHENERGAN) 25 MG tablet Take 1 tablet (25 mg total) by mouth every 8 (eight) hours as needed for nausea or vomiting. 90 tablet 0  . promethazine (PHENERGAN) 25 MG tablet TAKE 1 TABLET BY MOUTH  EVERY 8 HOURS AS NEEDED FOR NAUSEA AND VOMITING 90 tablet 0  . Syringe/Needle, Disp, (SYRINGE 3CC/25GX1") 25G X 1" 3 ML MISC Use for b12 injections 50 each 0  . triamterene-hydrochlorothiazide (MAXZIDE-25) 37.5-25 MG tablet TAKE 1 TABLET BY MOUTH  DAILY 90 tablet 1  . zolpidem (AMBIEN CR) 12.5 MG CR tablet Take 1 tablet (12.5 mg total) by mouth at bedtime as needed for sleep. 30 tablet 1  Current Facility-Administered Medications  Medication Dose Route Frequency Provider Last Rate Last Dose  . pneumococcal 13-valent conjugate vaccine (PREVNAR 13) injection 0.5 mL  0.5 mL Intramuscular Once [name], MD        Patient confirms/reports the following allergies:  No Known Allergies  No orders of the defined types were placed in this encounter.   AUTHORIZATION INFORMATION Primary Insurance: 1D#: Group #:  Secondary Insurance: 1D#: Group #:  SCHEDULE INFORMATION: Date: 03/29/18 Time: Location:[facility]

## 2018-03-09 DIAGNOSIS — R7303 Prediabetes: Secondary | ICD-10-CM | POA: Diagnosis not present

## 2018-03-09 DIAGNOSIS — H2513 Age-related nuclear cataract, bilateral: Secondary | ICD-10-CM | POA: Diagnosis not present

## 2018-03-09 DIAGNOSIS — H524 Presbyopia: Secondary | ICD-10-CM | POA: Diagnosis not present

## 2018-03-11 NOTE — Telephone Encounter (Signed)
Called patient and she states that she was notified of appointment with Dr. Allen Norris for her colonoscopy.

## 2018-03-17 ENCOUNTER — Telehealth: Payer: Self-pay

## 2018-03-17 ENCOUNTER — Other Ambulatory Visit: Payer: Self-pay

## 2018-03-17 DIAGNOSIS — Z1211 Encounter for screening for malignant neoplasm of colon: Secondary | ICD-10-CM

## 2018-03-17 NOTE — Telephone Encounter (Signed)
LVM with patient asking her to call office to notify her that anesthesiologist has reviewed her medical history and feels like [place] would be best for her Colonoscopy.  Her procedure has been canceled for 03/29/18 at [facility].  I reordered her colonoscopy for 03/30/18 at [facility] and printed new instructions.  Thanks [employer]

## 2018-03-23 ENCOUNTER — Ambulatory Visit: Payer: Medicare Other | Admitting: Internal Medicine

## 2018-03-23 ENCOUNTER — Encounter: Payer: Self-pay | Admitting: Internal Medicine

## 2018-03-23 VITALS — BP 142/86 | HR 75 | Resp 16 | Ht 67.0 in | Wt 326.8 lb

## 2018-03-23 DIAGNOSIS — G4733 Obstructive sleep apnea (adult) (pediatric): Secondary | ICD-10-CM | POA: Diagnosis not present

## 2018-03-23 DIAGNOSIS — G4700 Insomnia, unspecified: Secondary | ICD-10-CM | POA: Diagnosis not present

## 2018-03-23 DIAGNOSIS — Z9989 Dependence on other enabling machines and devices: Secondary | ICD-10-CM

## 2018-03-23 DIAGNOSIS — I2721 Secondary pulmonary arterial hypertension: Secondary | ICD-10-CM

## 2018-03-23 DIAGNOSIS — J4520 Mild intermittent asthma, uncomplicated: Secondary | ICD-10-CM | POA: Diagnosis not present

## 2018-03-23 MED ORDER — ZOLPIDEM TARTRATE 10 MG PO TABS: 10.0000 mg | ORAL_TABLET | Freq: Every evening | ORAL | 0 refills | Status: DC | PRN
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
## 2018-03-23 NOTE — Patient Instructions (Signed)

## 2018-03-29 ENCOUNTER — Ambulatory Visit: Admit: 2018-03-29 | Payer: Medicare Other | Admitting: Gastroenterology

## 2018-03-29 ENCOUNTER — Encounter: Payer: Self-pay | Admitting: *Deleted

## 2018-03-29 SURGERY — COLONOSCOPY WITH PROPOFOL
Anesthesia: Choice

## 2018-03-30 ENCOUNTER — Encounter
Admission: RE | Disposition: A | Discharge status: Home / Self Care | Payer: Self-pay | Source: Ambulatory Visit | Attending: Gastroenterology

## 2018-03-30 ENCOUNTER — Ambulatory Visit: Payer: Medicare Other | Admitting: Certified Registered Nurse Anesthetist

## 2018-03-30 ENCOUNTER — Encounter: Payer: Self-pay | Admitting: *Deleted

## 2018-03-30 ENCOUNTER — Ambulatory Visit
Admission: RE | Admit: 2018-03-30 | Discharge: 2018-03-30 | Disposition: A | Discharge status: Home / Self Care | Payer: Medicare Other | Source: Ambulatory Visit | Attending: Gastroenterology | Admitting: Gastroenterology

## 2018-03-30 DIAGNOSIS — D128 Benign neoplasm of rectum: Secondary | ICD-10-CM | POA: Diagnosis not present

## 2018-03-30 DIAGNOSIS — Z9884 Bariatric surgery status: Secondary | ICD-10-CM | POA: Insufficient documentation

## 2018-03-30 DIAGNOSIS — J45909 Unspecified asthma, uncomplicated: Secondary | ICD-10-CM | POA: Insufficient documentation

## 2018-03-30 DIAGNOSIS — K219 Gastro-esophageal reflux disease without esophagitis: Secondary | ICD-10-CM | POA: Insufficient documentation

## 2018-03-30 DIAGNOSIS — Z1211 Encounter for screening for malignant neoplasm of colon: Secondary | ICD-10-CM | POA: Diagnosis not present

## 2018-03-30 DIAGNOSIS — Z6841 Body Mass Index (BMI) 40.0 and over, adult: Secondary | ICD-10-CM | POA: Diagnosis not present

## 2018-03-30 DIAGNOSIS — I10 Essential (primary) hypertension: Secondary | ICD-10-CM | POA: Diagnosis not present

## 2018-03-30 DIAGNOSIS — Z79899 Other long term (current) drug therapy: Secondary | ICD-10-CM | POA: Diagnosis not present

## 2018-03-30 DIAGNOSIS — G4730 Sleep apnea, unspecified: Secondary | ICD-10-CM | POA: Diagnosis not present

## 2018-03-30 DIAGNOSIS — D120 Benign neoplasm of cecum: Secondary | ICD-10-CM

## 2018-03-30 DIAGNOSIS — G4733 Obstructive sleep apnea (adult) (pediatric): Secondary | ICD-10-CM | POA: Diagnosis not present

## 2018-03-30 DIAGNOSIS — Z7952 Long term (current) use of systemic steroids: Secondary | ICD-10-CM | POA: Insufficient documentation

## 2018-03-30 DIAGNOSIS — Z7984 Long term (current) use of oral hypoglycemic drugs: Secondary | ICD-10-CM | POA: Diagnosis not present

## 2018-03-30 DIAGNOSIS — K621 Rectal polyp: Secondary | ICD-10-CM | POA: Diagnosis not present

## 2018-03-30 DIAGNOSIS — Z7951 Long term (current) use of inhaled steroids: Secondary | ICD-10-CM | POA: Insufficient documentation

## 2018-03-30 DIAGNOSIS — K635 Polyp of colon: Secondary | ICD-10-CM | POA: Diagnosis not present

## 2018-03-30 DIAGNOSIS — E119 Type 2 diabetes mellitus without complications: Secondary | ICD-10-CM | POA: Diagnosis not present

## 2018-03-30 HISTORY — DX: Essential (primary) hypertension: I10

## 2018-03-30 HISTORY — PX: COLONOSCOPY WITH PROPOFOL: SHX5780

## 2018-03-30 SURGERY — COLONOSCOPY WITH PROPOFOL
Anesthesia: General

## 2018-03-30 MED ORDER — PROPOFOL 10 MG/ML IV BOLUS
INTRAVENOUS | Status: AC
  Filled 2018-03-30: qty 40

## 2018-03-30 MED ORDER — MIDAZOLAM HCL 2 MG/2ML IJ SOLN
INTRAMUSCULAR | Status: DC | PRN
  Administered 2018-03-30: 2 mg via INTRAVENOUS

## 2018-03-30 MED ORDER — PROPOFOL 10 MG/ML IV BOLUS
INTRAVENOUS | Status: DC | PRN
  Administered 2018-03-30 (×4): 20 mg via INTRAVENOUS
  Administered 2018-03-30: 30 mg via INTRAVENOUS
  Administered 2018-03-30 (×5): 20 mg via INTRAVENOUS
  Administered 2018-03-30: 50 mg via INTRAVENOUS
  Administered 2018-03-30 (×2): 20 mg via INTRAVENOUS
  Administered 2018-03-30: 50 mg via INTRAVENOUS

## 2018-03-30 MED ORDER — LIDOCAINE HCL (CARDIAC) PF 100 MG/5ML IV SOSY
PREFILLED_SYRINGE | INTRAVENOUS | Status: DC | PRN
  Administered 2018-03-30: 50 mg via INTRAVENOUS

## 2018-03-30 MED ORDER — MIDAZOLAM HCL 2 MG/2ML IJ SOLN
INTRAMUSCULAR | Status: AC
  Filled 2018-03-30: qty 2

## 2018-03-30 MED ORDER — PHENYLEPHRINE HCL 10 MG/ML IJ SOLN
INTRAMUSCULAR | Status: DC | PRN
  Administered 2018-03-30: 100 ug via INTRAVENOUS

## 2018-03-30 MED ORDER — SODIUM CHLORIDE 0.9 % IV SOLN
INTRAVENOUS | Status: DC
  Administered 2018-03-30: 1000 mL via INTRAVENOUS
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
## 2018-03-30 NOTE — Transfer of Care (Signed)
Immediate Anesthesia Transfer of Care Note  Patient: Katie Huber  Procedure(s) Performed: COLONOSCOPY WITH PROPOFOL (N/A )  Patient Location: PACU  Anesthesia Type:General  Level of Consciousness: drowsy  Airway & Oxygen Therapy: Patient Spontanous Breathing and Patient connected to nasal cannula oxygen  Post-op Assessment: Report given to RN and Post -op Vital signs reviewed and stable  Post vital signs: Reviewed and stable  Last Vitals:  Vitals Value Taken Time  BP 129/49 03/30/2018  9:09 AM  Temp 36.2 C 03/30/2018  9:09 AM  Pulse 76 03/30/2018  9:09 AM  Resp 22 03/30/2018  9:09 AM  SpO2 99 % 03/30/2018  9:09 AM    Last Pain:  Vitals:   03/30/18 0909  TempSrc:   PainSc: 0-No pain      Patients Stated Pain Goal: 0 (43/15/40 0867)  Complications: No apparent anesthesia complications

## 2018-03-30 NOTE — Anesthesia Postprocedure Evaluation (Signed)
Anesthesia Post Note  Patient: Secondary school teacher  Procedure(s) Performed: COLONOSCOPY WITH PROPOFOL (N/A )  Patient location during evaluation: Endoscopy Anesthesia Type: General Level of consciousness: awake and alert Pain management: pain level controlled Vital Signs Assessment: post-procedure vital signs reviewed and stable Respiratory status: spontaneous breathing, nonlabored ventilation, respiratory function stable and patient connected to nasal cannula oxygen Cardiovascular status: blood pressure returned to baseline and stable Postop Assessment: no apparent nausea or vomiting Anesthetic complications: no     Last Vitals:  Vitals:   03/30/18 0909 03/30/18 0919  BP: (!) 129/49 (!) 129/48  Pulse: 76 69  Resp: (!) 22 18  Temp: (!) 36.2 C   SpO2: 99% 100%    Last Pain:  Vitals:   03/30/18 0919  TempSrc:   PainSc: 0-No pain                 Martha Clan

## 2018-03-30 NOTE — Anesthesia Post-op Follow-up Note (Signed)
Anesthesia QCDR form completed.        

## 2018-03-30 NOTE — Anesthesia Procedure Notes (Signed)
Procedure Name: MAC Date/Time: 03/30/2018 8:43 AM Performed by: Rudean Hitt, CRNA Pre-anesthesia Checklist: Patient identified, Emergency Drugs available, Suction available, Patient being monitored and Timeout performed Patient Re-evaluated:Patient Re-evaluated prior to induction Oxygen Delivery Method: Simple face mask

## 2018-03-30 NOTE — Anesthesia Preprocedure Evaluation (Signed)
Anesthesia Evaluation  Patient identified by MRN, date of birth, ID band Patient awake    Reviewed: Allergy & Precautions, H&P , NPO status , Patient's Chart, lab work & pertinent test results, reviewed documented beta blocker date and time   History of Anesthesia Complications Negative for: history of anesthetic complications  Airway Mallampati: III  TM Distance: >3 FB Neck ROM: full    Dental  (+) Caps, Missing, Dental Advidsory Given   Pulmonary neg shortness of breath, asthma , sleep apnea and Continuous Positive Airway Pressure Ventilation , neg COPD, neg recent URI,           Cardiovascular Exercise Tolerance: Good hypertension, (-) angina(-) CAD, (-) Past MI, (-) Cardiac Stents and (-) CABG (-) dysrhythmias (-) Valvular Problems/Murmurs     Neuro/Psych negative neurological ROS  negative psych ROS   GI/Hepatic Neg liver ROS, GERD  ,  Endo/Other  diabetes, Well ControlledMorbid obesity  Renal/GU negative Renal ROS  negative genitourinary   Musculoskeletal   Abdominal   Peds  Hematology negative hematology ROS (+)   Anesthesia Other Findings Past Medical History: 1990: Asthma No date: Hypertension No date: Obesity, Class III, BMI 40-49.9 (morbid obesity) (HCC)     Comment:  weight fluctuations of 100 lbs    Reproductive/Obstetrics negative OB ROS                             Anesthesia Physical Anesthesia Plan  ASA: III  Anesthesia Plan: General   Post-op Pain Management:    Induction: Intravenous  PONV Risk Score and Plan: 3 and Propofol infusion  Airway Management Planned: Nasal Cannula  Additional Equipment:   Intra-op Plan:   Post-operative Plan:   Informed Consent: I have reviewed the patients History and Physical, chart, labs and discussed the procedure including the risks, benefits and alternatives for the proposed anesthesia with the patient or authorized  representative who has indicated his/her understanding and acceptance.   Dental Advisory Given  Plan Discussed with: Anesthesiologist, CRNA and Surgeon  Anesthesia Plan Comments:         Anesthesia Quick Evaluation

## 2018-03-30 NOTE — Op Note (Signed)
[facility] Patient Name: Katie Huber Procedure Date: 03/30/2018 8:41 AM MRN: [id number] Account #: [id number] Date of Birth: 1950-12-06 Admit Type: Outpatient Age: 67 Room: [facility] ENDO ROOM 4 Gender: Female Note Status: Finalized Procedure:            Colonoscopy Indications:          Screening for colorectal malignant neoplasm Providers:            Lucilla Lame MD, MD Referring MD:         Deborra Medina, MD (Referring MD) Medicines:            Propofol per Anesthesia Complications:        No immediate complications. Procedure:            Pre-Anesthesia Assessment:                       - Prior to the procedure, a History and Physical was                        performed, and patient medications and allergies were                        reviewed. The patient's tolerance of previous                        anesthesia was also reviewed. The risks and benefits of                        the procedure and the sedation options and risks were                        discussed with the patient. All questions were                        answered, and informed consent was obtained. Prior                        Anticoagulants: The patient has taken no previous                        anticoagulant or antiplatelet agents. ASA Grade                        Assessment: II - A patient with mild systemic disease.                        After reviewing the risks and benefits, the patient was                        deemed in satisfactory condition to undergo the                        procedure.                       After obtaining informed consent, the colonoscope was                        passed under direct vision. Throughout the procedure,  the patient's blood pressure, pulse, and oxygen                        saturations were monitored continuously. The                        Colonoscope was introduced through the anus and         advanced to the the cecum, identified by appendiceal                        orifice and ileocecal valve. The colonoscopy was                        performed without difficulty. The patient tolerated the                        procedure well. The quality of the bowel preparation                        was excellent. Findings:      The perianal and digital rectal examinations were normal.      A 9 mm polyp was found in the rectum. The polyp was pedunculated. The       polyp was removed with a hot snare. Resection and retrieval were       complete.      A 4 mm polyp was found in the cecum. The polyp was sessile. The polyp       was removed with a cold snare. Resection and retrieval were complete. Impression:           - One 9 mm polyp in the rectum, removed with a hot                        snare. Resected and retrieved.                       - One 4 mm polyp in the cecum, removed with a cold                        snare. Resected and retrieved. Recommendation:       - Discharge patient to home.                       - Resume previous diet.                       - Continue present medications.                       - Await pathology results.                       - Repeat colonoscopy in 5 years if polyp adenoma and 10                        years if hyperplastic Procedure Code(s):    --- Professional ---                       [phone], Colonoscopy, flexible; with removal of tumor(s),  polyp(s), or other lesion(s) by snare technique Diagnosis Code(s):    --- Professional ---                       Z12.11, Encounter for screening for malignant neoplasm                        of colon                       K62.1, Rectal polyp                       D12.0, Benign neoplasm of cecum CPT copyright 2017 American Medical Association. All rights reserved. The codes documented in this report are preliminary and upon coder review may  be revised to meet current compliance  requirements. Lucilla Lame MD, MD 03/30/2018 9:04:17 AM This report has been signed electronically. Number of Addenda: 0 Note Initiated On: 03/30/2018 8:41 AM Scope Withdrawal Time: 0 hours 9 minutes 18 seconds  Total Procedure Duration: 0 hours 14 minutes 2 seconds       [facility]

## 2018-03-31 LAB — SURGICAL PATHOLOGY

## 2018-04-01 ENCOUNTER — Encounter: Payer: Self-pay | Admitting: Gastroenterology

## 2018-04-01 ENCOUNTER — Encounter: Payer: Self-pay | Admitting: Internal Medicine

## 2018-04-01 DIAGNOSIS — D126 Benign neoplasm of colon, unspecified: Secondary | ICD-10-CM | POA: Insufficient documentation

## 2018-04-06 ENCOUNTER — Other Ambulatory Visit: Payer: Self-pay | Admitting: Internal Medicine

## 2018-04-08 ENCOUNTER — Ambulatory Visit: Payer: Medicare Other | Admitting: Gastroenterology

## 2018-04-08 ENCOUNTER — Encounter: Payer: Self-pay | Admitting: Gastroenterology

## 2018-04-08 VITALS — BP 149/68 | HR 75 | Ht 67.0 in | Wt 326.0 lb

## 2018-04-08 DIAGNOSIS — K591 Functional diarrhea: Secondary | ICD-10-CM | POA: Diagnosis not present

## 2018-04-08 NOTE — Progress Notes (Signed)
Primary Care Physician: [name], MD  Primary Gastroenterologist:  Dr. Lucilla Lame  No chief complaint on file.   HPI: Katie Huber is a 67 y.o. female here for follow-up after having colonoscopy.  At the time of colonoscopy the patient had 2 polyps found.  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  The patient had the polyps removed.  The patient reports that prior to having the colonoscopy she has had chronic diarrhea for over a year.  She did not report this to me or her primary care provider.  She states that she thought if she had colitis it would just be seen on the colonoscopy.  She denies any weight loss with the diarrhea. Patient also reports that she has had accidents with soiling herself in having to run home.  The patient also states that she will take Lomotil so that she can go to work but then the next day will have profuse diarrhea when she doesn't take Lomotil.  The patient also states that she does not have any diarrhea when she sleeps at night.  Current Outpatient Medications  Medication Sig Dispense Refill  . acyclovir (ZOVIRAX) 400 MG tablet TAKE ONE TABLET BY MOUTH  FIVE TIMES DAILY (Patient not taking: Reported on 03/30/2018) 180 tablet 1  . ADVAIR DISKUS 250-50 MCG/DOSE AEPB USE 1 INHALATION INTO THE  LUNGS TWO TIMES DAILY 180 each 1  . albuterol (PROVENTIL HFA;VENTOLIN HFA) 108 (90 Base) MCG/ACT inhaler Inhale 2 puffs into the lungs every 6 (six) hours as needed for wheezing or shortness of breath. (Patient not taking: Reported on 03/30/2018) 1 Inhaler 0  . atorvastatin (LIPITOR) 20 MG tablet TAKE 1 TABLET BY MOUTH  DAILY 90 tablet 1  . cyanocobalamin (,VITAMIN B-12,) 1000 MCG/ML injection 1 ml injected IM weekly x 4, then monthly thereafter 10 mL 0  . furosemide (LASIX) 20 MG tablet TAKE 1 TABLET BY MOUTH  DAILY AS NEEDED FOR FLUID  RETENTION (Patient not taking: Reported on 03/30/2018) 90 tablet 1  . gabapentin (NEURONTIN) 300 MG capsule Take 2 capsules  (600 mg total) by mouth 3 (three) times daily. 180 capsule 1  . glucose blood test strip OneTouch Verio Fex.    Test one time daily for steroid induced diabetes 100 each 0  . losartan (COZAAR) 100 MG tablet TAKE 1 TABLET BY MOUTH  DAILY 90 tablet 1  . metFORMIN (GLUCOPHAGE) 1000 MG tablet TAKE 1 TABLET BY MOUTH TWO  TIMES DAILY WITH A MEAL 180 tablet 2  . montelukast (SINGULAIR) 10 MG tablet TAKE 1 TABLET BY MOUTH AT  BEDTIME 90 tablet 1  . omeprazole (PRILOSEC) 20 MG capsule TAKE 1 CAPSULE BY MOUTH TWO TIMES DAILY 180 capsule 1  . oseltamivir (TAMIFLU) 75 MG capsule Take 1 capsule (75 mg total) by mouth daily. (Patient not taking: Reported on 03/30/2018) 10 capsule 0  . PARoxetine (PAXIL) 20 MG tablet TAKE 1 TABLET BY MOUTH  EVERY MORNING 90 tablet 1  . potassium chloride SA (K-DUR,KLOR-CON) 20 MEQ tablet TAKE 1 TABLET BY MOUTH  DAILY WHEN USING FUROSEMIDE (Patient not taking: Reported on 03/30/2018) 90 tablet 1  . predniSONE (DELTASONE) 10 MG tablet 6 tablets on Day 1 , then reduce by 1 tablet daily until gone 21 tablet 0  . promethazine (PHENERGAN) 25 MG tablet Take 1 tablet (25 mg total) by mouth every 8 (eight) hours as needed for nausea or vomiting. 90 tablet 0  . promethazine (PHENERGAN) 25 MG tablet TAKE  1 TABLET BY MOUTH  EVERY 8 HOURS AS NEEDED FOR NAUSEA AND VOMITING 90 tablet 0  . Syringe/Needle, Disp, (SYRINGE 3CC/25GX1") 25G X 1" 3 ML MISC Use for b12 injections 50 each 0  . triamterene-hydrochlorothiazide (MAXZIDE-25) 37.5-25 MG tablet TAKE 1 TABLET BY MOUTH  DAILY 90 tablet 1  . zolpidem (AMBIEN) 10 MG tablet Take 1 tablet (10 mg total) by mouth at bedtime as needed for sleep. 30 tablet 0   Current Facility-Administered Medications  Medication Dose Route Frequency Provider Last Rate Last Dose  . pneumococcal 13-valent conjugate vaccine (PREVNAR 13) injection 0.5 mL  0.5 mL Intramuscular Once [name], MD        Allergies as of 04/08/2018  . (No Known Allergies)     ROS:  General: Negative for anorexia, weight loss, fever, chills, fatigue, weakness. ENT: Negative for hoarseness, difficulty swallowing , nasal congestion. CV: Negative for chest pain, angina, palpitations, dyspnea on exertion, peripheral edema.  Respiratory: Negative for dyspnea at rest, dyspnea on exertion, cough, sputum, wheezing.  GI: See history of present illness. GU:  Negative for dysuria, hematuria, urinary incontinence, urinary frequency, nocturnal urination.  Endo: Negative for unusual weight change.    Physical Examination:   There were no vitals taken for this visit.  General: Well-nourished, well-developed in no acute distress.  Eyes: No icterus. Conjunctivae pink. Extremities: No lower extremity edema. No clubbing or deformities. Neuro: Alert and oriented x 3.  Grossly intact. Skin: Warm and dry, no jaundice.   Psych: Alert and cooperative, normal mood and affect.  Labs:    Imaging Studies: No results found.  Assessment and Plan:   Katie Huber is a 67 y.o. y/o female who has had chronic diarrhea. The patient had not reported the diarrhea to her primary care provider or myself thinking that if she had a colonoscopy that if there was inflammation it would be seen.  Therefore no biopsies were taken to rule out microscopic colitis such as collagenous or lymphocytic colitis.  The patient has been told this and has also been told that since the symptom of diarrhea was not disclosed we did not look in the terminal ileum for possible ileitis.  The patient states that her diarrhea shortly after she eats and does not wake her up at night.  She also does not have any weight loss associated with it.  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  The patient will be started on Vibrozi twice a day.  She will also supplement this with fiber.  The patient will contact me if her symptoms do not improve.    Lucilla Lame, MD. [name]   Note: This  dictation was prepared with Dragon dictation along with smaller phrase technology. Any transcriptional errors that result from this process are unintentional.

## 2018-04-08 NOTE — Patient Instructions (Signed)
Take Citracel twice daily.

## 2018-04-23 ENCOUNTER — Telehealth: Payer: Self-pay

## 2018-04-23 NOTE — Telephone Encounter (Signed)
Pt was given Viberzi 100mg  to try and she stated it making her symptoms worse. She has stopped the medication. Please advise if we can switch to something else.

## 2018-04-24 NOTE — Telephone Encounter (Signed)
So the patient says the diarrhea was worse with viberzi?  Since she did not tell us about the diarrhea before the colonoscopy then the only options are to repeat the colonoscopy with biopsies for possible microscopic colitis or a trial of budesinide 9mg  we to see if she gets better

## 2018-04-28 ENCOUNTER — Other Ambulatory Visit: Payer: Self-pay

## 2018-04-28 MED ORDER — BUDESONIDE 3 MG PO CPEP: 9.0000 mg | ORAL_CAPSULE | ORAL | 1 refills | Status: DC

## 2018-04-28 NOTE — Telephone Encounter (Signed)
LVM for pt to return my call.

## 2018-04-28 NOTE — Telephone Encounter (Signed)
Spoke with pt regarding her symptoms. She would like to try the trial of Budesonide 9mg  daily first before we move forward with another colonoscopy.

## 2018-04-29 DIAGNOSIS — G4733 Obstructive sleep apnea (adult) (pediatric): Secondary | ICD-10-CM | POA: Diagnosis not present

## 2018-05-19 ENCOUNTER — Other Ambulatory Visit: Payer: Self-pay | Admitting: Nurse Practitioner

## 2018-05-19 ENCOUNTER — Other Ambulatory Visit: Payer: Self-pay | Admitting: Internal Medicine

## 2018-05-19 DIAGNOSIS — G4700 Insomnia, unspecified: Secondary | ICD-10-CM

## 2018-05-19 MED ORDER — ZOLPIDEM TARTRATE 10 MG PO TABS: 10.0000 mg | ORAL_TABLET | Freq: Every evening | ORAL | 2 refills | Status: DC | PRN

## 2018-05-19 NOTE — Telephone Encounter (Signed)
Renewed rx for zolpidem 10mg  qhs prn and added 2 refills. [facility].

## 2018-05-19 NOTE — Telephone Encounter (Signed)
CAN YOU PLEASE SEND

## 2018-05-19 NOTE — Progress Notes (Signed)
Renewed rx for zolpidem 10mg  qhs prn and added 2 refills. Sent [facility].

## 2018-05-25 ENCOUNTER — Other Ambulatory Visit: Payer: Self-pay | Admitting: Internal Medicine

## 2018-05-30 DIAGNOSIS — G4733 Obstructive sleep apnea (adult) (pediatric): Secondary | ICD-10-CM | POA: Diagnosis not present

## 2018-06-07 DIAGNOSIS — E538 Deficiency of other specified B group vitamins: Secondary | ICD-10-CM | POA: Diagnosis not present

## 2018-06-07 DIAGNOSIS — E559 Vitamin D deficiency, unspecified: Secondary | ICD-10-CM | POA: Diagnosis not present

## 2018-06-07 DIAGNOSIS — R200 Anesthesia of skin: Secondary | ICD-10-CM | POA: Diagnosis not present

## 2018-06-07 DIAGNOSIS — M79601 Pain in right arm: Secondary | ICD-10-CM | POA: Diagnosis not present

## 2018-06-30 ENCOUNTER — Other Ambulatory Visit: Payer: Self-pay | Admitting: Gastroenterology

## 2018-07-02 ENCOUNTER — Other Ambulatory Visit: Payer: Self-pay | Admitting: Internal Medicine

## 2018-07-07 ENCOUNTER — Other Ambulatory Visit: Payer: Self-pay

## 2018-07-07 MED ORDER — BUDESONIDE 3 MG PO CPEP: 9.0000 mg | ORAL_CAPSULE | ORAL | 3 refills | Status: DC

## 2018-07-25 ENCOUNTER — Other Ambulatory Visit: Payer: Self-pay | Admitting: Internal Medicine

## 2018-07-26 ENCOUNTER — Encounter: Payer: Self-pay | Admitting: Internal Medicine

## 2018-07-26 ENCOUNTER — Ambulatory Visit (INDEPENDENT_AMBULATORY_CARE_PROVIDER_SITE_OTHER): Payer: Medicare Other | Admitting: Internal Medicine

## 2018-07-26 VITALS — BP 126/62 | HR 75 | Temp 98.0°F | Resp 15 | Ht 67.0 in | Wt 333.0 lb

## 2018-07-26 DIAGNOSIS — Z1231 Encounter for screening mammogram for malignant neoplasm of breast: Secondary | ICD-10-CM | POA: Diagnosis not present

## 2018-07-26 DIAGNOSIS — I10 Essential (primary) hypertension: Secondary | ICD-10-CM

## 2018-07-26 DIAGNOSIS — K529 Noninfective gastroenteritis and colitis, unspecified: Secondary | ICD-10-CM | POA: Diagnosis not present

## 2018-07-26 DIAGNOSIS — E119 Type 2 diabetes mellitus without complications: Secondary | ICD-10-CM

## 2018-07-26 DIAGNOSIS — K591 Functional diarrhea: Secondary | ICD-10-CM | POA: Diagnosis not present

## 2018-07-26 DIAGNOSIS — Z1239 Encounter for other screening for malignant neoplasm of breast: Secondary | ICD-10-CM

## 2018-07-26 LAB — COMPREHENSIVE METABOLIC PANEL
ALT: 22 U/L (ref 0–35)
AST: 17 U/L (ref 0–37)
Albumin: 4.2 g/dL (ref 3.5–5.2)
Alkaline Phosphatase: 60 U/L (ref 39–117)
BUN: 15 mg/dL (ref 6–23)
CALCIUM: 10.6 mg/dL — AB (ref 8.4–10.5)
CHLORIDE: 98 meq/L (ref 96–112)
CO2: 33 meq/L — AB (ref 19–32)
CREATININE: 0.83 mg/dL (ref 0.40–1.20)
GFR: 72.84 mL/min (ref >60.00)
Glucose, Bld: 154 mg/dL — ABNORMAL HIGH (ref 70–99)
Potassium: 4 mEq/L (ref 3.5–5.1)
Sodium: 137 mEq/L (ref 135–145)
Total Bilirubin: 0.8 mg/dL (ref 0.2–1.2)
Total Protein: 7.2 g/dL (ref 6.0–8.3)

## 2018-07-26 LAB — MAGNESIUM: Magnesium: 1.8 mg/dL (ref 1.5–2.5)

## 2018-07-26 LAB — HEMOGLOBIN A1C: HEMOGLOBIN A1C: 7.9 % — AB (ref 4.6–6.5)

## 2018-07-26 MED ORDER — PROMETHAZINE HCL 25 MG PO TABS: ORAL_TABLET | ORAL | 0 refills | Status: DC

## 2018-07-26 NOTE — Progress Notes (Signed)
Subjective:  Patient ID: Katie Huber, female    DOB: [birth year]  Age: 67 y.o. MRN: [id number]  CC: The primary encounter diagnosis was Diabetes mellitus without complication ([place]). Diagnoses of Chronic diarrhea, Breast cancer screening, Diarrhea, functional, and Essential hypertension, benign were also pertinent to this visit.  HPI Katie Huber presents for 6 month follow up on diabetes.  Patient has  A chief complaint today  of diarrhea.  HX: ,  For the  past 2 years year has had  a soft BM every time she eats.  Tried probiotics.  Has gotten worse,  Now having incontinence of liquid stools.  Also having crampy,  Abdominal pain ,and mild nausea.  Did not mention the diarrhea to Dr Allen Norris,  So biopsies weren't done at time of screening colonoscopy.  She Has tried her on Immodium   Up to 10 mg  Daily , followed by Viberzi,  Which was ineffective,  now taking budesonide plus  Immodium  for the last 4 months (budesonide dose is 9 mg  once daily), with no  change still having at least 3 stools daily,  first one soft, next two watery and uncontrolled.  Better later in the day.  Can eat lunch or dinner occasionally withou an accident.  Denies any  blood in stools .  No weight loss  Nausea improves with eating, phenergan helps.  Taking metformin    Patient is following a low glycemic index diet and taking all prescribed medications regularly without side effects. Random sugars have been under less than 160 most of the time and post prandials have been under  170  except on rare occasions. Patient is not exercising  Or  trying to lose weight .  Patient has had an eye exam in the last 12 months and checks feet regularly for signs of infection.  Patient does not walk barefoot outside,  And denies an numbness tingling or burning in feet. Patient is up to date on all recommended vaccinations  Bilateral arm pain Neurology eval at [facility].  B12 and Vitamin D were LOW t LOW   Nov ov 2018 , esr  normal .  Intrinsic factor ab negative.   Takes metformin    and omeprazole       Lab Results  Component Value Date   HGBA1C 7.9 (H) 07/26/2018     Outpatient Medications Prior to Visit  Medication Sig Dispense Refill  . acyclovir (ZOVIRAX) 400 MG tablet TAKE ONE TABLET BY MOUTH  FIVE TIMES DAILY 180 tablet 1  . albuterol (PROVENTIL HFA;VENTOLIN HFA) 108 (90 Base) MCG/ACT inhaler Inhale 2 puffs into the lungs every 6 (six) hours as needed for wheezing or shortness of breath. 1 Inhaler 0  . atorvastatin (LIPITOR) 20 MG tablet TAKE 1 TABLET BY MOUTH  DAILY 90 tablet 1  . budesonide (ENTOCORT EC) 3 MG 24 hr capsule Take 3 capsules (9 mg total) by mouth every morning. 90 capsule 3  . cyanocobalamin (,VITAMIN B-12,) 1000 MCG/ML injection 1 ml injected IM weekly x 4, then monthly thereafter 10 mL 0  . gabapentin (NEURONTIN) 300 MG capsule Take 2 capsules (600 mg total) by mouth 3 (three) times daily. 180 capsule 1  . glucose blood test strip OneTouch Verio Fex.    Test one time daily for steroid induced diabetes 100 each 0  . losartan (COZAAR) 100 MG tablet TAKE 1 TABLET BY MOUTH  DAILY 90 tablet 1  . montelukast (SINGULAIR) 10 MG tablet TAKE 1  TABLET BY MOUTH AT  BEDTIME 90 tablet 1  . omeprazole (PRILOSEC) 20 MG capsule TAKE 1 CAPSULE BY MOUTH TWO TIMES DAILY 180 capsule 1  . oseltamivir (TAMIFLU) 75 MG capsule Take 1 capsule (75 mg total) by mouth daily. 10 capsule 0  . PARoxetine (PAXIL) 20 MG tablet TAKE 1 TABLET BY MOUTH  EVERY MORNING 90 tablet 1  . potassium chloride SA (K-DUR,KLOR-CON) 20 MEQ tablet TAKE 1 TABLET BY MOUTH  DAILY WHEN USING FUROSEMIDE 90 tablet 1  . predniSONE (DELTASONE) 10 MG tablet 6 tablets on Day 1 , then reduce by 1 tablet daily until gone 21 tablet 0  . Syringe/Needle, Disp, (SYRINGE 3CC/25GX1") 25G X 1" 3 ML MISC Use for b12 injections 50 each 0  . triamterene-hydrochlorothiazide (MAXZIDE-25) 37.5-25 MG tablet TAKE 1 TABLET BY MOUTH  DAILY 90 tablet 1  .  WIXELA INHUB 250-50 MCG/DOSE AEPB USE 1 INHALATION INTO THE  LUNGS TWO TIMES DAILY 180 each 1  . furosemide (LASIX) 20 MG tablet TAKE 1 TABLET BY MOUTH  DAILY AS NEEDED FOR FLUID  RETENTION 90 tablet 1  . metFORMIN (GLUCOPHAGE) 1000 MG tablet TAKE 1 TABLET BY MOUTH TWO  TIMES DAILY WITH A MEAL 180 tablet 1  . promethazine (PHENERGAN) 25 MG tablet Take 1 tablet (25 mg total) by mouth every 8 (eight) hours as needed for nausea or vomiting. 90 tablet 0  . promethazine (PHENERGAN) 25 MG tablet TAKE 1 TABLET BY MOUTH  EVERY 8 HOURS AS NEEDED FOR NAUSEA AND VOMITING 90 tablet 0  . zolpidem (AMBIEN) 10 MG tablet Take 1 tablet (10 mg total) by mouth at bedtime as needed for sleep. 30 tablet 2   Facility-Administered Medications Prior to Visit  Medication Dose Route Frequency Provider Last Rate Last Dose  . pneumococcal 13-valent conjugate vaccine (PREVNAR 13) injection 0.5 mL  0.5 mL Intramuscular Once Katie Mc, MD        Review of Systems;  Patient denies headache, fevers, malaise, unintentional weight loss, skin rash, eye pain, sinus congestion and sinus pain, sore throat, dysphagia,  hemoptysis , cough, dyspnea, wheezing, chest pain, palpitations, orthopnea, edema, abdominal pain, nausea, melena, diarrhea, constipation, flank pain, dysuria, hematuria, urinary  Frequency, nocturia, numbness, tingling, seizures,  Focal weakness, Loss of consciousness,  Tremor, insomnia, depression, anxiety, and suicidal ideation.      Objective:  BP 126/62 (BP Location: Left Arm, Patient Position: Sitting, Cuff Size: Large)   Pulse 75   Temp 98 F (36.7 C) (Oral)   Resp 15   Ht '5\' 7"'  (1.702 m)   Wt (!) 333 lb (151 kg)   SpO2 95%   BMI 52.16 kg/m   BP Readings from Last 3 Encounters:  07/26/18 126/62  04/08/18 (!) 149/68  03/30/18 (!) 129/48    Wt Readings from Last 3 Encounters:  07/26/18 (!) 333 lb (151 kg)  04/08/18 (!) 326 lb (147.9 kg)  03/30/18 (!) 326 lb (147.9 kg)    General  appearance: alert, cooperative and appears stated age Ears: normal TM's and external ear canals both ears Throat: lips, mucosa, and tongue normal; teeth and gums normal Neck: no adenopathy, no carotid bruit, supple, symmetrical, trachea midline and thyroid not enlarged, symmetric, no tenderness/mass/nodules Back: symmetric, no curvature. ROM normal. No CVA tenderness. Lungs: clear to auscultation bilaterally Heart: regular rate and rhythm, S1, S2 normal, no murmur, click, rub or gallop Abdomen: soft, non-tender; bowel sounds normal; no masses,  no organomegaly Pulses: 2+ and symmetric Skin: Skin  color, texture, turgor normal. No rashes or lesions Lymph nodes: Cervical, supraclavicular, and axillary nodes normal.  Lab Results  Component Value Date   HGBA1C 7.9 (H) 07/26/2018   HGBA1C 6.6 (H) 01/25/2018   HGBA1C 6.1 09/09/2017    Lab Results  Component Value Date   CREATININE 0.83 07/26/2018   CREATININE 0.61 01/25/2018   CREATININE 0.61 09/09/2017    Lab Results  Component Value Date   WBC 5.7 03/09/2017   HGB 13.0 03/09/2017   HCT 38.6 03/09/2017   PLT 196 03/09/2017   GLUCOSE 154 (H) 07/26/2018   CHOL 132 01/25/2018   TRIG 94.0 01/25/2018   HDL 55.90 01/25/2018   LDLDIRECT 69.0 06/05/2017   LDLCALC 57 01/25/2018   ALT 22 07/26/2018   AST 17 07/26/2018   NA 137 07/26/2018   K 4.0 07/26/2018   CL 98 07/26/2018   CREATININE 0.83 07/26/2018   BUN 15 07/26/2018   CO2 33 (H) 07/26/2018   TSH 1.08 06/08/2014   HGBA1C 7.9 (H) 07/26/2018   MICROALBUR 0.7 09/09/2017    No results found.  Assessment & Plan:   Problem List Items Addressed This Visit    Diabetes mellitus without complication ([place]) - Primary    Current  Loss of control noted . She will need to start alternative therapy since metformin is being suspended for persistent diarrhea.  Januvia recommended.  Patient is reminded to schedule an annual eye exam and foot exam is normal today. Patient has no history of  microalbuminuria. Patient is tolerating statin therapy for CAD risk reduction and on ACE/ARB for renal protection and hypertension   Lab Results  Component Value Date   HGBA1C 7.9 (H) 07/26/2018   Lab Results  Component Value Date   MICROALBUR 0.7 09/09/2017         Relevant Medications   sitaGLIPtin (JANUVIA) 50 MG tablet   Other Relevant Orders   Hemoglobin A1c (Completed)   Diarrhea, functional    I suspect the metformin as the cause since treatment for microscopic colitis with budesonide has not changed at all.  Suspending metformin, with plans to wean budesonide if diarrhea resolves. If not,  A second opinion would be considered       Essential hypertension, benign    Well controlled on current regimen. Renal function stable, no changes today.  Lab Results  Component Value Date   CREATININE 0.83 07/26/2018   Lab Results  Component Value Date   NA 137 07/26/2018   K 4.0 07/26/2018   CL 98 07/26/2018   CO2 33 (H) 07/26/2018          Other Visit Diagnoses    Chronic diarrhea       Relevant Orders   Comprehensive metabolic panel (Completed)   Magnesium (Completed)   Breast cancer screening       Relevant Orders   MM 3D SCREEN BREAST BILATERAL    A total of 25 minutes of face to face time was spent with patient more than half of which was spent in counselling about the above mentioned conditions  and coordination of care   I have discontinued Lynnae Hur's metFORMIN. I am also having her start on sitaGLIPtin. Additionally, I am having her maintain her glucose blood, acyclovir, potassium chloride SA, predniSONE, oseltamivir, gabapentin, cyanocobalamin, SYRINGE 3CC/25GX1", albuterol, PARoxetine, omeprazole, atorvastatin, losartan, montelukast, zolpidem, triamterene-hydrochlorothiazide, WIXELA INHUB, budesonide, and promethazine. We will continue to administer pneumococcal 13-valent conjugate vaccine.  Meds ordered this encounter  Medications  . promethazine  (  PHENERGAN) 25 MG tablet    Sig: TAKE 1 TABLET BY MOUTH  EVERY 8 HOURS AS NEEDED FOR NAUSEA AND VOMITING    Dispense:  90 tablet    Refill:  0  . sitaGLIPtin (JANUVIA) 50 MG tablet    Sig: Take 1 tablet (50 mg total) by mouth daily.    Dispense:  30 tablet    Refill:  5    Medications Discontinued During This Encounter  Medication Reason  . metFORMIN (GLUCOPHAGE) 1000 MG tablet   . promethazine (PHENERGAN) 25 MG tablet   . promethazine (PHENERGAN) 25 MG tablet     Follow-up: No follow-ups on file.   Katie Mc, MD

## 2018-07-26 NOTE — Assessment & Plan Note (Addendum)
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  Suspending metformin, with plans to wean budesonide if diarrhea resolves. If not,  A second opinion would be considered

## 2018-07-26 NOTE — Patient Instructions (Addendum)
STOP THE METFORMIN . IF THE DIARRHEA STOPS WE HAVE OUR ANSWER   If the diarrhea stops,  Then stop the budesonide ,Gradually by reducing the dose  by 1 tablet every week    Your mammogram has been ordered.  You must call to make your appointment at [facility] because we are no longer allowed to schedule

## 2018-07-27 ENCOUNTER — Telehealth: Payer: Self-pay | Admitting: Internal Medicine

## 2018-07-27 MED ORDER — SITAGLIPTIN PHOSPHATE 50 MG PO TABS: 50.0000 mg | ORAL_TABLET | Freq: Every day | ORAL | 5 refills | Status: DC

## 2018-07-27 NOTE — Telephone Encounter (Signed)
Copied from [place] [phone]. Topic: Quick Communication - See Telephone Encounter >> Jul 27, 2018 10:18 AM Ahmed Prima L wrote: CRM for notification. See Telephone encounter for: 07/27/18.  Patient needs a prior autho on her promethazine (PHENERGAN) 25 MG tablet per insurance. It needs to be a tier 2 and not a tier 3. The number is [phone]. Thanks!

## 2018-07-27 NOTE — Addendum Note (Signed)
Addended by: [name] on: 07/27/2018 12:12 PM   Modules accepted: Orders

## 2018-07-27 NOTE — Assessment & Plan Note (Signed)
Current  Loss of control noted . She will need to start alternative therapy since metformin is being suspended for persistent diarrhea.  Januvia recommended.  Patient is reminded to schedule an annual eye exam and foot exam is normal today. Patient has no history of microalbuminuria. Patient is tolerating statin therapy for CAD risk reduction and on ACE/ARB for renal protection and hypertension   Lab Results  Component Value Date   HGBA1C 7.9 (H) 07/26/2018   Lab Results  Component Value Date   MICROALBUR 0.7 09/09/2017

## 2018-07-27 NOTE — Telephone Encounter (Signed)
Prior auth required

## 2018-07-27 NOTE — Assessment & Plan Note (Signed)
Well controlled on current regimen. Renal function stable, no changes today.  Lab Results  Component Value Date   CREATININE 0.83 07/26/2018   Lab Results  Component Value Date   NA 137 07/26/2018   K 4.0 07/26/2018   CL 98 07/26/2018   CO2 33 (H) 07/26/2018

## 2018-07-30 ENCOUNTER — Telehealth: Payer: Self-pay | Admitting: Internal Medicine

## 2018-07-30 MED ORDER — SITAGLIPTIN PHOSPHATE 100 MG PO TABS: 100.0000 mg | ORAL_TABLET | Freq: Every day | ORAL | 5 refills | Status: DC

## 2018-07-30 NOTE — Telephone Encounter (Signed)
Copied from [place] [id number]. Topic: Quick Communication - Rx Refill/Question >> Jul 30, 2018 12:34 PM Katie Huber wrote: Medication: sitaGLIPtin (JANUVIA) 50 MG tablet [[id number]]   Pt called Huber/c the medication above is too expensive; but the pt found out if she can get the 100mg  tablets w/ the instructions of taking 1/2 a tablet it will be more economic for her; contact pt if needed to advise

## 2018-07-30 NOTE — Telephone Encounter (Signed)
Januvia changed to 100 mg and [facility]  Start with 1/2 tablet daily

## 2018-07-30 NOTE — Telephone Encounter (Signed)
rx question 

## 2018-08-03 NOTE — Telephone Encounter (Signed)
Patient is aware that rx was sent in

## 2018-08-03 NOTE — Telephone Encounter (Signed)
Patient was asking about status of this when I spoke with her this am

## 2018-08-24 ENCOUNTER — Ambulatory Visit
Admission: RE | Admit: 2018-08-24 | Discharge: 2018-08-24 | Disposition: A | Discharge status: Home / Self Care | Payer: Medicare Other | Source: Ambulatory Visit | Attending: Internal Medicine | Admitting: Internal Medicine

## 2018-08-24 DIAGNOSIS — Z1231 Encounter for screening mammogram for malignant neoplasm of breast: Secondary | ICD-10-CM | POA: Diagnosis not present

## 2018-08-24 DIAGNOSIS — Z1239 Encounter for other screening for malignant neoplasm of breast: Secondary | ICD-10-CM

## 2018-08-27 ENCOUNTER — Other Ambulatory Visit: Payer: Self-pay

## 2018-08-27 DIAGNOSIS — G4700 Insomnia, unspecified: Secondary | ICD-10-CM

## 2018-08-27 MED ORDER — ZOLPIDEM TARTRATE 10 MG PO TABS: 10.0000 mg | ORAL_TABLET | Freq: Every evening | ORAL | 0 refills | Status: DC | PRN

## 2018-08-27 NOTE — Telephone Encounter (Signed)
Called in phar zolpidem 10 mg t tab at bedtime 30 with no refills as per dfk

## 2018-08-31 NOTE — Telephone Encounter (Signed)
PA was completed and covermymeds stated that this medication is covered by pt's plan and PA is not required.   LMTCB. PEC may speak with pt.

## 2018-09-01 NOTE — Telephone Encounter (Signed)
Pt is aware no PA is needed for phenergan

## 2018-09-06 ENCOUNTER — Other Ambulatory Visit: Payer: Self-pay | Admitting: Internal Medicine

## 2018-09-15 ENCOUNTER — Ambulatory Visit: Payer: Medicare Other

## 2018-09-21 ENCOUNTER — Ambulatory Visit: Payer: Self-pay | Admitting: Internal Medicine

## 2018-09-28 ENCOUNTER — Telehealth: Payer: Self-pay | Admitting: Internal Medicine

## 2018-09-28 ENCOUNTER — Encounter: Payer: Self-pay | Admitting: Internal Medicine

## 2018-09-28 ENCOUNTER — Other Ambulatory Visit: Payer: Self-pay

## 2018-09-28 ENCOUNTER — Ambulatory Visit: Payer: Medicare Other | Admitting: Internal Medicine

## 2018-09-28 VITALS — BP 165/92 | HR 67 | Resp 16 | Ht 67.0 in | Wt 335.0 lb

## 2018-09-28 DIAGNOSIS — G4700 Insomnia, unspecified: Secondary | ICD-10-CM | POA: Diagnosis not present

## 2018-09-28 DIAGNOSIS — I2721 Secondary pulmonary arterial hypertension: Secondary | ICD-10-CM

## 2018-09-28 DIAGNOSIS — R0602 Shortness of breath: Secondary | ICD-10-CM | POA: Diagnosis not present

## 2018-09-28 DIAGNOSIS — G4733 Obstructive sleep apnea (adult) (pediatric): Secondary | ICD-10-CM

## 2018-09-28 DIAGNOSIS — Z9989 Dependence on other enabling machines and devices: Secondary | ICD-10-CM

## 2018-09-28 MED ORDER — ZOLPIDEM TARTRATE 10 MG PO TABS: 10.0000 mg | ORAL_TABLET | Freq: Every evening | ORAL | 0 refills | Status: DC | PRN

## 2018-09-28 NOTE — Telephone Encounter (Signed)
Pt dropped off medication form to be filled out located in medication management clinic folder in front office.
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
## 2018-09-28 NOTE — Patient Instructions (Signed)

## 2018-09-28 NOTE — Patient Outreach (Signed)
[facility] [facility]) Care Management  09/28/2018  Katie Huber 1951-02-06 [id number]   Medication Adherence call to Mrs. Katie Huber left a message for patient to call back patient is due on Losartan 100 mg and Januvia 100 mg Katie Huber is showing past due under [place].   [facility] Management Direct Dial [phone]  Fax [phone] [email or web address]

## 2018-10-05 MED ORDER — SITAGLIPTIN PHOSPHATE 50 MG PO TABS: 50.0000 mg | ORAL_TABLET | Freq: Every day | ORAL | 1 refills | Status: DC

## 2018-10-05 NOTE — Telephone Encounter (Signed)
sitagliptan refilled via [facility]

## 2018-10-05 NOTE — Telephone Encounter (Signed)
Form has been placed in red folder.  

## 2018-10-06 ENCOUNTER — Other Ambulatory Visit: Payer: Self-pay | Admitting: Internal Medicine

## 2018-10-06 DIAGNOSIS — G4700 Insomnia, unspecified: Secondary | ICD-10-CM

## 2018-10-06 NOTE — Telephone Encounter (Signed)
Pt was seen by you on 09/28/18 I think you try to send its showing phone in do mind send again with refills for her next in may

## 2018-10-07 NOTE — Telephone Encounter (Signed)
Form has been completed and mailed

## 2018-10-22 ENCOUNTER — Other Ambulatory Visit: Payer: Self-pay | Admitting: Internal Medicine

## 2018-11-06 ENCOUNTER — Other Ambulatory Visit: Payer: Self-pay | Admitting: Internal Medicine

## 2018-11-25 IMAGING — MG MM DIGITAL SCREENING BILAT W/ TOMO W/ CAD
8 of 15 series · 8 of 40 positions shown · non-contrast
Comparison: Previous exam(s).

ACR Breast Density Category a: The breast tissue is almost entirely
fatty.

CLINICAL DATA: Screening.

EXAM:
DIGITAL SCREENING BILATERAL MAMMOGRAM WITH TOMO AND CAD

[L CC synth-2D]
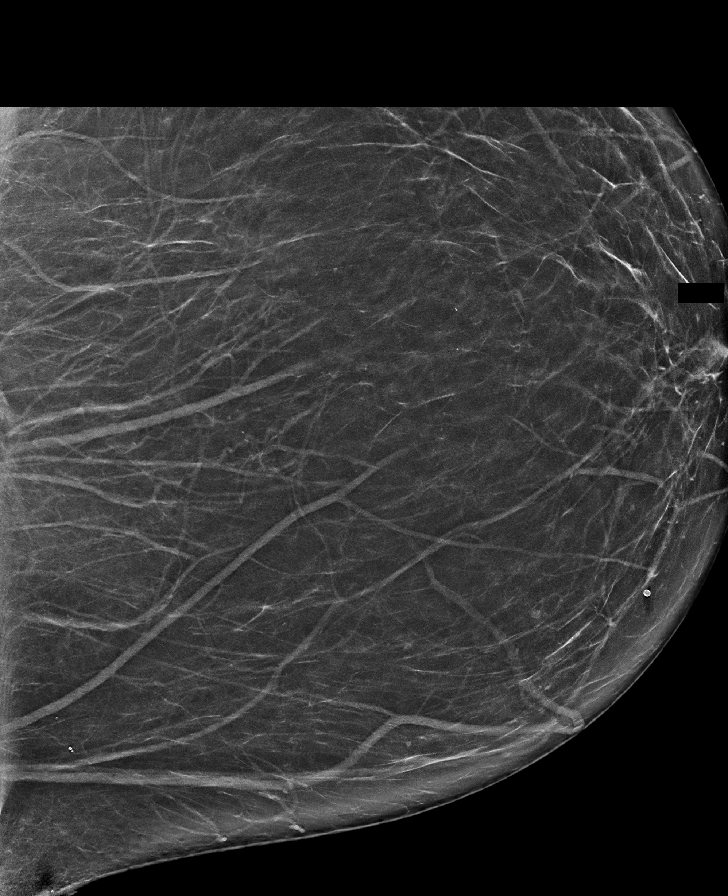

[R MLO synth-2D (1 of 2)]
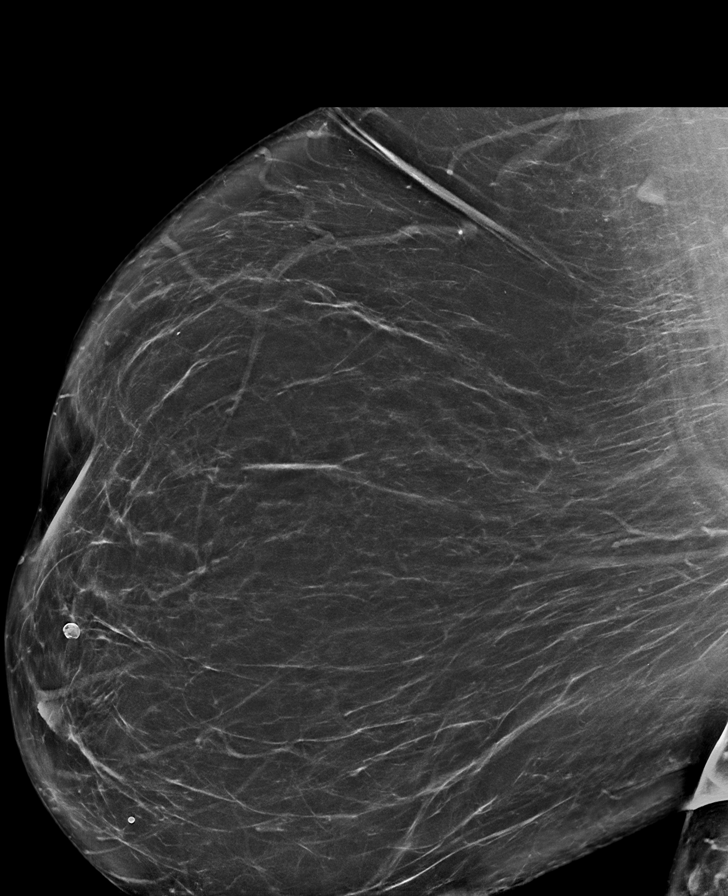

[R MLO synth-2D (2 of 2)]
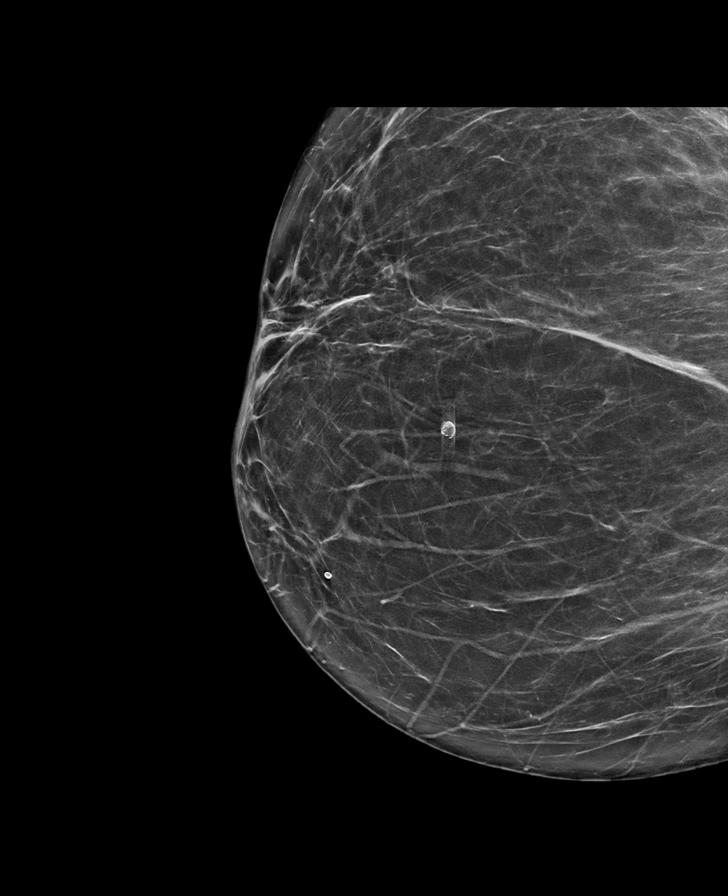

[L MLO synth-2D (1 of 2)]
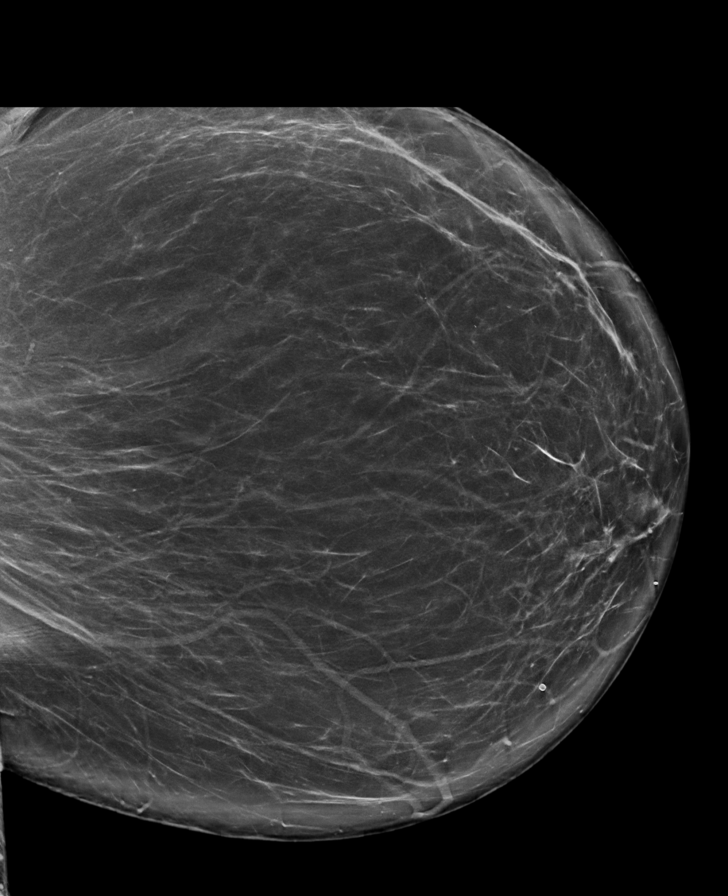

[L MLO synth-2D (2 of 2)]
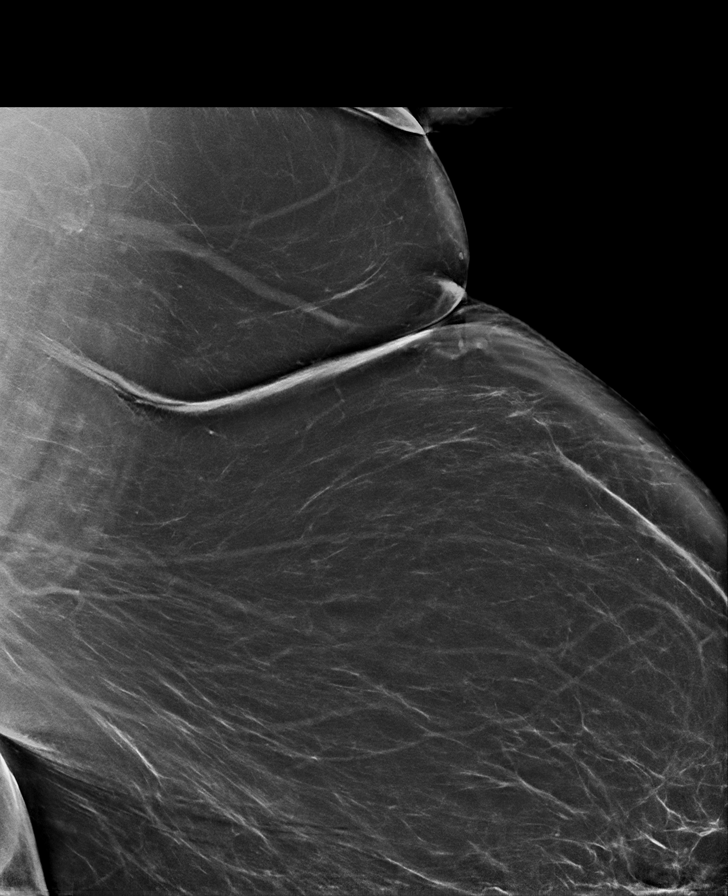

[R CC synth-2D (1 of 2)]
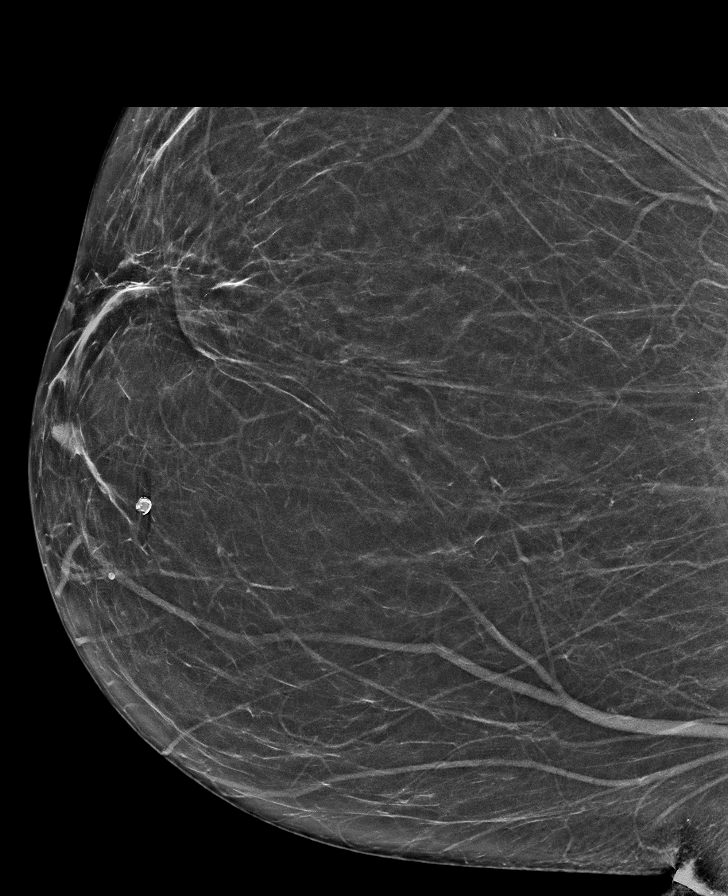

[R CC synth-2D (2 of 2)]
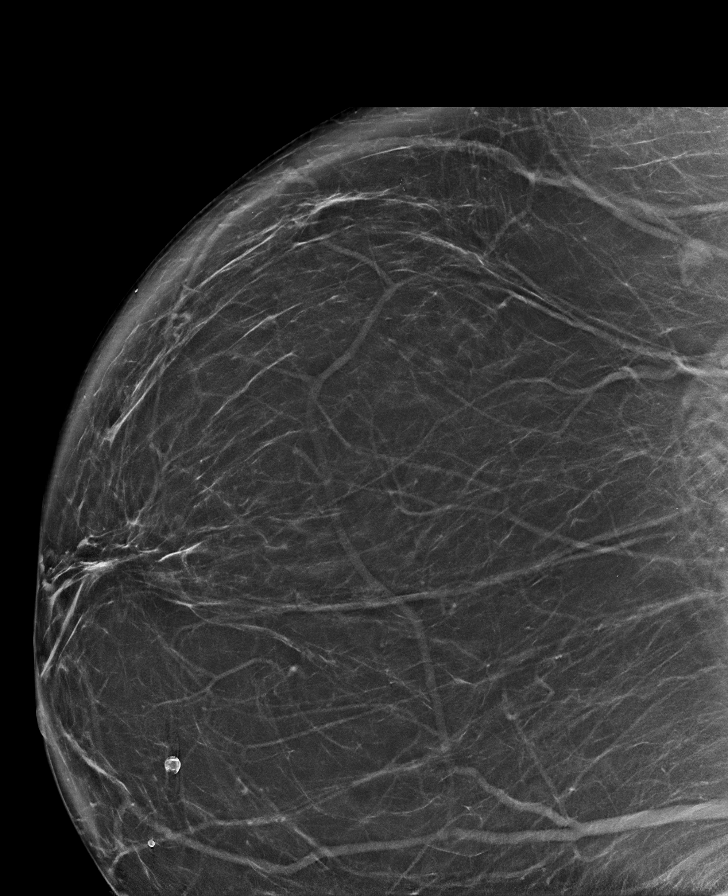

[R CC tomo · tomo slice 37/73.0]
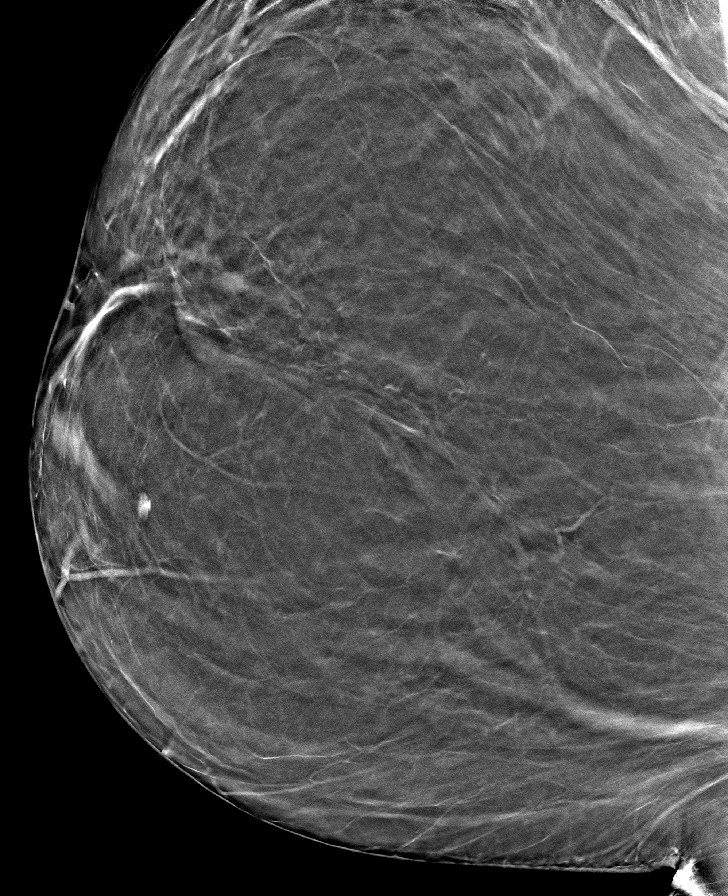

[8 of 40 positions shown; findings below may reference images not displayed]

FINDINGS: There are no findings suspicious for malignancy. Images were
processed with CAD.
IMPRESSION: No mammographic evidence of malignancy. A result letter of this
screening mammogram will be mailed directly to the patient.

RECOMMENDATION:
Screening mammogram in one year. (Code:8Y-Q-VVS)

BI-RADS CATEGORY  1: Negative.

## 2019-01-04 DIAGNOSIS — G4733 Obstructive sleep apnea (adult) (pediatric): Secondary | ICD-10-CM | POA: Diagnosis not present

## 2019-01-10 ENCOUNTER — Telehealth: Payer: Self-pay

## 2019-01-10 NOTE — Telephone Encounter (Signed)
Copied from [place] [id number]. Topic: Appointment Scheduling - Scheduling Inquiry for Clinic >> Jan 10, 2019  9:08 AM Lennox Solders wrote: Reason for CRM:pt has an appt with dr Derrel Nip on 01-28-2019 and pt would like labs prior to her appt.

## 2019-01-10 NOTE — Telephone Encounter (Signed)
Spoke with pt and scheduled her a fasting lab appt for 01/21/2019 at 9:45am. Pt is aware of appt date and time.

## 2019-01-19 ENCOUNTER — Ambulatory Visit: Payer: Medicare Other | Admitting: Adult Health

## 2019-01-19 VITALS — BP 156/78 | HR 85 | Temp 98.8°F | Resp 18 | Ht 67.0 in | Wt 319.0 lb

## 2019-01-19 DIAGNOSIS — R059 Cough, unspecified: Secondary | ICD-10-CM

## 2019-01-19 DIAGNOSIS — J4550 Severe persistent asthma, uncomplicated: Secondary | ICD-10-CM

## 2019-01-19 DIAGNOSIS — R05 Cough: Secondary | ICD-10-CM

## 2019-01-19 DIAGNOSIS — J988 Other specified respiratory disorders: Secondary | ICD-10-CM

## 2019-01-19 MED ORDER — LEVOFLOXACIN 500 MG PO TABS: 500.0000 mg | ORAL_TABLET | Freq: Every day | ORAL | 0 refills | Status: DC

## 2019-01-19 MED ORDER — ALBUTEROL SULFATE (2.5 MG/3ML) 0.083% IN NEBU
2.5000 mg | INHALATION_SOLUTION | Freq: Once | RESPIRATORY_TRACT | Status: AC
  Administered 2019-01-19: 2.5 mg via RESPIRATORY_TRACT

## 2019-01-19 MED ORDER — ALBUTEROL SULFATE HFA 108 (90 BASE) MCG/ACT IN AERS: 2.0000 | INHALATION_SPRAY | Freq: Four times a day (QID) | RESPIRATORY_TRACT | 1 refills | Status: DC | PRN

## 2019-01-19 MED ORDER — ALBUTEROL SULFATE (2.5 MG/3ML) 0.083% IN NEBU: 2.5000 mg | INHALATION_SOLUTION | Freq: Four times a day (QID) | RESPIRATORY_TRACT | 1 refills | Status: AC | PRN

## 2019-01-19 MED ORDER — PREDNISONE 10 MG PO TABS: ORAL_TABLET | ORAL | 0 refills | Status: DC

## 2019-01-19 MED ORDER — HYDROCOD POLST-CPM POLST ER 10-8 MG/5ML PO SUER: 5.0000 mL | Freq: Two times a day (BID) | ORAL | 0 refills | Status: DC
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
## 2019-01-21 ENCOUNTER — Other Ambulatory Visit: Payer: Medicare Other

## 2019-01-21 DIAGNOSIS — J45909 Unspecified asthma, uncomplicated: Secondary | ICD-10-CM | POA: Diagnosis not present

## 2019-01-24 ENCOUNTER — Other Ambulatory Visit (INDEPENDENT_AMBULATORY_CARE_PROVIDER_SITE_OTHER): Payer: Medicare Other

## 2019-01-24 DIAGNOSIS — E119 Type 2 diabetes mellitus without complications: Secondary | ICD-10-CM | POA: Diagnosis not present

## 2019-01-24 LAB — COMPREHENSIVE METABOLIC PANEL
ALT: 38 U/L — ABNORMAL HIGH (ref 0–35)
AST: 24 U/L (ref 0–37)
Albumin: 4.1 g/dL (ref 3.5–5.2)
Alkaline Phosphatase: 102 U/L (ref 39–117)
BUN: 12 mg/dL (ref 6–23)
CO2: 31 mEq/L (ref 19–32)
Calcium: 9.7 mg/dL (ref 8.4–10.5)
Chloride: 100 mEq/L (ref 96–112)
Creatinine, Ser: 0.6 mg/dL (ref 0.40–1.20)
GFR: 99.51 mL/min (ref >60.00)
GLUCOSE: 139 mg/dL — AB (ref 70–99)
Potassium: 3.7 mEq/L (ref 3.5–5.1)
Sodium: 137 mEq/L (ref 135–145)
Total Bilirubin: 0.5 mg/dL (ref 0.2–1.2)
Total Protein: 6.9 g/dL (ref 6.0–8.3)

## 2019-01-24 LAB — LIPID PANEL
Cholesterol: 125 mg/dL (ref 0–200)
HDL: 60 mg/dL (ref >39.00)
LDL Cholesterol: 49 mg/dL (ref 0–99)
NONHDL: 64.75
Total CHOL/HDL Ratio: 2
Triglycerides: 79 mg/dL (ref 0.0–149.0)
VLDL: 15.8 mg/dL (ref 0.0–40.0)

## 2019-01-24 NOTE — Addendum Note (Signed)
Addended by: Leeanne Rio on: 01/24/2019 09:44 AM   Modules accepted: Orders

## 2019-01-24 NOTE — Addendum Note (Signed)
Addended by: Leeanne Rio on: 01/24/2019 10:40 AM   Modules accepted: Orders

## 2019-01-24 NOTE — Addendum Note (Signed)
Addended by: Leeanne Rio on: 01/24/2019 10:29 AM   Modules accepted: Orders

## 2019-01-28 ENCOUNTER — Encounter: Payer: Self-pay | Admitting: Internal Medicine

## 2019-01-28 ENCOUNTER — Ambulatory Visit (INDEPENDENT_AMBULATORY_CARE_PROVIDER_SITE_OTHER): Payer: Medicare Other | Admitting: Internal Medicine

## 2019-01-28 ENCOUNTER — Ambulatory Visit (INDEPENDENT_AMBULATORY_CARE_PROVIDER_SITE_OTHER): Payer: Medicare Other

## 2019-01-28 ENCOUNTER — Other Ambulatory Visit: Payer: Self-pay | Admitting: Internal Medicine

## 2019-01-28 VITALS — BP 118/64 | HR 70 | Temp 98.3°F | Resp 16 | Ht 66.0 in | Wt 319.0 lb

## 2019-01-28 DIAGNOSIS — E119 Type 2 diabetes mellitus without complications: Secondary | ICD-10-CM

## 2019-01-28 DIAGNOSIS — E785 Hyperlipidemia, unspecified: Secondary | ICD-10-CM | POA: Diagnosis not present

## 2019-01-28 DIAGNOSIS — Z0000 Encounter for general adult medical examination without abnormal findings: Secondary | ICD-10-CM | POA: Diagnosis not present

## 2019-01-28 DIAGNOSIS — I10 Essential (primary) hypertension: Secondary | ICD-10-CM | POA: Diagnosis not present

## 2019-01-28 DIAGNOSIS — E669 Obesity, unspecified: Secondary | ICD-10-CM | POA: Diagnosis not present

## 2019-01-28 DIAGNOSIS — E1169 Type 2 diabetes mellitus with other specified complication: Secondary | ICD-10-CM

## 2019-01-28 LAB — MICROALBUMIN / CREATININE URINE RATIO
Creatinine,U: 55.6 mg/dL
Microalb Creat Ratio: 1.3 mg/g (ref 0.0–30.0)
Microalb, Ur: <0.7 mg/dL (ref 0.0–1.9)

## 2019-01-28 LAB — POCT GLYCOSYLATED HEMOGLOBIN (HGB A1C): Hemoglobin A1C: 6.4 % — AB (ref 4.0–5.6)

## 2019-01-28 MED ORDER — HYDROCOD POLST-CPM POLST ER 10-8 MG/5ML PO SUER: 5.0000 mL | Freq: Every evening | ORAL | 0 refills | Status: DC | PRN

## 2019-01-28 MED ORDER — ZOSTER VAC RECOMB ADJUVANTED 50 MCG/0.5ML IM SUSR: 0.5000 mL | Freq: Once | INTRAMUSCULAR | 1 refills | Status: AC

## 2019-01-28 NOTE — Progress Notes (Signed)
Subjective:  Patient ID: Katie Huber, female    DOB: [birth year]  Age: 68 y.o. MRN: [id number]  CC: The primary encounter diagnosis was Type 2 diabetes mellitus with obesity ([place]). Diagnoses of Obesity, Class III, BMI 40-49.9 (morbid obesity) ([place]), Hyperlipidemia associated with type 2 diabetes mellitus ([place]), and Essential hypertension, benign were also pertinent to this visit.  HPI Katie Huber presents for 6 month follow up on diabetes.  Patient has no complaints today.  Patient is following a low glycemic index diet and taking all prescribed medications regularly including Januvia 50 mg daily without side effects.  Fasting sugars have been under less than 140 most of the time and post prandials have been under 160 except on rare occasions. Patient is not exercising but intentionally trying to lose weight .  She reports that she had gained 30 lbs since her last visit but has lost 30  lbs recently by reducing her carb intake Patient has had an eye exam in the last 12 months and checks feet regularly for signs of infection.  Patient does not walk barefoot outside,  And denies an numbness tingling or burning in feet. Patient is up to date on all recommended vaccinations except flu  Treated for asthma exacerbation on Feb 19. Symptoms were present for 3 weeks  Was treated by Humphrey Rolls with improvement but continues to have nighttime cough that is keeping her awake   Outpatient Medications Prior to Visit  Medication Sig Dispense Refill  . acyclovir (ZOVIRAX) 400 MG tablet TAKE ONE TABLET BY MOUTH  FIVE TIMES DAILY 180 tablet 1  . albuterol (PROVENTIL HFA;VENTOLIN HFA) 108 (90 Base) MCG/ACT inhaler Inhale 2 puffs into the lungs every 6 (six) hours as needed for wheezing or shortness of breath. 3 Inhaler 1  . albuterol (PROVENTIL) (2.5 MG/3ML) 0.083% nebulizer solution Take 3 mLs (2.5 mg total) by nebulization every 6 (six) hours as needed for wheezing or shortness of breath. 150 mL 1  . atorvastatin  (LIPITOR) 20 MG tablet TAKE 1 TABLET BY MOUTH  DAILY 90 tablet 1  . furosemide (LASIX) 20 MG tablet TAKE 1 TABLET BY MOUTH  DAILY AS NEEDED FOR FLUID  RETENTION 90 tablet 1  . gabapentin (NEURONTIN) 300 MG capsule Take 2 capsules (600 mg total) by mouth 3 (three) times daily. (Patient taking differently: Take 300 mg by mouth 3 (three) times daily. ) 180 capsule 1  . glucose blood test strip OneTouch Verio Fex.    Test one time daily for steroid induced diabetes 100 each 0  . losartan (COZAAR) 100 MG tablet TAKE 1 TABLET BY MOUTH  DAILY 90 tablet 1  . montelukast (SINGULAIR) 10 MG tablet TAKE 1 TABLET BY MOUTH AT  BEDTIME 90 tablet 1  . omeprazole (PRILOSEC) 20 MG capsule TAKE 1 CAPSULE BY MOUTH TWO TIMES DAILY 180 capsule 1  . PARoxetine (PAXIL) 20 MG tablet TAKE 1 TABLET BY MOUTH  EVERY MORNING 90 tablet 1  . potassium chloride SA (K-DUR,KLOR-CON) 20 MEQ tablet TAKE 1 TABLET BY MOUTH  DAILY WHEN USING FUROSEMIDE 90 tablet 1  . predniSONE (DELTASONE) 10 MG tablet Use per dose pack 21 tablet 0  . promethazine (PHENERGAN) 25 MG tablet TAKE 1 TABLET BY MOUTH  EVERY 8 HOURS AS NEEDED FOR NAUSEA AND VOMITING 90 tablet 0  . sitaGLIPtin (JANUVIA) 50 MG tablet Take 1 tablet (50 mg total) by mouth daily. 90 tablet 1  . Syringe/Needle, Disp, (SYRINGE 3CC/25GX1") 25G X 1" 3 ML MISC Use for  b12 injections 50 each 0  . triamterene-hydrochlorothiazide (MAXZIDE-25) 37.5-25 MG tablet TAKE 1 TABLET BY MOUTH  DAILY 90 tablet 1  . WIXELA INHUB 250-50 MCG/DOSE AEPB USE 1 INHALATION BY MOUTH  INTO THE LUNGS TWO TIMES  DAILY 180 each 1  . zolpidem (AMBIEN) 10 MG tablet TAKE ONE TABLET AT BEDTIME AS NEEDED FORSLEEP 30 tablet 5   Facility-Administered Medications Prior to Visit  Medication Dose Route Frequency Provider Last Rate Last Dose  . pneumococcal 13-valent conjugate vaccine (PREVNAR 13) injection 0.5 mL  0.5 mL Intramuscular Once [name], MD        Review of Systems;  Patient denies headache,  fevers, malaise, unintentional weight loss, skin rash, eye pain, sinus congestion and sinus pain, sore throat, dysphagia,  hemoptysis , cough, dyspnea, wheezing, chest pain, palpitations, orthopnea, edema, abdominal pain, nausea, melena, diarrhea, constipation, flank pain, dysuria, hematuria, urinary  Frequency, nocturia, numbness, tingling, seizures,  Focal weakness, Loss of consciousness,  Tremor, insomnia, depression, anxiety, and suicidal ideation.      Objective:  BP 118/64 (BP Location: Left Arm, Patient Position: Sitting, Cuff Size: Large)   Pulse 70   Temp 98.3 F (36.8 C) (Oral)   Resp 16   Ht 5\' 6"  (1.676 m)   Wt (!) 319 lb (144.7 kg)   SpO2 95%   BMI 51.49 kg/m   BP Readings from Last 3 Encounters:  01/28/19 118/64  01/28/19 118/64  01/19/19 (!) 156/78    Wt Readings from Last 3 Encounters:  01/28/19 (!) 319 lb (144.7 kg)  01/28/19 (!) 319 lb (144.7 kg)  01/19/19 (!) 319 lb (144.7 kg)    General appearance: alert, cooperative and appears stated age Ears: normal TM's and external ear canals both ears Throat: lips, mucosa, and tongue normal; teeth and gums normal Neck: no adenopathy, no carotid bruit, supple, symmetrical, trachea midline and thyroid not enlarged, symmetric, no tenderness/mass/nodules Back: symmetric, no curvature. ROM normal. No CVA tenderness. Lungs: clear to auscultation bilaterally Heart: regular rate and rhythm, S1, S2 normal, no murmur, click, rub or gallop Abdomen: soft, non-tender; bowel sounds normal; no masses,  no organomegaly Pulses: 2+ and symmetric Skin: Skin color, texture, turgor normal. No rashes or lesions Lymph nodes: Cervical, supraclavicular, and axillary nodes normal.  Lab Results  Component Value Date   HGBA1C 6.4 (A) 01/28/2019   HGBA1C 7.9 (H) 07/26/2018   HGBA1C 6.6 (H) 01/25/2018    Lab Results  Component Value Date   CREATININE 0.60 01/24/2019   CREATININE 0.83 07/26/2018   CREATININE 0.61 01/25/2018    Lab  Results  Component Value Date   WBC 5.7 03/09/2017   HGB 13.0 03/09/2017   HCT 38.6 03/09/2017   PLT 196 03/09/2017   GLUCOSE 139 (H) 01/24/2019   CHOL 125 01/24/2019   TRIG 79.0 01/24/2019   HDL 60.00 01/24/2019   LDLDIRECT 69.0 06/05/2017   LDLCALC 49 01/24/2019   ALT 38 (H) 01/24/2019   AST 24 01/24/2019   NA 137 01/24/2019   K 3.7 01/24/2019   CL 100 01/24/2019   CREATININE 0.60 01/24/2019   BUN 12 01/24/2019   CO2 31 01/24/2019   TSH 1.08 06/08/2014   HGBA1C 6.4 (A) 01/28/2019   MICROALBUR <0.7 01/28/2019    Mm 3d Screen Breast Bilateral  Result Date: 08/24/2018 CLINICAL DATA:  Screening. EXAM: DIGITAL SCREENING BILATERAL MAMMOGRAM WITH TOMO AND CAD COMPARISON:  Previous exam(s). ACR Breast Density Category a: The breast tissue is almost entirely fatty. FINDINGS: There are  no findings suspicious for malignancy. Images were processed with CAD. IMPRESSION: No mammographic evidence of malignancy. A result letter of this screening mammogram will be mailed directly to the patient. RECOMMENDATION: Screening mammogram in one year. (Code:SM-B-01Y) BI-RADS CATEGORY  1: Negative. Electronically Signed   By: Ammie Ferrier M.D.   On: 08/24/2018 16:47    Assessment & Plan:   Problem List Items Addressed This Visit    Type 2 diabetes mellitus with obesity ([place]) - Primary     Historically well-controlled on sitagliptan,  .  hemoglobin A1c has been consistently at or  less than 7.0 . Patient is up-to-date on eye exams and foot exam is normal today. Patient has no microalbuminuria. Patient is tolerating statin therapy for CAD risk reduction and on ACE/ARB for renal protection and hypertension   Lab Results  Component Value Date   HGBA1C 6.4 (A) 01/28/2019   Lab Results  Component Value Date   MICROALBUR <0.7 01/28/2019         Obesity, Class III, BMI 40-49.9 (morbid obesity) ([place])    I have congratulated her in reduction of   BMI and encouraged  Continued weight loss with goal  of 10% of body weigh over the next 6 months using a low glycemic index diet and regular exercise a minimum of 5 days per week.        Hyperlipidemia associated with type 2 diabetes mellitus ([place])    Based on untreated  lipid profile, the risk of clinically significant CAD is 17% over the next 10 years.  She has been taking Lipitor with good tolerance and excellent results.  No changes today Lab Results  Component Value Date   CHOL 125 01/24/2019   HDL 60.00 01/24/2019   LDLCALC 49 01/24/2019   LDLDIRECT 69.0 06/05/2017   TRIG 79.0 01/24/2019   CHOLHDL 2 01/24/2019   Lab Results  Component Value Date   ALT 38 (H) 01/24/2019   AST 24 01/24/2019   ALKPHOS 102 01/24/2019   BILITOT 0.5 01/24/2019         Essential hypertension, benign    Well controlled on current regimen of  maxzide and losartan Renal function stable, no changes today.  Lab Results  Component Value Date   CREATININE 0.60 01/24/2019   Lab Results  Component Value Date   NA 137 01/24/2019   K 3.7 01/24/2019   CL 100 01/24/2019   CO2 31 01/24/2019            I am having Elaura Revell start on chlorpheniramine-HYDROcodone and Zoster Vaccine Adjuvanted. I am also having her maintain her glucose blood, acyclovir, potassium chloride SA, gabapentin, SYRINGE 3CC/25GX1", furosemide, promethazine, PARoxetine, montelukast, sitaGLIPtin, zolpidem, losartan, omeprazole, triamterene-hydrochlorothiazide, albuterol, predniSONE, albuterol, atorvastatin, and WIXELA INHUB. We will continue to administer pneumococcal 13-valent conjugate vaccine.  Meds ordered this encounter  Medications  . chlorpheniramine-HYDROcodone (TUSSIONEX PENNKINETIC ER) 10-8 MG/5ML SUER    Sig: Take 5 mLs by mouth at bedtime as needed.    Dispense:  140 mL    Refill:  0  . Zoster Vaccine Adjuvanted [facility]) injection    Sig: Inject 0.5 mLs into the muscle once for 1 dose.    Dispense:  1 each    Refill:  1    There are no discontinued  medications.  Follow-up: No follow-ups on file.   [name], MD

## 2019-01-28 NOTE — Patient Instructions (Addendum)
  Katie Huber , Thank you for taking time to come for your Medicare Wellness Visit. I appreciate your ongoing commitment to your health goals. Please review the following plan we discussed and let me know if I can assist you in the future.   Follow up as needed.    Bring a copy of your [place] and/or Living Will to be scanned into chart.  Have a great day!  These are the goals we discussed: Goals      Patient Stated   . Increase physical activity (pt-stated)     Stay active       This is a list of the screening recommended for you and due dates:  Health Maintenance  Topic Date Due  . Complete foot exam   08/13/2016  . Flu Shot  07/01/2018  . Cologuard (Stool DNA test)  09/10/2018  . Eye exam for diabetics  10/05/2018  . Hemoglobin A1C  01/26/2019  . Mammogram  08/25/2019  . Tetanus Vaccine  09/21/2023  . DEXA scan (bone density measurement)  Completed  .  Hepatitis C: One time screening is recommended by Center for Disease Control  (CDC) for  adults born from [birth year] through 1965.   Completed  . Pneumonia vaccines  Completed

## 2019-01-28 NOTE — Progress Notes (Signed)
Pre visit review using our clinic review tool, if applicable. No additional management support is needed unless otherwise documented below in the visit note. 
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
## 2019-01-30 NOTE — Assessment & Plan Note (Signed)
I have congratulated her in reduction of   BMI and encouraged  Continued weight loss with goal of 10% of body weigh over the next 6 months using a low glycemic index diet and regular exercise a minimum of 5 days per week.    

## 2019-01-30 NOTE — Assessment & Plan Note (Signed)
Well controlled on current regimen of  maxzide and losartan Renal function stable, no changes today.  Lab Results  Component Value Date   CREATININE 0.60 01/24/2019   Lab Results  Component Value Date   NA 137 01/24/2019   K 3.7 01/24/2019   CL 100 01/24/2019   CO2 31 01/24/2019

## 2019-01-30 NOTE — Assessment & Plan Note (Signed)
Historically well-controlled on sitagliptan,  .  hemoglobin A1c has been consistently at or  less than 7.0 . Patient is up-to-date on eye exams and foot exam is normal today. Patient has no microalbuminuria. Patient is tolerating statin therapy for CAD risk reduction and on ACE/ARB for renal protection and hypertension   Lab Results  Component Value Date   HGBA1C 6.4 (A) 01/28/2019   Lab Results  Component Value Date   MICROALBUR <0.7 01/28/2019

## 2019-01-30 NOTE — Assessment & Plan Note (Signed)
Based on untreated  lipid profile, the risk of clinically significant CAD is 17% over the next 10 years.  She has been taking Lipitor with good tolerance and excellent results.  No changes today Lab Results  Component Value Date   CHOL 125 01/24/2019   HDL 60.00 01/24/2019   LDLCALC 49 01/24/2019   LDLDIRECT 69.0 06/05/2017   TRIG 79.0 01/24/2019   CHOLHDL 2 01/24/2019   Lab Results  Component Value Date   ALT 38 (H) 01/24/2019   AST 24 01/24/2019   ALKPHOS 102 01/24/2019   BILITOT 0.5 01/24/2019

## 2019-02-08 ENCOUNTER — Ambulatory Visit: Payer: Medicare Other

## 2019-02-09 ENCOUNTER — Ambulatory Visit (INDEPENDENT_AMBULATORY_CARE_PROVIDER_SITE_OTHER): Payer: Medicare Other

## 2019-02-09 ENCOUNTER — Other Ambulatory Visit: Payer: Self-pay

## 2019-02-09 DIAGNOSIS — Z23 Encounter for immunization: Secondary | ICD-10-CM | POA: Diagnosis not present

## 2019-02-09 NOTE — Progress Notes (Signed)
Katie Huber presents today for injection per MD orders. High Dose Influenza injection  administered IM in right Upper Arm. Administration without incident. Patient tolerated well.  Gae Bon, CMA

## 2019-02-10 ENCOUNTER — Other Ambulatory Visit: Payer: Self-pay | Admitting: Internal Medicine

## 2019-02-10 ENCOUNTER — Telehealth: Payer: Self-pay

## 2019-02-11 NOTE — Telephone Encounter (Signed)
Spoke with the pt informed her to take extra precaution washing hands often and not touching the face and to let us know if she need a letter for work.

## 2019-02-19 DIAGNOSIS — J45909 Unspecified asthma, uncomplicated: Secondary | ICD-10-CM | POA: Diagnosis not present

## 2019-03-09 ENCOUNTER — Other Ambulatory Visit: Payer: Self-pay

## 2019-03-09 NOTE — Patient Outreach (Signed)
[facility]) Care Management  03/09/2019  Katie Huber 1951/03/07 [id number]   Medication Adherence call to Mrs. Katie Huber spoke with patient she is due on Januvia 100 mg patient explain she is still taking 1 tablet daily and has not pick up the medication from the pharmacy but does not need any help at this time, patient was inform about Patient Assistance she said she does not qualify she makes too much money. Katie Huber is showing past due under [place].   [facility] Management Direct Dial [phone]  Fax [phone] [email or web address]

## 2019-03-10 ENCOUNTER — Other Ambulatory Visit: Payer: Self-pay | Admitting: Adult Health

## 2019-03-10 DIAGNOSIS — G4700 Insomnia, unspecified: Secondary | ICD-10-CM

## 2019-03-16 ENCOUNTER — Other Ambulatory Visit: Payer: Self-pay

## 2019-03-16 NOTE — Patient Outreach (Signed)
[facility]) Care Management  03/16/2019  Katie Huber Mar 02, 1951 [id number]    Medication Adherence call to Katie Huber [facility] [employer] spoke with patient she is past due by eleven days on Januvia 100 mg she explain she is quarantine and does not want to go out,patient knows the important of taking this medication but will pick up when she can.patient does not want to received any more call Katie Huber is showing past due under [place].   [facility] Management Direct Dial [phone]  Fax [phone] [email or web address]

## 2019-03-22 DIAGNOSIS — J45909 Unspecified asthma, uncomplicated: Secondary | ICD-10-CM | POA: Diagnosis not present

## 2019-04-06 ENCOUNTER — Ambulatory Visit (INDEPENDENT_AMBULATORY_CARE_PROVIDER_SITE_OTHER): Payer: Medicare Other | Admitting: Family Medicine

## 2019-04-06 ENCOUNTER — Encounter: Payer: Self-pay | Admitting: Family Medicine

## 2019-04-06 ENCOUNTER — Other Ambulatory Visit: Payer: Self-pay

## 2019-04-06 DIAGNOSIS — M545 Low back pain, unspecified: Secondary | ICD-10-CM

## 2019-04-06 DIAGNOSIS — W19XXXA Unspecified fall, initial encounter: Secondary | ICD-10-CM

## 2019-04-06 MED ORDER — TRAMADOL HCL 50 MG PO TABS: 50.0000 mg | ORAL_TABLET | Freq: Four times a day (QID) | ORAL | 0 refills | Status: DC | PRN
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
## 2019-04-10 ENCOUNTER — Other Ambulatory Visit: Payer: Self-pay | Admitting: Internal Medicine

## 2019-04-11 ENCOUNTER — Ambulatory Visit (INDEPENDENT_AMBULATORY_CARE_PROVIDER_SITE_OTHER): Payer: Medicare Other | Admitting: Family Medicine

## 2019-04-11 ENCOUNTER — Other Ambulatory Visit: Payer: Self-pay

## 2019-04-11 ENCOUNTER — Encounter: Payer: Self-pay | Admitting: Family Medicine

## 2019-04-11 DIAGNOSIS — M545 Low back pain, unspecified: Secondary | ICD-10-CM

## 2019-04-11 DIAGNOSIS — M546 Pain in thoracic spine: Secondary | ICD-10-CM | POA: Diagnosis not present

## 2019-04-11 MED ORDER — TRAMADOL HCL 50 MG PO TABS: 50.0000 mg | ORAL_TABLET | Freq: Four times a day (QID) | ORAL | 0 refills | Status: AC | PRN

## 2019-04-11 MED ORDER — PREDNISONE 10 MG PO TABS: ORAL_TABLET | ORAL | 0 refills | Status: DC
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
## 2019-04-12 ENCOUNTER — Ambulatory Visit (INDEPENDENT_AMBULATORY_CARE_PROVIDER_SITE_OTHER): Payer: Medicare Other

## 2019-04-12 ENCOUNTER — Encounter: Payer: Self-pay | Admitting: Internal Medicine

## 2019-04-12 ENCOUNTER — Ambulatory Visit: Payer: Medicare Other | Admitting: Internal Medicine

## 2019-04-12 VITALS — Ht 67.0 in | Wt 309.0 lb

## 2019-04-12 DIAGNOSIS — G4733 Obstructive sleep apnea (adult) (pediatric): Secondary | ICD-10-CM

## 2019-04-12 DIAGNOSIS — S299XXA Unspecified injury of thorax, initial encounter: Secondary | ICD-10-CM | POA: Diagnosis not present

## 2019-04-12 DIAGNOSIS — Z9989 Dependence on other enabling machines and devices: Secondary | ICD-10-CM

## 2019-04-12 DIAGNOSIS — G4700 Insomnia, unspecified: Secondary | ICD-10-CM | POA: Diagnosis not present

## 2019-04-12 DIAGNOSIS — J4550 Severe persistent asthma, uncomplicated: Secondary | ICD-10-CM | POA: Diagnosis not present

## 2019-04-12 DIAGNOSIS — M546 Pain in thoracic spine: Secondary | ICD-10-CM | POA: Diagnosis not present

## 2019-04-12 MED ORDER — ZOLPIDEM TARTRATE 10 MG PO TABS: 10.0000 mg | ORAL_TABLET | Freq: Every evening | ORAL | 1 refills | Status: DC | PRN
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
## 2019-04-12 NOTE — Telephone Encounter (Signed)
Last filled 07/26/18

## 2019-04-21 DIAGNOSIS — J45909 Unspecified asthma, uncomplicated: Secondary | ICD-10-CM | POA: Diagnosis not present

## 2019-04-27 ENCOUNTER — Other Ambulatory Visit: Payer: Self-pay

## 2019-04-27 ENCOUNTER — Ambulatory Visit
Admission: RE | Admit: 2019-04-27 | Discharge: 2019-04-27 | Disposition: A | Discharge status: Home / Self Care | Payer: Medicare Other | Source: Ambulatory Visit | Attending: Family Medicine | Admitting: Family Medicine

## 2019-04-27 DIAGNOSIS — M546 Pain in thoracic spine: Secondary | ICD-10-CM | POA: Diagnosis not present

## 2019-04-27 DIAGNOSIS — M5124 Other intervertebral disc displacement, thoracic region: Secondary | ICD-10-CM | POA: Diagnosis not present

## 2019-05-12 ENCOUNTER — Other Ambulatory Visit: Payer: Self-pay

## 2019-05-12 ENCOUNTER — Other Ambulatory Visit: Payer: Self-pay | Admitting: Internal Medicine

## 2019-05-12 MED ORDER — TRIAMTERENE-HCTZ 37.5-25 MG PO TABS: 1.0000 | ORAL_TABLET | Freq: Every day | ORAL | 1 refills | Status: DC

## 2019-05-12 NOTE — Telephone Encounter (Signed)
Medication has been refilled.

## 2019-05-12 NOTE — Telephone Encounter (Signed)
Copied from [place] [id number]. Topic: Quick Communication - Rx Refill/Question >> May 12, 2019 12:15 PM Sheran Luz wrote: Medication: triamterene-hydrochlorothiazide (MAXZIDE-25) 37.5-25 MG tablet [   Patient is requesting refill.  Preferred Pharmacy (with phone number or street name):[place], [place] [employer] [phone] (Phone) [phone] (Fax)

## 2019-05-22 DIAGNOSIS — J45909 Unspecified asthma, uncomplicated: Secondary | ICD-10-CM | POA: Diagnosis not present

## 2019-06-20 ENCOUNTER — Other Ambulatory Visit: Payer: Self-pay | Admitting: Internal Medicine

## 2019-06-20 MED ORDER — LOSARTAN POTASSIUM 100 MG PO TABS: 100.0000 mg | ORAL_TABLET | Freq: Every day | ORAL | 2 refills | Status: DC

## 2019-06-20 NOTE — Telephone Encounter (Signed)
Medication Refill - Medication: losartan (COZAAR) 100 MG tablet  Has the patient contacted their pharmacy? Yes - states they haven't received an answer (Agent: If no, request that the patient contact the pharmacy for the refill.) (Agent: If yes, when and what did the pharmacy advise?)  Preferred Pharmacy (with phone number or street name):  [place], [place] [phone] (Phone) [phone] (Fax)   Agent: Please be advised that RX refills may take up to 3 business days. We ask that you follow-up with your pharmacy.

## 2019-06-20 NOTE — Telephone Encounter (Signed)
Refill sent.

## 2019-06-21 DIAGNOSIS — J45909 Unspecified asthma, uncomplicated: Secondary | ICD-10-CM | POA: Diagnosis not present

## 2019-07-22 DIAGNOSIS — J45909 Unspecified asthma, uncomplicated: Secondary | ICD-10-CM | POA: Diagnosis not present

## 2019-07-25 ENCOUNTER — Other Ambulatory Visit: Payer: Self-pay

## 2019-07-25 NOTE — Patient Outreach (Signed)
[facility]) Care Management  07/25/2019  Katie Huber Apr 19, 1951 [id number]   Medication Adherence call to Katie Huber Compliant Voice message left with a call back number. Katie Huber is showing past due on Januvia 50 mg under [facility].  [place] Management Direct Dial [phone]  Fax [phone] [email or web address]

## 2019-08-05 ENCOUNTER — Other Ambulatory Visit: Payer: Self-pay

## 2019-08-05 NOTE — Patient Outreach (Signed)
[facility]) Care Management  08/05/2019  Katie Huber [birth year] [id number]   Medication Adherence call to Katie Huber Compliant Voice message left with a call back number. Katie Huber is showing past due on Januvia 50 mg under [place].   [place] Management Direct Dial [phone]  Fax [phone] [email or web address]

## 2019-08-17 ENCOUNTER — Other Ambulatory Visit: Payer: Self-pay | Admitting: Internal Medicine

## 2019-08-17 DIAGNOSIS — Z1231 Encounter for screening mammogram for malignant neoplasm of breast: Secondary | ICD-10-CM

## 2019-08-22 DIAGNOSIS — J45909 Unspecified asthma, uncomplicated: Secondary | ICD-10-CM | POA: Diagnosis not present

## 2019-08-25 ENCOUNTER — Other Ambulatory Visit: Payer: Self-pay

## 2019-08-25 MED ORDER — OMEPRAZOLE 20 MG PO CPDR: DELAYED_RELEASE_CAPSULE | ORAL | 1 refills | Status: DC

## 2019-08-31 ENCOUNTER — Ambulatory Visit (INDEPENDENT_AMBULATORY_CARE_PROVIDER_SITE_OTHER): Payer: Medicare Other

## 2019-08-31 ENCOUNTER — Other Ambulatory Visit: Payer: Self-pay

## 2019-08-31 DIAGNOSIS — Z23 Encounter for immunization: Secondary | ICD-10-CM

## 2019-09-06 ENCOUNTER — Telehealth: Payer: Self-pay

## 2019-09-06 MED ORDER — GABAPENTIN 300 MG PO CAPS: 600.0000 mg | ORAL_CAPSULE | Freq: Three times a day (TID) | ORAL | 1 refills | Status: DC

## 2019-09-06 NOTE — Telephone Encounter (Signed)
Medication refilled

## 2019-09-21 ENCOUNTER — Ambulatory Visit
Admission: RE | Admit: 2019-09-21 | Discharge: 2019-09-21 | Disposition: A | Discharge status: Home / Self Care | Payer: Medicare Other | Source: Ambulatory Visit | Attending: Internal Medicine | Admitting: Internal Medicine

## 2019-09-21 ENCOUNTER — Other Ambulatory Visit: Payer: Self-pay | Admitting: Internal Medicine

## 2019-09-21 DIAGNOSIS — Z1231 Encounter for screening mammogram for malignant neoplasm of breast: Secondary | ICD-10-CM | POA: Diagnosis not present

## 2019-09-21 DIAGNOSIS — J45909 Unspecified asthma, uncomplicated: Secondary | ICD-10-CM | POA: Diagnosis not present

## 2019-09-21 DIAGNOSIS — R928 Other abnormal and inconclusive findings on diagnostic imaging of breast: Secondary | ICD-10-CM

## 2019-09-21 DIAGNOSIS — N6310 Unspecified lump in the right breast, unspecified quadrant: Secondary | ICD-10-CM

## 2019-10-05 ENCOUNTER — Ambulatory Visit
Admission: RE | Admit: 2019-10-05 | Discharge: 2019-10-05 | Disposition: A | Discharge status: Home / Self Care | Payer: Medicare Other | Source: Ambulatory Visit | Attending: Internal Medicine | Admitting: Internal Medicine

## 2019-10-05 DIAGNOSIS — N6310 Unspecified lump in the right breast, unspecified quadrant: Secondary | ICD-10-CM | POA: Insufficient documentation

## 2019-10-05 DIAGNOSIS — R928 Other abnormal and inconclusive findings on diagnostic imaging of breast: Secondary | ICD-10-CM

## 2019-10-11 ENCOUNTER — Other Ambulatory Visit: Payer: Self-pay | Admitting: Internal Medicine

## 2019-10-18 ENCOUNTER — Other Ambulatory Visit: Payer: Self-pay | Admitting: Internal Medicine

## 2019-10-21 ENCOUNTER — Telehealth: Payer: Self-pay

## 2019-10-21 NOTE — Telephone Encounter (Signed)
Called lmom informing patient of appointment.

## 2019-10-22 DIAGNOSIS — J45909 Unspecified asthma, uncomplicated: Secondary | ICD-10-CM | POA: Diagnosis not present

## 2019-10-25 ENCOUNTER — Other Ambulatory Visit: Payer: Self-pay

## 2019-10-25 ENCOUNTER — Ambulatory Visit: Payer: Medicare Other | Admitting: Internal Medicine

## 2019-10-25 ENCOUNTER — Encounter: Payer: Self-pay | Admitting: Internal Medicine

## 2019-10-25 VITALS — Ht 67.0 in

## 2019-10-25 DIAGNOSIS — J4550 Severe persistent asthma, uncomplicated: Secondary | ICD-10-CM | POA: Diagnosis not present

## 2019-10-25 DIAGNOSIS — Z9989 Dependence on other enabling machines and devices: Secondary | ICD-10-CM

## 2019-10-25 DIAGNOSIS — G4700 Insomnia, unspecified: Secondary | ICD-10-CM | POA: Diagnosis not present

## 2019-10-25 DIAGNOSIS — G4733 Obstructive sleep apnea (adult) (pediatric): Secondary | ICD-10-CM | POA: Diagnosis not present

## 2019-10-25 DIAGNOSIS — I10 Essential (primary) hypertension: Secondary | ICD-10-CM

## 2019-10-25 MED ORDER — ZOLPIDEM TARTRATE 10 MG PO TABS: 10.0000 mg | ORAL_TABLET | Freq: Every evening | ORAL | 1 refills | Status: DC | PRN

## 2019-10-25 NOTE — Progress Notes (Signed)
[facility] [place], [place] [address]  Internal MEDICINE  Telephone Visit  Patient Name: Katie Huber  [id number]  [id number]  Date of Service: 11/06/2019  I connected with the patient at 244 by telephone and verified the patients identity using two identifiers.   I discussed the limitations, risks, security and privacy concerns of performing an evaluation and management service by telephone and the availability of in person appointments. I also discussed with the patient that there may be a patient responsible charge related to the service.  The patient expressed understanding and agrees to proceed.    Chief Complaint  Patient presents with  . Telephone Assessment  . Telephone Screen  . Hypertension  . Medication Refill    AMBIEN    HPI  PT is seen for follow up on pulmonary.  PT has a history of insomnia, osa, and asthma. Overall she is doing well.  Denies any recent issues or hospitalizations. She is requesting refill on her Ambien at this time.      Current Medication: Outpatient Encounter Medications as of 10/25/2019  Medication Sig  . acyclovir (ZOVIRAX) 400 MG tablet TAKE ONE TABLET BY MOUTH  FIVE TIMES DAILY  . albuterol (PROVENTIL HFA;VENTOLIN HFA) 108 (90 Base) MCG/ACT inhaler Inhale 2 puffs into the lungs every 6 (six) hours as needed for wheezing or shortness of breath.  [name] albuterol (PROVENTIL) (2.5 MG/3ML) 0.083% nebulizer solution Take 3 mLs (2.5 mg total) by nebulization every 6 (six) hours as needed for wheezing or shortness of breath.  [name] atorvastatin (LIPITOR) 20 MG tablet TAKE 1 TABLET BY MOUTH  DAILY  . chlorpheniramine-HYDROcodone (TUSSIONEX PENNKINETIC ER) 10-8 MG/5ML SUER Take 5 mLs by mouth at bedtime as needed.  . furosemide (LASIX) 20 MG tablet TAKE 1 TABLET BY MOUTH  DAILY AS NEEDED FOR FLUID  RETENTION  . gabapentin (NEURONTIN) 300 MG capsule Take 2 capsules (600 mg total) by mouth 3 (three) times daily.  [name] glucose blood test  strip OneTouch Verio Fex.    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 JANUVIA 50 MG tablet TAKE 1 TABLET BY MOUTH  DAILY  . losartan (COZAAR) 100 MG tablet Take 1 tablet (100 mg total) by mouth daily.  . montelukast (SINGULAIR) 10 MG tablet TAKE 1 TABLET BY MOUTH AT  BEDTIME  . omeprazole (PRILOSEC) 20 MG capsule TAKE 1 CAPSULE BY MOUTH TWO TIMES DAILY  . PARoxetine (PAXIL) 20 MG tablet TAKE 1 TABLET BY MOUTH IN  THE MORNING  . potassium chloride SA (K-DUR,KLOR-CON) 20 MEQ tablet TAKE 1 TABLET BY MOUTH  DAILY WHEN USING FUROSEMIDE  . predniSONE (DELTASONE) 10 MG tablet Use per dose pack  . promethazine (PHENERGAN) 25 MG tablet TAKE 1 TABLET BY MOUTH  EVERY 8 HOURS AS NEEDED FOR NAUSEA AND VOMITING  . Syringe/Needle, Disp, (SYRINGE 3CC/25GX1") 25G X 1" 3 ML MISC Use for b12 injections  . triamterene-hydrochlorothiazide (MAXZIDE-25) 37.5-25 MG tablet TAKE 1 TABLET BY MOUTH  DAILY  . WIXELA INHUB 250-50 MCG/DOSE AEPB USE 1 INHALATION BY MOUTH  INTO THE LUNGS TWO TIMES  DAILY  . zolpidem (AMBIEN) 10 MG tablet Take 1 tablet (10 mg total) by mouth at bedtime as needed. for sleep  . [DISCONTINUED] zolpidem (AMBIEN) 10 MG tablet Take 1 tablet (10 mg total) by mouth at bedtime as needed. for sleep   Facility-Administered Encounter Medications as of 10/25/2019  Medication  . pneumococcal 13-valent conjugate vaccine (PREVNAR 13) injection 0.5 mL  Surgical History: Past Surgical History:  Procedure Laterality Date  . ABDOMINAL HYSTERECTOMY  2001  . BREAST BIOPSY Right    Neg  . BREAST EXCISIONAL BIOPSY Right 1990s   multiple hematomas  . CHOLECYSTECTOMY  1970s  . COLONOSCOPY WITH PROPOFOL N/A 03/30/2018   Procedure: COLONOSCOPY WITH PROPOFOL;  Surgeon: Lucilla Lame, MD;  Location: [facility] ENDOSCOPY;  Service: Endoscopy;  Laterality: N/A;  . JOINT REPLACEMENT     Bilateral  . STOMACH SURGERY  [phone]   gastric partitionin( for obesity)     Medical History: Past Medical History:   Diagnosis Date  . Asthma 1990  . Hypertension   . Obesity, Class III, BMI 40-49.9 (morbid obesity) (HCC)    weight fluctuations of 100 lbs     Family History: Family History  Problem Relation Age of Onset  . Heart disease Father   . COPD Father   . Hyperlipidemia Father   . Mental illness Brother   . Breast cancer Neg Hx     Social History   Socioeconomic History  . Marital status: Single    Spouse name: Not on file  . Number of children: Not on file  . Years of education: Not on file  . Highest education level: Not on file  Occupational History  . Not on file  Social Needs  . Financial resource strain: Not hard at all  . Food insecurity    Worry: Never true    Inability: Never true  . Transportation needs    Medical: No    Non-medical: No  Tobacco Use  . Smoking status: Never Smoker  . Smokeless tobacco: Never Used  Substance and Sexual Activity  . Alcohol use: Yes    Comment: ocassionally  . Drug use: No  . Sexual activity: Never  Lifestyle  . Physical activity    Days per week: Not on file    Minutes per session: Not on file  . Stress: Not on file  Relationships  . Social Herbalist on phone: Not on file    Gets together: Not on file    Attends religious service: Not on file    Active member of club or organization: Not on file    Attends meetings of clubs or organizations: Not on file    Relationship status: Not on file  . Intimate partner violence    Fear of current or ex partner: Not on file    Emotionally abused: Not on file    Physically abused: Not on file    Forced sexual activity: Not on file  Other Topics Concern  . Not on file  Social History Narrative  . Not on file      Review of Systems  Constitutional: Negative for chills, fatigue and unexpected weight change.  HENT: Negative for congestion, rhinorrhea, sneezing and sore throat.   Eyes: Negative for photophobia, pain and redness.  Respiratory: Negative for cough,  chest tightness and shortness of breath.   Cardiovascular: Negative for chest pain and palpitations.  Gastrointestinal: Negative for abdominal pain, constipation, diarrhea, nausea and vomiting.  Endocrine: Negative.   Genitourinary: Negative for dysuria and frequency.  Musculoskeletal: Negative for arthralgias, back pain, joint swelling and neck pain.  Skin: Negative for rash.  Allergic/Immunologic: Negative.   Neurological: Negative for tremors and numbness.  Hematological: Negative for adenopathy. Does not bruise/bleed easily.  Psychiatric/Behavioral: Negative for behavioral problems and sleep disturbance. The patient is not nervous/anxious.     Vital Signs: Ht 5'  7" (1.702 m)   BMI 48.40 kg/m    Observation/Objective:  Well appearing, NAD noted.   Assessment/Plan: 1. OSA on CPAP Continue cpap therapy as directed.   2. Insomnia, unspecified type Reviewed risks and possible side effects associated with taking opiates, benzodiazepines and other CNS depressants. Combination of these could cause dizziness and drowsiness. Advised patient not to drive or operate machinery when taking these medications, as patient's and other's life can be at risk and will have consequences. Patient verbalized understanding in this matter. Dependence and abuse for these drugs will be monitored closely. A Controlled substance policy and procedure is on file which allows [facility] to order a urine drug screen test at any visit. Patient understands and agrees with the plan - zolpidem (AMBIEN) 10 MG tablet; Take 1 tablet (10 mg total) by mouth at bedtime as needed. for sleep  Dispense: 90 tablet; Refill: 1  3. Severe persistent asthma, unspecified whether complicated Stable, continue to use inhalers/nebulizers as directed.   4. Morbid obesity ([place]) Obesity Counseling: Risk Assessment: An assessment of behavioral risk factors was made today and includes lack of exercise sedentary lifestyle,  lack of portion control and poor dietary habits.  Risk Modification Advice: She was counseled on portion control guidelines. Restricting daily caloric intake to. . The detrimental long term effects of obesity on her health and ongoing poor compliance was also discussed with the patient.  5. Essential hypertension, benign Controlled currently, continue present therapy.   General Counseling: Brianna verbalizes understanding of the findings of today's phone visit and agrees with plan of treatment. I have discussed any further diagnostic evaluation that may be needed or ordered today. We also reviewed her medications today. she has been encouraged to call the office with any questions or concerns that should arise related to todays visit.    No orders of the defined types were placed in this encounter.   Meds ordered this encounter  Medications  . zolpidem (AMBIEN) 10 MG tablet    Sig: Take 1 tablet (10 mg total) by mouth at bedtime as needed. for sleep    Dispense:  90 tablet    Refill:  1    Time spent: 20 Minutes  This patient was seen by Orson Gear AGNP-C in Collaboration with Dr. Devona Konig as a part of collaborative care agreement.   Orson Gear AGNP-C Internal medicine

## 2019-11-08 DIAGNOSIS — G501 Atypical facial pain: Secondary | ICD-10-CM | POA: Diagnosis not present

## 2019-11-08 DIAGNOSIS — G500 Trigeminal neuralgia: Secondary | ICD-10-CM | POA: Diagnosis not present

## 2019-11-21 DIAGNOSIS — J45909 Unspecified asthma, uncomplicated: Secondary | ICD-10-CM | POA: Diagnosis not present

## 2019-11-27 ENCOUNTER — Other Ambulatory Visit: Payer: Self-pay | Admitting: Internal Medicine

## 2019-12-07 DIAGNOSIS — G500 Trigeminal neuralgia: Secondary | ICD-10-CM | POA: Diagnosis not present

## 2019-12-13 ENCOUNTER — Other Ambulatory Visit: Payer: Self-pay | Admitting: Neurology

## 2019-12-14 ENCOUNTER — Other Ambulatory Visit: Payer: Self-pay | Admitting: Neurology

## 2019-12-14 ENCOUNTER — Other Ambulatory Visit (HOSPITAL_COMMUNITY): Payer: Self-pay | Admitting: Neurology

## 2019-12-14 DIAGNOSIS — G500 Trigeminal neuralgia: Secondary | ICD-10-CM

## 2019-12-22 ENCOUNTER — Other Ambulatory Visit: Payer: Self-pay

## 2019-12-22 ENCOUNTER — Ambulatory Visit
Admission: RE | Admit: 2019-12-22 | Discharge: 2019-12-22 | Disposition: A | Discharge status: Home / Self Care | Payer: Medicare Other | Source: Ambulatory Visit | Attending: Neurology | Admitting: Neurology

## 2019-12-22 DIAGNOSIS — K1379 Other lesions of oral mucosa: Secondary | ICD-10-CM | POA: Diagnosis not present

## 2019-12-22 DIAGNOSIS — E119 Type 2 diabetes mellitus without complications: Secondary | ICD-10-CM | POA: Insufficient documentation

## 2019-12-22 DIAGNOSIS — I10 Essential (primary) hypertension: Secondary | ICD-10-CM | POA: Insufficient documentation

## 2019-12-22 DIAGNOSIS — G500 Trigeminal neuralgia: Secondary | ICD-10-CM | POA: Insufficient documentation

## 2019-12-22 DIAGNOSIS — J45909 Unspecified asthma, uncomplicated: Secondary | ICD-10-CM | POA: Diagnosis not present

## 2019-12-22 LAB — POCT I-STAT CREATININE: Creatinine, Ser: 0.8 mg/dL (ref 0.44–1.00)

## 2019-12-22 MED ORDER — GADOBUTROL 1 MMOL/ML IV SOLN
10.0000 mL | Freq: Once | INTRAVENOUS | Status: AC | PRN
  Administered 2019-12-22: 10 mL via INTRAVENOUS

## 2020-01-09 ENCOUNTER — Encounter: Payer: Self-pay | Admitting: Internal Medicine

## 2020-01-09 ENCOUNTER — Ambulatory Visit (INDEPENDENT_AMBULATORY_CARE_PROVIDER_SITE_OTHER): Payer: Medicare Other | Admitting: Internal Medicine

## 2020-01-09 ENCOUNTER — Other Ambulatory Visit: Payer: Self-pay

## 2020-01-09 VITALS — Ht 67.0 in | Wt 309.0 lb

## 2020-01-09 DIAGNOSIS — K1379 Other lesions of oral mucosa: Secondary | ICD-10-CM

## 2020-01-09 DIAGNOSIS — F5105 Insomnia due to other mental disorder: Secondary | ICD-10-CM | POA: Diagnosis not present

## 2020-01-09 DIAGNOSIS — I10 Essential (primary) hypertension: Secondary | ICD-10-CM | POA: Diagnosis not present

## 2020-01-09 DIAGNOSIS — E119 Type 2 diabetes mellitus without complications: Secondary | ICD-10-CM

## 2020-01-09 DIAGNOSIS — F409 Phobic anxiety disorder, unspecified: Secondary | ICD-10-CM

## 2020-01-09 DIAGNOSIS — E1169 Type 2 diabetes mellitus with other specified complication: Secondary | ICD-10-CM | POA: Diagnosis not present

## 2020-01-09 DIAGNOSIS — E559 Vitamin D deficiency, unspecified: Secondary | ICD-10-CM | POA: Diagnosis not present

## 2020-01-09 DIAGNOSIS — E669 Obesity, unspecified: Secondary | ICD-10-CM

## 2020-01-09 NOTE — Assessment & Plan Note (Signed)
Uncontrolled by history of fastings ranging from  170 to 220 .  Taking januvia 50 mg only .  Needs A1c,  Warned that  insulin may be needed of a1c is > 8l.0\

## 2020-01-09 NOTE — Assessment & Plan Note (Signed)
History and description of pain is not consistent with Trigeminal pain.  It is dull and throbbing and limited to the cavity area,  Checking  ESR and  CRP . May need bone scan or return to ENT Katie Huber )

## 2020-01-09 NOTE — Progress Notes (Signed)
Virtual Visit via [email or web address]  This visit type was conducted due to national recommendations for restrictions regarding the COVID-19 pandemic (e.g. social distancing).  This format is felt to be most appropriate for this patient at this time.  All issues noted in this document were discussed and addressed.  No physical exam was performed (except for noted visual exam findings with Video Visits).   I connected with@ on 01/09/20 at  1:30 PM EST by a video enabled telemedicine application  and verified that I am speaking with the correct person using two identifiers. Location patient: home Location provider: work or home office Persons participating in the virtual visit: patient, provider  I discussed the limitations, risks, security and privacy concerns of performing an evaluation and management service by telephone and the availability of in person appointments. I also discussed with the patient that there may be a patient responsible charge related to this service. The patient expressed understanding and agreed to proceed.  Reason for visit: follow up   HPI:  69 yr old female with morbid obesity type 2 DM last seen one year ago for follow up.    Has been plagued with oral cavity pain ,  Left upper maxilla,  Started in 1984 after a root canal,  Worse for the past two years after having a crown and the root filler removed.  Saw [facility] oral surgery  Additional films negative . Then sent to ENT , ,  Then neurology,  Told by ENT and neuralgia she had trigeminal neuralgia and MRI trigeminal nerve is normal.  On high dose gabapentin,  No help,  Only tylenol helps transiently. .  DM: sugars 145 to 175 fasting   Now 175  To 223.  Since jan 1 eating more out of boredom .[name] not walking,    Had MRI trigeminal nerve due to persistent  oral cavity pain after having oral surgery by [facility]  .  [place]   ROS: See pertinent positives and negatives per HPI.  Past Medical History:  Diagnosis Date  .  Asthma 1990  . Hypertension   . Obesity, Class III, BMI 40-49.9 (morbid obesity) (HCC)    weight fluctuations of 100 lbs     Past Surgical History:  Procedure Laterality Date  . ABDOMINAL HYSTERECTOMY  2001  . BREAST BIOPSY Right    Neg  . BREAST EXCISIONAL BIOPSY Right 1990s   multiple hematomas  . CHOLECYSTECTOMY  1970s  . COLONOSCOPY WITH PROPOFOL N/A 03/30/2018   Procedure: COLONOSCOPY WITH PROPOFOL;  Surgeon: Lucilla Lame, MD;  Location: [facility] ENDOSCOPY;  Service: Endoscopy;  Laterality: N/A;  . JOINT REPLACEMENT     Bilateral  . STOMACH SURGERY  [phone]   gastric partitionin( for obesity)     Family History  Problem Relation Age of Onset  . Heart disease Father   . COPD Father   . Hyperlipidemia Father   . Mental illness Brother   . Breast cancer Neg Hx     SOCIAL HX:  reports that she has never smoked. She has never used smokeless tobacco. She reports current alcohol use. She reports that she does not use drugs.   Current Outpatient Medications:  .  acyclovir (ZOVIRAX) 400 MG tablet, TAKE ONE TABLET BY MOUTH  FIVE TIMES DAILY, Disp: 360 tablet, Rfl: 1 .  albuterol (PROVENTIL HFA;VENTOLIN HFA) 108 (90 Base) MCG/ACT inhaler, Inhale 2 puffs into the lungs every 6 (six) hours as needed for wheezing or shortness of breath., Disp: 3 Inhaler, Rfl:  1 .  albuterol (PROVENTIL) (2.5 MG/3ML) 0.083% nebulizer solution, Take 3 mLs (2.5 mg total) by nebulization every 6 (six) hours as needed for wheezing or shortness of breath., Disp: 150 mL, Rfl: 1 .  atorvastatin (LIPITOR) 20 MG tablet, TAKE 1 TABLET BY MOUTH  DAILY, Disp: 90 tablet, Rfl: 1 .  furosemide (LASIX) 20 MG tablet, TAKE 1 TABLET BY MOUTH  DAILY AS NEEDED FOR FLUID  RETENTION, Disp: 90 tablet, Rfl: 1 .  gabapentin (NEURONTIN) 300 MG capsule, TAKE 2 CAPSULES BY MOUTH 3  TIMES DAILY, Disp: 540 capsule, Rfl: 3 .  glucose blood test strip, OneTouch Verio Fex.    Test one time daily for steroid induced diabetes, Disp: 100 each,  Rfl: 0 .  JANUVIA 50 MG tablet, TAKE 1 TABLET BY MOUTH  DAILY, Disp: 90 tablet, Rfl: 0 .  losartan (COZAAR) 100 MG tablet, Take 1 tablet (100 mg total) by mouth daily., Disp: 90 tablet, Rfl: 2 .  montelukast (SINGULAIR) 10 MG tablet, TAKE 1 TABLET BY MOUTH AT  BEDTIME, Disp: 90 tablet, Rfl: 3 .  omeprazole (PRILOSEC) 20 MG capsule, TAKE 1 CAPSULE BY MOUTH  TWICE DAILY, Disp: 180 capsule, Rfl: 3 .  PARoxetine (PAXIL) 20 MG tablet, TAKE 1 TABLET BY MOUTH IN  THE MORNING, Disp: 90 tablet, Rfl: 3 .  potassium chloride SA (K-DUR,KLOR-CON) 20 MEQ tablet, TAKE 1 TABLET BY MOUTH  DAILY WHEN USING FUROSEMIDE, Disp: 90 tablet, Rfl: 1 .  promethazine (PHENERGAN) 25 MG tablet, TAKE 1 TABLET BY MOUTH  EVERY 8 HOURS AS NEEDED FOR NAUSEA AND VOMITING, Disp: 90 tablet, Rfl: 0 .  Syringe/Needle, Disp, (SYRINGE 3CC/25GX1") 25G X 1" 3 ML MISC, Use for b12 injections, Disp: 50 each, Rfl: 0 .  triamterene-hydrochlorothiazide (MAXZIDE-25) 37.5-25 MG tablet, TAKE 1 TABLET BY MOUTH  DAILY, Disp: 90 tablet, Rfl: 0 .  WIXELA INHUB 250-50 MCG/DOSE AEPB, USE 1 INHALATION BY MOUTH  INTO THE LUNGS TWO TIMES  DAILY, Disp: 180 each, Rfl: 1 .  zolpidem (AMBIEN) 10 MG tablet, Take 1 tablet (10 mg total) by mouth at bedtime as needed. for sleep, Disp: 90 tablet, Rfl: 1  Current Facility-Administered Medications:  .  pneumococcal 13-valent conjugate vaccine (PREVNAR 13) injection 0.5 mL, 0.5 mL, Intramuscular, Once, Derrel Nip, Aris Everts, MD  EXAM:  VITALS per patient if applicable:  GENERAL: alert, oriented, appears well and in no acute distress  HEENT: atraumatic, conjunttiva clear, no obvious abnormalities on inspection of external nose and ears  NECK: normal movements of the head and neck  LUNGS: on inspection no signs of respiratory distress, breathing rate appears normal, no obvious gross SOB, gasping or wheezing  CV: no obvious cyanosis  MS: moves all visible extremities without noticeable abnormality  PSYCH/NEURO:  pleasant and cooperative, no obvious depression or anxiety, speech and thought processing grossly intact  ASSESSMENT AND PLAN:  Discussed the following assessment and plan:  Insomnia due to anxiety and fear  Essential hypertension, benign - Plan: Comprehensive metabolic panel, Lipid panel  Type 2 diabetes mellitus with obesity ([place]) - Plan: Hemoglobin A1c, Microalbumin / creatinine urine ratio  Oral cavity pain - Plan: Sedimentation rate, CBC with Differential/Platelet, C-reactive protein  Vitamin D deficiency - Plan: VITAMIN D 25 Hydroxy (Vit-D Deficiency, Fractures)  Type 2 diabetes mellitus with obesity ([place]) Uncontrolled by history of fastings ranging from  170 to 220 .  Taking januvia 50 mg only .  Needs A1c,  Warned that  insulin may be needed of a1c is >  8l.0\  Oral cavity pain History and description of pain is not consistent with Trigeminal pain.  It is dull and throbbing and limited to the cavity area,  Checking  ESR and  CRP . May need bone scan or return to ENT Kathyrn Sheriff )    I discussed the assessment and treatment plan with the patient. The patient was provided an opportunity to ask questions and all were answered. The patient agreed with the plan and demonstrated an understanding of the instructions.   The patient was advised to call back or seek an in-person evaluation if the symptoms worsen or if the condition fails to improve as anticipated.    I provided  40 minutes of non-face-to-face time during this encounter reviewing patient's current problems and post surgeries.  Providing counseling on the above mentioned problems , and coordination  of care .  [name], MD

## 2020-01-22 DIAGNOSIS — J45909 Unspecified asthma, uncomplicated: Secondary | ICD-10-CM | POA: Diagnosis not present

## 2020-01-30 ENCOUNTER — Other Ambulatory Visit: Payer: Self-pay | Admitting: Internal Medicine

## 2020-01-30 ENCOUNTER — Other Ambulatory Visit (INDEPENDENT_AMBULATORY_CARE_PROVIDER_SITE_OTHER): Payer: Medicare Other

## 2020-01-30 ENCOUNTER — Other Ambulatory Visit: Payer: Self-pay

## 2020-01-30 DIAGNOSIS — K1379 Other lesions of oral mucosa: Secondary | ICD-10-CM | POA: Diagnosis not present

## 2020-01-30 DIAGNOSIS — E559 Vitamin D deficiency, unspecified: Secondary | ICD-10-CM

## 2020-01-30 DIAGNOSIS — E669 Obesity, unspecified: Secondary | ICD-10-CM | POA: Diagnosis not present

## 2020-01-30 DIAGNOSIS — E1169 Type 2 diabetes mellitus with other specified complication: Secondary | ICD-10-CM | POA: Diagnosis not present

## 2020-01-30 DIAGNOSIS — I10 Essential (primary) hypertension: Secondary | ICD-10-CM

## 2020-01-30 LAB — COMPREHENSIVE METABOLIC PANEL
ALT: 14 U/L (ref 0–35)
AST: 15 U/L (ref 0–37)
Albumin: 3.9 g/dL (ref 3.5–5.2)
Alkaline Phosphatase: 75 U/L (ref 39–117)
BUN: 13 mg/dL (ref 6–23)
CO2: 29 mEq/L (ref 19–32)
Calcium: 9.9 mg/dL (ref 8.4–10.5)
Chloride: 99 mEq/L (ref 96–112)
Creatinine, Ser: 0.6 mg/dL (ref 0.40–1.20)
GFR: 99.21 mL/min (ref >60.00)
Glucose, Bld: 221 mg/dL — ABNORMAL HIGH (ref 70–99)
Potassium: 3.9 mEq/L (ref 3.5–5.1)
Sodium: 136 mEq/L (ref 135–145)
Total Bilirubin: 0.7 mg/dL (ref 0.2–1.2)
Total Protein: 7 g/dL (ref 6.0–8.3)

## 2020-01-30 LAB — CBC WITH DIFFERENTIAL/PLATELET
Basophils Absolute: 0.1 10*3/uL (ref 0.0–0.1)
Basophils Relative: 1.1 % (ref 0.0–3.0)
Eosinophils Absolute: 0.1 10*3/uL (ref 0.0–0.7)
Eosinophils Relative: 2.8 % (ref 0.0–5.0)
HCT: 40.1 % (ref 36.0–46.0)
Hemoglobin: 13.4 g/dL (ref 12.0–15.0)
Lymphocytes Relative: 31.6 % (ref 12.0–46.0)
Lymphs Abs: 1.4 10*3/uL (ref 0.7–4.0)
MCHC: 33.4 g/dL (ref 30.0–36.0)
MCV: 90.8 fl (ref 78.0–100.0)
Monocytes Absolute: 0.4 10*3/uL (ref 0.1–1.0)
Monocytes Relative: 8.9 % (ref 3.0–12.0)
Neutro Abs: 2.5 10*3/uL (ref 1.4–7.7)
Neutrophils Relative %: 55.6 % (ref 43.0–77.0)
Platelets: 217 10*3/uL (ref 150.0–400.0)
RBC: 4.42 Mil/uL (ref 3.87–5.11)
RDW: 13.5 % (ref 11.5–15.5)
WBC: 4.5 10*3/uL (ref 4.0–10.5)

## 2020-01-30 LAB — LIPID PANEL
Cholesterol: 152 mg/dL (ref 0–200)
HDL: 48.3 mg/dL (ref >39.00)
LDL Cholesterol: 84 mg/dL (ref 0–99)
NonHDL: 104.06
Total CHOL/HDL Ratio: 3
Triglycerides: 99 mg/dL (ref 0.0–149.0)
VLDL: 19.8 mg/dL (ref 0.0–40.0)

## 2020-01-30 LAB — SEDIMENTATION RATE: Sed Rate: 23 mm/hr (ref 0–30)

## 2020-01-30 LAB — C-REACTIVE PROTEIN: CRP: <1 mg/dL (ref 0.5–20.0)

## 2020-01-30 LAB — HEMOGLOBIN A1C: Hgb A1c MFr Bld: 8.4 % — ABNORMAL HIGH (ref 4.6–6.5)

## 2020-01-30 LAB — VITAMIN D 25 HYDROXY (VIT D DEFICIENCY, FRACTURES): VITD: 19.07 ng/mL — ABNORMAL LOW (ref 30.00–100.00)

## 2020-01-30 NOTE — Addendum Note (Signed)
Addended by: Leeanne Rio on: 01/30/2020 12:27 PM   Modules accepted: Orders

## 2020-01-31 ENCOUNTER — Other Ambulatory Visit: Payer: Self-pay | Admitting: Internal Medicine

## 2020-01-31 MED ORDER — BASAGLAR KWIKPEN 100 UNIT/ML ~~LOC~~ SOPN: PEN_INJECTOR | SUBCUTANEOUS | 0 refills | Status: DC

## 2020-02-01 ENCOUNTER — Telehealth: Payer: Self-pay | Admitting: Internal Medicine

## 2020-02-01 MED ORDER — TOUJEO MAX SOLOSTAR 300 UNIT/ML ~~LOC~~ SOPN: 20.0000 [IU] | PEN_INJECTOR | Freq: Every day | SUBCUTANEOUS | 1 refills | Status: DC

## 2020-02-01 MED ORDER — SITAGLIPTIN PHOSPHATE 50 MG PO TABS: 50.0000 mg | ORAL_TABLET | Freq: Every day | ORAL | 3 refills | Status: DC

## 2020-02-01 NOTE — Telephone Encounter (Signed)
Pt called and wanted to know if she needed to stop taking the JANUVIA 50 MG tablet

## 2020-02-01 NOTE — Addendum Note (Signed)
Addended by: [name] on: 02/01/2020 05:55 PM   Modules accepted: Orders

## 2020-02-01 NOTE — Telephone Encounter (Signed)
Spoke with pt to let her know that she was to continue taking the Januvia, and the insulin. Pt stated that she has not received the insulin yet. There is a form in your quick sign folder from [employer] stating that they do not cover Basaglar and have asked if it could be changed to one of the alternatives on the form. I explained that to the pt and she mentioned that money is an issue for her because she is already paying $300 for a 90 day supply of the Januvia. She also mentioned seeing if there was something else that may be cheaper for her to take other than the Januvia. Pt has already tried Metformin and was unable to tolerate it due to diarrhea.

## 2020-02-01 NOTE — Telephone Encounter (Signed)
Please ask for More background next time for questions like this'  Continue januvia,  Start insulin,  rx sent to mail order for [place]

## 2020-02-01 NOTE — Telephone Encounter (Signed)
I have sent in an alternative covered insulin,  And she can stop the [place].  You will need ot call the [employer] people to dc the [place] because I sent in a refill today before the additional information was obtained re cost   No additional meds until she has a week of sugars recorded after starting the [place] insulin \

## 2020-02-01 NOTE — Addendum Note (Signed)
Addended by: [name] on: 02/01/2020 03:54 PM   Modules accepted: Orders

## 2020-02-02 ENCOUNTER — Other Ambulatory Visit: Payer: Self-pay

## 2020-02-02 ENCOUNTER — Ambulatory Visit (INDEPENDENT_AMBULATORY_CARE_PROVIDER_SITE_OTHER): Payer: Medicare Other

## 2020-02-02 ENCOUNTER — Ambulatory Visit: Payer: Medicare Other

## 2020-02-02 VITALS — Ht 67.0 in | Wt 309.0 lb

## 2020-02-02 DIAGNOSIS — Z0000 Encounter for general adult medical examination without abnormal findings: Secondary | ICD-10-CM | POA: Diagnosis not present

## 2020-02-02 NOTE — Progress Notes (Addendum)
Subjective:   Katie Huber is a 69 y.o. female who presents for Medicare Annual (Subsequent) preventive examination.  Review of Systems:  No ROS.  Medicare Wellness Virtual Visit.  Visual/audio telehealth visit, UTA vital signs.   Ht/Wt provided. See social history for additional risk factors.   Cardiac Risk Factors include: advanced age (>33men, >80 women);hypertension;diabetes mellitus     Objective:     Vitals: Ht 5\' 7"  (1.702 m)   Wt (!) 309 lb (140.2 kg)   BMI 48.40 kg/m   Body mass index is 48.4 kg/m.  Advanced Directives 02/02/2020 01/28/2019 03/30/2018 09/09/2017 03/04/2017 03/03/2017  Does Patient Have a Medical Advance Directive? Yes Yes Yes Yes Yes Yes  Type of Paramedic of [place];Living will [place];Living will - [place];Living will [place];Living will Living will;Healthcare Power of Attorney  Does patient want to make changes to medical advance directive? No - Patient declined No - Patient declined - No - Patient declined No - Patient declined -  Copy of [place] in Chart? No - copy requested No - copy requested - No - copy requested No - copy requested -  Pre-existing out of facility DNR order (yellow form or pink MOST form) - - Yellow form placed in chart (order not valid for inpatient use) - - -    Tobacco Social History   Tobacco Use  Smoking Status Never Smoker  Smokeless Tobacco Never Used     Counseling given: Not Answered   Clinical Intake:  Pre-visit preparation completed: Yes        Diabetes: Yes(Followed by pcp)  How often do you need to have someone help you when you read instructions, pamphlets, or other written materials from your doctor or pharmacy?: 1 - Never  Interpreter Needed?: No     Past Medical History:  Diagnosis Date   Asthma 1990   Hypertension    Obesity, Class III, BMI 40-49.9 (morbid obesity) ([place])    weight  fluctuations of 100 lbs    Past Surgical History:  Procedure Laterality Date   ABDOMINAL HYSTERECTOMY  2001   BREAST BIOPSY Right    Neg   BREAST EXCISIONAL BIOPSY Right 1990s   multiple hematomas   CHOLECYSTECTOMY  1970s   COLONOSCOPY WITH PROPOFOL N/A 03/30/2018   Procedure: COLONOSCOPY WITH PROPOFOL;  Surgeon: Lucilla Lame, MD;  Location: [facility] ENDOSCOPY;  Service: Endoscopy;  Laterality: N/A;   JOINT REPLACEMENT     Bilateral   STOMACH SURGERY  [phone]   gastric partitionin( for obesity)    Family History  Problem Relation Age of Onset   Heart disease Father    COPD Father    Hyperlipidemia Father    Mental illness Brother    Breast cancer Neg Hx    Social History   Socioeconomic History   Marital status: Single    Spouse name: Not on file   Number of children: Not on file   Years of education: Not on file   Highest education level: Not on file  Occupational History   Not on file  Tobacco Use   Smoking status: Never Smoker   Smokeless tobacco: Never Used  Substance and Sexual Activity   Alcohol use: Yes    Comment: ocassionally   Drug use: No   Sexual activity: Never  Other Topics Concern   Not on file  Social History Narrative   Not on file   Social Determinants of Health  Financial Resource Strain:    Difficulty of Paying Living Expenses: Not on file  Food Insecurity:    Worried About Charity fundraiser in the Last Year: Not on file   [employer] of Food in the Last Year: Not on file  Transportation Needs:    Lack of Transportation (Medical): Not on file   Lack of Transportation (Non-Medical): Not on file  Physical Activity:    Days of Exercise per Week: Not on file   Minutes of Exercise per Session: Not on file  Stress:    Feeling of Stress : Not on file  Social Connections:    Frequency of Communication with Friends and Family: Not on file   Frequency of Social Gatherings with Friends and Family: Not on file   Attends Religious Services: Not on file    Active Member of Clubs or Organizations: Not on file   Attends Club or Organization Meetings: Not on file   Marital Status: Not on file    Outpatient Encounter Medications as of 02/02/2020  Medication Sig   acyclovir (ZOVIRAX) 400 MG tablet TAKE ONE TABLET BY MOUTH  FIVE TIMES DAILY   albuterol (PROVENTIL HFA;VENTOLIN HFA) 108 (90 Base) MCG/ACT inhaler Inhale 2 puffs into the lungs every 6 (six) hours as needed for wheezing or shortness of breath.   albuterol (PROVENTIL) (2.5 MG/3ML) 0.083% nebulizer solution Take 3 mLs (2.5 mg total) by nebulization every 6 (six) hours as needed for wheezing or shortness of breath.   atorvastatin (LIPITOR) 20 MG tablet TAKE 1 TABLET BY MOUTH  DAILY   furosemide (LASIX) 20 MG tablet TAKE 1 TABLET BY MOUTH  DAILY AS NEEDED FOR FLUID  RETENTION   gabapentin (NEURONTIN) 300 MG capsule TAKE 2 CAPSULES BY MOUTH 3  TIMES DAILY   glucose blood test strip OneTouch Verio Fex.    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   losartan (COZAAR) 100 MG tablet Take 1 tablet (100 mg total) by mouth daily.   montelukast (SINGULAIR) 10 MG tablet TAKE 1 TABLET BY MOUTH AT  BEDTIME   omeprazole (PRILOSEC) 20 MG capsule TAKE 1 CAPSULE BY MOUTH  TWICE DAILY   PARoxetine (PAXIL) 20 MG tablet TAKE 1 TABLET BY MOUTH IN  THE MORNING   potassium chloride SA (K-DUR,KLOR-CON) 20 MEQ tablet TAKE 1 TABLET BY MOUTH  DAILY WHEN USING FUROSEMIDE   Probiotic Product (PROBIOTIC DAILY PO) Take 1 capsule by mouth daily.   promethazine (PHENERGAN) 25 MG tablet TAKE 1 TABLET BY MOUTH  EVERY 8 HOURS AS NEEDED FOR NAUSEA AND VOMITING   triamterene-hydrochlorothiazide (MAXZIDE-25) 37.5-25 MG tablet TAKE 1 TABLET BY MOUTH  DAILY   VITAMIN D, CHOLECALCIFEROL, PO Take 1 tablet by mouth daily.   WIXELA INHUB 250-50 MCG/DOSE AEPB USE 1 INHALATION BY MOUTH  INTO THE LUNGS TWO TIMES   DAILY   zolpidem (AMBIEN) 10 MG tablet Take 1 tablet (10 mg total) by mouth at bedtime as needed. for sleep   JANUVIA 50 MG tablet Take 50 mg by mouth daily.   Syringe/Needle, Disp, (SYRINGE 3CC/25GX1") 25G X 1" 3 ML MISC Use for b12 injections   Facility-Administered Encounter Medications as of 02/02/2020  Medication   pneumococcal 13-valent conjugate vaccine (PREVNAR 13) injection 0.5 mL    Activities of Daily Living In your present state of health, do you have any difficulty performing the  following activities: 02/02/2020  Hearing? N  Vision? N  Difficulty concentrating or making decisions? N  Walking or climbing stairs? Y  Comment [place] herself  Dressing or bathing? N  Doing errands, shopping? N  Preparing Food and eating ? N  Using the Toilet? N  In the past six months, have you accidently leaked urine? N  Do you have problems with loss of bowel control? N  Managing your Medications? N  Managing your Finances? N  Housekeeping or managing your Housekeeping? N  Some recent data might be hidden    Patient Care Team: [name], MD as PCP - General (Internal Medicine)    Assessment:   This is a routine wellness examination for Ciarra.  .Nurse connected with patient 02/02/20 at 12:30 PM EST by a telephone enabled telemedicine application and verified that I am speaking with the correct person using two identifiers. Patient stated full name and DOB. Patient gave permission to continue with virtual visit. Patient's location was at home and Nurse's location was at [place] office.   Patient is alert and oriented x3. Patient denies difficulty focusing or concentrating. Patient likes to read, play computer games, complete puzzles, online Bible study and watches tv for brain stimulation.   Health Maintenance Due: -Eye Exam- plans to schedule later in the season -Hgb A1c- 01/30/20 (8.4) See completed HM at the end of note.   Eye: Visual acuity not assessed. Virtual visit.  Followed by their ophthalmologist.  Retinopathy- none reported.  Dental: Visits every 6 months.    Hearing: Demonstrates normal hearing during visit.  Safety:  Patient feels safe at home- yes Patient does have smoke detectors at home- yes Patient does wear sunscreen or protective clothing when in direct sunlight - yes Patient does wear seat belt when in a moving vehicle - yes Patient drives- yes Adequate lighting in walkways free from debris- yes Grab bars and handrails used as appropriate- yes Ambulates with an assistive device- no Cell phone on person when ambulating outside of the home- yes  Social: Alcohol intake - yes      Smoking history- never  Smokers in home? none Illicit drug use? none  Medication: Taking as directed and without issues.  Pill box in use -yes  Self managed - yes   Covid-19: Precautions and sickness symptoms discussed. Wears mask, social distancing, hand hygiene as appropriate.   Activities of Daily Living Patient denies needing assistance with: household chores, feeding themselves, getting from bed to chair, getting to the toilet, bathing/showering, dressing, managing money, or preparing meals.   Discussed the importance of a healthy diet, water intake and the benefits of aerobic exercise.   Physical activity- active around the home  Diet:  Regular Water: 4 cups  Caffeine: 2 cups, 1-2 diet pepsi  Other Providers Patient Care Team: [name], MD as PCP - General (Internal Medicine)  Exercise Activities and Dietary recommendations Current Exercise Habits: Home exercise routine  Goals       Patient Stated    Increase physical activity (pt-stated)     Stay active        Fall Risk Fall Risk  02/02/2020 01/09/2020 10/25/2019 04/11/2019 01/28/2019  Falls in the past year? (No Data) 1 0 1 0  Comment None since last reported less than 1 month ago - - - -  Number falls in past yr: - 0 - 0 -  Injury with Fall? - 0 - 1 -  Risk for fall  due to : - - -  Other (Comment) -  Follow up Falls prevention discussed Falls evaluation completed - Follow up appointment -   Timed Get Up and Go performed: no, virtual visit  Depression Screen PHQ 2/9 Scores 02/02/2020 10/25/2019 04/11/2019 01/28/2019  PHQ - 2 Score 0 0 0 0  PHQ- 9 Score - - 1 -     Cognitive Function MMSE - Mini Mental State Exam 01/28/2019  Orientation to time 5  Orientation to Place 5  Registration 3  Attention/ Calculation 5  Recall 3  Language- name 2 objects 2  Language- repeat 1  Language- follow 3 step command 3  Language- read & follow direction 1  Write a sentence 1  Copy design 1  Total score 30     6CIT Screen 02/02/2020 09/09/2017  What Year? 0 points 0 points  What month? 0 points 0 points  What time? 0 points 0 points  Count back from 20 - 0 points  Months in reverse - 0 points  Repeat phrase - 0 points  Total Score - 0    Immunization History  Administered Date(s) Administered   Fluad Quad(high Dose 65+) 08/31/2019   Influenza, High Dose Seasonal PF 09/09/2017, 02/09/2019   PFIZER SARS-COV-2 Vaccination 01/26/2020   Pneumococcal Conjugate-13 02/06/2015   Pneumococcal Polysaccharide-23 09/20/2010, 09/09/2017   Tdap 09/20/2013   Zoster 06/13/2014   Screening Tests Health Maintenance  Topic Date Due   Fecal DNA (Cologuard)  09/10/2018   OPHTHALMOLOGY EXAM  10/05/2018   HEMOGLOBIN A1C  08/01/2020   MAMMOGRAM  09/20/2020   FOOT EXAM  01/08/2021   TETANUS/TDAP  09/21/2023   INFLUENZA VACCINE  Completed   DEXA SCAN  Completed   Hepatitis C Screening  Completed   PNA vac Low Risk Adult  Completed      Plan:   Keep all routine maintenance appointments.   Follow up 02/29/20 @ 2:00  Medicare Attestation I have personally reviewed: The patient's medical and social history Their use of alcohol, tobacco or illicit drugs Their current medications and supplements The patient's functional ability including ADLs,fall risks, home safety  risks, cognitive, and hearing and visual impairment Diet and physical activities Evidence for depression   I have reviewed and discussed with patient certain preventive protocols, quality metrics, and best practice recommendations.     OBrien-Blaney, Kaydynce Pat L, LPN  [id number]   I have reviewed the above information and agree with above.   Deborra Medina, MD

## 2020-02-02 NOTE — Telephone Encounter (Signed)
Spoke with pt to let her know that Dr. Derrel Nip would like for her to stop the Januvia and start the Toujeo insulin injections and the directions on how to take it. Also told the pt that I would call [facility] to cancel the rx that was sent in yesterday. Called [facility] they stated that they shipped out the rx yesterday. They stated that pt can ship rx back as soon as she gets the box. Called pt back explained to her that when she receives the Januvia to call [facility] customer service and let them know that she needs to send back a medication that was stopped by her doctor and they will refund her when they receive the medication. Pt gave a verbal understanding.

## 2020-02-02 NOTE — Patient Instructions (Addendum)
  Katie Huber , Thank you for taking time to come for your Medicare Wellness Visit. I appreciate your ongoing commitment to your health goals. Please review the following plan we discussed and let me know if I can assist you in the future.   These are the goals we discussed: Goals      Patient Stated   . Increase physical activity (pt-stated)     Stay active       This is a list of the screening recommended for you and due dates:  Health Maintenance  Topic Date Due  . Cologuard (Stool DNA test)  09/10/2018  . Eye exam for diabetics  10/05/2018  . Hemoglobin A1C  08/01/2020  . Mammogram  09/20/2020  . Complete foot exam   01/08/2021  . Tetanus Vaccine  09/21/2023  . Flu Shot  Completed  . DEXA scan (bone density measurement)  Completed  .  Hepatitis C: One time screening is recommended by Center for Disease Control  (CDC) for  adults born from [birth year] through 1965.   Completed  . Pneumonia vaccines  Completed

## 2020-02-03 ENCOUNTER — Telehealth: Payer: Self-pay | Admitting: Internal Medicine

## 2020-02-03 MED ORDER — "SYRINGE 25G X 1"" 3 ML MISC": 0 refills | Status: DC

## 2020-02-03 NOTE — Telephone Encounter (Signed)
Pt needs refill on Syringe/Needle, Disp, (SYRINGE 3CC/25GX1") 25G X 1" 3 ML MISC-- ASAP

## 2020-02-06 ENCOUNTER — Telehealth: Payer: Self-pay | Admitting: Internal Medicine

## 2020-02-06 MED ORDER — LOSARTAN POTASSIUM 100 MG PO TABS: 100.0000 mg | ORAL_TABLET | Freq: Every day | ORAL | 1 refills | Status: DC

## 2020-02-06 NOTE — Telephone Encounter (Signed)
Received fax from patient's pharmacy for refill of Cozaar.  Patient now sees Dr Derrel Nip. Last seen 01/09/20. Med last sent 06/20/19.   Medication sent in to preferred pharmacy per protocol.

## 2020-02-07 MED ORDER — PEN NEEDLES 32G X 6 MM MISC: 1.0000 "application " | Freq: Every day | 3 refills | Status: DC

## 2020-02-07 NOTE — Telephone Encounter (Signed)
Insulin pen needles have been sent in to [facility]. Pt is aware.

## 2020-02-07 NOTE — Telephone Encounter (Signed)
Pt said [facility] told her they need a frequency before they will release the needles she need for insulin prescription. Pt said you can call her if you have any questions.

## 2020-02-07 NOTE — Addendum Note (Signed)
Addended by: Adair Laundry on: 02/07/2020 12:53 PM   Modules accepted: Orders

## 2020-02-09 ENCOUNTER — Telehealth: Payer: Self-pay | Admitting: Internal Medicine

## 2020-02-09 NOTE — Telephone Encounter (Signed)
Spoke with pharmacist at [facility] and the question was about the b12 needles that were sent in. I explained to pharmacist that those were sent in by accident, it should have been the pen needles. She stated that they have processed the pen needles to to be shipped out.

## 2020-02-09 NOTE — Telephone Encounter (Signed)
Noor calling from [place], [phone]. Has a question on the vitamin B12. Ref# [id number].

## 2020-02-19 DIAGNOSIS — J45909 Unspecified asthma, uncomplicated: Secondary | ICD-10-CM | POA: Diagnosis not present

## 2020-02-29 ENCOUNTER — Encounter: Payer: Self-pay | Admitting: Internal Medicine

## 2020-02-29 ENCOUNTER — Other Ambulatory Visit: Payer: Self-pay

## 2020-02-29 ENCOUNTER — Ambulatory Visit (INDEPENDENT_AMBULATORY_CARE_PROVIDER_SITE_OTHER): Payer: Medicare Other | Admitting: Internal Medicine

## 2020-02-29 DIAGNOSIS — E669 Obesity, unspecified: Secondary | ICD-10-CM | POA: Diagnosis not present

## 2020-02-29 DIAGNOSIS — K1379 Other lesions of oral mucosa: Secondary | ICD-10-CM

## 2020-02-29 DIAGNOSIS — E1169 Type 2 diabetes mellitus with other specified complication: Secondary | ICD-10-CM | POA: Diagnosis not present

## 2020-02-29 MED ORDER — TOUJEO MAX SOLOSTAR 300 UNIT/ML ~~LOC~~ SOPN: 40.0000 [IU] | PEN_INJECTOR | Freq: Every day | SUBCUTANEOUS | 1 refills | Status: DC

## 2020-02-29 NOTE — Progress Notes (Signed)
Medication Samples have been provided to the patient.  Drug name: Humalog       Strength: 100 units per mL        Qty: 1  LOT: [id number]  Exp.Date: 05/2021  Dosing instructions: do not start unless you have to increase to 40 units of Toujeo and your post prandial sugars are still greater than 160. Pt will then need to call office to see how many units she will need to start.   The patient has been instructed regarding the correct time, dose, and frequency of taking this medication, including desired effects and most common side effects.   Katie Huber 3:21 PM 02/29/2020

## 2020-02-29 NOTE — Patient Instructions (Addendum)
Increase the Toujeo    to 25 units  Today  And increase by 3 units every 3 days until F are < 150  And /or PP are < 160  If you get to 40 units daily with  PP's still high ,  We will add the humalog insulin at your biggest meal   Increase your vitamin D to 5000  Ius daily

## 2020-02-29 NOTE — Progress Notes (Signed)
Subjective:  Patient ID: Katie Huber, female    DOB: [birth year]  Age: 69 y.o. MRN: [id number]  CC: There were no encounter diagnoses.  HPI Katie Huber presents for diabetes follow up. 1 This visit occurred during the SARS-CoV-2 public health emergency.  Safety protocols were in place, including screening questions prior to the visit, additional usage of staff PPE, and extensive cleaning of exam room while observing appropriate contact time as indicated for disinfecting solutions.     At her last visit medication changes occurred: Januvia was too costly  So it was stopped and she started using Toujeo  20 units every morning with some loss of control as evidenced by her BS which she brought today: Sugars:  F 216  PP245   F 200  PP236   Most recent  F195.  She is following a moderate low GI diet about 75% of the time.  No lows. .  Not exercising regularly   2) Mouth pain:  She continues to report a constant dull aching pain in her left maxilla in the area where a prior root canal and crown was done 2 years ago .  She has had numerous evaluations including dentistry,  oral surgery and neurology , and ENT .  I am able to view only the neurology evaluation and  MRI done to rule out trigeminal neuralgia in January 2021  by Dr. Manuella Ghazi  Apparently dr Manuella Ghazi believes that the diagnosis is trigeminal neuralgia, non surgical , and has recommended medication to manage the pain, which she has declined in favor of ENT evaluation .  She is understandably frustrated by the lack of cure, but states that her pain is managed with tylenol and she is NOT requesting narcotics   Outpatient Medications Prior to Visit  Medication Sig Dispense Refill  . acyclovir (ZOVIRAX) 400 MG tablet TAKE ONE TABLET BY MOUTH  FIVE TIMES DAILY 450 tablet 1  . albuterol (PROVENTIL HFA;VENTOLIN HFA) 108 (90 Base) MCG/ACT inhaler Inhale 2 puffs into the lungs every 6 (six) hours as needed for wheezing or shortness of breath. 3 Inhaler 1    . albuterol (PROVENTIL) (2.5 MG/3ML) 0.083% nebulizer solution Take 3 mLs (2.5 mg total) by nebulization every 6 (six) hours as needed for wheezing or shortness of breath. 150 mL 1  . atorvastatin (LIPITOR) 20 MG tablet TAKE 1 TABLET BY MOUTH  DAILY 90 tablet 1  . furosemide (LASIX) 20 MG tablet TAKE 1 TABLET BY MOUTH  DAILY AS NEEDED FOR FLUID  RETENTION 90 tablet 1  . gabapentin (NEURONTIN) 300 MG capsule TAKE 2 CAPSULES BY MOUTH 3  TIMES DAILY 540 capsule 3  . glucose blood test strip OneTouch Verio Fex.    Test one time daily for steroid induced diabetes 100 each 0  . insulin glargine, 2 Unit Dial, (TOUJEO MAX SOLOSTAR) 300 UNIT/ML Solostar Pen Inject 20 Units into the skin daily. 15 mL 1  . Insulin Pen Needle (PEN NEEDLES) 32G X 6 MM MISC 1 application by Does not apply route daily. Use to inject insulin once daily. 90 each 3  . losartan (COZAAR) 100 MG tablet Take 1 tablet (100 mg total) by mouth daily. 90 tablet 1  . montelukast (SINGULAIR) 10 MG tablet TAKE 1 TABLET BY MOUTH AT  BEDTIME 90 tablet 3  . omeprazole (PRILOSEC) 20 MG capsule TAKE 1 CAPSULE BY MOUTH  TWICE DAILY 180 capsule 3  . PARoxetine (PAXIL) 20 MG tablet TAKE 1 TABLET BY MOUTH IN  THE  MORNING 90 tablet 3  . potassium chloride SA (K-DUR,KLOR-CON) 20 MEQ tablet TAKE 1 TABLET BY MOUTH  DAILY WHEN USING FUROSEMIDE 90 tablet 1  . Probiotic Product (PROBIOTIC DAILY PO) Take 1 capsule by mouth daily.    . promethazine (PHENERGAN) 25 MG tablet TAKE 1 TABLET BY MOUTH  EVERY 8 HOURS AS NEEDED FOR NAUSEA AND VOMITING 90 tablet 0  . triamterene-hydrochlorothiazide (MAXZIDE-25) 37.5-25 MG tablet TAKE 1 TABLET BY MOUTH  DAILY 90 tablet 0  . VITAMIN D, CHOLECALCIFEROL, PO Take 1 tablet by mouth daily.    Grant Ruts INHUB 250-50 MCG/DOSE AEPB USE 1 INHALATION BY MOUTH  INTO THE LUNGS TWO TIMES  DAILY 180 each 1  . zolpidem (AMBIEN) 10 MG tablet Take 1 tablet (10 mg total) by mouth at bedtime as needed. for sleep 90 tablet 1  . JANUVIA 50  MG tablet Take 50 mg by mouth daily.     Facility-Administered Medications Prior to Visit  Medication Dose Route Frequency Provider Last Rate Last Admin  . pneumococcal 13-valent conjugate vaccine (PREVNAR 13) injection 0.5 mL  0.5 mL Intramuscular Once [name], MD        Review of Systems;  Patient denies headache, fevers, malaise, unintentional weight loss, skin rash, eye pain, sinus congestion and sinus pain, sore throat, dysphagia,  hemoptysis , cough, dyspnea, wheezing, chest pain, palpitations, orthopnea, edema, abdominal pain, nausea, melena, diarrhea, constipation, flank pain, dysuria, hematuria, urinary  Frequency, nocturia, numbness, tingling, seizures,  Focal weakness, Loss of consciousness,  Tremor, insomnia, depression, anxiety, and suicidal ideation.      Objective:  BP (!) 158/86 (BP Location: Left Arm, Patient Position: Sitting, Cuff Size: Large)   Pulse 72   Temp (!) 97.2 F (36.2 C) (Temporal)   Resp 16   Ht 5\' 7"  (1.702 m)   Wt (!) 335 lb 12.8 oz (152.3 kg)   SpO2 96%   BMI 52.59 kg/m   BP Readings from Last 3 Encounters:  02/29/20 (!) 158/86  01/28/19 118/64  01/28/19 118/64    Wt Readings from Last 3 Encounters:  02/29/20 (!) 335 lb 12.8 oz (152.3 kg)  02/02/20 (!) 309 lb (140.2 kg)  01/09/20 (!) 309 lb (140.2 kg)    General appearance: alert, cooperative and appears stated age Ears: normal TM's and external ear canals both ears Throat: lips, mucosa, and tongue normal; teeth and gums normal Neck: no adenopathy, no carotid bruit, supple, symmetrical, trachea midline and thyroid not enlarged, symmetric, no tenderness/mass/nodules Back: symmetric, no curvature. ROM normal. No CVA tenderness. Lungs: clear to auscultation bilaterally Heart: regular rate and rhythm, S1, S2 normal, no murmur, click, rub or gallop Abdomen: soft, non-tender; bowel sounds normal; no masses,  no organomegaly Pulses: 2+ and symmetric Skin: Skin color, texture, turgor  normal. No rashes or lesions Lymph nodes: Cervical, supraclavicular, and axillary nodes normal.  Lab Results  Component Value Date   HGBA1C 8.4 (H) 01/30/2020   HGBA1C 6.4 (A) 01/28/2019   HGBA1C 7.9 (H) 07/26/2018    Lab Results  Component Value Date   CREATININE 0.60 01/30/2020   CREATININE 0.80 12/22/2019   CREATININE 0.60 01/24/2019    Lab Results  Component Value Date   WBC 4.5 01/30/2020   HGB 13.4 01/30/2020   HCT 40.1 01/30/2020   PLT 217.0 01/30/2020   GLUCOSE 221 (H) 01/30/2020   CHOL 152 01/30/2020   TRIG 99.0 01/30/2020   HDL 48.30 01/30/2020   LDLDIRECT 69.0 06/05/2017   LDLCALC 84 01/30/2020  ALT 14 01/30/2020   AST 15 01/30/2020   NA 136 01/30/2020   K 3.9 01/30/2020   CL 99 01/30/2020   CREATININE 0.60 01/30/2020   BUN 13 01/30/2020   CO2 29 01/30/2020   TSH 1.08 06/08/2014   HGBA1C 8.4 (H) 01/30/2020   MICROALBUR <0.7 01/28/2019    MR FACE/TRIGEMINAL WO/W CM  Result Date: 12/23/2019 CLINICAL DATA:  Left-sided oral cavity pain. EXAM: MRI FACE TRIGEMINAL WITHOUT AND WITH CONTRAST TECHNIQUE: Multiplanar, multiecho pulse sequences of the face and surrounding structures, including thin slice imaging of the course of the Trigeminal Nerves, were obtained both before and after administration of intravenous contrast. CONTRAST:  66mL GADAVIST GADOBUTROL 1 MMOL/ML IV SOLN COMPARISON:  None. FINDINGS: [place] is normal bilaterally. There is no abnormal contrast enhancement along the course of the trigeminal nerve on either side. Foramina rotundum and ovale are normal. The cisternal segments of the trigeminal nerves are normal. The visualized brain is normal. Normal orbits. Paranasal sinuses are clear. The visualized facial soft tissue structures are normal. No visible abnormality within the oral cavity. IMPRESSION: No abnormality along the course of either trigeminal nerve. Electronically Signed   By: Ulyses Jarred M.D.   On: 12/23/2019 00:12    Assessment &  Plan:   Problem List Items Addressed This Visit    None      I have discontinued Myra Magee's Januvia. I am also having her maintain her glucose blood, potassium chloride SA, albuterol, albuterol, Wixela Inhub, promethazine, furosemide, montelukast, PARoxetine, zolpidem, gabapentin, omeprazole, triamterene-hydrochlorothiazide, atorvastatin, acyclovir, Toujeo Max SoloStar, Probiotic Product (PROBIOTIC DAILY PO), (VITAMIN D, CHOLECALCIFEROL, PO), losartan, and Pen Needles. We will continue to administer pneumococcal 13-valent conjugate vaccine.  No orders of the defined types were placed in this encounter.   Medications Discontinued During This Encounter  Medication Reason  . JANUVIA 50 MG tablet Change in therapy    Follow-up: No follow-ups on file.   [name], MD

## 2020-03-02 NOTE — Assessment & Plan Note (Signed)
Not well controlled on Toujeo and metformin,  Increase toujeo dose to 25 units daily and incrementally increase by 3 units every 3 days until she reaches 40 units daily without control achieved,  Will add 5 units Humalog if needed for mealtime coverage

## 2020-03-02 NOTE — Assessment & Plan Note (Addendum)
Dx unclear,  She does not accept trigeminal neuralgia as the diagnosis (made by Manuella Ghazi) .  I have recommended a bone scan but she believes she has had one . Awaiting records from outside evaluations .  Increase vitamin D intake

## 2020-03-19 ENCOUNTER — Emergency Department: Payer: No Typology Code available for payment source

## 2020-03-19 ENCOUNTER — Other Ambulatory Visit: Payer: Self-pay

## 2020-03-19 ENCOUNTER — Emergency Department
Admission: EM | Admit: 2020-03-19 | Discharge: 2020-03-19 | Disposition: A | Discharge status: Home / Self Care | Payer: No Typology Code available for payment source | Attending: Student in an Organized Health Care Education/Training Program | Admitting: Student in an Organized Health Care Education/Training Program

## 2020-03-19 DIAGNOSIS — Y92410 Unspecified street and highway as the place of occurrence of the external cause: Secondary | ICD-10-CM | POA: Insufficient documentation

## 2020-03-19 DIAGNOSIS — E119 Type 2 diabetes mellitus without complications: Secondary | ICD-10-CM | POA: Insufficient documentation

## 2020-03-19 DIAGNOSIS — S40212A Abrasion of left shoulder, initial encounter: Secondary | ICD-10-CM | POA: Insufficient documentation

## 2020-03-19 DIAGNOSIS — Z794 Long term (current) use of insulin: Secondary | ICD-10-CM | POA: Diagnosis not present

## 2020-03-19 DIAGNOSIS — Y9389 Activity, other specified: Secondary | ICD-10-CM | POA: Insufficient documentation

## 2020-03-19 DIAGNOSIS — T148XXA Other injury of unspecified body region, initial encounter: Secondary | ICD-10-CM

## 2020-03-19 DIAGNOSIS — W2210XA Striking against or struck by unspecified automobile airbag, initial encounter: Secondary | ICD-10-CM | POA: Diagnosis not present

## 2020-03-19 DIAGNOSIS — J45909 Unspecified asthma, uncomplicated: Secondary | ICD-10-CM | POA: Insufficient documentation

## 2020-03-19 DIAGNOSIS — G44309 Post-traumatic headache, unspecified, not intractable: Secondary | ICD-10-CM | POA: Insufficient documentation

## 2020-03-19 DIAGNOSIS — S2001XA Contusion of right breast, initial encounter: Secondary | ICD-10-CM | POA: Diagnosis not present

## 2020-03-19 DIAGNOSIS — Y998 Other external cause status: Secondary | ICD-10-CM | POA: Diagnosis not present

## 2020-03-19 DIAGNOSIS — R519 Headache, unspecified: Secondary | ICD-10-CM | POA: Diagnosis not present

## 2020-03-19 DIAGNOSIS — I10 Essential (primary) hypertension: Secondary | ICD-10-CM | POA: Insufficient documentation

## 2020-03-19 DIAGNOSIS — S20211A Contusion of right front wall of thorax, initial encounter: Secondary | ICD-10-CM | POA: Insufficient documentation

## 2020-03-19 DIAGNOSIS — Z79899 Other long term (current) drug therapy: Secondary | ICD-10-CM | POA: Diagnosis not present

## 2020-03-19 DIAGNOSIS — S2020XA Contusion of thorax, unspecified, initial encounter: Secondary | ICD-10-CM | POA: Diagnosis not present

## 2020-03-19 DIAGNOSIS — R0902 Hypoxemia: Secondary | ICD-10-CM | POA: Diagnosis not present

## 2020-03-19 DIAGNOSIS — Z743 Need for continuous supervision: Secondary | ICD-10-CM | POA: Diagnosis not present

## 2020-03-19 DIAGNOSIS — S199XXA Unspecified injury of neck, initial encounter: Secondary | ICD-10-CM | POA: Diagnosis not present

## 2020-03-19 DIAGNOSIS — S299XXA Unspecified injury of thorax, initial encounter: Secondary | ICD-10-CM | POA: Diagnosis present

## 2020-03-19 LAB — BASIC METABOLIC PANEL
Anion gap: 15 (ref 5–15)
BUN: 23 mg/dL (ref 8–23)
CO2: 25 mmol/L (ref 22–32)
Calcium: 9.8 mg/dL (ref 8.9–10.3)
Chloride: 97 mmol/L — ABNORMAL LOW (ref 98–111)
Creatinine, Ser: 1.1 mg/dL — ABNORMAL HIGH (ref 0.44–1.00)
GFR calc Af Amer: 60 mL/min — ABNORMAL LOW (ref >60)
GFR calc non Af Amer: 52 mL/min — ABNORMAL LOW (ref >60)
Glucose, Bld: 199 mg/dL — ABNORMAL HIGH (ref 70–99)
Potassium: 4 mmol/L (ref 3.5–5.1)
Sodium: 137 mmol/L (ref 135–145)

## 2020-03-19 LAB — CBC
HCT: 42.3 % (ref 36.0–46.0)
Hemoglobin: 14.2 g/dL (ref 12.0–15.0)
MCH: 30.7 pg (ref 26.0–34.0)
MCHC: 33.6 g/dL (ref 30.0–36.0)
MCV: 91.6 fL (ref 80.0–100.0)
Platelets: 218 10*3/uL (ref 150–400)
RBC: 4.62 MIL/uL (ref 3.87–5.11)
RDW: 12.9 % (ref 11.5–15.5)
WBC: 7.6 10*3/uL (ref 4.0–10.5)
nRBC: 0 % (ref 0.0–0.2)

## 2020-03-19 MED ORDER — CYCLOBENZAPRINE HCL 5 MG PO TABS: 5.0000 mg | ORAL_TABLET | Freq: Three times a day (TID) | ORAL | 0 refills | Status: DC | PRN

## 2020-03-19 MED ORDER — OXYCODONE HCL 5 MG PO TABS
5.0000 mg | ORAL_TABLET | Freq: Once | ORAL | Status: AC
  Administered 2020-03-19: 5 mg via ORAL
  Filled 2020-03-19: qty 1

## 2020-03-19 MED ORDER — ACETAMINOPHEN 500 MG PO TABS
1000.0000 mg | ORAL_TABLET | Freq: Once | ORAL | Status: AC
  Administered 2020-03-19: 1000 mg via ORAL

## 2020-03-19 MED ORDER — HYDROCODONE-ACETAMINOPHEN 5-325 MG PO TABS: 1.0000 | ORAL_TABLET | Freq: Three times a day (TID) | ORAL | 0 refills | Status: AC | PRN

## 2020-03-19 MED ORDER — CYCLOBENZAPRINE HCL 10 MG PO TABS
10.0000 mg | ORAL_TABLET | Freq: Once | ORAL | Status: AC
  Administered 2020-03-19: 10 mg via ORAL
  Filled 2020-03-19: qty 1

## 2020-03-19 MED ORDER — IOHEXOL 300 MG/ML  SOLN
75.0000 mL | Freq: Once | INTRAMUSCULAR | Status: AC | PRN
  Administered 2020-03-19: 19:00:00 75 mL via INTRAVENOUS

## 2020-03-19 MED ORDER — ACETAMINOPHEN 500 MG PO TABS
ORAL_TABLET | ORAL | Status: AC
  Filled 2020-03-19: qty 2
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
## 2020-03-20 ENCOUNTER — Other Ambulatory Visit: Payer: Self-pay | Admitting: Internal Medicine

## 2020-03-20 MED ORDER — INSULIN NPH (HUMAN) (ISOPHANE) 100 UNIT/ML ~~LOC~~ SUSP: 10.0000 [IU] | Freq: Every day | SUBCUTANEOUS | 11 refills | Status: DC

## 2020-03-21 DIAGNOSIS — J45909 Unspecified asthma, uncomplicated: Secondary | ICD-10-CM | POA: Diagnosis not present

## 2020-03-23 MED ORDER — "INSULIN SYRINGE 27G X 1/2"" 1 ML MISC": 2 refills | Status: DC

## 2020-03-23 NOTE — Telephone Encounter (Signed)
Pt received vials but no way to inject it. Please advise

## 2020-04-06 ENCOUNTER — Telehealth: Payer: Self-pay | Admitting: Internal Medicine

## 2020-04-06 MED ORDER — PEN NEEDLES 32G X 6 MM MISC: 1.0000 "application " | Freq: Every day | 3 refills | Status: DC

## 2020-04-06 NOTE — Telephone Encounter (Signed)
Medication has been refilled.

## 2020-04-06 NOTE — Telephone Encounter (Signed)
Pt needs refill on Insulin Pen Needle (PEN NEEDLES) 32G X 6 MM MISC Sent to [facility]  They are say there is no more refills

## 2020-04-19 ENCOUNTER — Telehealth: Payer: Self-pay

## 2020-04-19 NOTE — Telephone Encounter (Signed)
Called lmom informing patient of appointment on 04/23/2020. klh

## 2020-04-19 NOTE — Telephone Encounter (Signed)
Confirmed appointment on 04/23/2020 and screened for covid. klh 

## 2020-04-23 ENCOUNTER — Encounter: Payer: Self-pay | Admitting: Internal Medicine

## 2020-04-23 ENCOUNTER — Ambulatory Visit: Payer: Medicare Other | Admitting: Internal Medicine

## 2020-04-23 VITALS — BP 171/75 | HR 68 | Temp 97.6°F | Ht 67.0 in | Wt 334.0 lb

## 2020-04-23 DIAGNOSIS — G4733 Obstructive sleep apnea (adult) (pediatric): Secondary | ICD-10-CM | POA: Diagnosis not present

## 2020-04-23 DIAGNOSIS — J4550 Severe persistent asthma, uncomplicated: Secondary | ICD-10-CM

## 2020-04-23 DIAGNOSIS — R0602 Shortness of breath: Secondary | ICD-10-CM | POA: Diagnosis not present

## 2020-04-23 DIAGNOSIS — Z9989 Dependence on other enabling machines and devices: Secondary | ICD-10-CM | POA: Diagnosis not present

## 2020-04-23 DIAGNOSIS — G4700 Insomnia, unspecified: Secondary | ICD-10-CM

## 2020-04-23 MED ORDER — ZOLPIDEM TARTRATE 10 MG PO TABS: 10.0000 mg | ORAL_TABLET | Freq: Every evening | ORAL | 1 refills | Status: DC | PRN
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
## 2020-05-15 ENCOUNTER — Encounter: Payer: Self-pay | Admitting: Internal Medicine

## 2020-05-15 NOTE — Telephone Encounter (Signed)
I don't see this patient but can we do this for her.

## 2020-05-21 DIAGNOSIS — Z7984 Long term (current) use of oral hypoglycemic drugs: Secondary | ICD-10-CM | POA: Diagnosis not present

## 2020-05-21 DIAGNOSIS — H5203 Hypermetropia, bilateral: Secondary | ICD-10-CM | POA: Diagnosis not present

## 2020-05-21 DIAGNOSIS — E119 Type 2 diabetes mellitus without complications: Secondary | ICD-10-CM | POA: Diagnosis not present

## 2020-05-21 DIAGNOSIS — H2513 Age-related nuclear cataract, bilateral: Secondary | ICD-10-CM | POA: Diagnosis not present

## 2020-05-21 DIAGNOSIS — H52223 Regular astigmatism, bilateral: Secondary | ICD-10-CM | POA: Diagnosis not present

## 2020-05-21 LAB — HM DIABETES EYE EXAM

## 2020-05-21 NOTE — Telephone Encounter (Signed)
Was this done?

## 2020-05-21 NOTE — Telephone Encounter (Signed)
This keeps coming to me. I have never seen this patient before. Do you know if adam or DSK have seen her recently?

## 2020-06-01 ENCOUNTER — Encounter: Payer: Self-pay | Admitting: Internal Medicine

## 2020-06-19 ENCOUNTER — Other Ambulatory Visit: Payer: Self-pay | Admitting: Internal Medicine

## 2020-06-21 ENCOUNTER — Other Ambulatory Visit: Payer: Self-pay

## 2020-06-21 MED ORDER — PROMETHAZINE HCL 25 MG PO TABS: ORAL_TABLET | ORAL | 0 refills | Status: DC

## 2020-06-21 MED ORDER — POTASSIUM CHLORIDE CRYS ER 20 MEQ PO TBCR: EXTENDED_RELEASE_TABLET | ORAL | 1 refills | Status: DC

## 2020-06-21 MED ORDER — TRIAMTERENE-HCTZ 37.5-25 MG PO TABS: 1.0000 | ORAL_TABLET | Freq: Every day | ORAL | 0 refills | Status: DC

## 2020-07-24 ENCOUNTER — Ambulatory Visit: Payer: Medicare Other | Admitting: Internal Medicine

## 2020-07-25 ENCOUNTER — Encounter: Payer: Self-pay | Admitting: Hospice and Palliative Medicine

## 2020-07-25 ENCOUNTER — Ambulatory Visit (INDEPENDENT_AMBULATORY_CARE_PROVIDER_SITE_OTHER): Payer: Medicare Other | Admitting: Hospice and Palliative Medicine

## 2020-07-25 ENCOUNTER — Other Ambulatory Visit: Payer: Self-pay

## 2020-07-25 DIAGNOSIS — G4734 Idiopathic sleep related nonobstructive alveolar hypoventilation: Secondary | ICD-10-CM

## 2020-07-25 DIAGNOSIS — G4733 Obstructive sleep apnea (adult) (pediatric): Secondary | ICD-10-CM | POA: Diagnosis not present

## 2020-07-25 DIAGNOSIS — G4700 Insomnia, unspecified: Secondary | ICD-10-CM

## 2020-07-25 DIAGNOSIS — J4550 Severe persistent asthma, uncomplicated: Secondary | ICD-10-CM

## 2020-07-25 DIAGNOSIS — I10 Essential (primary) hypertension: Secondary | ICD-10-CM

## 2020-07-25 DIAGNOSIS — Z9989 Dependence on other enabling machines and devices: Secondary | ICD-10-CM

## 2020-07-25 DIAGNOSIS — R0602 Shortness of breath: Secondary | ICD-10-CM
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
## 2020-07-26 NOTE — Patient Instructions (Signed)

## 2020-08-09 ENCOUNTER — Telehealth: Payer: Self-pay

## 2020-08-09 NOTE — Telephone Encounter (Signed)
Gave [facility] for cpap supplies. Katie Huber

## 2020-08-16 DIAGNOSIS — G4733 Obstructive sleep apnea (adult) (pediatric): Secondary | ICD-10-CM | POA: Diagnosis not present

## 2020-08-27 ENCOUNTER — Telehealth: Payer: Self-pay | Admitting: Internal Medicine

## 2020-08-27 NOTE — Telephone Encounter (Signed)
Patient called in wanted a order for fasting lab before her visit on 09-03-20

## 2020-08-28 ENCOUNTER — Telehealth: Payer: Self-pay | Admitting: *Deleted

## 2020-08-28 DIAGNOSIS — E669 Obesity, unspecified: Secondary | ICD-10-CM

## 2020-08-28 DIAGNOSIS — E785 Hyperlipidemia, unspecified: Secondary | ICD-10-CM

## 2020-08-28 NOTE — Telephone Encounter (Signed)
Done. thanks

## 2020-08-28 NOTE — Telephone Encounter (Signed)
Please place future orders for lab appt.  

## 2020-08-29 ENCOUNTER — Other Ambulatory Visit (INDEPENDENT_AMBULATORY_CARE_PROVIDER_SITE_OTHER): Payer: Medicare Other

## 2020-08-29 ENCOUNTER — Other Ambulatory Visit: Payer: Self-pay

## 2020-08-29 DIAGNOSIS — E669 Obesity, unspecified: Secondary | ICD-10-CM

## 2020-08-29 DIAGNOSIS — E785 Hyperlipidemia, unspecified: Secondary | ICD-10-CM | POA: Diagnosis not present

## 2020-08-29 DIAGNOSIS — E1169 Type 2 diabetes mellitus with other specified complication: Secondary | ICD-10-CM | POA: Diagnosis not present

## 2020-08-29 LAB — LIPID PANEL
Cholesterol: 151 mg/dL (ref 0–200)
HDL: 58.4 mg/dL (ref >39.00)
LDL Cholesterol: 70 mg/dL (ref 0–99)
NonHDL: 92.96
Total CHOL/HDL Ratio: 3
Triglycerides: 115 mg/dL (ref 0.0–149.0)
VLDL: 23 mg/dL (ref 0.0–40.0)

## 2020-08-29 LAB — COMPREHENSIVE METABOLIC PANEL
ALT: 46 U/L — ABNORMAL HIGH (ref 0–35)
AST: 21 U/L (ref 0–37)
Albumin: 4.2 g/dL (ref 3.5–5.2)
Alkaline Phosphatase: 87 U/L (ref 39–117)
BUN: 21 mg/dL (ref 6–23)
CO2: 28 mEq/L (ref 19–32)
Calcium: 10.1 mg/dL (ref 8.4–10.5)
Chloride: 101 mEq/L (ref 96–112)
Creatinine, Ser: 0.66 mg/dL (ref 0.40–1.20)
GFR: 88.72 mL/min (ref >60.00)
Glucose, Bld: 132 mg/dL — ABNORMAL HIGH (ref 70–99)
Potassium: 4.1 mEq/L (ref 3.5–5.1)
Sodium: 137 mEq/L (ref 135–145)
Total Bilirubin: 0.6 mg/dL (ref 0.2–1.2)
Total Protein: 6.7 g/dL (ref 6.0–8.3)

## 2020-08-29 LAB — HEMOGLOBIN A1C: Hgb A1c MFr Bld: 6.6 % — ABNORMAL HIGH (ref 4.6–6.5)

## 2020-08-29 NOTE — Addendum Note (Signed)
Addended by: Tor Netters I on: 08/29/2020 03:15 PM   Modules accepted: Orders

## 2020-08-29 NOTE — Telephone Encounter (Signed)
Pt came in this morning and had her labs drawn.

## 2020-08-29 NOTE — Addendum Note (Signed)
Addended by: Tor Netters I on: 08/29/2020 03:12 PM   Modules accepted: Orders

## 2020-09-01 NOTE — Progress Notes (Signed)
Attached are your recent fasting labs,  We will discuss in detail at your upcoming visit. Nice work o n the A1c!!   Regards,   Deborra Medina, MD

## 2020-09-03 ENCOUNTER — Ambulatory Visit (INDEPENDENT_AMBULATORY_CARE_PROVIDER_SITE_OTHER): Payer: Medicare Other | Admitting: Internal Medicine

## 2020-09-03 ENCOUNTER — Other Ambulatory Visit: Payer: Self-pay

## 2020-09-03 ENCOUNTER — Encounter: Payer: Self-pay | Admitting: Internal Medicine

## 2020-09-03 VITALS — BP 148/88 | HR 71 | Temp 98.1°F | Resp 16 | Ht 67.0 in | Wt 332.2 lb

## 2020-09-03 DIAGNOSIS — I10 Essential (primary) hypertension: Secondary | ICD-10-CM | POA: Diagnosis not present

## 2020-09-03 DIAGNOSIS — E669 Obesity, unspecified: Secondary | ICD-10-CM | POA: Diagnosis not present

## 2020-09-03 DIAGNOSIS — Z23 Encounter for immunization: Secondary | ICD-10-CM

## 2020-09-03 DIAGNOSIS — E1169 Type 2 diabetes mellitus with other specified complication: Secondary | ICD-10-CM | POA: Diagnosis not present

## 2020-09-03 DIAGNOSIS — E785 Hyperlipidemia, unspecified: Secondary | ICD-10-CM

## 2020-09-03 NOTE — Patient Instructions (Addendum)
Continue your current regimen   You will receive a cal from [name], PharmD to discuss alternative regimens that may help with weight loss AND GET YOU  The Continuous blood glucose monitor  Check BP at home and send me 5 daily readings  (BP was high today)
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
## 2020-09-04 ENCOUNTER — Telehealth: Payer: Self-pay

## 2020-09-04 LAB — MICROALBUMIN / CREATININE URINE RATIO
Creatinine,U: 26 mg/dL
Microalb Creat Ratio: 2.7 mg/g (ref 0.0–30.0)
Microalb, Ur: <0.7 mg/dL (ref 0.0–1.9)

## 2020-09-04 NOTE — Assessment & Plan Note (Signed)
History of bariatric surgery remotely..  Currently following a low GI diet but not exercising.  ozempic discussed.  CCM referral made.

## 2020-09-04 NOTE — Assessment & Plan Note (Signed)
she reports compliance with medication regimen  but has an elevated reading today in office.  She is not using NSAIDs daily.  Discussed goal of 120/70  (130/80 for patients over 70)  to preserve renal function.  She has been asked to check her  BP  at home and  submit readings for evaluation. Renal function, electrolytes and screen for proteinuria are all normal . 

## 2020-09-04 NOTE — Assessment & Plan Note (Signed)
Based on untreated  lipid profile, the risk of clinically significant CAD is 17% over the next 10 years.  She has been taking Lipitor 20 mg daily  with good tolerance and excellent results.  No changes today Lab Results  Component Value Date   CHOL 151 08/29/2020   HDL 58.40 08/29/2020   LDLCALC 70 08/29/2020   LDLDIRECT 69.0 06/05/2017   TRIG 115.0 08/29/2020   CHOLHDL 3 08/29/2020   Lab Results  Component Value Date   ALT 46 (H) 08/29/2020   AST 21 08/29/2020   ALKPHOS 87 08/29/2020   BILITOT 0.6 08/29/2020

## 2020-09-04 NOTE — Chronic Care Management (AMB) (Signed)
  Chronic Care Management   Outreach Note  09/04/2020 Name: Ryan Palermo MRN: [id number] DOB: 04-Jun-1951  Cielle Aguila is a 69 y.o. year old female who is a primary care patient of [name], MD. I reached out to Leda Quail by phone today in response to a referral sent by Ms. Charlena Cross PCP, Dr. Derrel Nip     An unsuccessful telephone outreach was attempted today. The patient was referred to the case management team for assistance with care management and care coordination.   Follow Up Plan: A HIPAA compliant phone message was left for the patient providing contact information and requesting a return call.  The care management team will reach out to the patient again over the next 7 days.  If patient returns call to provider office, please advise to call [facility] at [place], [place], [place], [place] [address] Direct Dial: [phone] [email or web address] Website: [email or web address]

## 2020-09-04 NOTE — Chronic Care Management (AMB) (Signed)
  Chronic Care Management   Note  09/04/2020 Name: Isla Sabree MRN: [id number] DOB: 08-02-1951  Allene Furuya is a 69 y.o. year old female who is a primary care patient of [name], MD. I reached out to Leda Quail by phone today in response to a referral sent by Ms. Katrice Sliney's PCP, Dr.Tullo     Ms. Imler was given information about Chronic Care Management services today including:  1. CCM service includes personalized support from designated clinical staff supervised by her physician, including individualized plan of care and coordination with other care providers 2. 24/7 contact phone numbers for assistance for urgent and routine care needs. 3. Service will only be billed when office clinical staff spend 20 minutes or more in a month to coordinate care. 4. Only one practitioner may furnish and bill the service in a calendar month. 5. The patient may stop CCM services at any time (effective at the end of the month) by phone call to the office staff. 6. The patient will be responsible for cost sharing (co-pay) of up to 20% of the service fee (after annual deductible is met).  Patient agreed to services and verbal consent obtained.   Follow up plan: Telephone appointment with care management team member scheduled for:10/20/2021Pharm D   Noreene Larsson, [place], [place], [place] [address] Direct Dial: [phone] [email or web address] Website: [email or web address]

## 2020-09-04 NOTE — Assessment & Plan Note (Signed)
Significant improvement with dietary adherence and medication adherence . Patient is up-to-date on eye exams and foot exam is normal today. Patient has no microalbuminuria. Patient is tolerating statin therapy for CAD risk reduction and on ACE/ARB for renal protection and hypertension   Lab Results  Component Value Date   HGBA1C 6.6 (H) 08/29/2020   Lab Results  Component Value Date   MICROALBUR <0.7 09/03/2020

## 2020-09-17 ENCOUNTER — Other Ambulatory Visit: Payer: Self-pay | Admitting: Internal Medicine

## 2020-09-19 ENCOUNTER — Telehealth: Payer: Medicare Other

## 2020-09-20 ENCOUNTER — Ambulatory Visit (INDEPENDENT_AMBULATORY_CARE_PROVIDER_SITE_OTHER): Payer: Medicare Other | Admitting: Pharmacist

## 2020-09-20 ENCOUNTER — Telehealth: Payer: Medicare Other

## 2020-09-20 DIAGNOSIS — Z9884 Bariatric surgery status: Secondary | ICD-10-CM

## 2020-09-20 DIAGNOSIS — E669 Obesity, unspecified: Secondary | ICD-10-CM

## 2020-09-20 DIAGNOSIS — E785 Hyperlipidemia, unspecified: Secondary | ICD-10-CM

## 2020-09-20 DIAGNOSIS — E1169 Type 2 diabetes mellitus with other specified complication: Secondary | ICD-10-CM | POA: Diagnosis not present

## 2020-09-20 NOTE — Patient Instructions (Signed)
Katie Huber,   It was great talking with you today!  Let's try metformin XR 500 mg once daily. Take this with food. STOP the insulin NPH. For now, continue Toujeo 40 units daily.   We are going to apply for patient assistance for Ozempic and Tyler Aas (a long acting insulin like Toujeo). Be on the lookout for a letter from my pharmacy technician, Sharee Pimple. She will mail you the pages of the application that we need you to fill out.   Call me with any questions!  Katie Huber, PharmD [phone]  Visit Information  Goals Addressed              This Visit's Progress     Patient Stated   .  PharmD "I want to lose weight" (pt-stated)        CARE PLAN ENTRY (see longitudinal plan of care for additional care plan information)  Current Barriers:  . Social, financial, community barriers:   o Reports medication cost concerns. Insulins >$300.  o Feels like she cannot lose weight . Diabetes: controlled, but not optimally managed; complicated by chronic medical conditions including obesity, hx bariatric surgery, HTN, HLD, most recent A1c 6.6% o Hx diarrhea w/ metformin IR . Most recent eGFR: 88 mL/min . Current antihyperglycemic regimen: Toujeo 40 units QAM, insulin NPH 10 units with supper . Denies hypoglycemic symptoms, including dizziness, lightheadedness, shaking, sweating . Current blood glucose readings:  o Fastings ~100-130s . Cardiovascular risk reduction: o Current hypertensive regimen: losartan 100 mg, triamterene/HCTZ 37.5/25 mg; has not been checking BP at home as instructed by PCP. Attempted to order BP machine through OTC benefits, but they were out of stock o Current hyperlipidemia regimen: atorvastatin 20 mg daily; LDL right at goal 70 . Asthma: reports that she has not required Wixela since montelukast 10 mg daily. Albuterol HFA ~1-2 times monthly. Albuterol nebulizer if respiratory infection . Cervicalgia: gabapentin 600 mg TID  Pharmacist Clinical Goal(s):  [name] Over the  next 90 days, patient will work with PharmD and primary care provider to address optimized medication management  Interventions: . Comprehensive medication review performed, medication list updated in electronic medical record . Inter-disciplinary care team collaboration (see longitudinal plan of care) . Reviewed goal A1c, goal fasting, and goal 2 hours post prandial glucoses.  . Reviewed duplicative pharmacokinetics of NPH insulin and Toujeo. D/c NPH.  [name] Reviewed reduced risk of GI upset w/ XR metformin vs IR. Patient amenable to retrial metformin XR 500 mg daily. Take with a meal.  . Discussed benefit of GLP1 to help w/ weight loss, minimize insulin need. Patient amenable.  . Reviewed income. She meets criteria for [employer] patient assistance. Will apply for Ozempic and Tresiba U200 (change from [place]). Will collaborate w/ PCP for signature on applications, will collaborate w/ CPhT to mail patient portion to patient.   Patient Self Care Activities:  . Patient will check blood glucose BID , document, and provide at future appointments . Patient will take medications as prescribed . Patient will contact provider with any episodes of hypoglycemia . Patient will report any questions or concerns to provider   Initial goal documentation        Ms. Printup was given information about Chronic Care Management services today including:  1. CCM service includes personalized support from designated clinical staff supervised by her physician, including individualized plan of care and coordination with other care providers 2. 24/7 contact phone numbers for assistance for urgent and routine care needs. 3. Service will  only be billed when office clinical staff spend 20 minutes or more in a month to coordinate care. 4. Only one practitioner may furnish and bill the service in a calendar month. 5. The patient may stop CCM services at any time (effective at the end of the month) by phone call to the  office staff. 6. The patient will be responsible for cost sharing (co-pay) of up to 20% of the service fee (after annual deductible is met).  Patient agreed to services and verbal consent obtained.   The patient verbalized understanding of instructions provided today and agreed to receive a mailed copy of patient instruction and/or educational materials.   Plan: - Scheduled f/u call in ~ 5 weeks  Katie Huber, PharmD, [place], [place] Pharmacist [place] [phone]

## 2020-09-20 NOTE — Chronic Care Management (AMB) (Signed)
Chronic Care Management   Note  09/20/2020 Name: Frady Taddeo MRN: [id number] DOB: [birth year]   Subjective:  Katie Huber is a 70 y.o. year old female who is a primary care patient of Tullo, Aris Everts, MD. The CCM team was consulted for assistance with chronic disease management and care coordination needs.     Contacted patient for initial medication access and medication management support  Ms. Escalante was given information about Chronic Care Management services today including:  1. CCM service includes personalized support from designated clinical staff supervised by her physician, including individualized plan of care and coordination with other care providers 2. 24/7 contact phone numbers for assistance for urgent and routine care needs. 3. Service will only be billed when office clinical staff spend 20 minutes or more in a month to coordinate care. 4. Only one practitioner may furnish and bill the service in a calendar month. 5. The patient may stop CCM services at any time (effective at the end of the month) by phone call to the office staff. 6. The patient will be responsible for cost sharing (co-pay) of up to 20% of the service fee (after annual deductible is met).  Patient agreed to services and verbal consent obtained.   Review of patient status, including review of consultants reports, laboratory and other test data, was performed as part of comprehensive evaluation and provision of chronic care management services.   SDOH (Social Determinants of Health) assessments and interventions performed:  SDOH Interventions     Most Recent Value  SDOH Interventions  Financial Strain Interventions Other (Comment)  [manufacturer assistance]       Objective:  Lab Results  Component Value Date   CREATININE 0.66 08/29/2020   CREATININE 1.10 (H) 03/19/2020   CREATININE 0.60 01/30/2020    Lab Results  Component Value Date   HGBA1C 6.6 (H) 08/29/2020       Component  Value Date/Time   CHOL 151 08/29/2020 1130   TRIG 115.0 08/29/2020 1130   HDL 58.40 08/29/2020 1130   CHOLHDL 3 08/29/2020 1130   VLDL 23.0 08/29/2020 1130   LDLCALC 70 08/29/2020 1130   LDLDIRECT 69.0 06/05/2017 0942    Clinical ASCVD: No  The 10-year ASCVD risk score Mikey Bussing DC Jr., et al., 2013) is: 24.3%   Values used to calculate the score:     Age: 28 years     Sex: Female     Is Non-Hispanic African American: No     Diabetic: Yes     Tobacco smoker: No     Systolic Blood Pressure: 382 mmHg     Is BP treated: Yes     HDL Cholesterol: 58.4 mg/dL     Total Cholesterol: 151 mg/dL    BP Readings from Last 3 Encounters:  09/03/20 (!) 148/88  07/25/20 (!) 150/63  04/23/20 (!) 171/75    No Known Allergies  Medications Reviewed Today    Reviewed by De Hollingshead, RPH-CPP (Pharmacist) on 09/20/20 at 1438  Med List Status: <None>  Medication Order Taking? Sig Documenting Provider Last Dose Status Informant  acyclovir (ZOVIRAX) 400 MG tablet [id number] No TAKE ONE TABLET BY MOUTH  FIVE TIMES DAILY  Patient not taking: Reported on 09/03/2020   [name], MD Not Taking Active   albuterol (PROVENTIL HFA;VENTOLIN HFA) 108 (90 Base) MCG/ACT inhaler [id number] No Inhale 2 puffs into the lungs every 6 (six) hours as needed for wheezing or shortness of breath.  Patient not taking: Reported on 09/20/2020  Kendell Bane, NP Not Taking Active   albuterol (PROVENTIL) (2.5 MG/3ML) 0.083% nebulizer solution [id number] No Take 3 mLs (2.5 mg total) by nebulization every 6 (six) hours as needed for wheezing or shortness of breath.  Patient not taking: Reported on 09/20/2020   Kendell Bane, NP Not Taking Active   atorvastatin (LIPITOR) 20 MG tablet [id number] Yes TAKE ONE TABLET EVERY DAY [name], MD Taking Active   cyclobenzaprine (FLEXERIL) 5 MG tablet [id number] No Take 1 tablet (5 mg total) by mouth 3 (three) times daily as needed.  Patient not taking: Reported on  09/20/2020   Melvenia Needles, PA-C Not Taking Active   furosemide (LASIX) 20 MG tablet [id number] Yes TAKE ONE TABLET EVERY DAY AS NEEDED FOR FLUID RETENTION [name], MD Taking Active   gabapentin (NEURONTIN) 300 MG capsule [id number] Yes TAKE 2 CAPSULES BY MOUTH 3  TIMES DAILY [name], MD Taking Active   glucose blood test strip [id number] Yes OneTouch Verio Fex.    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 [name], MD Taking Active   Insulin Pen Needle (PEN NEEDLES) 32G X 6 MM MISC [id number] Yes 1 application by Does not apply route daily. Use to inject insulin once daily. [name], MD Taking Active   Insulin Syringe 27G X 1/2" 1 ML MISC [id number] Yes Use to inject insulin as directed. [name], MD Taking Active   losartan (COZAAR) 100 MG tablet [id number] Yes TAKE ONE TABLET EVERY DAY [name], MD Taking Active   montelukast (SINGULAIR) 10 MG tablet [id number] Yes TAKE 1 TABLET BY MOUTH AT  BEDTIME [name], MD Taking Active   omeprazole (PRILOSEC) 20 MG capsule [id number] Yes TAKE 1 CAPSULE BY MOUTH  TWICE DAILY [name], MD Taking Active   PARoxetine (PAXIL) 20 MG tablet [id number] Yes TAKE 1 TABLET BY MOUTH IN  THE MORNING [name], MD Taking Active   pneumococcal 13-valent conjugate vaccine (PREVNAR 13) injection 0.5 mL [id number]   [name], MD  Consider Medication Status and Discontinue (Patient Preference)   potassium chloride SA (KLOR-CON) 20 MEQ tablet [id number] No TAKE 1 TABLET BY MOUTH  DAILY WHEN USING FUROSEMIDE  Patient not taking: Reported on 09/20/2020   [name], MD Not Taking Active   Probiotic Product (PROBIOTIC DAILY PO) [id number] Yes Take 1 capsule by mouth daily. [provider] Taking Active Self  promethazine  (PHENERGAN) 25 MG tablet [id number] Yes TAKE 1 TABLET BY MOUTH  EVERY 8 HOURS AS NEEDED FOR NAUSEA AND VOMITING [name], MD Taking Active   TOUJEO MAX SOLOSTAR 300 UNIT/ML Solostar Pen [id number] Yes INJECT 40 UNITS UNDER THE SKIN DAILY [name], MD Taking Active   triamterene-hydrochlorothiazide (MAXZIDE-25) 37.5-25 MG tablet [id number] Yes TAKE ONE TABLET BY MOUTH EVERY DAY [name], MD Taking Active   VITAMIN D, CHOLECALCIFEROL, PO [id number] Yes Take 1 tablet by mouth daily. [provider] Taking Active Self  WIXELA INHUB 250-50 MCG/DOSE AEPB [id number] No USE 1 INHALATION BY MOUTH  INTO THE LUNGS TWO TIMES  DAILY  Patient not taking: Reported on 09/20/2020   [name], MD Not Taking Active   zolpidem (AMBIEN) 10 MG tablet [id number] Yes Take 1 tablet (10 mg  total) by mouth at bedtime as needed. for sleep Kendell Bane, NP Taking Active            Assessment:   Goals Addressed              This Visit's Progress     Patient Stated     PharmD "I want to lose weight" (pt-stated)        CARE PLAN ENTRY (see longitudinal plan of care for additional care plan information)  Current Barriers:   Social, financial, community barriers:   o Reports medication cost concerns. Insulins >$300.  o Feels like she cannot lose weight  Diabetes: controlled, but not optimally managed; complicated by chronic medical conditions including obesity, hx bariatric surgery, HTN, HLD, most recent A1c 6.6% o Hx diarrhea w/ metformin IR  Most recent eGFR: 88 mL/min  Current antihyperglycemic regimen: Toujeo 40 units QAM, insulin NPH 10 units with supper  Denies hypoglycemic symptoms, including dizziness, lightheadedness, shaking, sweating  Current blood glucose readings:  o Fastings ~100-130s  Cardiovascular risk reduction: o Current hypertensive regimen: losartan 100 mg, triamterene/HCTZ 37.5/25 mg; has not been checking BP at home as instructed by PCP.  Attempted to order BP machine through OTC benefits, but they were out of stock o Current hyperlipidemia regimen: atorvastatin 20 mg daily; LDL right at goal 70  Asthma: reports that she has not required Wixela since montelukast 10 mg daily. Albuterol HFA ~1-2 times monthly. Albuterol nebulizer if respiratory infection  Cervicalgia: gabapentin 600 mg TID  Pharmacist Clinical Goal(s):   Over the next 90 days, patient will work with PharmD and primary care provider to address optimized medication management  Interventions:  Comprehensive medication review performed, medication list updated in electronic medical record  Inter-disciplinary care team collaboration (see longitudinal plan of care)  Reviewed goal A1c, goal fasting, and goal 2 hours post prandial glucoses.   Reviewed duplicative pharmacokinetics of NPH insulin and Toujeo. D/c NPH.   Reviewed reduced risk of GI upset w/ XR metformin vs IR. Patient amenable to retrial metformin XR 500 mg daily. Take with a meal.   Discussed benefit of GLP1 to help w/ weight loss, minimize insulin need. Patient amenable.   Reviewed income. She meets criteria for [employer] patient assistance. Will apply for Ozempic and Tresiba U200 (change from [place]). Will collaborate w/ PCP for signature on applications, will collaborate w/ CPhT to mail patient portion to patient.   Patient Self Care Activities:   Patient will check blood glucose BID , document, and provide at future appointments  Patient will take medications as prescribed  Patient will contact provider with any episodes of hypoglycemia  Patient will report any questions or concerns to provider   Initial goal documentation        Plan: - Scheduled f/u call in ~ 5 weeks  [name], PharmD, [place], CPP Clinical Pharmacist [place] [phone]

## 2020-09-24 ENCOUNTER — Telehealth: Payer: Self-pay | Admitting: Internal Medicine

## 2020-09-24 DIAGNOSIS — E1169 Type 2 diabetes mellitus with other specified complication: Secondary | ICD-10-CM

## 2020-09-24 DIAGNOSIS — E669 Obesity, unspecified: Secondary | ICD-10-CM

## 2020-09-24 MED ORDER — METFORMIN HCL ER 500 MG PO TB24: ORAL_TABLET | ORAL | 2 refills | Status: DC

## 2020-09-24 NOTE — Progress Notes (Signed)
Metformin XR script sent

## 2020-09-24 NOTE — Telephone Encounter (Signed)
Spoke with pt to get clarification and she stated that at her last phone call with you she was told to stop the night time NPH insulin and start metformin XL. Pt stated that the pharmacy does not have a prescription for that medication.

## 2020-09-24 NOTE — Telephone Encounter (Signed)
Patient called to check on medication that Dr.Tullo was suppose to prescribe for her on  last visit.

## 2020-10-10 ENCOUNTER — Ambulatory Visit: Payer: Medicare Other | Admitting: Pharmacist

## 2020-10-10 DIAGNOSIS — E1169 Type 2 diabetes mellitus with other specified complication: Secondary | ICD-10-CM

## 2020-10-10 DIAGNOSIS — E669 Obesity, unspecified: Secondary | ICD-10-CM

## 2020-10-10 MED ORDER — OZEMPIC (0.25 OR 0.5 MG/DOSE) 2 MG/1.5ML ~~LOC~~ SOPN: 0.5000 mg | PEN_INJECTOR | SUBCUTANEOUS | 3 refills | Status: DC

## 2020-10-10 MED ORDER — TOUJEO MAX SOLOSTAR 300 UNIT/ML ~~LOC~~ SOPN: 30.0000 [IU] | PEN_INJECTOR | Freq: Every day | SUBCUTANEOUS | 1 refills | Status: DC

## 2020-10-10 NOTE — Patient Instructions (Signed)
Katie Huber,   Start Ozempic 0.25 mg once weekly x 4 weeks, then increase to 0.5 mg weekly. Decrease your daily insulin to 30 units daily. If you develop low blood sugars in the morning, call me, and we can decrease that insulin dose more.   We'll keep you in the loop regarding the progress on applying for patient assistance for Tresiba (the long lasting insulin) and Ozempic.   Call me with any questions or concerns!  [name], PharmD, [place], CPP Clinical Pharmacist [place] [phone]   Visit Information  Goals Addressed            This Visit's Progress    Monitor and Manage My Blood Sugar       Follow Up Date 10/2021   Patient Goals/Self-Care Activities  Over the next 90 days, patient will:  - check blood glucose daily, document, and provide at future appointments - take medications as prescribed - contact provider with any episodes of hypoglycemia - report any questions or concerns to provider        Print copy of patient instructions provided.    [name], PharmD, [place], CPP Clinical Pharmacist [place] [phone]

## 2020-10-10 NOTE — Chronic Care Management (AMB) (Signed)
Chronic Care Management   Note  10/10/2020 Name: Katie Huber MRN: [id number] DOB: [birth year]   Subjective:  Katie Huber is a 69 y.o. year old female who is a primary care patient of Tullo, Aris Everts, MD. The CCM team was consulted for assistance with chronic disease management and care coordination needs.    Patient called today with questions. Connected with patient by telephone for follow up in response to provider referral for pharmacy case management and/or care coordination services.    Review of patient status, including review of consultants reports, laboratory and other test data, was performed as part of comprehensive evaluation and provision of chronic care management services.   SDOH (Social Determinants of Health) assessments and interventions performed:  SDOH Interventions     Most Recent Value  SDOH Interventions  Financial Strain Interventions Other (Comment)  [manufacturer assistance]       Objective:  Lab Results  Component Value Date   CREATININE 0.66 08/29/2020   CREATININE 1.10 (H) 03/19/2020   CREATININE 0.60 01/30/2020    Lab Results  Component Value Date   HGBA1C 6.6 (H) 08/29/2020       Component Value Date/Time   CHOL 151 08/29/2020 1130   TRIG 115.0 08/29/2020 1130   HDL 58.40 08/29/2020 1130   CHOLHDL 3 08/29/2020 1130   VLDL 23.0 08/29/2020 1130   LDLCALC 70 08/29/2020 1130   LDLDIRECT 69.0 06/05/2017 0942     Clinical ASCVD: No  The 10-year ASCVD risk score Mikey Bussing DC Jr., et al., 2013) is: 24.3%   Values used to calculate the score:     Age: 43 years     Sex: Female     Is Non-Hispanic African American: No     Diabetic: Yes     Tobacco smoker: No     Systolic Blood Pressure: 758 mmHg     Is BP treated: Yes     HDL Cholesterol: 58.4 mg/dL     Total Cholesterol: 151 mg/dL      BP Readings from Last 3 Encounters:  09/03/20 (!) 148/88  07/25/20 (!) 150/63  04/23/20 (!) 171/75    Assessment:   No Known  Allergies  Medications Reviewed Today    Reviewed by De Hollingshead, RPH-CPP (Pharmacist) on 09/20/20 at 1438  Med List Status: <None>  Medication Order Taking? Sig Documenting Provider Last Dose Status Informant  acyclovir (ZOVIRAX) 400 MG tablet [id number] No TAKE ONE TABLET BY MOUTH  FIVE TIMES DAILY  Patient not taking: Reported on 09/03/2020   [name], MD Not Taking Active   albuterol (PROVENTIL HFA;VENTOLIN HFA) 108 (90 Base) MCG/ACT inhaler [id number] No Inhale 2 puffs into the lungs every 6 (six) hours as needed for wheezing or shortness of breath.  Patient not taking: Reported on 09/20/2020   Kendell Bane, NP Not Taking Active   albuterol (PROVENTIL) (2.5 MG/3ML) 0.083% nebulizer solution [id number] No Take 3 mLs (2.5 mg total) by nebulization every 6 (six) hours as needed for wheezing or shortness of breath.  Patient not taking: Reported on 09/20/2020   Kendell Bane, NP Not Taking Active   atorvastatin (LIPITOR) 20 MG tablet [id number] Yes TAKE ONE TABLET EVERY DAY [name], MD Taking Active   cyclobenzaprine (FLEXERIL) 5 MG tablet [id number] No Take 1 tablet (5 mg total) by mouth 3 (three) times daily as needed.  Patient not taking: Reported on 09/20/2020   Menshew, Dannielle Karvonen, PA-C Not Taking Active   furosemide (LASIX) 20 MG  tablet [id number] Yes TAKE ONE TABLET EVERY DAY AS NEEDED FOR FLUID RETENTION [name], MD Taking Active   gabapentin (NEURONTIN) 300 MG capsule [id number] Yes TAKE 2 CAPSULES BY MOUTH 3  TIMES DAILY [name], MD Taking Active   glucose blood test strip [id number] Yes OneTouch Verio Fex.    Test one time daily for steroid induced diabetes [name], MD Taking Active   insulin NPH Human (HUMULIN N) 100 UNIT/ML injection [id number] Yes Inject 0.1 mLs (10 Units total) into the skin at bedtime. [name], MD Taking Active   Insulin Pen Needle (PEN NEEDLES) 32G X 6 MM MISC [id number] Yes 1 application by Does not  apply route daily. Use to inject insulin once daily. [name], MD Taking Active   Insulin Syringe 27G X 1/2" 1 ML MISC [id number] Yes Use to inject insulin as directed. [name], MD Taking Active   losartan (COZAAR) 100 MG tablet [id number] Yes TAKE ONE TABLET EVERY DAY [name], MD Taking Active   montelukast (SINGULAIR) 10 MG tablet [id number] Yes TAKE 1 TABLET BY MOUTH AT  BEDTIME [name], MD Taking Active   omeprazole (PRILOSEC) 20 MG capsule [id number] Yes TAKE 1 CAPSULE BY MOUTH  TWICE DAILY [name], MD Taking Active   PARoxetine (PAXIL) 20 MG tablet [id number] Yes TAKE 1 TABLET BY MOUTH IN  THE MORNING [name], MD Taking Active   pneumococcal 13-valent conjugate vaccine (PREVNAR 13) injection 0.5 mL [id number]   [name], MD  Consider Medication Status and Discontinue (Patient Preference)   potassium chloride SA (KLOR-CON) 20 MEQ tablet [id number] No TAKE 1 TABLET BY MOUTH  DAILY WHEN USING FUROSEMIDE  Patient not taking: Reported on 09/20/2020   [name], MD Not Taking Active   Probiotic Product (PROBIOTIC DAILY PO) [id number] Yes Take 1 capsule by mouth daily. [provider] Taking Active Self  promethazine (PHENERGAN) 25 MG tablet [id number] Yes TAKE 1 TABLET BY MOUTH  EVERY 8 HOURS AS NEEDED FOR NAUSEA AND VOMITING [name], MD Taking Active   TOUJEO MAX SOLOSTAR 300 UNIT/ML Solostar Pen [id number] Yes INJECT 40 UNITS UNDER THE SKIN DAILY [name], MD Taking Active   triamterene-hydrochlorothiazide (MAXZIDE-25) 37.5-25 MG tablet [id number] Yes TAKE ONE TABLET BY MOUTH EVERY DAY [name], MD Taking Active   VITAMIN D, CHOLECALCIFEROL, PO [id number] Yes Take 1 tablet by mouth daily. [provider] Taking Active Self  WIXELA INHUB 250-50 MCG/DOSE AEPB [id number] No USE 1 INHALATION BY MOUTH  INTO THE LUNGS TWO TIMES  DAILY  Patient not taking: Reported on 09/20/2020   [name], MD Not Taking Active    zolpidem (AMBIEN) 10 MG tablet [id number] Yes Take 1 tablet (10 mg total) by mouth at bedtime as needed. for sleep Kendell Bane, NP Taking Active           Patient Care Plan: Diabetes Type 2 (Adult)    Problem Identified: Disease Progression Prevention     Long-Range Goal: Disease Progression Prevented or Minimized   Start Date: 10/10/2020  This Visit's Progress: On track  Priority: High  Note:   Objective:   Diabetes: controlled, but not optimally managed; Toujeo 40 units QAM; hx diarrhea w/ metformin; fastings 100-115. Plan to start Ozempic and change to [place] through patient assistance  HTN: controlled, but not monitored. losartan 100 mg, triamterene/HCTZ 37.5/25 mg; has not been checking BP at home as instructed  by PCP. Attempted to order BP machine through OTC benefits, but they were out of stock  Hyperlipidemia: atorvastatin 20 mg daily; LDL right at goal 70  Asthma: reports that she has not required Wixela since montelukast 10 mg daily. Albuterol HFA ~1-2 times monthly. Albuterol nebulizer if respiratory infection  Cervicalgia: gabapentin 600 mg TID  Current Barriers:   Unable to independently afford medications  Unable to independently lose weight loss  Pharmacist Clinical Goal(s):   Over the next 30 days, patient will work with PharmD and primary care provider to address medication access barriers  Over the next 90 days, patient will work with PharmD and primary care provider to optimize glycemic regimen  Interventions:  Comprehensive medication review performed, medication list updated in electronic medical record  Inter-disciplinary care team collaboration (see longitudinal plan of care)  Ozempic samples available in clinic. Start Ozempic 0.25 mg once weekly x 4 weeks, then increase to 0.5 mg weekly. Providing sample today. Decrease Toujeo (and subsequent Tresiba prescription once received) to 30 units daily. Patient will bring patient assistance  application by clinic at her earliest convenience  Patient Goals/Self-Care Activities  Over the next 90 days, patient will:  - check blood glucose daily, document, and provide at future appointments - take medications as prescribed - contact provider with any episodes of hypoglycemia - report any questions or concerns to provider   Follow Up Plan: Telephone follow up appointment with care management team member scheduled for: ~6 weeks     Goals Addressed            This Visit's Progress    Monitor and Manage My Blood Sugar       Follow Up Date 10/2021   Patient Goals/Self-Care Activities  Over the next 90 days, patient will:  - check blood glucose daily, document, and provide at future appointments - take medications as prescribed - contact provider with any episodes of hypoglycemia - report any questions or concerns to provider       [name], PharmD, [place], [place] Pharmacist [place] [phone]

## 2020-10-16 ENCOUNTER — Ambulatory Visit: Payer: Medicare Other | Admitting: Internal Medicine

## 2020-10-16 ENCOUNTER — Other Ambulatory Visit: Payer: Self-pay

## 2020-10-16 ENCOUNTER — Encounter: Payer: Self-pay | Admitting: Internal Medicine

## 2020-10-16 VITALS — BP 130/80 | HR 67 | Temp 97.8°F | Resp 16 | Ht 67.0 in | Wt 329.0 lb

## 2020-10-16 DIAGNOSIS — G4733 Obstructive sleep apnea (adult) (pediatric): Secondary | ICD-10-CM | POA: Diagnosis not present

## 2020-10-16 DIAGNOSIS — J4520 Mild intermittent asthma, uncomplicated: Secondary | ICD-10-CM | POA: Diagnosis not present

## 2020-10-16 DIAGNOSIS — R0602 Shortness of breath: Secondary | ICD-10-CM

## 2020-10-16 NOTE — Patient Instructions (Signed)
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
## 2020-10-18 ENCOUNTER — Telehealth: Payer: Medicare Other

## 2020-11-02 ENCOUNTER — Other Ambulatory Visit: Payer: Self-pay | Admitting: Internal Medicine

## 2020-11-02 ENCOUNTER — Other Ambulatory Visit: Payer: Self-pay | Admitting: Hospice and Palliative Medicine

## 2020-11-02 DIAGNOSIS — G4700 Insomnia, unspecified: Secondary | ICD-10-CM

## 2020-11-02 MED ORDER — ZOLPIDEM TARTRATE 10 MG PO TABS: 10.0000 mg | ORAL_TABLET | Freq: Every evening | ORAL | 0 refills | Status: DC | PRN

## 2020-11-06 ENCOUNTER — Telehealth: Payer: Medicare Other

## 2020-11-06 ENCOUNTER — Telehealth: Payer: Self-pay | Admitting: Pharmacist

## 2020-11-06 NOTE — Telephone Encounter (Signed)
  Chronic Care Management   Note  11/06/2020 Name: Katie Huber MRN: [id number] DOB: 1951/10/29   Attempted to contact patient to address medication management questions that she posed to [place] today. LVM for her to return my call at her convenience.   Plan: - If I do not hear back from the patient by end of business today, will collaborate with Care Guide to outreach to schedule follow up with me   [name], PharmD, [place], [place] Pharmacist [place] [phone]

## 2020-11-07 ENCOUNTER — Other Ambulatory Visit: Payer: Self-pay

## 2020-11-07 ENCOUNTER — Encounter: Payer: Self-pay | Admitting: Internal Medicine

## 2020-11-07 ENCOUNTER — Telehealth: Payer: Self-pay | Admitting: Internal Medicine

## 2020-11-07 ENCOUNTER — Ambulatory Visit (INDEPENDENT_AMBULATORY_CARE_PROVIDER_SITE_OTHER): Payer: Medicare Other | Admitting: Internal Medicine

## 2020-11-07 ENCOUNTER — Ambulatory Visit: Payer: Medicare Other | Admitting: Pharmacist

## 2020-11-07 VITALS — BP 174/84 | HR 75 | Temp 98.0°F | Resp 16 | Ht 67.0 in | Wt 319.4 lb

## 2020-11-07 DIAGNOSIS — Z1231 Encounter for screening mammogram for malignant neoplasm of breast: Secondary | ICD-10-CM

## 2020-11-07 DIAGNOSIS — I10 Essential (primary) hypertension: Secondary | ICD-10-CM

## 2020-11-07 DIAGNOSIS — E1169 Type 2 diabetes mellitus with other specified complication: Secondary | ICD-10-CM

## 2020-11-07 DIAGNOSIS — I700 Atherosclerosis of aorta: Secondary | ICD-10-CM

## 2020-11-07 DIAGNOSIS — I2510 Atherosclerotic heart disease of native coronary artery without angina pectoris: Secondary | ICD-10-CM

## 2020-11-07 DIAGNOSIS — E785 Hyperlipidemia, unspecified: Secondary | ICD-10-CM

## 2020-11-07 DIAGNOSIS — E669 Obesity, unspecified: Secondary | ICD-10-CM

## 2020-11-07 DIAGNOSIS — I2584 Coronary atherosclerosis due to calcified coronary lesion: Secondary | ICD-10-CM

## 2020-11-07 MED ORDER — TRESIBA FLEXTOUCH 200 UNIT/ML ~~LOC~~ SOPN: 25.0000 [IU] | PEN_INJECTOR | Freq: Every day | SUBCUTANEOUS | 0 refills | Status: DC

## 2020-11-07 MED ORDER — AMLODIPINE BESYLATE 5 MG PO TABS: 5.0000 mg | ORAL_TABLET | Freq: Every day | ORAL | 3 refills | Status: DC
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
## 2020-11-07 NOTE — Chronic Care Management (AMB) (Signed)
Chronic Care Management   Pharmacy Note  11/07/2020 Name: Wm Fruchter MRN: [id number] DOB: Jul 12, 1951   Subjective:  Katie Huber is a 69 y.o. year old female who is a primary care patient of Tullo, Aris Everts, MD. The CCM team was consulted for assistance with chronic disease management and care coordination needs.    Engaged with patient face to face for follow up visit in response to provider referral for pharmacy case management and/or care coordination services.   Consent to Services:  Katie Huber was given information about Chronic Care Management services, agreed to services, and gave verbal consent prior to initiation of services on 09/20/20. Please see initial visit note for detailed documentation.   SDOH (Social Determinants of Health) assessments and interventions performed:  SDOH Interventions     Most Recent Value  SDOH Interventions  Financial Strain Interventions Other (Comment)  [manufacturer assistance]       Objective:  Lab Results  Component Value Date   CREATININE 0.66 08/29/2020   CREATININE 1.10 (H) 03/19/2020   CREATININE 0.60 01/30/2020    Lab Results  Component Value Date   HGBA1C 6.6 (H) 08/29/2020       Component Value Date/Time   CHOL 151 08/29/2020 1130   TRIG 115.0 08/29/2020 1130   HDL 58.40 08/29/2020 1130   CHOLHDL 3 08/29/2020 1130   VLDL 23.0 08/29/2020 1130   LDLCALC 70 08/29/2020 1130   LDLDIRECT 69.0 06/05/2017 0942    Wt Readings from Last 3 Encounters:  11/07/20 (!) 319 lb 6.4 oz (144.9 kg)  10/16/20 (!) 329 lb (149.2 kg)  09/03/20 (!) 332 lb 3.2 oz (150.7 kg)    Clinical ASCVD: No  The 10-year ASCVD risk score Mikey Bussing DC Jr., et al., 2013) is: 32.1%   Values used to calculate the score:     Age: 13 years     Sex: Female     Is Non-Hispanic African American: No     Diabetic: Yes     Tobacco smoker: No     Systolic Blood Pressure: 812 mmHg     Is BP treated: Yes     HDL Cholesterol: 58.4 mg/dL     Total  Cholesterol: 151 mg/dL     BP Readings from Last 3 Encounters:  11/07/20 (!) 174/84  10/16/20 130/80  09/03/20 (!) 148/88    Assessment/Interventions: Review of patient past medical history, allergies, medications, health status, including review of consultants reports, laboratory and other test data, was performed as part of comprehensive evaluation and provision of chronic care management services.   No Known Allergies  Medications Reviewed Today    Reviewed by Adair Laundry, [place] (Certified Medical Assistant) on 11/07/20 at 11  Med List Status: <None>  Medication Order Taking? Sig Documenting Provider Last Dose Status Informant  acyclovir (ZOVIRAX) 400 MG tablet [id number] No TAKE ONE TABLET BY MOUTH  FIVE TIMES DAILY  Patient not taking: Reported on 09/03/2020   [name], MD Not Taking Active   albuterol (PROVENTIL HFA;VENTOLIN HFA) 108 (90 Base) MCG/ACT inhaler [id number] Yes Inhale 2 puffs into the lungs every 6 (six) hours as needed for wheezing or shortness of breath. Kendell Bane, NP Taking Active   albuterol (PROVENTIL) (2.5 MG/3ML) 0.083% nebulizer solution [id number] Yes Take 3 mLs (2.5 mg total) by nebulization every 6 (six) hours as needed for wheezing or shortness of breath. Kendell Bane, NP Taking Active   atorvastatin (LIPITOR) 20 MG tablet [id number] Yes TAKE ONE TABLET EVERY DAY Tullo,  Aris Everts, MD Taking Active   cyclobenzaprine (FLEXERIL) 5 MG tablet [id number] No Take 1 tablet (5 mg total) by mouth 3 (three) times daily as needed.  Patient not taking: Reported on 09/20/2020   Melvenia Needles, PA-C Not Taking Active   furosemide (LASIX) 20 MG tablet [id number] Yes TAKE ONE TABLET EVERY DAY AS NEEDED FOR FLUID RETENTION [name], MD Taking Active   gabapentin (NEURONTIN) 300 MG capsule [id number] Yes TAKE 2 CAPSULES 3 TIMES DAILY [name], MD Taking Active   glucose blood test strip [id number] Yes OneTouch Verio Fex.    Test one time  daily for steroid induced diabetes [name], MD Taking Active   insulin glargine, 2 Unit Dial, (TOUJEO MAX SOLOSTAR) 300 UNIT/ML Solostar Pen [id number] Yes Inject 30 Units into the skin daily. [name], MD Taking Active   Insulin Pen Needle (PEN NEEDLES) 32G X 6 MM MISC [id number] Yes 1 application by Does not apply route daily. Use to inject insulin once daily. [name], MD Taking Active   losartan (COZAAR) 100 MG tablet [id number] Yes TAKE ONE TABLET EVERY DAY [name], MD Taking Active   metFORMIN (GLUCOPHAGE-XR) 500 MG 24 hr tablet [id number] Yes Take 1 tablet by mouth daily with a meal [name], MD Taking Active   montelukast (SINGULAIR) 10 MG tablet [id number] Yes TAKE 1 TABLET BY MOUTH AT  BEDTIME [name], MD Taking Active   omeprazole (PRILOSEC) 20 MG capsule [id number] Yes TAKE 1 CAPSULE BY MOUTH  TWICE DAILY [name], MD Taking Active   PARoxetine (PAXIL) 20 MG tablet [id number] Yes TAKE 1 TABLET BY MOUTH IN  THE MORNING [name], MD Taking Active   pneumococcal 13-valent conjugate vaccine (PREVNAR 13) injection 0.5 mL [id number]   [name], MD  Active   potassium chloride SA (KLOR-CON) 20 MEQ tablet [id number] Yes TAKE 1 TABLET BY MOUTH  DAILY WHEN USING FUROSEMIDE [name], MD Taking Active   Probiotic Product (PROBIOTIC DAILY PO) [id number] Yes Take 1 capsule by mouth daily. [provider] Taking Active Self  promethazine (PHENERGAN) 25 MG tablet [id number] Yes TAKE 1 TABLET BY MOUTH  EVERY 8 HOURS AS NEEDED FOR NAUSEA AND VOMITING [name], MD Taking Active   Semaglutide,0.25 or 0.5MG /DOS, (OZEMPIC, 0.25 OR 0.5 MG/DOSE,) 2 MG/1.5ML SOPN [id number] Yes Inject 0.5 mg into the skin once a week. [name], MD Taking Active   triamterene-hydrochlorothiazide [facility]) 37.5-25 MG tablet [id number] Yes TAKE ONE TABLET BY MOUTH EVERY DAY [name], MD Taking Active   VITAMIN D, CHOLECALCIFEROL, PO [id number]  Yes Take 1 tablet by mouth daily. [provider] Taking Active Self  WIXELA INHUB 250-50 Earlie Raveling [id number] Yes USE 1 INHALATION BY MOUTH  INTO THE LUNGS TWO TIMES  DAILY [name], MD Taking Active   zolpidem (AMBIEN) 10 MG tablet [id number] Yes Take 1 tablet (10 mg total) by mouth at bedtime as needed. for sleep [name], NP Taking Active           Patient Active Problem List   Diagnosis Date Noted  . Oral cavity pain 01/09/2020  . Vitamin D deficiency 01/09/2020  . Diarrhea, functional 07/26/2018  . Tubular adenoma of colon 04/01/2018  . Benign neoplasm of cecum   . Encounter for screening colonoscopy   . Rectal polyp   . B12 deficiency 01/26/2018  . Chronic respiratory failure with hypoxia (HCC)  12/11/2017  . Hospital discharge follow-up 03/14/2017  . Asthma 03/04/2017  . Herpes simplex infection 08/16/2015  . Skin neoplasm 08/16/2015  . Pulmonary hypertension ([place]) 02/06/2015  . S/P vaginal hysterectomy 06/13/2014  . Encounter for preventive health examination 06/13/2014  . Essential hypertension, benign 12/27/2013  . S/P bariatric surgery 09/20/2013  . 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 Type 2 diabetes mellitus with obesity ([place]) 03/02/2013  . Hyperlipidemia associated with type 2 diabetes mellitus ([place]) 12/17/2011  . Encounter for long-term (current) use of other medications 12/17/2011  . Cervicalgia 12/16/2011  . Sleep apnea 12/16/2011  . Obesity, Class III, BMI 40-49.9 (morbid obesity) ([place])     Medication Assistance: Application for Ozempic, [place] medication assistance program in process. Anticipated assistance start date TBD. See plan of care below for additional detail.   Patient Care Plan: Medication Management    Problem Identified: Diabetes, Weight Management, HTN     Long-Range Goal: Disease Progression Prevented or Minimized   Start Date: 10/10/2020  Recent Progress: On track  Priority: High  Note:   Current  Barriers:  . Unable to independently afford treatment regimen . Unable to independently monitor therapeutic efficacy . Unable to achieve control of diabetes   Pharmacist Clinical Goal(s):  [name] Over the next 90 days, patient will verbalize ability to afford treatment regimen. . Over the next 90 days, achieve adherence to monitoring guidelines and medication adherence to achieve therapeutic efficacy. . Over the next 90 days, achieve control of diabetes as evidenced by improvement in A1c through collaboration with PharmD and provider.   Interventions: . Inter-disciplinary care team collaboration (see longitudinal plan of care) . Comprehensive medication review performed; medication list updated in electronic medical record  Diabetes: . Uncontrolled; current treatment: Ozempic 0.25 mg (x 4 weeks); Toujeo 30 units daily, metformin XR 500 mg daily   . Current glucose readings: fasting glucose: 110-120s, post prandial glucose: 110-120s . Endorses reduction in appetite, 10 lbs weight loss since starting Ozempic . Ozempic and [place] supply from [employer] patient assistance provided today. . Complete current supply of Toujeo, then switch to [place] unit-to-unit. Patient verbalized understanding . Due to increase Ozempic next week. To reduce risk of hypoglycemia, reduce basal insulin to 25 units daily.  . Will consider titration of Ozempic moving forward, once stable Ozempic doses achieved.  . Patient would significantly benefit from use of CGM to monitor glucose in response to meals, insulin, physical activity. Will submit order today.  . Completed patient portion of [employer] application for [place] for 2022. Will collaborate w/ CPhT and PCP for completion and submission of application  Hypertension: . Uncontrolled; current treatment: losartan 100 mg QAM, triamterene/HCTZ 37.5/25 mg QAM, amlodipine 5 mg QPM added today by PCP . Continue as directed by PCP.    Hyperlipidemia: . Uncontrolled; current treatment: atorvastatin 20 mg daily  . Recommended to continue current regimen  Asthma: . Controlled; current treatment: montelukast 10 mg daily, Wixela PRN, albuterol HFA PRN 1-2 times monthly. Nebulizer only required when she has respiratory infection . Recommended to continue current regimen  Cervicalgia: . Appropriately managed; current treatment: gabapentin 600 mg TID  Patient Goals/Self-Care Activities . Over the next 89 days, patient will:  - take medications as prescribed check blood glucose TID using CGM, document, and provide at future appointments collaborate with provider on medication access solutions  Follow Up Plan: Telephone follow up appointment with care management team member scheduled for: ~6 weeks  Plan: Telephone follow up appointment with care management team member scheduled for: ~ 6 weeks  [name], PharmD, [place], [place] Pharmacist [place] [place] [phone]

## 2020-11-07 NOTE — Telephone Encounter (Signed)
Spoken to patient, she has been keeping a log of her BP and they have been very elevated for months, but more recently becoming to high and making her uncomfortable. Patient does not have any sx of SOB, Lightheadedness, blurry vision, head aches, jaw px, arm px, and chest px. Patient has been scheduled for an appointment with PCP today at 1600 for follow up on BP.

## 2020-11-07 NOTE — Telephone Encounter (Signed)
Patient called in to give blood pressure reading was running high 193/99

## 2020-11-07 NOTE — Telephone Encounter (Signed)
Met with patient face to face today.

## 2020-11-07 NOTE — Patient Instructions (Addendum)
Increase Ozempic to 0.5 mg as directed with your next injection. At that time, reduce your insulin to 25 units daily. When you finish your supply of Toujeo, switch to [place] (with the same units).   If you receive a call from [facility], it is about your [place] 2 order.   Take care!  [name], PharmD [phone]  Visit Information  Patient Care Plan: Medication Management    Problem Identified: Diabetes, Weight Management, HTN     Long-Range Goal: Disease Progression Prevented or Minimized   Start Date: 10/10/2020  Recent Progress: On track  Priority: High  Note:   Current Barriers:  . Unable to independently afford treatment regimen . Unable to independently monitor therapeutic efficacy . Unable to achieve control of diabetes   Pharmacist Clinical Goal(s):  [name] Over the next 90 days, patient will verbalize ability to afford treatment regimen. . Over the next 90 days, achieve adherence to monitoring guidelines and medication adherence to achieve therapeutic efficacy. . Over the next 90 days, achieve control of diabetes as evidenced by improvement in A1c through collaboration with PharmD and provider.   Interventions: . Inter-disciplinary care team collaboration (see longitudinal plan of care) . Comprehensive medication review performed; medication list updated in electronic medical record  Diabetes: . Uncontrolled; current treatment: Ozempic 0.25 mg (x 4 weeks); Toujeo 30 units daily, metformin XR 500 mg daily   . Current glucose readings: fasting glucose: 110-120s, post prandial glucose: 110-120s . Endorses reduction in appetite, 10 lbs weight loss since starting Ozempic . Ozempic and [place] supply from [employer] patient assistance provided today. . Complete current supply of Toujeo, then switch to [place] unit-to-unit. Patient verbalized understanding . Due to increase Ozempic next week. To reduce risk of hypoglycemia, reduce basal insulin to 25 units  daily.  . Will consider titration of Ozempic moving forward, once stable Ozempic doses achieved.  . Patient would significantly benefit from use of CGM to monitor glucose in response to meals, insulin, physical activity. Will submit order today.  . Completed patient portion of [employer] application for [place] for 2022. Will collaborate w/ CPhT and PCP for completion and submission of application  Hypertension: . Uncontrolled; current treatment: losartan 100 mg QAM, triamterene/HCTZ 37.5/25 mg QAM, amlodipine 5 mg QPM added today by PCP . Continue as directed by PCP.   Hyperlipidemia: . Uncontrolled; current treatment: atorvastatin 20 mg daily  . Recommended to continue current regimen  Asthma: . Controlled; current treatment: montelukast 10 mg daily, Wixela PRN, albuterol HFA PRN 1-2 times monthly. Nebulizer only required when she has respiratory infection . Recommended to continue current regimen  Cervicalgia: . Appropriately managed; current treatment: gabapentin 600 mg TID  Patient Goals/Self-Care Activities . Over the next 90 days, patient will:  - take medications as prescribed check blood glucose TID using CGM, document, and provide at future appointments collaborate with provider on medication access solutions  Follow Up Plan: Telephone follow up appointment with care management team member scheduled for: ~6 weeks      Print copy of patient instructions, educational materials, and care plan provided in person.  Plan: Telephone follow up appointment with care management team member scheduled for: ~ 6 weeks  [name], PharmD, [place], [place] Pharmacist [place] [phone]

## 2020-11-07 NOTE — Patient Instructions (Addendum)
Adding amlodipine 5 mg daily  Starting tonight  Continue losartan and triamterene in the morning  If BP is not < 150/90 in 4 days,  Double the amlodipine to 10 mg qhs   Stop the motrin as well!  Use tylenol 1000 mg every 12 hours

## 2020-11-08 LAB — BASIC METABOLIC PANEL
BUN: 11 mg/dL (ref 6–23)
CO2: 28 mEq/L (ref 19–32)
Calcium: 9.9 mg/dL (ref 8.4–10.5)
Chloride: 99 mEq/L (ref 96–112)
Creatinine, Ser: 0.95 mg/dL (ref 0.40–1.20)
GFR: 61.14 mL/min (ref >60.00)
Glucose, Bld: 100 mg/dL — ABNORMAL HIGH (ref 70–99)
Potassium: 4.1 mEq/L (ref 3.5–5.1)
Sodium: 135 mEq/L (ref 135–145)

## 2020-11-10 DIAGNOSIS — I2510 Atherosclerotic heart disease of native coronary artery without angina pectoris: Secondary | ICD-10-CM | POA: Insufficient documentation

## 2020-11-10 DIAGNOSIS — I700 Atherosclerosis of aorta: Secondary | ICD-10-CM | POA: Insufficient documentation

## 2020-11-10 NOTE — Assessment & Plan Note (Signed)
Reviewed findings of prior CT scan today..  Patient is  Tolerating tolerating atorvastatin  20 mg  daily

## 2020-11-10 NOTE — Assessment & Plan Note (Signed)
Noted on April 2021 chest CT.  Reviewed  with patient : continue atorvastatin

## 2020-11-10 NOTE — Assessment & Plan Note (Signed)
Improved glycemic control with initiation of Ozempic, resulting in  significant reduction in weight and insulin

## 2020-11-10 NOTE — Assessment & Plan Note (Addendum)
Recent loss of control noted by home monitoring, may BE DUE to use of NSAIDS.  Adding amlodipine starting at a dose of 5  Mg daily; increase to 10 mg after 4 days for SBP > 150.[name]  Continue losartan and mazxide at maximal doses.   Advised to suspend all NSAIDs and use 2000  Mg tylenol. Instead for management of pain

## 2020-11-14 ENCOUNTER — Telehealth: Payer: Medicare Other

## 2020-11-16 ENCOUNTER — Telehealth: Payer: Medicare Other

## 2020-11-27 ENCOUNTER — Ambulatory Visit: Payer: Medicare Other | Admitting: Pharmacist

## 2020-11-27 DIAGNOSIS — E1169 Type 2 diabetes mellitus with other specified complication: Secondary | ICD-10-CM

## 2020-11-27 DIAGNOSIS — E669 Obesity, unspecified: Secondary | ICD-10-CM

## 2020-11-27 DIAGNOSIS — I10 Essential (primary) hypertension: Secondary | ICD-10-CM

## 2020-11-27 NOTE — Patient Instructions (Signed)
Visit Information  Patient Care Plan: Medication Management    Problem Identified: Diabetes, Weight Management, HTN     Long-Range Goal: Disease Progression Prevented or Minimized   Start Date: 10/10/2020  Recent Progress: On track  Priority: High  Note:   Current Barriers:  . Unable to independently afford treatment regimen . Unable to independently monitor therapeutic efficacy . Unable to achieve control of diabetes   Pharmacist Clinical Goal(s):  [name] Over the next 90 days, patient will verbalize ability to afford treatment regimen. . Over the next 90 days, achieve adherence to monitoring guidelines and medication adherence to achieve therapeutic efficacy. . Over the next 90 days, achieve control of diabetes as evidenced by improvement in A1c through collaboration with PharmD and provider.   Interventions: . Inter-disciplinary care team collaboration (see longitudinal plan of care) . Comprehensive medication review performed; medication list updated in electronic medical record  Diabetes: . Uncontrolled; current treatment: Ozempic 0.5 mg; Tresiba 25 units daily, metformin XR 500 mg daily   . Current glucose readings: fasting glucose: 110-120s, post prandial glucose: 110-120s . Contacted [facility] to follow up on status of Libre 2 order. Rep noted they were trying to get in touch with the patient. Called patient, provided phone number for [facility] for her to call and confirm insurance information to proceed with benefits investigation.   Hypertension: . Uncontrolled; current treatment: losartan 100 mg QAM, triamterene/HCTZ 37.5/25 mg QAM, amlodipine 5 mg QPM . Continue current regimen at this time. Will f/u with next scheduled phone call.   Hyperlipidemia: . Uncontrolled; current treatment: atorvastatin 20 mg daily  . Recommended to continue current regimen  Asthma: . Controlled; current treatment: montelukast 10 mg daily, Wixela PRN, albuterol HFA  PRN 1-2 times monthly. Nebulizer only required when she has respiratory infection . Recommended to continue current regimen  Cervicalgia: . Appropriately managed; current treatment: gabapentin 600 mg TID  Patient Goals/Self-Care Activities . Over the next 90 days, patient will:  - take medications as prescribed check blood glucose TID using CGM, document, and provide at future appointments collaborate with provider on medication access solutions  Follow Up Plan: Telephone follow up appointment with care management team member scheduled for: ~1 week as previously scheduled       The patient verbalized understanding of instructions, educational materials, and care plan provided today and declined offer to receive copy of patient instructions, educational materials, and care plan.   Plan: Telephone follow up appointment with care management team member scheduled for: ~ 1 week as previously scheduled  Catie Feliz Beam, PharmD, [place], CPP Clinical Pharmacist [employer] at [employer] [phone]

## 2020-11-27 NOTE — Chronic Care Management (AMB) (Signed)
Chronic Care Management   Pharmacy Note  11/27/2020 Name: Katie Huber MRN: [id number] DOB: [birth year]  Subjective:  Katie Huber is a 69 y.o. year old female who is a primary care patient of Tullo, Mar Daring, MD. The CCM team was consulted for assistance with chronic disease management and care coordination needs.    Engaged with patient by telephone for follow up on Libre 2 order in response to provider referral for pharmacy case management and/or care coordination services.   Consent to Services:  [name] was given information about Chronic Care Management services, agreed to services, and gave verbal consent prior to initiation of services on 09/20/20. Please see initial visit note for detailed documentation.   Objective:  Lab Results  Component Value Date   CREATININE 0.95 11/07/2020   CREATININE 0.66 08/29/2020   CREATININE 1.10 (H) 03/19/2020    Lab Results  Component Value Date   HGBA1C 6.6 (H) 08/29/2020       Component Value Date/Time   CHOL 151 08/29/2020 1130   TRIG 115.0 08/29/2020 1130   HDL 58.40 08/29/2020 1130   CHOLHDL 3 08/29/2020 1130   VLDL 23.0 08/29/2020 1130   LDLCALC 70 08/29/2020 1130   LDLDIRECT 69.0 06/05/2017 0942    Clinical ASCVD: No  The 10-year ASCVD risk score [name] DC Jr., et al., 2013) is: 32.1%   Values used to calculate the score:     Age: 24 years     Sex: Female     Is Non-Hispanic African American: No     Diabetic: Yes     Tobacco smoker: No     Systolic Blood Pressure: 174 mmHg     Is BP treated: Yes     HDL Cholesterol: 58.4 mg/dL     Total Cholesterol: 151 mg/dL     BP Readings from Last 3 Encounters:  11/07/20 (!) 174/84  10/16/20 130/80  09/03/20 (!) 148/88    Assessment/Interventions: Review of patient past medical history, allergies, medications, health status, including review of consultants reports, laboratory and other test data, was performed as part of comprehensive evaluation and provision of  chronic care management services.   SDOH (Social Determinants of Health) assessments and interventions performed:    CCM Care Plan  No Known Allergies  Medications Reviewed Today    Reviewed by Sandy Salaam, CMA (Certified Medical Assistant) on 11/07/20 at 1611  Med List Status: <None>  Medication Order Taking? Sig Documenting Provider Last Dose Status Informant  acyclovir (ZOVIRAX) 400 MG tablet [id number] No TAKE ONE TABLET BY MOUTH  FIVE TIMES DAILY  Patient not taking: Reported on 09/03/2020   Sherlene Shams, MD Not Taking Active   albuterol (PROVENTIL HFA;VENTOLIN HFA) 108 (90 Base) MCG/ACT inhaler [id number] Yes Inhale 2 puffs into the lungs every 6 (six) hours as needed for wheezing or shortness of breath. [name], NP Taking Active   albuterol (PROVENTIL) (2.5 MG/3ML) 0.083% nebulizer solution [id number] Yes Take 3 mLs (2.5 mg total) by nebulization every 6 (six) hours as needed for wheezing or shortness of breath. [name], NP Taking Active   atorvastatin (LIPITOR) 20 MG tablet [id number] Yes TAKE ONE TABLET EVERY DAY Sherlene Shams, MD Taking Active   cyclobenzaprine (FLEXERIL) 5 MG tablet [id number] No Take 1 tablet (5 mg total) by mouth 3 (three) times daily as needed.  Patient not taking: Reported on 09/20/2020   [name], PA-C Not Taking Active   furosemide (LASIX) 20 MG tablet [id number] Yes  TAKE ONE TABLET EVERY DAY AS NEEDED FOR FLUID RETENTION [name], MD Taking Active   gabapentin (NEURONTIN) 300 MG capsule [id number] Yes TAKE 2 CAPSULES 3 TIMES DAILY [name], MD Taking Active   glucose blood test strip [id number] Yes OneTouch Verio Fex.    Test one time daily for steroid induced diabetes [name], MD Taking Active   insulin glargine, 2 Unit Dial, (TOUJEO MAX SOLOSTAR) 300 UNIT/ML Solostar Pen [id number] Yes Inject 30 Units into the skin daily. [name], MD Taking Active   Insulin Pen Needle (PEN NEEDLES) 32G X 6  MM [id number] Yes 1 application by Does not apply route daily. Use to inject insulin once daily. [name], MD Taking Active   losartan (COZAAR) 100 MG tablet [id number] Yes TAKE ONE TABLET EVERY DAY [name], MD Taking Active   metFORMIN (GLUCOPHAGE-XR) 500 MG 24 hr tablet [id number] Yes Take 1 tablet by mouth daily with a meal [name], MD Taking Active   montelukast (SINGULAIR) 10 MG tablet [id number] Yes TAKE 1 TABLET BY MOUTH AT  BEDTIME [name], MD Taking Active   omeprazole (PRILOSEC) 20 MG capsule [id number] Yes TAKE 1 CAPSULE BY MOUTH  TWICE DAILY [name], MD Taking Active   PARoxetine (PAXIL) 20 MG tablet [id number] Yes TAKE 1 TABLET BY MOUTH IN  THE MORNING [name], MD Taking Active   pneumococcal 13-valent conjugate vaccine (PREVNAR 13) injection 0.5 mL [id number]   [name], MD  Active   potassium chloride SA (KLOR-CON) 20 MEQ tablet [id number] Yes TAKE 1 TABLET BY MOUTH  DAILY WHEN USING FUROSEMIDE [name], MD Taking Active   Probiotic Product (PROBIOTIC DAILY PO) [id number] Yes Take 1 capsule by mouth daily. [provider] Taking Active Self  promethazine (PHENERGAN) 25 MG tablet [id number] Yes TAKE 1 TABLET BY MOUTH  EVERY 8 HOURS AS NEEDED FOR NAUSEA AND VOMITING [name], MD Taking Active   Semaglutide,0.25 or 0.5MG /DOS, (OZEMPIC, 0.25 OR 0.5 MG/DOSE,) 2 MG/1.5ML SOPN [id number] Yes Inject 0.5 mg into the skin once a week. [name], MD Taking Active   triamterene-hydrochlorothiazide [facility]) 37.5-25 MG tablet [id number] Yes TAKE ONE TABLET BY MOUTH EVERY DAY [name], MD Taking Active   VITAMIN D, CHOLECALCIFEROL, PO [id number] Yes Take 1 tablet by mouth daily. [provider] Taking Active Self  WIXELA INHUB 250-50 Earlie Raveling [id number] Yes USE 1 INHALATION BY MOUTH  INTO THE LUNGS TWO TIMES  DAILY [name], MD Taking Active   zolpidem (AMBIEN) 10 MG tablet [id number] Yes Take 1  tablet (10 mg total) by mouth at bedtime as needed. for sleep [name], NP Taking Active           Patient Active Problem List   Diagnosis Date Noted  . Coronary artery calcification of native artery 11/10/2020  . Abdominal aortic atherosclerosis ([place]) 11/10/2020  . Oral cavity pain 01/09/2020  . Vitamin D deficiency 01/09/2020  . Diarrhea, functional 07/26/2018  . Tubular adenoma of colon 04/01/2018  . Benign neoplasm of cecum   . Encounter for screening colonoscopy   . Rectal polyp   . B12 deficiency 01/26/2018  . Chronic respiratory failure with hypoxia ([place]) 12/11/2017  . Hospital discharge follow-up 03/14/2017  . Asthma 03/04/2017  . Herpes simplex infection 08/16/2015  . Skin neoplasm 08/16/2015  . Pulmonary hypertension ([place]) 02/06/2015  . S/P vaginal hysterectomy 06/13/2014  . Encounter for  preventive health examination 06/13/2014  . Essential hypertension, benign 12/27/2013  . S/P bariatric surgery 09/20/2013  . 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 Type 2 diabetes mellitus with obesity ([place]) 03/02/2013  . Hyperlipidemia associated with type 2 diabetes mellitus ([place]) 12/17/2011  . Encounter for long-term (current) use of other medications 12/17/2011  . Cervicalgia 12/16/2011  . Sleep apnea 12/16/2011  . Obesity, Class III, BMI 40-49.9 (morbid obesity) ([place])     Conditions to be addressed/monitored per PCP order: HTN, HLD and DMII  Patient Care Plan: Medication Management    Problem Identified: Diabetes, Weight Management, HTN     Long-Range Goal: Disease Progression Prevented or Minimized   Start Date: 10/10/2020  Recent Progress: On track  Priority: High  Note:   Current Barriers:  . Unable to independently afford treatment regimen . Unable to independently monitor therapeutic efficacy . Unable to achieve control of diabetes   Pharmacist Clinical Goal(s):  [name] Over the next 90 days, patient will verbalize ability to afford treatment  regimen. . Over the next 90 days, achieve adherence to monitoring guidelines and medication adherence to achieve therapeutic efficacy. . Over the next 90 days, achieve control of diabetes as evidenced by improvement in A1c through collaboration with PharmD and provider.   Interventions: . Inter-disciplinary care team collaboration (see longitudinal plan of care) . Comprehensive medication review performed; medication list updated in electronic medical record  Diabetes: . Uncontrolled; current treatment: Ozempic 0.5 mg; Tresiba 25 units daily, metformin XR 500 mg daily   . Current glucose readings: fasting glucose: 110-120s, post prandial glucose: 110-120s . Contacted [facility] to follow up on status of Libre 2 order. Rep noted they were trying to get in touch with the patient. Called patient, provided phone number for [facility] for her to call and confirm insurance information to proceed with benefits investigation.   Hypertension: . Uncontrolled; current treatment: losartan 100 mg QAM, triamterene/HCTZ 37.5/25 mg QAM, amlodipine 5 mg QPM . Continue current regimen at this time. Will f/u with next scheduled phone call.   Hyperlipidemia: . Uncontrolled; current treatment: atorvastatin 20 mg daily  . Recommended to continue current regimen  Asthma: . Controlled; current treatment: montelukast 10 mg daily, Wixela PRN, albuterol HFA PRN 1-2 times monthly. Nebulizer only required when she has respiratory infection . Recommended to continue current regimen  Cervicalgia: . Appropriately managed; current treatment: gabapentin 600 mg TID  Patient Goals/Self-Care Activities . Over the next 90 days, patient will:  - take medications as prescribed check blood glucose TID using CGM, document, and provide at future appointments collaborate with provider on medication access solutions  Follow Up Plan: Telephone follow up appointment with care management team member  scheduled for: ~1 week as previously scheduled       Medication Assistance: Application for Novo [place]) medication assistance program in process. Anticipated assistance start date TBD. See plan of care above for additional detail.   Plan: Telephone follow up appointment with care management team member scheduled for: ~ 1 week as previously scheduled  [name], PharmD, [place], [place] Clinical Pharmacist [employer] at [employer] [phone]

## 2020-12-07 ENCOUNTER — Ambulatory Visit: Payer: Medicare Other | Admitting: Pharmacist

## 2020-12-07 DIAGNOSIS — E669 Obesity, unspecified: Secondary | ICD-10-CM

## 2020-12-07 DIAGNOSIS — E785 Hyperlipidemia, unspecified: Secondary | ICD-10-CM

## 2020-12-07 DIAGNOSIS — I10 Essential (primary) hypertension: Secondary | ICD-10-CM

## 2020-12-07 DIAGNOSIS — E1169 Type 2 diabetes mellitus with other specified complication: Secondary | ICD-10-CM

## 2020-12-07 MED ORDER — METFORMIN HCL ER 500 MG PO TB24: 1000.0000 mg | ORAL_TABLET | Freq: Every day | ORAL | 3 refills | Status: DC

## 2020-12-07 NOTE — Patient Instructions (Signed)
Visit Information  Patient Care Plan: Medication Management    Problem Identified: Diabetes, Weight Management, HTN     Long-Range Goal: Disease Progression Prevented or Minimized   Start Date: 10/10/2020  This Visit's Progress: On track  Recent Progress: On track  Priority: High  Note:   Current Barriers:  . Unable to independently afford treatment regimen . Unable to independently monitor therapeutic efficacy . Unable to achieve control of diabetes   Pharmacist Clinical Goal(s):  [name] Over the next 90 days, patient will verbalize ability to afford treatment regimen. . Over the next 90 days, achieve adherence to monitoring guidelines and medication adherence to achieve therapeutic efficacy. . Over the next 90 days, achieve control of diabetes as evidenced by improvement in A1c through collaboration with PharmD and provider.   Interventions: . 1:1 collaboration with [name], MD regarding development and update of comprehensive plan of care as evidenced by provider attestation and co-signature . Inter-disciplinary care team collaboration (see longitudinal plan of care) . Comprehensive medication review performed; medication list updated in electronic medical record  Diabetes: . Uncontrolled; current treatment: Ozempic 0.5 mg; Tresiba 25 units daily, metformin XR 500 mg daily - denies any GI upset since starting on metformin. No diarrhea. Does note that Ozempic decreases gastric emptying such that she has more GERD at night if she eats a big meal too late.  . Current glucose readings: fasting glucose: 100-120s, post prandial glucose: 110-130s . Notes that [facility] called her, confirmed insurance information. She is waiting to hear back on their benefits investigation process.  . Discussed goal of eventual reduction in insulin burden. Increase metformin to 2 tablets daily. Continue Ozempic 0.5 mg weekly. Discussed reducing Tresiba to 22 units to reduce risk of  hypoglycemia. Patient notes she worries about this. Continue current regimen, but patient will call me if she develops hypoglycemia. Patient verbalizes understanding.  . Patient assistance application for Novo pending.   Hypertension: . Uncontrolled at last clinic visit but anticipated improved; current treatment: losartan 100 mg QAM, triamterene/HCTZ 37.5/25 mg QAM, amlodipine 5 mg QPM . Continue current regimen at this time. PCP visit next week for f/u.  Hyperlipidemia: . Uncontrolled, at goal level 70; current treatment: atorvastatin 20 mg daily  . Recommended to continue current regimen  at this time. Continue to follow. Could consider dose titration of atorvastatin moving forward.   Asthma: . Controlled; current treatment: montelukast 10 mg daily, Wixela PRN, albuterol HFA PRN 1-2 times monthly. Nebulizer only required when she has respiratory infection . Recommended to continue current regimen  Cervicalgia: . Appropriately managed; current treatment: gabapentin 600 mg TID  Patient Goals/Self-Care Activities . Over the next 90 days, patient will:  - take medications as prescribed check blood glucose TID using CGM, document, and provide at future appointments collaborate with provider on medication access solutions  Follow Up Plan: Telephone follow up appointment with care management team member scheduled for: ~4 weeks      The patient verbalized understanding of instructions, educational materials, and care plan provided today and declined offer to receive copy of patient instructions, educational materials, and care plan.    Plan: Telephone follow up appointment with care management team member scheduled for: ~ 4 weeks  [name], PharmD, [place], [place] Clinical Pharmacist [employer] at [employer] [phone]

## 2020-12-07 NOTE — Chronic Care Management (AMB) (Signed)
Chronic Care Management   Pharmacy Note  12/07/2020 Name: Katie Huber MRN: [id number] DOB: 1951/07/29  Subjective:  Katie Huber is a 70 y.o. year old female who is a primary care patient of Tullo, Aris Everts, MD. The CCM team was consulted for assistance with chronic disease management and care coordination needs.    Engaged with patient by telephone for follow up visit in response to provider referral for pharmacy case management and/or care coordination services.   Consent to Services:  Patient was given information about Chronic Care Management services, agreed to services, and gave verbal consent prior to initiation of services on 09/20/20. Please see initial visit note for detailed documentation.   Objective:  Lab Results  Component Value Date   CREATININE 0.95 11/07/2020   CREATININE 0.66 08/29/2020   CREATININE 1.10 (H) 03/19/2020    Lab Results  Component Value Date   HGBA1C 6.6 (H) 08/29/2020       Component Value Date/Time   CHOL 151 08/29/2020 1130   TRIG 115.0 08/29/2020 1130   HDL 58.40 08/29/2020 1130   CHOLHDL 3 08/29/2020 1130   VLDL 23.0 08/29/2020 1130   LDLCALC 70 08/29/2020 1130   LDLDIRECT 69.0 06/05/2017 0942    Clinical ASCVD: No  The 10-year ASCVD risk score Mikey Bussing DC Jr., et al., 2013) is: 32.1%   Values used to calculate the score:     Age: 38 years     Sex: Female     Is Non-Hispanic African American: No     Diabetic: Yes     Tobacco smoker: No     Systolic Blood Pressure: 737 mmHg     Is BP treated: Yes     HDL Cholesterol: 58.4 mg/dL     Total Cholesterol: 151 mg/dL      BP Readings from Last 3 Encounters:  11/07/20 (!) 174/84  10/16/20 130/80  09/03/20 (!) 148/88    Assessment/Interventions: Review of patient past medical history, allergies, medications, health status, including review of consultants reports, laboratory and other test data, was performed as part of comprehensive evaluation and provision of chronic care  management services.   SDOH (Social Determinants of Health) assessments and interventions performed:  SDOH Interventions   Flowsheet Row Most Recent Value  SDOH Interventions   Financial Strain Interventions Other (Comment)  [manufacturer assistance]       CCM Care Plan  No Known Allergies  Medications Reviewed Today    Reviewed by Adair Laundry, Delia (Certified Medical Assistant) on 11/07/20 at 57  Med List Status: <None>  Medication Order Taking? Sig Documenting Provider Last Dose Status Informant  acyclovir (ZOVIRAX) 400 MG tablet [id number] No TAKE ONE TABLET BY MOUTH  FIVE TIMES DAILY  Patient not taking: Reported on 09/03/2020   [name], MD Not Taking Active   albuterol (PROVENTIL HFA;VENTOLIN HFA) 108 (90 Base) MCG/ACT inhaler [id number] Yes Inhale 2 puffs into the lungs every 6 (six) hours as needed for wheezing or shortness of breath. Kendell Bane, NP Taking Active   albuterol (PROVENTIL) (2.5 MG/3ML) 0.083% nebulizer solution [id number] Yes Take 3 mLs (2.5 mg total) by nebulization every 6 (six) hours as needed for wheezing or shortness of breath. Kendell Bane, NP Taking Active   atorvastatin (LIPITOR) 20 MG tablet [id number] Yes TAKE ONE TABLET EVERY DAY [name], MD Taking Active   cyclobenzaprine (FLEXERIL) 5 MG tablet [id number] No Take 1 tablet (5 mg total) by mouth 3 (three) times daily as needed.  Patient not  taking: Reported on 09/20/2020   Menshew, Dannielle Karvonen, PA-C Not Taking Active   furosemide (LASIX) 20 MG tablet [id number] Yes TAKE ONE TABLET EVERY DAY AS NEEDED FOR FLUID RETENTION [name], MD Taking Active   gabapentin (NEURONTIN) 300 MG capsule [id number] Yes TAKE 2 CAPSULES 3 TIMES DAILY [name], MD Taking Active   glucose blood test strip [id number] Yes OneTouch Verio Fex.    Test one time daily for steroid induced diabetes [name], MD Taking Active   insulin glargine, 2 Unit Dial, (TOUJEO MAX SOLOSTAR) 300  UNIT/ML Solostar Pen [id number] Yes Inject 30 Units into the skin daily. [name], MD Taking Active   Insulin Pen Needle (PEN NEEDLES) 32G X 6 MM [id number] Yes 1 application by Does not apply route daily. Use to inject insulin once daily. [name], MD Taking Active   losartan (COZAAR) 100 MG tablet [id number] Yes TAKE ONE TABLET EVERY DAY [name], MD Taking Active   metFORMIN (GLUCOPHAGE-XR) 500 MG 24 hr tablet [id number] Yes Take 1 tablet by mouth daily with a meal [name], MD Taking Active   montelukast (SINGULAIR) 10 MG tablet [id number] Yes TAKE 1 TABLET BY MOUTH AT  BEDTIME [name], MD Taking Active   omeprazole (PRILOSEC) 20 MG capsule [id number] Yes TAKE 1 CAPSULE BY MOUTH  TWICE DAILY [name], MD Taking Active   PARoxetine (PAXIL) 20 MG tablet [id number] Yes TAKE 1 TABLET BY MOUTH IN  THE MORNING [name], MD Taking Active   pneumococcal 13-valent conjugate vaccine (PREVNAR 13) injection 0.5 mL [id number]   [name], MD  Active   potassium chloride SA (KLOR-CON) 20 MEQ tablet [id number] Yes TAKE 1 TABLET BY MOUTH  DAILY WHEN USING FUROSEMIDE [name], MD Taking Active   Probiotic Product (PROBIOTIC DAILY PO) [id number] Yes Take 1 capsule by mouth daily. [provider] Taking Active Self  promethazine (PHENERGAN) 25 MG tablet [id number] Yes TAKE 1 TABLET BY MOUTH  EVERY 8 HOURS AS NEEDED FOR NAUSEA AND VOMITING [name], MD Taking Active   Semaglutide,0.25 or 0.5MG /DOS, (OZEMPIC, 0.25 OR 0.5 MG/DOSE,) 2 MG/1.5ML SOPN [id number] Yes Inject 0.5 mg into the skin once a week. [name], MD Taking Active   triamterene-hydrochlorothiazide [facility]) 37.5-25 MG tablet [id number] Yes TAKE ONE TABLET BY MOUTH EVERY DAY [name], MD Taking Active   VITAMIN D, CHOLECALCIFEROL, PO [id number] Yes Take 1 tablet by mouth daily. [provider] Taking Active Self  WIXELA INHUB 250-50 Earlie Raveling [id number] Yes  USE 1 INHALATION BY MOUTH  INTO THE LUNGS TWO TIMES  DAILY [name], MD Taking Active   zolpidem (AMBIEN) 10 MG tablet [id number] Yes Take 1 tablet (10 mg total) by mouth at bedtime as needed. for sleep [name], NP Taking Active           Patient Active Problem List   Diagnosis Date Noted  . Coronary artery calcification of native artery 11/10/2020  . Abdominal aortic atherosclerosis ([place]) 11/10/2020  . Oral cavity pain 01/09/2020  . Vitamin D deficiency 01/09/2020  . Diarrhea, functional 07/26/2018  . Tubular adenoma of colon 04/01/2018  . Benign neoplasm of cecum   . Encounter for screening colonoscopy   . Rectal polyp   . B12 deficiency 01/26/2018  . Chronic respiratory failure with hypoxia ([place]) 12/11/2017  . Hospital discharge follow-up 03/14/2017  . Asthma 03/04/2017  . Herpes simplex  infection 08/16/2015  . Skin neoplasm 08/16/2015  . Pulmonary hypertension ([place]) 02/06/2015  . S/P vaginal hysterectomy 06/13/2014  . Encounter for preventive health examination 06/13/2014  . Essential hypertension, benign 12/27/2013  . S/P bariatric surgery 09/20/2013  . 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 Type 2 diabetes mellitus with obesity ([place]) 03/02/2013  . Hyperlipidemia associated with type 2 diabetes mellitus ([place]) 12/17/2011  . Encounter for long-term (current) use of other medications 12/17/2011  . Cervicalgia 12/16/2011  . Sleep apnea 12/16/2011  . Obesity, Class III, BMI 40-49.9 (morbid obesity) (HCC)     Conditions to be addressed/monitored: HTN, HLD and DM  Patient Care Plan: Medication Management    Problem Identified: Diabetes, Weight Management, HTN     Long-Range Goal: Disease Progression Prevented or Minimized   Start Date: 10/10/2020  This Visit's Progress: On track  Recent Progress: On track  Priority: High  Note:   Current Barriers:  . Unable to independently afford treatment regimen . Unable to independently monitor therapeutic  efficacy . Unable to achieve control of diabetes   Pharmacist Clinical Goal(s):  [name] Over the next 90 days, patient will verbalize ability to afford treatment regimen. . Over the next 90 days, achieve adherence to monitoring guidelines and medication adherence to achieve therapeutic efficacy. . Over the next 90 days, achieve control of diabetes as evidenced by improvement in A1c through collaboration with PharmD and provider.   Interventions: . 1:1 collaboration with [name], MD regarding development and update of comprehensive plan of care as evidenced by provider attestation and co-signature . Inter-disciplinary care team collaboration (see longitudinal plan of care) . Comprehensive medication review performed; medication list updated in electronic medical record  Diabetes: . Uncontrolled; current treatment: Ozempic 0.5 mg; Tresiba 25 units daily, metformin XR 500 mg daily - denies any GI upset since starting on metformin. No diarrhea. Does note that Ozempic decreases gastric emptying such that she has more GERD at night if she eats a big meal too late.  . Current glucose readings: fasting glucose: 100-120s, post prandial glucose: 110-130s . Notes that [employer] called her, confirmed insurance information. She is waiting to hear back on their benefits investigation process.  . Discussed goal of eventual reduction in insulin burden. Increase metformin to 2 tablets daily. Continue Ozempic 0.5 mg weekly. Discussed reducing Tresiba to 22 units to reduce risk of hypoglycemia. Patient notes she worries about this. Continue current regimen, but patient will call me if she develops hypoglycemia. Patient verbalizes understanding.  . Patient assistance application for Novo pending.   Hypertension: . Uncontrolled at last clinic visit but anticipated improved; current treatment: losartan 100 mg QAM, triamterene/HCTZ 37.5/25 mg QAM, amlodipine 5 mg QPM . Continue current regimen at  this time. PCP visit next week for f/u.  Hyperlipidemia: . Uncontrolled; current treatment: atorvastatin 20 mg daily  . Recommended to continue current regimen  at this time. Will discuss dose titration of atorvastatin moving forward.   Asthma: . Controlled; current treatment: montelukast 10 mg daily, Wixela PRN, albuterol HFA PRN 1-2 times monthly. Nebulizer only required when she has respiratory infection . Recommended to continue current regimen  Cervicalgia: . Appropriately managed; current treatment: gabapentin 600 mg TID  Patient Goals/Self-Care Activities . Over the next 90 days, patient will:  - take medications as prescribed check blood glucose TID using CGM, document, and provide at future appointments collaborate with provider on medication access solutions  Follow Up Plan: Telephone follow up appointment with  care management team member scheduled for: ~4 weeks      Medication Assistance: Application for Novo ([place], [place] medication assistance program in process. Anticipated assistance start date TBD. See plan of care above for additional detail.   Plan: Telephone follow up appointment with care management team member scheduled for: ~ 4 weeks  [name], PharmD, [place], [place] Clinical Pharmacist [employer] [facility] [employer] [phone]

## 2020-12-10 ENCOUNTER — Other Ambulatory Visit: Payer: Self-pay

## 2020-12-12 ENCOUNTER — Other Ambulatory Visit: Payer: Self-pay

## 2020-12-12 ENCOUNTER — Encounter: Payer: Self-pay | Admitting: Internal Medicine

## 2020-12-12 ENCOUNTER — Ambulatory Visit (INDEPENDENT_AMBULATORY_CARE_PROVIDER_SITE_OTHER): Payer: Medicare Other | Admitting: Internal Medicine

## 2020-12-12 VITALS — BP 130/72 | HR 81 | Temp 98.4°F | Resp 15 | Ht 67.0 in | Wt 306.4 lb

## 2020-12-12 DIAGNOSIS — I700 Atherosclerosis of aorta: Secondary | ICD-10-CM

## 2020-12-12 DIAGNOSIS — E1169 Type 2 diabetes mellitus with other specified complication: Secondary | ICD-10-CM

## 2020-12-12 DIAGNOSIS — E669 Obesity, unspecified: Secondary | ICD-10-CM

## 2020-12-12 LAB — POCT GLYCOSYLATED HEMOGLOBIN (HGB A1C): Hemoglobin A1C: 5.5 % (ref 4.0–5.6)

## 2020-12-12 MED ORDER — AMLODIPINE BESYLATE 10 MG PO TABS: 10.0000 mg | ORAL_TABLET | Freq: Every day | ORAL | 1 refills | Status: DC

## 2020-12-12 NOTE — Assessment & Plan Note (Signed)
I have congratulated her in reduction of   BMI and encouraged  Continued weight loss with Ozempic.  She has lost  10% of body weight, encouraged to continue efforts  over the next 6 months using a low glycemic index diet and increase her activity to include regular exercise a minimum of 5 days per week.

## 2020-12-12 NOTE — Progress Notes (Signed)
Subjective:  Patient ID: Katie Huber, female    DOB: May 18, 1951  Age: 70 y.o. MRN: [id number]  CC: The primary encounter diagnosis was Type 2 diabetes mellitus with obesity ([place]). Diagnoses of Abdominal aortic atherosclerosis (HCC) and Obesity, Class III, BMI 40-49.9 (morbid obesity) ([place]) were also pertinent to this visit.  HPI [name] presents for follow up on diabetes and hypertension  This visit occurred during the SARS-CoV-2 public health emergency.  Safety protocols were in place, including screening questions prior to the visit, additional usage of staff PPE, and extensive cleaning of exam room while observing appropriate contact time as indicated for disinfecting solutions.   HTN:  At last visit one month ago she had reported  loss of control noted by home monitoring, may have been due to use of NSAIDS.  Added amlodipine starting at a dose of 5  Mg daily  , she  increased to 10 mg after 4 days for SBP > 150.[name]  Taking  losartan and maxzide at maximal doses.   has suspended  NSAIDs and using  2000  Mg tylenol. Instead for management of joint pain    Type 2 DM:  Insulin use has been reduced to 22 units since taking  Ozempic. Also taking  Metformin.  Blood sugars well controlled and she is losing weight and walking for exercise several times per week. .    Morbid obesity:   Has lost another 13 lbs.  She has lost 30 lbs thus far and is at her Personal best 306   Aortic atherosclerosis:  Asymptomatic.  She is Taking Lipitor. LDL at goal    Outpatient Medications Prior to Visit  Medication Sig Dispense Refill   acyclovir (ZOVIRAX) 400 MG tablet TAKE ONE TABLET BY MOUTH  FIVE TIMES DAILY (Patient taking differently: Take 400 mg by mouth as needed. TAKE ONE TABLET BY MOUTH FIVE TIMES DAILY) 450 tablet 1   albuterol (PROVENTIL HFA;VENTOLIN HFA) 108 (90 Base) MCG/ACT inhaler Inhale 2 puffs into the lungs every 6 (six) hours as needed for wheezing or shortness of breath. 3 Inhaler 1    albuterol (PROVENTIL) (2.5 MG/3ML) 0.083% nebulizer solution Take 3 mLs (2.5 mg total) by nebulization every 6 (six) hours as needed for wheezing or shortness of breath. 150 mL 1   atorvastatin (LIPITOR) 20 MG tablet TAKE ONE TABLET EVERY DAY 90 tablet 1   furosemide (LASIX) 20 MG tablet TAKE ONE TABLET EVERY DAY AS NEEDED FOR FLUID RETENTION 90 tablet 1   gabapentin (NEURONTIN) 300 MG capsule TAKE 2 CAPSULES 3 TIMES DAILY 540 capsule 3   glucose blood test strip OneTouch Verio Fex.    Test one time daily for steroid induced diabetes 100 each 0   insulin degludec (TRESIBA FLEXTOUCH) 200 UNIT/ML FlexTouch Pen Inject 26 Units into the skin daily. (Patient taking differently: Inject 22 Units into the skin daily.) 15 mL 0   Insulin Pen Needle (PEN NEEDLES) 32G X 6 MM MISC 1 application by Does not apply route daily. Use to inject insulin once daily. 100 each 3   losartan (COZAAR) 100 MG tablet TAKE ONE TABLET EVERY DAY 90 tablet 1   metFORMIN (GLUCOPHAGE-XR) 500 MG 24 hr tablet Take 2 tablets (1,000 mg total) by mouth daily. 180 tablet 3   montelukast (SINGULAIR) 10 MG tablet TAKE 1 TABLET BY MOUTH AT  BEDTIME 90 tablet 3   omeprazole (PRILOSEC) 20 MG capsule TAKE 1 CAPSULE BY MOUTH  TWICE DAILY 180 capsule 3   PARoxetine (  PAXIL) 20 MG tablet TAKE 1 TABLET BY MOUTH IN  THE MORNING 90 tablet 3   potassium chloride SA (KLOR-CON) 20 MEQ tablet TAKE 1 TABLET BY MOUTH  DAILY WHEN USING FUROSEMIDE 90 tablet 1   promethazine (PHENERGAN) 25 MG tablet TAKE 1 TABLET BY MOUTH  EVERY 8 HOURS AS NEEDED FOR NAUSEA AND VOMITING 90 tablet 0   Semaglutide,0.25 or 0.5MG /DOS, (OZEMPIC, 0.25 OR 0.5 MG/DOSE,) 2 MG/1.5ML SOPN Inject 0.5 mg into the skin once a week. 4.5 mL 3   triamterene-hydrochlorothiazide (MAXZIDE-25) 37.5-25 MG tablet TAKE ONE TABLET BY MOUTH EVERY DAY 90 tablet 0   VITAMIN D, CHOLECALCIFEROL, PO Take 1 tablet by mouth daily.     zolpidem (AMBIEN) 10 MG tablet Take 1 tablet (10 mg  total) by mouth at bedtime as needed. for sleep 90 tablet 0   amLODipine (NORVASC) 5 MG tablet Take 1 tablet (5 mg total) by mouth daily. 90 tablet 3   cyclobenzaprine (FLEXERIL) 5 MG tablet Take 1 tablet (5 mg total) by mouth 3 (three) times daily as needed. (Patient not taking: No sig reported) 15 tablet 0   Probiotic Product (PROBIOTIC DAILY PO) Take 1 capsule by mouth daily. (Patient not taking: Reported on 12/12/2020)     WIXELA INHUB 250-50 MCG/DOSE AEPB USE 1 INHALATION BY MOUTH  INTO THE LUNGS TWO TIMES  DAILY (Patient not taking: Reported on 12/12/2020) 180 each 1   Facility-Administered Medications Prior to Visit  Medication Dose Route Frequency Provider Last Rate Last Admin   pneumococcal 13-valent conjugate vaccine (PREVNAR 13) injection 0.5 mL  0.5 mL Intramuscular Once [name], MD        Review of Systems;  Patient denies headache, fevers, malaise, unintentional weight loss, skin rash, eye pain, sinus congestion and sinus pain, sore throat, dysphagia,  hemoptysis , cough, dyspnea, wheezing, chest pain, palpitations, orthopnea, edema, abdominal pain, nausea, melena, diarrhea, constipation, flank pain, dysuria, hematuria, urinary  Frequency, nocturia, numbness, tingling, seizures,  Focal weakness, Loss of consciousness,  Tremor, insomnia, depression, anxiety, and suicidal ideation.      Objective:  BP 130/72 (BP Location: Left Arm, Patient Position: Sitting, Cuff Size: Large)    Pulse 81    Temp 98.4 F (36.9 C) (Oral)    Resp 15    Ht 5\' 7"  (1.702 m)    Wt (!) 306 lb 6.4 oz (139 kg)    SpO2 96%    BMI 47.99 kg/m   BP Readings from Last 3 Encounters:  12/12/20 130/72  11/07/20 (!) 174/84  10/16/20 130/80    Wt Readings from Last 3 Encounters:  12/12/20 (!) 306 lb 6.4 oz (139 kg)  11/07/20 (!) 319 lb 6.4 oz (144.9 kg)  10/16/20 (!) 329 lb (149.2 kg)    General appearance: alert, cooperative and appears stated age Ears: normal TM's and external ear canals both  ears Throat: lips, mucosa, and tongue normal; teeth and gums normal Neck: no adenopathy, no carotid bruit, supple, symmetrical, trachea midline and thyroid not enlarged, symmetric, no tenderness/mass/nodules Back: symmetric, no curvature. ROM normal. No CVA tenderness. Lungs: clear to auscultation bilaterally Heart: regular rate and rhythm, S1, S2 normal, no murmur, click, rub or gallop Abdomen: soft, non-tender; bowel sounds normal; no masses,  no organomegaly Pulses: 2+ and symmetric Skin: Skin color, texture, turgor normal. No rashes or lesions Lymph nodes: Cervical, supraclavicular, and axillary nodes normal.  Lab Results  Component Value Date   HGBA1C 5.5 12/12/2020   HGBA1C 6.6 (H) 08/29/2020  HGBA1C 8.4 (H) 01/30/2020    Lab Results  Component Value Date   CREATININE 0.95 11/07/2020   CREATININE 0.66 08/29/2020   CREATININE 1.10 (H) 03/19/2020    Lab Results  Component Value Date   WBC 7.6 03/19/2020   HGB 14.2 03/19/2020   HCT 42.3 03/19/2020   PLT 218 03/19/2020   GLUCOSE 100 (H) 11/07/2020   CHOL 151 08/29/2020   TRIG 115.0 08/29/2020   HDL 58.40 08/29/2020   LDLDIRECT 69.0 06/05/2017   LDLCALC 70 08/29/2020   ALT 46 (H) 08/29/2020   AST 21 08/29/2020   NA 135 11/07/2020   K 4.1 11/07/2020   CL 99 11/07/2020   CREATININE 0.95 11/07/2020   BUN 11 11/07/2020   CO2 28 11/07/2020   TSH 1.08 06/08/2014   HGBA1C 5.5 12/12/2020   MICROALBUR <0.7 09/03/2020     Assessment & Plan:   Problem List Items Addressed This Visit      Unprioritized   Abdominal aortic atherosclerosis ([place])    She remains asymptomatic .  Reviewed findings of prior CT scan today..  Patient is  Tolerating tolerating atorvastatin  20 mg  daily   Lab Results  Component Value Date   CHOL 151 08/29/2020   HDL 58.40 08/29/2020   LDLCALC 70 08/29/2020   LDLDIRECT 69.0 06/05/2017   TRIG 115.0 08/29/2020   CHOLHDL 3 08/29/2020         Relevant Medications   amLODipine (NORVASC)  10 MG tablet   Obesity, Class III, BMI 40-49.9 (morbid obesity) ([place])    I have congratulated her in reduction of   BMI and encouraged  Continued weight loss with Ozempic.  She has lost  10% of body weight, encouraged to continue efforts  over the next 6 months using a low glycemic index diet and increase her activity to include regular exercise a minimum of 5 days per week.        Type 2 diabetes mellitus with obesity (HCC) - Primary    Dramatic improvement in control of diabetes and appetite with initiation of Ozempic.  Her insulin use has been reduced to 22 units. No changes today, but advised to reduce insulin dose by 4 units if she has any episodes of hypoglycemia  Lab Results  Component Value Date   HGBA1C 5.5 12/12/2020         Relevant Orders   POCT HgB A1C (Completed)      I have discontinued Kennice Messamore's Wixela Inhub, Probiotic Product (PROBIOTIC DAILY PO), and cyclobenzaprine. I have also changed her amLODipine. Additionally, I am having her maintain her glucose blood, albuterol, albuterol, montelukast, PARoxetine, omeprazole, acyclovir, (VITAMIN D, CHOLECALCIFEROL, PO), Pen Needles, losartan, atorvastatin, furosemide, promethazine, potassium chloride SA, triamterene-hydrochlorothiazide, Ozempic (0.25 or 0.5 MG/DOSE), gabapentin, zolpidem, Tresiba FlexTouch, and metFORMIN. We will continue to administer pneumococcal 13-valent conjugate vaccine.  Meds ordered this encounter  Medications   amLODipine (NORVASC) 10 MG tablet    Sig: Take 1 tablet (10 mg total) by mouth daily.    Dispense:  90 tablet    Refill:  1    Medications Discontinued During This Encounter  Medication Reason   cyclobenzaprine (FLEXERIL) 5 MG tablet    Probiotic Product (PROBIOTIC DAILY PO)    WIXELA INHUB 250-50 MCG/DOSE AEPB    amLODipine (NORVASC) 5 MG tablet      I provided  30 minutes of  face-to-face time during this encounter reviewing patient's current problems and past surgeries,  labs and imaging studies, providing  counseling on the above mentioned problems , and coordination  of care .  Follow-up: No follow-ups on file.   [name], MD

## 2020-12-12 NOTE — Assessment & Plan Note (Addendum)
She remains asymptomatic .  Reviewed findings of prior CT scan today..  Patient is  Tolerating tolerating atorvastatin  20 mg  daily   Lab Results  Component Value Date   CHOL 151 08/29/2020   HDL 58.40 08/29/2020   LDLCALC 70 08/29/2020   LDLDIRECT 69.0 06/05/2017   TRIG 115.0 08/29/2020   CHOLHDL 3 08/29/2020

## 2020-12-12 NOTE — Patient Instructions (Addendum)
  To make a low carb chip :  Take the Joseph's Lavash or Pita bread,  Or the [place] Low carb whole wheat tortilla   Place on metal cookie sheet  Brush with olive oil  Sprinkle garlic powder (NOT garlic salt), grated parmesan cheese, mediterranean seasoning , or all of them?  Bake at 275 for 30 minutes   We have substitutions for your potatoes!!  Try the mashed cauliflower and riced cauliflower dishes instead of rice and mashed potatoes  Mashed turnips are also very low carb!   For desserts :  Try the Dannon Lt n Fit greek yogurt dessert flavors and top with reddi Whip .  8 carbs,  80 calories  Try Oikos Triple Zero [place] Yogurt in the salted caramel, and the coffee flavors  With Whipped Cream for dessert  breyer's low carb ice cream, available in bars (on a stick, better ) or scoopable ice cream  HERE ARE THE LOW CARB  BREAD CHOICES       Excellent work !    See you In 3 months .  Don't change anything unless you start having cbs < 90,  Then reduce insulin by 4 units

## 2020-12-13 NOTE — Assessment & Plan Note (Signed)
Dramatic improvement in control of diabetes and appetite with initiation of Ozempic.  Her insulin use has been reduced to 22 units. No changes today, but advised to reduce insulin dose by 4 units if she has any episodes of hypoglycemia  Lab Results  Component Value Date   HGBA1C 5.5 12/12/2020

## 2020-12-20 DIAGNOSIS — E1165 Type 2 diabetes mellitus with hyperglycemia: Secondary | ICD-10-CM | POA: Diagnosis not present

## 2020-12-31 ENCOUNTER — Other Ambulatory Visit: Payer: Self-pay | Admitting: Internal Medicine

## 2021-01-08 ENCOUNTER — Ambulatory Visit
Admission: RE | Admit: 2021-01-08 | Discharge: 2021-01-08 | Disposition: A | Discharge status: Home / Self Care | Payer: Medicare Other | Source: Ambulatory Visit | Attending: Internal Medicine | Admitting: Internal Medicine

## 2021-01-08 ENCOUNTER — Other Ambulatory Visit: Payer: Self-pay

## 2021-01-08 DIAGNOSIS — Z1231 Encounter for screening mammogram for malignant neoplasm of breast: Secondary | ICD-10-CM | POA: Insufficient documentation

## 2021-01-10 ENCOUNTER — Ambulatory Visit (INDEPENDENT_AMBULATORY_CARE_PROVIDER_SITE_OTHER): Payer: Medicare Other | Admitting: Pharmacist

## 2021-01-10 VITALS — Wt 298.0 lb

## 2021-01-10 DIAGNOSIS — E1169 Type 2 diabetes mellitus with other specified complication: Secondary | ICD-10-CM | POA: Diagnosis not present

## 2021-01-10 DIAGNOSIS — E785 Hyperlipidemia, unspecified: Secondary | ICD-10-CM

## 2021-01-10 DIAGNOSIS — E669 Obesity, unspecified: Secondary | ICD-10-CM | POA: Diagnosis not present

## 2021-01-10 DIAGNOSIS — I10 Essential (primary) hypertension: Secondary | ICD-10-CM | POA: Diagnosis not present

## 2021-01-10 MED ORDER — OZEMPIC (1 MG/DOSE) 4 MG/3ML ~~LOC~~ SOPN: 1.0000 mg | PEN_INJECTOR | SUBCUTANEOUS | 0 refills | Status: DC

## 2021-01-10 NOTE — Chronic Care Management (AMB) (Signed)
Chronic Care Management Pharmacy Note  01/10/2021 Name:  Katie Huber MRN:  [id number] DOB:  1950/12/25  Subjective: Katie Huber is an 70 y.o. year old female who is a primary patient of Tullo, Aris Everts, MD.  The CCM team was consulted for assistance with disease management and care coordination needs.    Engaged with patient by telephone for follow up visit in response to provider referral for pharmacy case management and/or care coordination services.   Consent to Services:  The patient was given information about Chronic Care Management services, agreed to services, and gave verbal consent prior to initiation of services.  Please see initial visit note for detailed documentation.   Objective:  Lab Results  Component Value Date   CREATININE 0.95 11/07/2020   CREATININE 0.66 08/29/2020   CREATININE 1.10 (H) 03/19/2020    Lab Results  Component Value Date   HGBA1C 5.5 12/12/2020       Component Value Date/Time   CHOL 151 08/29/2020 1130   TRIG 115.0 08/29/2020 1130   HDL 58.40 08/29/2020 1130   CHOLHDL 3 08/29/2020 1130   VLDL 23.0 08/29/2020 1130   LDLCALC 70 08/29/2020 1130   LDLDIRECT 69.0 06/05/2017 0942     Clinical ASCVD: No  The 10-year ASCVD risk score Mikey Bussing DC Jr., et al., 2013) is: 19.3%   Values used to calculate the score:     Age: 77 years     Sex: Female     Is Non-Hispanic African American: No     Diabetic: Yes     Tobacco smoker: No     Systolic Blood Pressure: 660 mmHg     Is BP treated: Yes     HDL Cholesterol: 58.4 mg/dL     Total Cholesterol: 151 mg/dL    BP Readings from Last 3 Encounters:  12/12/20 130/72  11/07/20 (!) 174/84  10/16/20 130/80    Assessment: Review of patient past medical history, allergies, medications, health status, including review of consultants reports, laboratory and other test data, was performed as part of comprehensive evaluation and provision of chronic care management services.   SDOH:  (Social  Determinants of Health) assessments and interventions performed:    CCM Care Plan  No Known Allergies  Medications Reviewed Today    Reviewed by De Hollingshead, RPH-CPP (Pharmacist) on 01/10/21 at 1608  Med List Status: <None>  Medication Order Taking? Sig Documenting Provider Last Dose Status Informant  acyclovir (ZOVIRAX) 400 MG tablet [id number]  TAKE ONE TABLET BY MOUTH  FIVE TIMES DAILY  Patient taking differently: Take 400 mg by mouth as needed. TAKE ONE TABLET BY MOUTH FIVE TIMES DAILY   [name], MD  Active   albuterol (PROVENTIL HFA;VENTOLIN HFA) 108 (90 Base) MCG/ACT inhaler [id number] Yes Inhale 2 puffs into the lungs every 6 (six) hours as needed for wheezing or shortness of breath. Kendell Bane, NP Taking Active   albuterol (PROVENTIL) (2.5 MG/3ML) 0.083% nebulizer solution [id number] Yes Take 3 mLs (2.5 mg total) by nebulization every 6 (six) hours as needed for wheezing or shortness of breath. Kendell Bane, NP Taking Active   amLODipine (NORVASC) 10 MG tablet [id number] Yes Take 1 tablet (10 mg total) by mouth daily. [name], MD Taking Active   atorvastatin (LIPITOR) 20 MG tablet [id number] Yes TAKE ONE TABLET EVERY DAY [name], MD Taking Active   furosemide (LASIX) 20 MG tablet [id number] Yes TAKE ONE TABLET EVERY DAY AS NEEDED FOR FLUID RETENTION Tullo,  Aris Everts, MD Taking Active   gabapentin (NEURONTIN) 300 MG capsule [id number] Yes TAKE 2 CAPSULES 3 TIMES DAILY [name], MD Taking Active   glucose blood test strip [id number] Yes OneTouch Verio Fex.    Test one time daily for steroid induced diabetes [name], MD Taking Active   insulin degludec (TRESIBA FLEXTOUCH) 200 UNIT/ML FlexTouch Pen [id number] Yes Inject 26 Units into the skin daily.  Patient taking differently: Inject 22 Units into the skin daily.   [name], MD Taking Active            Med Note Nat Christen Jan 10, 2021  3:51 PM) 18 units daily    Insulin Pen Needle (PEN NEEDLES) 32G X 6 MM MISC [id number] Yes 1 application by Does not apply route daily. Use to inject insulin once daily. [name], MD Taking Active   losartan (COZAAR) 100 MG tablet [id number] Yes TAKE ONE TABLET EVERY DAY [name], MD Taking Active   metFORMIN (GLUCOPHAGE-XR) 500 MG 24 hr tablet [id number] Yes Take 2 tablets (1,000 mg total) by mouth daily. [name], MD Taking Active   montelukast (SINGULAIR) 10 MG tablet [id number] Yes TAKE 1 TABLET BY MOUTH AT  BEDTIME [name], MD Taking Active   omeprazole (PRILOSEC) 20 MG capsule [id number] Yes TAKE 1 CAPSULE BY MOUTH  TWICE DAILY [name], MD Taking Active   PARoxetine (PAXIL) 20 MG tablet [id number] Yes TAKE 1 TABLET BY MOUTH IN  THE MORNING [name], MD Taking Active   pneumococcal 13-valent conjugate vaccine (PREVNAR 13) injection 0.5 mL [id number]   [name], MD  Active   potassium chloride SA (KLOR-CON) 20 MEQ tablet [id number] Yes TAKE 1 TABLET BY MOUTH  DAILY WHEN USING FUROSEMIDE [name], MD Taking Active   promethazine (PHENERGAN) 25 MG tablet [id number] Yes TAKE 1 TABLET BY MOUTH  EVERY 8 HOURS AS NEEDED FOR NAUSEA AND VOMITING [name], MD Taking Active   Semaglutide,0.25 or 0.5MG /DOS, (OZEMPIC, 0.25 OR 0.5 MG/DOSE,) 2 MG/1.5ML SOPN [id number] Yes Inject 0.5 mg into the skin once a week. [name], MD Taking Active   triamterene-hydrochlorothiazide [facility]) 37.5-25 MG tablet [id number] Yes TAKE ONE TABLET BY MOUTH EVERY DAY [name], MD Taking Active   VITAMIN D, CHOLECALCIFEROL, PO [id number] Yes Take 1 tablet by mouth daily. [provider] Taking Active Self  zolpidem (AMBIEN) 10 MG tablet [id number] Yes Take 1 tablet (10 mg total) by mouth at bedtime as needed. for sleep [name], NP Taking Active           Patient Active Problem List   Diagnosis Date Noted  . Coronary artery calcification of native artery 11/10/2020   . Abdominal aortic atherosclerosis ([place]) 11/10/2020  . Oral cavity pain 01/09/2020  . Vitamin D deficiency 01/09/2020  . Diarrhea, functional 07/26/2018  . Tubular adenoma of colon 04/01/2018  . Benign neoplasm of cecum   . Encounter for screening colonoscopy   . Rectal polyp   . B12 deficiency 01/26/2018  . Chronic respiratory failure with hypoxia ([place]) 12/11/2017  . Hospital discharge follow-up 03/14/2017  . Asthma 03/04/2017  . Herpes simplex infection 08/16/2015  . Skin neoplasm 08/16/2015  . Pulmonary hypertension ([place]) 02/06/2015  . S/P vaginal hysterectomy 06/13/2014  . Encounter for preventive health examination 06/13/2014  . Essential hypertension, benign 12/27/2013  . S/P bariatric surgery 09/20/2013  . Insomnia due to anxiety  and fear 06/14/2013  . Type 2 diabetes mellitus with obesity ([place]) 03/02/2013  . Hyperlipidemia associated with type 2 diabetes mellitus ([place]) 12/17/2011  . Encounter for long-term (current) use of other medications 12/17/2011  . Cervicalgia 12/16/2011  . Sleep apnea 12/16/2011  . Obesity, Class III, BMI 40-49.9 (morbid obesity) ([place])     Conditions to be addressed/monitored: HTN, HLD and DMII  Care Plan : Medication Management  Updates made by De Hollingshead, RPH-CPP since 01/10/2021 12:00 AM    Problem: Diabetes, Weight Management, HTN     Long-Range Goal: Disease Progression Prevented or Minimized   Start Date: 10/10/2020  Recent Progress: On track  Priority: High  Note:   Current Barriers:  . Unable to independently afford treatment regimen . Unable to independently monitor therapeutic efficacy . Unable to achieve control of diabetes   Pharmacist Clinical Goal(s):  [name] Over the next 90 days, patient will verbalize ability to afford treatment regimen. . Over the next 90 days, achieve adherence to monitoring guidelines and medication adherence to achieve therapeutic efficacy. . Over the next 90 days, achieve control of diabetes  as evidenced by improvement in A1c through collaboration with PharmD and provider.   Interventions: . 1:1 collaboration with [name], MD regarding development and update of comprehensive plan of care as evidenced by provider attestation and co-signature . Inter-disciplinary care team collaboration (see longitudinal plan of care) . Comprehensive medication review performed; medication list updated in electronic medical record  Diabetes: . CONTROLLED; current treatment: Ozempic 0.5 mg; Tresiba 18 units daily, metformin XR 1000 mg daily  . Most recent home weight: 298 lbs (total weight loss thus far on Ozempic therapy: 34 lbs) . Current glucose readings:  Date of Download: 01/10/21 Average Glucose: 108 mg/dL for last 14 days - 99-> 104 -> 122 -> 103 Time in Goal:  - Time in range 70-180: 99% - Time above range: 1% - Time below range: 0% . Current meal patterns: continues to modify portion sizes.  . Praised for improvement in weight, glucose readings. Patient is elated by her weight loss, improvement in A1c. Discussed that Ozempic dose maximization will help to reduce insulin burden. Patient interested.  . Increase Ozempic to 1 mg weekly. Decrease Tresiba to 12 units QAM. Continued metformin XR 1000 mg daily. Can maximize metformin moving forward if needed.   Hypertension: . Controlled per last visit; current treatment: losartan 100 mg QAM, triamterene/HCTZ 37.5/25 mg QAM, amlodipine 10 mg QPM . Encouraged home BP monitoring, especially if continued weight loss . Continue current regimen at this time.   Hyperlipidemia: . Controlled; current treatment: atorvastatin 20 mg daily  . Recommended to continue current regimen  at this time.  Asthma: . Controlled; current treatment: montelukast 10 mg daily, Wixela PRN, albuterol HFA PRN 1-2 times monthly. Nebulizer only required when she has respiratory infection . Recommended to continue current regimen  given lack of  symptoms  Cervicalgia: . Appropriately managed; current treatment: gabapentin 600 mg TID . Recommend to continue current regimen at this time. Monitor for sedation.   Patient Goals/Self-Care Activities . Over the next 90 days, patient will:  - take medications as prescribed check blood glucose three times daily using CGM, document, and provide at future appointments collaborate with provider on medication access solutions  Follow Up Plan: Telephone follow up appointment with care management team member scheduled for: ~5 weeks      Medication Assistance: Ozempic, Tresiba obtained through [employer] medication assistance program.  Enrollment ends  10/30/21  Follow Up:  Patient agrees to Care Plan and Follow-up.  Plan: Telephone follow up appointment with care management team member scheduled for:  ~5 weeks  [name], PharmD, [place], [place] Clinical Pharmacist [employer] [facility] [employer] [phone]

## 2021-01-10 NOTE — Patient Instructions (Signed)
Visit Information  PATIENT GOALS: Goals Addressed            This Visit's Progress   . Monitor and Manage My Blood Sugar       Patient Goals/Self-Care Activities . Over the next 90 days, patient will:  - take medications as prescribed check blood glucose three times daily using CGM, document, and provide at future appointments collaborate with provider on medication access solutions       Patient verbalizes understanding of instructions provided today and agrees to view in [place].    Plan: Telephone follow up appointment with care management team member scheduled for:  ~5 weeks  [name], PharmD, [place], [place] Clinical Pharmacist [employer] [facility] [employer] [phone]

## 2021-01-11 ENCOUNTER — Telehealth: Payer: Self-pay | Admitting: Pharmacist

## 2021-01-11 DIAGNOSIS — E669 Obesity, unspecified: Secondary | ICD-10-CM

## 2021-01-11 MED ORDER — OZEMPIC (1 MG/DOSE) 4 MG/3ML ~~LOC~~ SOPN: 1.0000 mg | PEN_INJECTOR | SUBCUTANEOUS | 0 refills | Status: DC

## 2021-01-11 NOTE — Telephone Encounter (Signed)
Medication Samples have been labeled and placed in the refrigerator for patient to pick up  Drug name: Ozempic       Strength: 1 mg        Qty: 1 pen  LOT: [id number]  Exp.Date: 08/01/2023

## 2021-01-15 NOTE — Telephone Encounter (Signed)
Sample was picked up

## 2021-01-20 DIAGNOSIS — E1165 Type 2 diabetes mellitus with hyperglycemia: Secondary | ICD-10-CM | POA: Diagnosis not present

## 2021-01-28 ENCOUNTER — Other Ambulatory Visit: Payer: Self-pay | Admitting: Internal Medicine

## 2021-01-30 ENCOUNTER — Ambulatory Visit: Payer: Medicare Other | Admitting: Pharmacist

## 2021-01-30 DIAGNOSIS — E785 Hyperlipidemia, unspecified: Secondary | ICD-10-CM

## 2021-01-30 DIAGNOSIS — E1169 Type 2 diabetes mellitus with other specified complication: Secondary | ICD-10-CM

## 2021-01-30 DIAGNOSIS — I10 Essential (primary) hypertension: Secondary | ICD-10-CM

## 2021-01-30 MED ORDER — TRESIBA FLEXTOUCH 200 UNIT/ML ~~LOC~~ SOPN: 8.0000 [IU] | PEN_INJECTOR | Freq: Every day | SUBCUTANEOUS | 0 refills | Status: DC

## 2021-01-30 NOTE — Progress Notes (Signed)
Chronic Care Management Pharmacy Note  01/30/2021 Name:  Katie Huber MRN:  [id number] DOB:  March 15, 1951  Subjective: Katie Huber is an 70 y.o. year old female who is a primary patient of Tullo, Aris Everts, MD.  The CCM team was consulted for assistance with disease management and care coordination needs.    Engaged with patient by telephone for response to a medication management question in response to provider referral for pharmacy case management and/or care coordination services.   Consent to Services:  The patient was given information about Chronic Care Management services, agreed to services, and gave verbal consent prior to initiation of services.  Please see initial visit note for detailed documentation.   Patient Care Team: [name], MD as PCP - General (Internal Medicine) De Hollingshead, RPH-CPP (Pharmacist)  Recent office visits: None since our last call  Recent consult visits: None since our last call  Hospital visits: None in previous 6 months  Objective:  Lab Results  Component Value Date   CREATININE 0.95 11/07/2020   BUN 11 11/07/2020   GFR 61.14 11/07/2020   GFRNONAA 52 (L) 03/19/2020   GFRAA 60 (L) 03/19/2020   NA 135 11/07/2020   K 4.1 11/07/2020   CALCIUM 9.9 11/07/2020   CO2 28 11/07/2020    Lab Results  Component Value Date/Time   HGBA1C 5.5 12/12/2020 02:55 PM   HGBA1C 6.6 (H) 08/29/2020 10:23 AM   HGBA1C 8.4 (H) 01/30/2020 11:14 AM   GFR 61.14 11/07/2020 04:28 PM   GFR 88.72 08/29/2020 11:30 AM   MICROALBUR <0.7 09/03/2020 03:09 PM   MICROALBUR <0.7 01/28/2019 01:15 PM    Last diabetic Eye exam:  Lab Results  Component Value Date/Time   HMDIABEYEEXA No Retinopathy 05/21/2020 12:00 AM    Last diabetic Foot exam:  Lab Results  Component Value Date/Time   HMDIABFOOTEX normal 06/13/2014 12:00 AM     Lab Results  Component Value Date   CHOL 151 08/29/2020   HDL 58.40 08/29/2020   LDLCALC 70 08/29/2020   LDLDIRECT  69.0 06/05/2017   TRIG 115.0 08/29/2020   CHOLHDL 3 08/29/2020    Hepatic Function Latest Ref Rng & Units 08/29/2020 01/30/2020 01/24/2019  Total Protein 6.0 - 8.3 g/dL 6.7 7.0 6.9  Albumin 3.5 - 5.2 g/dL 4.2 3.9 4.1  AST 0 - 37 U/L '21 15 24  ' ALT 0 - 35 U/L 46(H) 14 38(H)  Alk Phosphatase 39 - 117 U/L 87 75 102  Total Bilirubin 0.2 - 1.2 mg/dL 0.6 0.7 0.5  Bilirubin, Direct 0.0 - 0.3 mg/dL - - -    Lab Results  Component Value Date/Time   TSH 1.08 06/08/2014 10:56 AM   TSH 1.05 02/11/2013 09:48 AM    CBC Latest Ref Rng & Units 03/19/2020 01/30/2020 03/09/2017  WBC 4.0 - 10.5 K/uL 7.6 4.5 5.7  Hemoglobin 12.0 - 15.0 g/dL 14.2 13.4 13.0  Hematocrit 36.0 - 46.0 % 42.3 40.1 38.6  Platelets 150 - 400 K/uL 218 217.0 196    Lab Results  Component Value Date/Time   VD25OH 19.07 (L) 01/30/2020 11:14 AM   VD25OH 26.56 (L) 06/23/2016 11:44 AM    Clinical ASCVD: No  The 10-year ASCVD risk score Mikey Bussing DC Jr., et al., 2013) is: 19.3%   Values used to calculate the score:     Age: 48 years     Sex: Female     Is Non-Hispanic African American: No     Diabetic: Yes  Tobacco smoker: No     Systolic Blood Pressure: 353 mmHg     Is BP treated: Yes     HDL Cholesterol: 58.4 mg/dL     Total Cholesterol: 151 mg/dL    Depression screen [facility] 2/9 02/02/2020 10/25/2019 04/11/2019  Decreased Interest 0 0 0  Down, Depressed, Hopeless 0 0 0  PHQ - 2 Score 0 0 0  Altered sleeping - - 0  Tired, decreased energy - - 1  Change in appetite - - 0  Feeling bad or failure about yourself  - - 0  Trouble concentrating - - 0  Moving slowly or fidgety/restless - - 0  Suicidal thoughts - - 0  PHQ-9 Score - - 1  Difficult doing work/chores - - Not difficult at all    Social History   Tobacco Use  Smoking Status Never Smoker  Smokeless Tobacco Never Used   BP Readings from Last 3 Encounters:  12/12/20 130/72  11/07/20 (!) 174/84  10/16/20 130/80   Pulse Readings from Last 3 Encounters:  12/12/20 81   11/07/20 75  10/16/20 67   Wt Readings from Last 3 Encounters:  01/10/21 298 lb (135.2 kg)  12/12/20 (!) 306 lb 6.4 oz (139 kg)  11/07/20 (!) 319 lb 6.4 oz (144.9 kg)    Assessment/Interventions: Review of patient past medical history, allergies, medications, health status, including review of consultants reports, laboratory and other test data, was performed as part of comprehensive evaluation and provision of chronic care management services.   SDOH:  (Social Determinants of Health) assessments and interventions performed: Yes SDOH Interventions   Flowsheet Row Most Recent Value  SDOH Interventions   Financial Strain Interventions Other (Comment)  [manufacturer assistance]      CCM Care Plan  No Known Allergies  Medications Reviewed Today    Reviewed by De Hollingshead, RPH-CPP (Pharmacist) on 01/10/21 at 1608  Med List Status: <None>  Medication Order Taking? Sig Documenting Provider Last Dose Status Informant  acyclovir (ZOVIRAX) 400 MG tablet [id number]  TAKE ONE TABLET BY MOUTH  FIVE TIMES DAILY  Patient taking differently: Take 400 mg by mouth as needed. TAKE ONE TABLET BY MOUTH FIVE TIMES DAILY   [name], MD  Active   albuterol (PROVENTIL HFA;VENTOLIN HFA) 108 (90 Base) MCG/ACT inhaler [id number] Yes Inhale 2 puffs into the lungs every 6 (six) hours as needed for wheezing or shortness of breath. Kendell Bane, NP Taking Active   albuterol (PROVENTIL) (2.5 MG/3ML) 0.083% nebulizer solution [id number] Yes Take 3 mLs (2.5 mg total) by nebulization every 6 (six) hours as needed for wheezing or shortness of breath. Kendell Bane, NP Taking Active   amLODipine (NORVASC) 10 MG tablet [id number] Yes Take 1 tablet (10 mg total) by mouth daily. [name], MD Taking Active   atorvastatin (LIPITOR) 20 MG tablet [id number] Yes TAKE ONE TABLET EVERY DAY [name], MD Taking Active   furosemide (LASIX) 20 MG tablet [id number] Yes TAKE ONE TABLET EVERY DAY AS NEEDED  FOR FLUID RETENTION [name], MD Taking Active   gabapentin (NEURONTIN) 300 MG capsule [id number] Yes TAKE 2 CAPSULES 3 TIMES DAILY [name], MD Taking Active   glucose blood test strip [id number] Yes OneTouch Verio Fex.    Test one time daily for steroid induced diabetes [name], MD Taking Active   insulin degludec (TRESIBA FLEXTOUCH) 200 UNIT/ML FlexTouch Pen [id number] Yes Inject 26 Units into the skin daily.  Patient taking differently:  Inject 22 Units into the skin daily.   [name], MD Taking Active            Med Note Nat Christen Jan 10, 2021  3:51 PM) 18 units daily   Insulin Pen Needle (PEN NEEDLES) 32G X 6 MM MISC [id number] Yes 1 application by Does not apply route daily. Use to inject insulin once daily. [name], MD Taking Active   losartan (COZAAR) 100 MG tablet [id number] Yes TAKE ONE TABLET EVERY DAY [name], MD Taking Active   metFORMIN (GLUCOPHAGE-XR) 500 MG 24 hr tablet [id number] Yes Take 2 tablets (1,000 mg total) by mouth daily. [name], MD Taking Active   montelukast (SINGULAIR) 10 MG tablet [id number] Yes TAKE 1 TABLET BY MOUTH AT  BEDTIME [name], MD Taking Active   omeprazole (PRILOSEC) 20 MG capsule [id number] Yes TAKE 1 CAPSULE BY MOUTH  TWICE DAILY [name], MD Taking Active   PARoxetine (PAXIL) 20 MG tablet [id number] Yes TAKE 1 TABLET BY MOUTH IN  THE MORNING [name], MD Taking Active   pneumococcal 13-valent conjugate vaccine (PREVNAR 13) injection 0.5 mL [id number]   [name], MD  Active   potassium chloride SA (KLOR-CON) 20 MEQ tablet [id number] Yes TAKE 1 TABLET BY MOUTH  DAILY WHEN USING FUROSEMIDE [name], MD Taking Active   promethazine (PHENERGAN) 25 MG tablet [id number] Yes TAKE 1 TABLET BY MOUTH  EVERY 8 HOURS AS NEEDED FOR NAUSEA AND VOMITING [name], MD Taking Active   Semaglutide,0.25 or 0.5MG/DOS, (OZEMPIC, 0.25 OR 0.5 MG/DOSE,) 2 MG/1.5ML SOPN [id number]  Yes Inject 0.5 mg into the skin once a week. [name], MD Taking Active   triamterene-hydrochlorothiazide [facility]) 37.5-25 MG tablet [id number] Yes TAKE ONE TABLET BY MOUTH EVERY DAY [name], MD Taking Active   VITAMIN D, CHOLECALCIFEROL, PO [id number] Yes Take 1 tablet by mouth daily. [provider] Taking Active Self  zolpidem (AMBIEN) 10 MG tablet [id number] Yes Take 1 tablet (10 mg total) by mouth at bedtime as needed. for sleep [name], NP Taking Active           Patient Active Problem List   Diagnosis Date Noted  . Coronary artery calcification of native artery 11/10/2020  . Abdominal aortic atherosclerosis ([place]) 11/10/2020  . Oral cavity pain 01/09/2020  . Vitamin D deficiency 01/09/2020  . Diarrhea, functional 07/26/2018  . Tubular adenoma of colon 04/01/2018  . Benign neoplasm of cecum   . Encounter for screening colonoscopy   . Rectal polyp   . B12 deficiency 01/26/2018  . Chronic respiratory failure with hypoxia ([place]) 12/11/2017  . Hospital discharge follow-up 03/14/2017  . Asthma 03/04/2017  . Herpes simplex infection 08/16/2015  . Skin neoplasm 08/16/2015  . Pulmonary hypertension ([place]) 02/06/2015  . S/P vaginal hysterectomy 06/13/2014  . Encounter for preventive health examination 06/13/2014  . Essential hypertension, benign 12/27/2013  . S/P bariatric surgery 09/20/2013  . 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 Type 2 diabetes mellitus with obesity ([place]) 03/02/2013  . Hyperlipidemia associated with type 2 diabetes mellitus ([place]) 12/17/2011  . Encounter for long-term (current) use of other medications 12/17/2011  . Cervicalgia 12/16/2011  . Sleep apnea 12/16/2011  . Obesity, Class III, BMI 40-49.9 (morbid obesity) ([place])     Immunization History  Administered Date(s) Administered  . Fluad Quad(high Dose 65+) 08/31/2019, 09/03/2020  . Influenza, High Dose Seasonal  PF 09/09/2017, 02/09/2019  . PFIZER(Purple  Top)SARS-COV-2 Vaccination 01/26/2020, 02/16/2020, 10/11/2020  . Pneumococcal Conjugate-13 02/06/2015  . Pneumococcal Polysaccharide-23 09/20/2010, 09/09/2017  . Tdap 09/20/2013  . Zoster 06/13/2014  . Zoster Recombinat (Shingrix) 12/12/2020    Conditions to be addressed/monitored:  Hypertension, Hyperlipidemia and Diabetes  Care Plan : Medication Management  Updates made by De Hollingshead, RPH-CPP since 01/30/2021 12:00 AM    Problem: Diabetes, Weight Management, HTN     Long-Range Goal: Disease Progression Prevented or Minimized   Start Date: 10/10/2020  Recent Progress: On track  Priority: High  Note:   Current Barriers:  . Unable to independently afford treatment regimen . Unable to independently monitor therapeutic efficacy . Unable to achieve control of diabetes   Pharmacist Clinical Goal(s):  [name] Over the next 90 days, patient will verbalize ability to afford treatment regimen. . Over the next 90 days, achieve adherence to monitoring guidelines and medication adherence to achieve therapeutic efficacy. . Over the next 90 days, achieve control of diabetes as evidenced by improvement in A1c through collaboration with PharmD and provider.   Interventions: . 1:1 collaboration with [name], MD regarding development and update of comprehensive plan of care as evidenced by provider attestation and co-signature . Inter-disciplinary care team collaboration (see longitudinal plan of care) . Comprehensive medication review performed; medication list updated in electronic medical record  Diabetes: . CONTROLLED; current treatment: Ozempic 1 mg weekly; Tresiba 12 units daily, metformin XR 1000 mg daily  . Most recent home weight: 298 lbs (total weight loss thus far on Ozempic therapy: 34 lbs) . Current glucose readings: reports that she is having low glucose readings (60s) after supper. Low glucose alarm on [address] is alerting her to it.  . Reduce Tresiba to 8 units daily.  Continue metformin XR 1000 mg daily and Ozempic 1 mg weekly. F/u call next week, will likely be able to d/c insulin.  Hypertension: . Controlled per last visit; current treatment: losartan 100 mg QAM, triamterene/HCTZ 37.5/25 mg QAM, amlodipine 10 mg QPM . Encouraged home BP monitoring, especially if continued weight loss . Continue current regimen at this time.   Hyperlipidemia: . Controlled; current treatment: atorvastatin 20 mg daily  . Recommended to continue current regimen  at this time.  Asthma: . Controlled; current treatment: montelukast 10 mg daily, Wixela PRN, albuterol HFA PRN 1-2 times monthly. Nebulizer only required when she has respiratory infection . Recommended to continue current regimen  given lack of symptoms  Cervicalgia: . Appropriately managed; current treatment: gabapentin 600 mg TID . Recommend to continue current regimen at this time. Monitor for sedation.   Patient Goals/Self-Care Activities . Over the next 90 days, patient will:  - take medications as prescribed check blood glucose three times daily using CGM, document, and provide at future appointments collaborate with provider on medication access solutions  Follow Up Plan: Telephone follow up appointment with care management team member scheduled for: ~1 week as previously scheduled        Medication Assistance: Joni Reining obtained through [employer] medication assistance program.  Enrollment ends 10/30/21  Patient's preferred pharmacy is:  [place], [place] [address] [place] [address] Phone: [phone] Fax: [phone]  Care Plan and Follow Up Patient Decision:  Patient agrees to Care Plan and Follow-up.  Plan: Telephone follow up appointment with care management team member scheduled for:  ~ 1 week as previously scheduled  [name], PharmD, [place],  CPP Clinical Probation officer at [place]

## 2021-01-30 NOTE — Patient Instructions (Signed)
Visit Information  PATIENT GOALS: Goals Addressed            This Visit's Progress   . Monitor and Manage My Blood Sugar       Patient Goals/Self-Care Activities . Over the next 90 days, patient will:  - take medications as prescribed check blood glucose three times daily using CGM, document, and provide at future appointments collaborate with provider on medication access solutions        The patient verbalized understanding of instructions, educational materials, and care plan provided today and declined offer to receive copy of patient instructions, educational materials, and care plan.   Plan: Telephone follow up appointment with care management team member scheduled for:  ~ 1 week as previously scheduled  [name], PharmD, [place], [place] Clinical Pharmacist [employer] at [employer] [phone]

## 2021-01-31 ENCOUNTER — Telehealth: Payer: Self-pay

## 2021-01-31 NOTE — Telephone Encounter (Signed)
Spoke with pt to let her know that we have received her patient assistance medications, tresiba and ozempic and that they are ready to be picked up. Pt gave a verbal understanding.

## 2021-02-05 ENCOUNTER — Telehealth: Payer: Self-pay

## 2021-02-05 ENCOUNTER — Ambulatory Visit (INDEPENDENT_AMBULATORY_CARE_PROVIDER_SITE_OTHER): Payer: Medicare Other

## 2021-02-05 VITALS — Ht 67.0 in | Wt 298.0 lb

## 2021-02-05 DIAGNOSIS — Z0000 Encounter for general adult medical examination without abnormal findings: Secondary | ICD-10-CM | POA: Diagnosis not present

## 2021-02-05 NOTE — Patient Instructions (Addendum)
[name] , Thank you for taking time to come for your Medicare Wellness Visit. I appreciate your ongoing commitment to your health goals. Please review the following plan we discussed and let me know if I can assist you in the future.   These are the goals we discussed: Goals      Patient Stated   .  Increase physical activity (pt-stated)      Stay active      Other   .  Monitor and Manage My Blood Sugar      Patient Goals/Self-Care Activities . Over the next 90 days, patient will:  - take medications as prescribed check blood glucose three times daily using CGM, document, and provide at future appointments collaborate with provider on medication access solutions        This is a list of the screening recommended for you and due dates:  Health Maintenance  Topic Date Due  . Eye exam for diabetics  05/21/2021  . Hemoglobin A1C  06/11/2021  . Complete foot exam   09/03/2021  . Mammogram  01/08/2022  . Colon Cancer Screening  03/31/2023  . Tetanus Vaccine  09/21/2023  . Flu Shot  Completed  . DEXA scan (bone density measurement)  Completed  . COVID-19 Vaccine  Completed  .  Hepatitis C: One time screening is recommended by Center for Disease Control  (CDC) for  adults born from [birth year] through 1965.   Completed  . Pneumonia vaccines  Completed  . HPV Vaccine  Aged Out    Immunizations Immunization History  Administered Date(s) Administered  . Fluad Quad(high Dose 65+) 08/31/2019, 09/03/2020  . Influenza, High Dose Seasonal PF 09/09/2017, 02/09/2019  . PFIZER(Purple Top)SARS-COV-2 Vaccination 01/26/2020, 02/16/2020, 10/11/2020  . Pneumococcal Conjugate-13 02/06/2015  . Pneumococcal Polysaccharide-23 09/20/2010, 09/09/2017  . Tdap 09/20/2013  . Zoster 06/13/2014  . Zoster Recombinat (Shingrix) 12/12/2020   Keep all routine maintenance appointments.   CCM 02/07/21 @ 2:15  Follow up 03/04/21 @ 2:00  Follow up 06/11/21 @ 2:00  Advanced directives: End of life  planning; Advance aging; Advanced directives discussed.  Copy of current HCPOA/Living Will requested.    Conditions/risks identified: none new.   Follow up in one year for your annual wellness visit.   Preventive Care 52 Years and Older, Female Preventive care refers to lifestyle choices and visits with your health care provider that can promote health and wellness. What does preventive care include?  A yearly physical exam. This is also called an annual well check.  Dental exams once or twice a year.  Routine eye exams. Ask your health care provider how often you should have your eyes checked.  Personal lifestyle choices, including:  Daily care of your teeth and gums.  Regular physical activity.  Eating a healthy diet.  Avoiding tobacco and drug use.  Limiting alcohol use.  Practicing safe sex.  Taking low-dose aspirin every day.  Taking vitamin and mineral supplements as recommended by your health care provider. What happens during an annual well check? The services and screenings done by your health care provider during your annual well check will depend on your age, overall health, lifestyle risk factors, and family history of disease. Counseling  Your health care provider may ask you questions about your:  Alcohol use.  Tobacco use.  Drug use.  Emotional well-being.  Home and relationship well-being.  Sexual activity.  Eating habits.  History of falls.  Memory and ability to understand (cognition).  Work and work Statistician.  Reproductive health. Screening  You may have the following tests or measurements:  Height, weight, and BMI.  Blood pressure.  Lipid and cholesterol levels. These may be checked every 5 years, or more frequently if you are over 17 years old.  Skin check.  Lung cancer screening. You may have this screening every year starting at age 53 if you have a 30-pack-year history of smoking and currently smoke or have quit within  the past 15 years.  Fecal occult blood test (FOBT) of the stool. You may have this test every year starting at age 14.  Flexible sigmoidoscopy or colonoscopy. You may have a sigmoidoscopy every 5 years or a colonoscopy every 10 years starting at age 32.  Hepatitis C blood test.  Hepatitis B blood test.  Sexually transmitted disease (STD) testing.  Diabetes screening. This is done by checking your blood sugar (glucose) after you have not eaten for a while (fasting). You may have this done every 1-3 years.  Bone density scan. This is done to screen for osteoporosis. You may have this done starting at age 34.  Mammogram. This may be done every 1-2 years. Talk to your health care provider about how often you should have regular mammograms. Talk with your health care provider about your test results, treatment options, and if necessary, the need for more tests. Vaccines  Your health care provider may recommend certain vaccines, such as:  Influenza vaccine. This is recommended every year.  Tetanus, diphtheria, and acellular pertussis (Tdap, Td) vaccine. You may need a Td booster every 10 years.  Zoster vaccine. You may need this after age 66.  Pneumococcal 13-valent conjugate (PCV13) vaccine. One dose is recommended after age 39.  Pneumococcal polysaccharide (PPSV23) vaccine. One dose is recommended after age 30. Talk to your health care provider about which screenings and vaccines you need and how often you need them. This information is not intended to replace advice given to you by your health care provider. Make sure you discuss any questions you have with your health care provider. Document Released: 12/14/2015 Document Revised: 08/06/2016 Document Reviewed: 09/18/2015 Elsevier Interactive Patient Education  2017 [place] Prevention in the Home Falls can cause injuries. They can happen to people of all ages. There are many things you can do to make your home safe and to  help prevent falls. What can I do on the outside of my home?  Regularly fix the edges of walkways and driveways and fix any cracks.  Remove anything that might make you trip as you walk through a door, such as a raised step or threshold.  Trim any bushes or trees on the path to your home.  Use bright outdoor lighting.  Clear any walking paths of anything that might make someone trip, such as rocks or tools.  Regularly check to see if handrails are loose or broken. Make sure that both sides of any steps have handrails.  Any raised decks and porches should have guardrails on the edges.  Have any leaves, snow, or ice cleared regularly.  Use sand or salt on walking paths during winter.  Clean up any spills in your garage right away. This includes oil or grease spills. What can I do in the bathroom?  Use night lights.  Install grab bars by the toilet and in the tub and shower. Do not use towel bars as grab bars.  Use non-skid mats or decals in the tub or shower.  If you need to sit  down in the shower, use a plastic, non-slip stool.  Keep the floor dry. Clean up any water that spills on the floor as soon as it happens.  Remove soap buildup in the tub or shower regularly.  Attach bath mats securely with double-sided non-slip rug tape.  Do not have throw rugs and other things on the floor that can make you trip. What can I do in the bedroom?  Use night lights.  Make sure that you have a light by your bed that is easy to reach.  Do not use any sheets or blankets that are too big for your bed. They should not hang down onto the floor.  Have a firm chair that has side arms. You can use this for support while you get dressed.  Do not have throw rugs and other things on the floor that can make you trip. What can I do in the kitchen?  Clean up any spills right away.  Avoid walking on wet floors.  Keep items that you use a lot in easy-to-reach places.  If you need to reach  something above you, use a strong step stool that has a grab bar.  Keep electrical cords out of the way.  Do not use floor polish or wax that makes floors slippery. If you must use wax, use non-skid floor wax.  Do not have throw rugs and other things on the floor that can make you trip. What can I do with my stairs?  Do not leave any items on the stairs.  Make sure that there are handrails on both sides of the stairs and use them. Fix handrails that are broken or loose. Make sure that handrails are as long as the stairways.  Check any carpeting to make sure that it is firmly attached to the stairs. Fix any carpet that is loose or worn.  Avoid having throw rugs at the top or bottom of the stairs. If you do have throw rugs, attach them to the floor with carpet tape.  Make sure that you have a light switch at the top of the stairs and the bottom of the stairs. If you do not have them, ask someone to add them for you. What else can I do to help prevent falls?  Wear shoes that:  Do not have high heels.  Have rubber bottoms.  Are comfortable and fit you well.  Are closed at the toe. Do not wear sandals.  If you use a stepladder:  Make sure that it is fully opened. Do not climb a closed stepladder.  Make sure that both sides of the stepladder are locked into place.  Ask someone to hold it for you, if possible.  Clearly mark and make sure that you can see:  Any grab bars or handrails.  First and last steps.  Where the edge of each step is.  Use tools that help you move around (mobility aids) if they are needed. These include:  Canes.  Walkers.  Scooters.  Crutches.  Turn on the lights when you go into a dark area. Replace any light bulbs as soon as they burn out.  Set up your furniture so you have a clear path. Avoid moving your furniture around.  If any of your floors are uneven, fix them.  If there are any pets around you, be aware of where they are.  Review  your medicines with your doctor. Some medicines can make you feel dizzy. This can increase your chance of  falling. Ask your doctor what other things that you can do to help prevent falls. This information is not intended to replace advice given to you by your health care provider. Make sure you discuss any questions you have with your health care provider. Document Released: 09/13/2009 Document Revised: 04/24/2016 Document Reviewed: 12/22/2014 Elsevier Interactive Patient Education  2017 [employer].

## 2021-02-05 NOTE — Progress Notes (Signed)
Subjective:   Katie Huber is a 70 y.o. female who presents for Medicare Annual (Subsequent) preventive examination.  Review of Systems    No ROS.  Medicare Wellness Virtual Visit.    Cardiac Risk Factors include: advanced age (>35men, >39 women);hypertension;diabetes mellitus     Objective:    Today's Vitals   02/05/21 1234  Weight: 298 lb (135.2 kg)  Height: 5\' 7"  (1.702 m)   Body mass index is 46.67 kg/m.  Advanced Directives 02/05/2021 03/19/2020 02/02/2020 01/28/2019 03/30/2018 09/09/2017 03/04/2017  Does Patient Have a Medical Advance Directive? Yes No Yes Yes Yes Yes Yes  Type of Paramedic of [place];Living will - [place];Living will [place];Living will - [place];Living will [place];Living will  Does patient want to make changes to medical advance directive? No - Patient declined - No - Patient declined No - Patient declined - No - Patient declined No - Patient declined  Copy of [place] in Chart? No - copy requested - No - copy requested No - copy requested - No - copy requested No - copy requested  Pre-existing out of facility DNR order (yellow form or pink MOST form) - - - - Yellow form placed in chart (order not valid for inpatient use) - -    Current Medications (verified) Outpatient Encounter Medications as of 02/05/2021  Medication Sig  . acyclovir (ZOVIRAX) 400 MG tablet TAKE ONE TABLET BY MOUTH  FIVE TIMES DAILY (Patient taking differently: Take 400 mg by mouth as needed. TAKE ONE TABLET BY MOUTH FIVE TIMES DAILY)  . albuterol (PROVENTIL HFA;VENTOLIN HFA) 108 (90 Base) MCG/ACT inhaler Inhale 2 puffs into the lungs every 6 (six) hours as needed for wheezing or shortness of breath.  [name] albuterol (PROVENTIL) (2.5 MG/3ML) 0.083% nebulizer solution Take 3 mLs (2.5 mg total) by nebulization every 6 (six) hours as needed for wheezing or shortness of  breath.  [name] amLODipine (NORVASC) 10 MG tablet Take 1 tablet (10 mg total) by mouth daily.  [name] atorvastatin (LIPITOR) 20 MG tablet TAKE ONE TABLET EVERY DAY  . furosemide (LASIX) 20 MG tablet TAKE ONE TABLET EVERY DAY AS NEEDED FOR FLUID RETENTION  . gabapentin (NEURONTIN) 300 MG capsule TAKE 2 CAPSULES 3 TIMES DAILY  . glucose blood test strip OneTouch Verio Fex.    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 insulin degludec (TRESIBA FLEXTOUCH) 200 UNIT/ML FlexTouch Pen Inject 8 Units into the skin daily.  . Insulin Pen Needle (PEN NEEDLES) 32G X 6 MM MISC 1 application by Does not apply route daily. Use to inject insulin once daily.  [name] losartan (COZAAR) 100 MG tablet TAKE ONE TABLET EVERY DAY  . metFORMIN (GLUCOPHAGE-XR) 500 MG 24 hr tablet Take 2 tablets (1,000 mg total) by mouth daily.  . montelukast (SINGULAIR) 10 MG tablet TAKE 1 TABLET BY MOUTH AT  BEDTIME  . omeprazole (PRILOSEC) 20 MG capsule TAKE 1 CAPSULE BY MOUTH  TWICE DAILY  . PARoxetine (PAXIL) 20 MG tablet TAKE ONE TABLET EVERY MORNING  . potassium chloride SA (KLOR-CON) 20 MEQ tablet TAKE 1 TABLET BY MOUTH  DAILY WHEN USING FUROSEMIDE  . promethazine (PHENERGAN) 25 MG tablet TAKE 1 TABLET BY MOUTH  EVERY 8 HOURS AS NEEDED FOR NAUSEA AND VOMITING  . Semaglutide, 1 MG/DOSE, (OZEMPIC, 1 MG/DOSE,) 4 MG/3ML SOPN Inject 1 mg into the skin once a week.  . triamterene-hydrochlorothiazide (MAXZIDE-25) 37.5-25 MG tablet  TAKE ONE TABLET BY MOUTH EVERY DAY  . VITAMIN D, CHOLECALCIFEROL, PO Take 1 tablet by mouth daily.  [name] zolpidem (AMBIEN) 10 MG tablet Take 1 tablet (10 mg total) by mouth at bedtime as needed. for sleep   Facility-Administered Encounter Medications as of 02/05/2021  Medication  . pneumococcal 13-valent conjugate vaccine (PREVNAR 13) injection 0.5 mL    Allergies (verified) Patient has no known allergies.   History: Past Medical History:  Diagnosis Date  . Asthma 1990  . Hypertension   . Obesity, Class III,  BMI 40-49.9 (morbid obesity) (HCC)    weight fluctuations of 100 lbs    Past Surgical History:  Procedure Laterality Date  . ABDOMINAL HYSTERECTOMY  2001  . BREAST BIOPSY Right    Neg  . BREAST EXCISIONAL BIOPSY Right 1990s   multiple hematomas  . CHOLECYSTECTOMY  1970s  . COLONOSCOPY WITH PROPOFOL N/A 03/30/2018   Procedure: COLONOSCOPY WITH PROPOFOL;  Surgeon: Lucilla Lame, MD;  Location: [facility] ENDOSCOPY;  Service: Endoscopy;  Laterality: N/A;  . JOINT REPLACEMENT     Bilateral  . STOMACH SURGERY  [phone]   gastric partitionin( for obesity)    Family History  Problem Relation Age of Onset  . Heart disease Father   . COPD Father   . Hyperlipidemia Father   . Mental illness Brother   . Breast cancer Neg Hx    Social History   Socioeconomic History  . Marital status: Single    Spouse name: Not on file  . Number of children: Not on file  . Years of education: Not on file  . Highest education level: Not on file  Occupational History  . Not on file  Tobacco Use  . Smoking status: Never Smoker  . Smokeless tobacco: Never Used  Vaping Use  . Vaping Use: Never used  Substance and Sexual Activity  . Alcohol use: Yes    Comment: ocassionally  . Drug use: No  . Sexual activity: Never  Other Topics Concern  . Not on file  Social History Narrative  . Not on file   Social Determinants of Health   Financial Resource Strain: Medium Risk  . Difficulty of Paying Living Expenses: Somewhat hard  Food Insecurity: No Food Insecurity  . Worried About Charity fundraiser in the Last Year: Never true  . Ran Out of Food in the Last Year: Never true  Transportation Needs: No Transportation Needs  . Lack of Transportation (Medical): No  . Lack of Transportation (Non-Medical): No  Physical Activity: Not on file  Stress: No Stress Concern Present  . Feeling of Stress : Not at all  Social Connections: Unknown  . Frequency of Communication with Friends and Family: More than three times  a week  . Frequency of Social Gatherings with Friends and Family: More than three times a week  . Attends Religious Services: More than 4 times per year  . Active Member of Clubs or Organizations: Yes  . Attends Archivist Meetings: Not on file  . Marital Status: Not on file    Tobacco Counseling Counseling given: Not Answered   Clinical Intake:  Pre-visit preparation completed: Yes        Diabetes: Yes (Followed by pcp)  How often do you need to have someone help you when you read instructions, pamphlets, or other written materials from your doctor or pharmacy?: 1 - Never   Interpreter Needed?: No      Activities of Daily Living In your present  state of health, do you have any difficulty performing the following activities: 02/05/2021  Hearing? N  Vision? N  Difficulty concentrating or making decisions? N  Walking or climbing stairs? Y  Dressing or bathing? N  Doing errands, shopping? N  Preparing Food and eating ? N  Using the Toilet? N  In the past six months, have you accidently leaked urine? N  Do you have problems with loss of bowel control? N  Managing your Medications? N  Managing your Finances? N  Housekeeping or managing your Housekeeping? N  Some recent data might be hidden    Patient Care Team: [name], MD as PCP - General (Internal Medicine) De Hollingshead, RPH-CPP (Pharmacist)  Indicate any recent Medical Services you may have received from other than [facility] providers in the past year (date may be approximate).     Assessment:   This is a routine wellness examination for [place].  I connected with Lucia today by telephone and verified that I am speaking with the correct person using two identifiers. Location patient: home Location provider: work Persons participating in the virtual visit: patient, Marine scientist.    I discussed the limitations, risks, security and privacy concerns of performing an evaluation and management service  by telephone and the availability of in person appointments. The patient expressed understanding and verbally consented to this telephonic visit.    Interactive audio and video telecommunications were attempted between this provider and patient, however failed, due to patient having technical difficulties OR patient did not have access to video capability.  We continued and completed visit with audio only.  Some vital signs may be absent or patient reported.   Hearing/Vision screen  Hearing Screening   125Hz  250Hz  500Hz  1000Hz  2000Hz  3000Hz  4000Hz  6000Hz  8000Hz   Right ear:           Left ear:           Comments: Patient is able to hear conversational tones without difficulty.  No issues reported.  Vision Screening Comments: Wears corrective lenses  Visual acuity not assessed, virtual visit. They have seen their ophthalmologist.   Dietary issues and exercise activities discussed: Current Exercise Habits: Home exercise routine, Type of exercise: walking, Intensity: Mild  Healthy diet Good water intake  Goals      Patient Stated   .  Increase physical activity (pt-stated)      Stay active      Other   .  Monitor and Manage My Blood Sugar      Patient Goals/Self-Care Activities . Over the next 90 days, patient will:  - take medications as prescribed check blood glucose three times daily using CGM, document, and provide at future appointments collaborate with provider on medication access solutions       Depression Screen PHQ 2/9 Scores 02/05/2021 02/02/2020 10/25/2019 04/11/2019 01/28/2019 12/04/2017 09/09/2017  PHQ - 2 Score 0 0 0 0 0 0 0  PHQ- 9 Score - - - 1 - - -    Fall Risk Fall Risk  02/05/2021 12/12/2020 11/07/2020 09/03/2020 02/29/2020  Falls in the past year? 0 0 1 1 0  Comment - - - - -  Number falls in past yr: 0 - 1 1 -  Injury with Fall? 0 - 0 0 -  Risk for fall due to : - - History of fall(s) - -  Follow up Falls evaluation completed Falls evaluation completed Falls  evaluation completed Falls evaluation completed Falls evaluation completed    FALL  RISK PREVENTION PERTAINING TO THE HOME: Handrails in use when climbing stairs? Yes Home free of loose throw rugs in walkways, pet beds, electrical cords, etc? Yes  Adequate lighting in your home to reduce risk of falls? Yes   ASSISTIVE DEVICES UTILIZED TO PREVENT FALLS: Use of a cane, walker or w/c? No   TIMED UP AND GO: Was the test performed? No . Virtual visit.   Cognitive Function: MMSE - Mini Mental State Exam 01/28/2019  Orientation to time 5  Orientation to Place 5  Registration 3  Attention/ Calculation 5  Recall 3  Language- name 2 objects 2  Language- repeat 1  Language- follow 3 step command 3  Language- read & follow direction 1  Write a sentence 1  Copy design 1  Total score 30     6CIT Screen 02/02/2020 09/09/2017  What Year? 0 points 0 points  What month? 0 points 0 points  What time? 0 points 0 points  Count back from 20 - 0 points  Months in reverse - 0 points  Repeat phrase - 0 points  Total Score - 0    Immunizations Immunization History  Administered Date(s) Administered  . Fluad Quad(high Dose 65+) 08/31/2019, 09/03/2020  . Influenza, High Dose Seasonal PF 09/09/2017, 02/09/2019  . PFIZER(Purple Top)SARS-COV-2 Vaccination 01/26/2020, 02/16/2020, 10/11/2020  . Pneumococcal Conjugate-13 02/06/2015  . Pneumococcal Polysaccharide-23 09/20/2010, 09/09/2017  . Tdap 09/20/2013  . Zoster 06/13/2014  . Zoster Recombinat (Shingrix) 12/12/2020   Health Maintenance There are no preventive care reminders to display for this patient. Health Maintenance  Topic Date Due  . OPHTHALMOLOGY EXAM  05/21/2021  . HEMOGLOBIN A1C  06/11/2021  . FOOT EXAM  09/03/2021  . MAMMOGRAM  01/08/2022  . COLONOSCOPY (Pts 45-13yrs Insurance coverage will need to be confirmed)  03/31/2023  . TETANUS/TDAP  09/21/2023  . INFLUENZA VACCINE  Completed  . DEXA SCAN  Completed  . COVID-19 Vaccine   Completed  . Hepatitis C Screening  Completed  . PNA vac Low Risk Adult  Completed  . HPV VACCINES  Aged Out   Colorectal cancer screening: Type of screening: Colonoscopy. Completed 03/30/18. Repeat every 5 years  Mammogram status: Completed 01/08/21. Repeat every year   Lung Cancer Screening: (Low Dose CT Chest recommended if Age 52-80 years, 30 pack-year currently smoking OR have quit w/in 15years.) does not qualify.   Vision Screening: Recommended annual ophthalmology exams for early detection of glaucoma and other disorders of the eye. Is the patient up to date with their annual eye exam?  Yes   Dental Screening: Recommended annual dental exams for proper oral hygiene.  Community Resource Referral / Chronic Care Management: CRR required this visit?  No   CCM required this visit?  No      Plan:   Keep all routine maintenance appointments.   CCM 02/07/21 @ 2:15  Follow up 03/04/21 @ 2:00  Follow up 06/11/21 @ 2:00  I have personally reviewed and noted the following in the patient's chart:   . Medical and social history . Use of alcohol, tobacco or illicit drugs  . Current medications and supplements . Functional ability and status . Nutritional status . Physical activity . Advanced directives . List of other physicians . Hospitalizations, surgeries, and ER visits in previous 12 months . Vitals . Screenings to include cognitive, depression, and falls . Referrals and appointments  In addition, I have reviewed and discussed with patient certain preventive protocols, quality metrics, and best practice recommendations. A  written personalized care plan for preventive services as well as general preventive health recommendations were provided to patient via mychart.     Varney Biles, LPN   07/01/7710

## 2021-02-05 NOTE — Telephone Encounter (Signed)
Called pt to let her know that we have received her Ozempic patient assistance medication and it is ready to be picked up. Pt gave a verbal understanding and stated that she would pick it up tomorrow.

## 2021-02-07 ENCOUNTER — Ambulatory Visit (INDEPENDENT_AMBULATORY_CARE_PROVIDER_SITE_OTHER): Payer: Medicare Other | Admitting: Pharmacist

## 2021-02-07 VITALS — Wt 288.0 lb

## 2021-02-07 DIAGNOSIS — E1169 Type 2 diabetes mellitus with other specified complication: Secondary | ICD-10-CM | POA: Diagnosis not present

## 2021-02-07 DIAGNOSIS — E785 Hyperlipidemia, unspecified: Secondary | ICD-10-CM

## 2021-02-07 DIAGNOSIS — I700 Atherosclerosis of aorta: Secondary | ICD-10-CM

## 2021-02-07 DIAGNOSIS — I10 Essential (primary) hypertension: Secondary | ICD-10-CM | POA: Diagnosis not present

## 2021-02-07 DIAGNOSIS — E669 Obesity, unspecified: Secondary | ICD-10-CM

## 2021-02-07 NOTE — Chronic Care Management (AMB) (Signed)
Chronic Care Management Pharmacy Note  02/07/2021 Name:  Katie Huber MRN:  [id number] DOB:  Jul 06, 1951  Subjective: Katie Huber is an 70 y.o. year old female who is a primary patient of Tullo, Aris Everts, MD.  The CCM team was consulted for assistance with disease management and care coordination needs.    Engaged with patient by telephone for follow up visit in response to provider referral for pharmacy case management and/or care coordination services.   Consent to Services:  The patient was given information about Chronic Care Management services, agreed to services, and gave verbal consent prior to initiation of services.  Please see initial visit note for detailed documentation.   Patient Care Team: [name], MD as PCP - General (Internal Medicine) De Hollingshead, RPH-CPP (Pharmacist)  Recent office visits:  02/05/21 - Annual Wellness Visit  Recent consult visits: None  Hospital visits: None in previous 6 months  Objective:  Lab Results  Component Value Date   CREATININE 0.95 11/07/2020   BUN 11 11/07/2020   GFR 61.14 11/07/2020   GFRNONAA 52 (L) 03/19/2020   GFRAA 60 (L) 03/19/2020   NA 135 11/07/2020   K 4.1 11/07/2020   CALCIUM 9.9 11/07/2020   CO2 28 11/07/2020    Lab Results  Component Value Date/Time   HGBA1C 5.5 12/12/2020 02:55 PM   HGBA1C 6.6 (H) 08/29/2020 10:23 AM   HGBA1C 8.4 (H) 01/30/2020 11:14 AM   GFR 61.14 11/07/2020 04:28 PM   GFR 88.72 08/29/2020 11:30 AM   MICROALBUR <0.7 09/03/2020 03:09 PM   MICROALBUR <0.7 01/28/2019 01:15 PM    Last diabetic Eye exam:  Lab Results  Component Value Date/Time   HMDIABEYEEXA No Retinopathy 05/21/2020 12:00 AM    Last diabetic Foot exam:  Lab Results  Component Value Date/Time   HMDIABFOOTEX normal 06/13/2014 12:00 AM     Lab Results  Component Value Date   CHOL 151 08/29/2020   HDL 58.40 08/29/2020   LDLCALC 70 08/29/2020   LDLDIRECT 69.0 06/05/2017   TRIG 115.0 08/29/2020    CHOLHDL 3 08/29/2020    Hepatic Function Latest Ref Rng & Units 08/29/2020 01/30/2020 01/24/2019  Total Protein 6.0 - 8.3 g/dL 6.7 7.0 6.9  Albumin 3.5 - 5.2 g/dL 4.2 3.9 4.1  AST 0 - 37 U/L '21 15 24  ' ALT 0 - 35 U/L 46(H) 14 38(H)  Alk Phosphatase 39 - 117 U/L 87 75 102  Total Bilirubin 0.2 - 1.2 mg/dL 0.6 0.7 0.5  Bilirubin, Direct 0.0 - 0.3 mg/dL - - -    Lab Results  Component Value Date/Time   TSH 1.08 06/08/2014 10:56 AM   TSH 1.05 02/11/2013 09:48 AM    CBC Latest Ref Rng & Units 03/19/2020 01/30/2020 03/09/2017  WBC 4.0 - 10.5 K/uL 7.6 4.5 5.7  Hemoglobin 12.0 - 15.0 g/dL 14.2 13.4 13.0  Hematocrit 36.0 - 46.0 % 42.3 40.1 38.6  Platelets 150 - 400 K/uL 218 217.0 196    Lab Results  Component Value Date/Time   VD25OH 19.07 (L) 01/30/2020 11:14 AM   VD25OH 26.56 (L) 06/23/2016 11:44 AM    Clinical ASCVD: No    Depression screen [facility] 2/9 02/05/2021 02/02/2020 10/25/2019  Decreased Interest 0 0 0  Down, Depressed, Hopeless 0 0 0  PHQ - 2 Score 0 0 0  Altered sleeping - - -  Tired, decreased energy - - -  Change in appetite - - -  Feeling bad or failure about yourself  - - -  Trouble concentrating - - -  Moving slowly or fidgety/restless - - -  Suicidal thoughts - - -  PHQ-9 Score - - -  Difficult doing work/chores - - -     Social History   Tobacco Use  Smoking Status Never Smoker  Smokeless Tobacco Never Used   BP Readings from Last 3 Encounters:  12/12/20 130/72  11/07/20 (!) 174/84  10/16/20 130/80   Pulse Readings from Last 3 Encounters:  12/12/20 81  11/07/20 75  10/16/20 67   Wt Readings from Last 3 Encounters:  02/07/21 288 lb (130.6 kg)  02/05/21 298 lb (135.2 kg)  01/10/21 298 lb (135.2 kg)    Assessment/Interventions: Review of patient past medical history, allergies, medications, health status, including review of consultants reports, laboratory and other test data, was performed as part of comprehensive evaluation and provision of chronic  care management services.   SDOH:  (Social Determinants of Health) assessments and interventions performed: Yes SDOH Interventions   Flowsheet Row Most Recent Value  SDOH Interventions   Financial Strain Interventions Other (Comment)  [manufacturer assistance]      CCM Care Plan  No Known Allergies  Medications Reviewed Today    Reviewed by Dia Crawford, LPN (Licensed Practical Nurse) on 02/05/21 at 1232  Med List Status: <None>  Medication Order Taking? Sig Documenting Provider Last Dose Status Informant  acyclovir (ZOVIRAX) 400 MG tablet [id number] No TAKE ONE TABLET BY MOUTH  FIVE TIMES DAILY  Patient taking differently: Take 400 mg by mouth as needed. TAKE ONE TABLET BY MOUTH FIVE TIMES DAILY   [name], MD Taking Active   albuterol (PROVENTIL HFA;VENTOLIN HFA) 108 (90 Base) MCG/ACT inhaler [id number] No Inhale 2 puffs into the lungs every 6 (six) hours as needed for wheezing or shortness of breath. Kendell Bane, NP Taking Active   albuterol (PROVENTIL) (2.5 MG/3ML) 0.083% nebulizer solution [id number] No Take 3 mLs (2.5 mg total) by nebulization every 6 (six) hours as needed for wheezing or shortness of breath. Kendell Bane, NP Taking Active   amLODipine (NORVASC) 10 MG tablet [id number] No Take 1 tablet (10 mg total) by mouth daily. [name], MD Taking Active   atorvastatin (LIPITOR) 20 MG tablet [id number] No TAKE ONE TABLET EVERY DAY [name], MD Taking Active   furosemide (LASIX) 20 MG tablet [id number] No TAKE ONE TABLET EVERY DAY AS NEEDED FOR FLUID RETENTION [name], MD Taking Active   gabapentin (NEURONTIN) 300 MG capsule [id number] No TAKE 2 CAPSULES 3 TIMES DAILY [name], MD Taking Active   glucose blood test strip [id number] No OneTouch Verio Fex.    Test one time daily for steroid induced diabetes [name], MD Taking Active   insulin degludec (TRESIBA FLEXTOUCH) 200 UNIT/ML FlexTouch Pen [id number]  Inject 8 Units  into the skin daily. [name], MD  Active   Insulin Pen Needle (PEN NEEDLES) 32G X 6 MM MISC [id number] No 1 application by Does not apply route daily. Use to inject insulin once daily. [name], MD Taking Active   losartan (COZAAR) 100 MG tablet [id number] No TAKE ONE TABLET EVERY DAY [name], MD Taking Active   metFORMIN (GLUCOPHAGE-XR) 500 MG 24 hr tablet [id number] No Take 2 tablets (1,000 mg total) by mouth daily. [name], MD Taking Active   montelukast (SINGULAIR) 10 MG tablet [id number] No TAKE 1 TABLET BY MOUTH AT  BEDTIME [name], MD Taking Active  omeprazole (PRILOSEC) 20 MG capsule [id number] No TAKE 1 CAPSULE BY MOUTH  TWICE DAILY [name], MD Taking Active   PARoxetine (PAXIL) 20 MG tablet [id number]  TAKE ONE TABLET EVERY MORNING [name], MD  Active   pneumococcal 13-valent conjugate vaccine (PREVNAR 13) injection 0.5 mL [id number]   [name], MD  Active   potassium chloride SA (KLOR-CON) 20 MEQ tablet [id number] No TAKE 1 TABLET BY MOUTH  DAILY WHEN USING FUROSEMIDE [name], MD Taking Active   promethazine (PHENERGAN) 25 MG tablet [id number] No TAKE 1 TABLET BY MOUTH  EVERY 8 HOURS AS NEEDED FOR NAUSEA AND VOMITING [name], MD Taking Active   Semaglutide, 1 MG/DOSE, (OZEMPIC, 1 MG/DOSE,) 4 MG/3ML SOPN [id number]  Inject 1 mg into the skin once a week. [name], MD  Active   triamterene-hydrochlorothiazide [facility]) 37.5-25 MG tablet [id number]  TAKE ONE TABLET BY MOUTH EVERY DAY [name], MD  Active   VITAMIN D, CHOLECALCIFEROL, PO [id number] No Take 1 tablet by mouth daily. [provider] Taking Active Self  zolpidem (AMBIEN) 10 MG tablet [id number] No Take 1 tablet (10 mg total) by mouth at bedtime as needed. for sleep [name], NP Taking Active           Patient Active Problem List   Diagnosis Date Noted  . Coronary artery calcification of native artery 11/10/2020  . Abdominal  aortic atherosclerosis ([place]) 11/10/2020  . Oral cavity pain 01/09/2020  . Vitamin D deficiency 01/09/2020  . Diarrhea, functional 07/26/2018  . Tubular adenoma of colon 04/01/2018  . Benign neoplasm of cecum   . Encounter for screening colonoscopy   . Rectal polyp   . B12 deficiency 01/26/2018  . Chronic respiratory failure with hypoxia ([place]) 12/11/2017  . Hospital discharge follow-up 03/14/2017  . Asthma 03/04/2017  . Herpes simplex infection 08/16/2015  . Skin neoplasm 08/16/2015  . Pulmonary hypertension ([place]) 02/06/2015  . S/P vaginal hysterectomy 06/13/2014  . Encounter for preventive health examination 06/13/2014  . Essential hypertension, benign 12/27/2013  . S/P bariatric surgery 09/20/2013  . 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 Type 2 diabetes mellitus with obesity ([place]) 03/02/2013  . Hyperlipidemia associated with type 2 diabetes mellitus ([place]) 12/17/2011  . Encounter for long-term (current) use of other medications 12/17/2011  . Cervicalgia 12/16/2011  . Sleep apnea 12/16/2011  . Obesity, Class III, BMI 40-49.9 (morbid obesity) ([place])     Immunization History  Administered Date(s) Administered  . Fluad Quad(high Dose 65+) 08/31/2019, 09/03/2020  . Influenza, High Dose Seasonal PF 09/09/2017, 02/09/2019  . PFIZER(Purple Top)SARS-COV-2 Vaccination 01/26/2020, 02/16/2020, 10/11/2020  . Pneumococcal Conjugate-13 02/06/2015  . Pneumococcal Polysaccharide-23 09/20/2010, 09/09/2017  . Tdap 09/20/2013  . Zoster 06/13/2014  . Zoster Recombinat (Shingrix) 12/12/2020    Conditions to be addressed/monitored:  Hypertension, Hyperlipidemia and Diabetes  Care Plan : Medication Management  Updates made by De Hollingshead, RPH-CPP since 02/07/2021 12:00 AM    Problem: Diabetes, Weight Management, HTN     Long-Range Goal: Disease Progression Prevented or Minimized   Start Date: 10/10/2020  Recent Progress: On track  Priority: High  Note:   Current Barriers:   . Unable to independently afford treatment regimen . Unable to independently monitor therapeutic efficacy . Unable to achieve control of diabetes   Pharmacist Clinical Goal(s):  [name] Over the next 90 days, patient will verbalize ability to afford treatment regimen. . Over the next 90 days, achieve  adherence to monitoring guidelines and medication adherence to achieve therapeutic efficacy. . Over the next 90 days, achieve control of diabetes as evidenced by improvement in A1c through collaboration with PharmD and provider.   Interventions: . 1:1 collaboration with [name], MD regarding development and update of comprehensive plan of care as evidenced by provider attestation and co-signature . Inter-disciplinary care team collaboration (see longitudinal plan of care) . Comprehensive medication review performed; medication list updated in electronic medical record  Diabetes: . CONTROLLED; current treatment: Ozempic 1 mg weekly; Tresiba 8 units daily, metformin XR 1000 mg daily  . Most recent home weight: 288 lbs; baseline weight: 332 lbs  . Approved for Ozempic assistance through [employer] through 10/30/21. . Current glucose readings:  Date of Download: 02/07/21 Average Glucose: 99 mg/dL Time in Goal:  - Time in range 70-180: 100% - Time above range: 0% - Time below range: 0% . Discontinue Tyler Aas.  . Continue Ozempic 1 mg, metformin XR 1000 mg. Congratulated patient on continued weight loss success . Continue focus on reducing carbohydrate content in meals. Continue to stay active.   Hypertension: . Controlled per last visit; current treatment: losartan 100 mg QAM, triamterene/HCTZ 37.5/25 mg QAM, amlodipine 10 mg QPM . Encouraged home BP monitoring, especially if continued weight loss . Continue current regimen at this time.   Hyperlipidemia: . Controlled per last lipid panel; current treatment: atorvastatin 20 mg daily  . Recommended to continue current regimen  at this  time.  Asthma: . Controlled; current treatment: montelukast 10 mg daily, [place]ixela PRN, albuterol HFA PRN 1-2 times monthly. Nebulizer only required when she has respiratory infection . Recommended to continue current regimen  given lack of symptoms  Cervicalgia: . Appropriately managed; current treatment: gabapentin 600 mg TID . Recommend to continue current regimen at this time. Monitor for sedation.   Patient Goals/Self-Care Activities . Over the next 90 days, patient will:  - take medications as prescribed check blood glucose three times daily using CGM, document, and provide at future appointments collaborate with provider on medication access solutions  Follow Up Plan: Telephone follow up appointment with care management team member scheduled for: ~8 weeks (recommended 12 weeks but patient requested sooner follow up)       Medication Assistance: OZempic obtained through [employer] medication assistance program.  Enrollment ends 10/30/21  Patient's preferred pharmacy is:  [place], [place] - [address] [place] [address] Phone: [phone] Fax: [phone]   Care Plan and Follow Up Patient Decision:  Patient agrees to Care Plan and Follow-up.  Plan: Telephone follow up appointment with care management team member scheduled for:  ~ 8 weeks  [name], PharmD, [place], [place] Clinical Pharmacist [employer] [facility] [employer] [phone]

## 2021-02-07 NOTE — Patient Instructions (Signed)
Visit Information  PATIENT GOALS: Goals Addressed              This Visit's Progress     Patient Stated   .  Monitor and Manage My Blood Sugar (pt-stated)        Patient Goals/Self-Care Activities . Over the next 90 days, patient will:  - take medications as prescribed check blood glucose three times daily using CGM, document, and provide at future appointments collaborate with provider on medication access solutions        Patient verbalizes understanding of instructions provided today and agrees to view in [place].   Plan: Telephone follow up appointment with care management team member scheduled for:  ~ 8 weeks  [name], PharmD, [place], [place] Clinical Pharmacist [employer] [facility] [employer] [phone]

## 2021-02-20 DIAGNOSIS — E1165 Type 2 diabetes mellitus with hyperglycemia: Secondary | ICD-10-CM | POA: Diagnosis not present

## 2021-03-04 ENCOUNTER — Ambulatory Visit (INDEPENDENT_AMBULATORY_CARE_PROVIDER_SITE_OTHER): Payer: Medicare Other | Admitting: Internal Medicine

## 2021-03-04 ENCOUNTER — Other Ambulatory Visit: Payer: Self-pay | Admitting: Internal Medicine

## 2021-03-04 ENCOUNTER — Other Ambulatory Visit: Payer: Self-pay

## 2021-03-04 ENCOUNTER — Encounter: Payer: Self-pay | Admitting: Internal Medicine

## 2021-03-04 VITALS — BP 134/68 | HR 65 | Temp 97.8°F | Resp 16 | Ht 67.0 in | Wt 278.4 lb

## 2021-03-04 DIAGNOSIS — I10 Essential (primary) hypertension: Secondary | ICD-10-CM

## 2021-03-04 DIAGNOSIS — B351 Tinea unguium: Secondary | ICD-10-CM

## 2021-03-04 DIAGNOSIS — G4700 Insomnia, unspecified: Secondary | ICD-10-CM | POA: Diagnosis not present

## 2021-03-04 DIAGNOSIS — E1169 Type 2 diabetes mellitus with other specified complication: Secondary | ICD-10-CM

## 2021-03-04 DIAGNOSIS — I700 Atherosclerosis of aorta: Secondary | ICD-10-CM

## 2021-03-04 DIAGNOSIS — J4520 Mild intermittent asthma, uncomplicated: Secondary | ICD-10-CM | POA: Diagnosis not present

## 2021-03-04 DIAGNOSIS — E785 Hyperlipidemia, unspecified: Secondary | ICD-10-CM | POA: Diagnosis not present

## 2021-03-04 MED ORDER — MONTELUKAST SODIUM 10 MG PO TABS: 1.0000 | ORAL_TABLET | Freq: Every day | ORAL | 3 refills | Status: DC

## 2021-03-04 MED ORDER — ZOLPIDEM TARTRATE 10 MG PO TABS: 10.0000 mg | ORAL_TABLET | Freq: Every evening | ORAL | 1 refills | Status: DC | PRN

## 2021-03-04 NOTE — Patient Instructions (Signed)
Try treating your toenail fungus naturally with vinegar or tea tree oil dabbed on the top of the nail daily after a few strokes with a disposable emery board

## 2021-03-04 NOTE — Progress Notes (Signed)
Subjective:  Patient ID: Katie Huber, female    DOB: Oct 17, 1951  Age: 70 y.o. MRN: [id number]  CC: The primary encounter diagnosis was Hyperlipidemia associated with type 2 diabetes mellitus ([place]). Diagnoses of Onychomycosis of right great toe, Insomnia, unspecified type, Abdominal aortic atherosclerosis ([place]), Mild intermittent asthma, unspecified whether complicated, Obesity, Class III, BMI 40-49.9 (morbid obesity) ([place]), and Essential hypertension, benign were also pertinent to this visit.  HPI Katie Huber presents for 6 month follow up on typ 2 DM with obesity and hypertension  This visit occurred during the SARS-CoV-2 public health emergency.  Safety protocols were in place, including screening questions prior to the visit, additional usage of staff PPE, and extensive cleaning of exam room while observing appropriate contact time as indicated for disinfecting solutions.   Type 2 DM:  Using the FreeStyle monitor to check sugars frequently.  All have been in range with current therapy which includes Ozempic. Metformin.  No longer using insulin since last year.  Losing weight on ozempic .   52 lbs in the last year.  Walking farther , without support .  Lab Results  Component Value Date   HGBA1C 5.5 12/12/2020   Toenail fungus  Right great toe.  Has tried some OTC topical antifungal without change.       Outpatient Medications Prior to Visit  Medication Sig Dispense Refill  . acyclovir (ZOVIRAX) 400 MG tablet TAKE ONE TABLET BY MOUTH  FIVE TIMES DAILY (Patient taking differently: Take 400 mg by mouth as needed. TAKE ONE TABLET BY MOUTH FIVE TIMES DAILY) 450 tablet 1  . albuterol (PROVENTIL HFA;VENTOLIN HFA) 108 (90 Base) MCG/ACT inhaler Inhale 2 puffs into the lungs every 6 (six) hours as needed for wheezing or shortness of breath. 3 Inhaler 1  . albuterol (PROVENTIL) (2.5 MG/3ML) 0.083% nebulizer solution Take 3 mLs (2.5 mg total) by nebulization every 6 (six) hours as needed for  wheezing or shortness of breath. 150 mL 1  . amLODipine (NORVASC) 10 MG tablet Take 1 tablet (10 mg total) by mouth daily. 90 tablet 1  . atorvastatin (LIPITOR) 20 MG tablet TAKE ONE TABLET EVERY DAY 90 tablet 1  . furosemide (LASIX) 20 MG tablet TAKE ONE TABLET EVERY DAY AS NEEDED FOR FLUID RETENTION 90 tablet 1  . gabapentin (NEURONTIN) 300 MG capsule TAKE 2 CAPSULES 3 TIMES DAILY 540 capsule 3  . glucose blood test strip OneTouch Verio Fex.    Test one time daily for steroid induced diabetes 100 each 0  . Insulin Pen Needle (PEN NEEDLES) 32G X 6 MM MISC 1 application by Does not apply route daily. Use to inject insulin once daily. 100 each 3  . losartan (COZAAR) 100 MG tablet TAKE ONE TABLET EVERY DAY 90 tablet 1  . metFORMIN (GLUCOPHAGE-XR) 500 MG 24 hr tablet Take 2 tablets (1,000 mg total) by mouth daily. 180 tablet 3  . omeprazole (PRILOSEC) 20 MG capsule TAKE 1 CAPSULE TWICE DAILY 180 capsule 3  . PARoxetine (PAXIL) 20 MG tablet TAKE ONE TABLET EVERY MORNING 90 tablet 3  . potassium chloride SA (KLOR-CON) 20 MEQ tablet TAKE 1 TABLET BY MOUTH  DAILY WHEN USING FUROSEMIDE 90 tablet 1  . promethazine (PHENERGAN) 25 MG tablet TAKE 1 TABLET BY MOUTH  EVERY 8 HOURS AS NEEDED FOR NAUSEA AND VOMITING 90 tablet 0  . Semaglutide, 1 MG/DOSE, (OZEMPIC, 1 MG/DOSE,) 4 MG/3ML SOPN Inject 1 mg into the skin once a week. 3 mL 0  . triamterene-hydrochlorothiazide (MAXZIDE-25) 37.5-25  MG tablet TAKE ONE TABLET BY MOUTH EVERY DAY 90 tablet 0  . VITAMIN D, CHOLECALCIFEROL, PO Take 1 tablet by mouth daily.    . montelukast (SINGULAIR) 10 MG tablet TAKE 1 TABLET BY MOUTH AT  BEDTIME 90 tablet 3  . zolpidem (AMBIEN) 10 MG tablet Take 1 tablet (10 mg total) by mouth at bedtime as needed. for sleep 90 tablet 0   Facility-Administered Medications Prior to Visit  Medication Dose Route Frequency Provider Last Rate Last Admin  . pneumococcal 13-valent conjugate vaccine (PREVNAR 13) injection 0.5 mL  0.5 mL  Intramuscular Once [name], MD        Review of Systems;  Patient denies headache, fevers, malaise, unintentional weight loss, skin rash, eye pain, sinus congestion and sinus pain, sore throat, dysphagia,  hemoptysis , cough, dyspnea, wheezing, chest pain, palpitations, orthopnea, edema, abdominal pain, nausea, melena, diarrhea, constipation, flank pain, dysuria, hematuria, urinary  Frequency, nocturia, numbness, tingling, seizures,  Focal weakness, Loss of consciousness,  Tremor, insomnia, depression, anxiety, and suicidal ideation.      Objective:  BP 134/68 (BP Location: Left Arm, Patient Position: Sitting, Cuff Size: Large)   Pulse 65   Temp 97.8 F (36.6 C) (Oral)   Resp 16   Ht 5\' 7"  (1.702 m)   Wt 278 lb 6.4 oz (126.3 kg)   SpO2 97%   BMI 43.60 kg/m   BP Readings from Last 3 Encounters:  03/04/21 134/68  12/12/20 130/72  11/07/20 (!) 174/84    Wt Readings from Last 3 Encounters:  03/04/21 278 lb 6.4 oz (126.3 kg)  02/07/21 288 lb (130.6 kg)  02/05/21 298 lb (135.2 kg)    General appearance: alert, cooperative and appears stated age Ears: normal TM's and external ear canals both ears Throat: lips, mucosa, and tongue normal; teeth and gums normal Neck: no adenopathy, no carotid bruit, supple, symmetrical, trachea midline and thyroid not enlarged, symmetric, no tenderness/mass/nodules Back: symmetric, no curvature. ROM normal. No CVA tenderness. Lungs: clear to auscultation bilaterally Heart: regular rate and rhythm, S1, S2 normal, no murmur, click, rub or gallop Abdomen: soft, non-tender; bowel sounds normal; no masses,  no organomegaly Pulses: 2+ and symmetric Skin: Skin color, texture, turgor normal. No rashes or lesions Lymph nodes: Cervical, supraclavicular, and axillary nodes normal.  Lab Results  Component Value Date   HGBA1C 5.5 12/12/2020   HGBA1C 6.6 (H) 08/29/2020   HGBA1C 8.4 (H) 01/30/2020    Lab Results  Component Value Date   CREATININE  0.95 11/07/2020   CREATININE 0.66 08/29/2020   CREATININE 1.10 (H) 03/19/2020    Lab Results  Component Value Date   WBC 7.6 03/19/2020   HGB 14.2 03/19/2020   HCT 42.3 03/19/2020   PLT 218 03/19/2020   GLUCOSE 100 (H) 11/07/2020   CHOL 151 08/29/2020   TRIG 115.0 08/29/2020   HDL 58.40 08/29/2020   LDLDIRECT 69.0 06/05/2017   LDLCALC 70 08/29/2020   ALT 46 (H) 08/29/2020   AST 21 08/29/2020   NA 135 11/07/2020   K 4.1 11/07/2020   CL 99 11/07/2020   CREATININE 0.95 11/07/2020   BUN 11 11/07/2020   CO2 28 11/07/2020   TSH 1.08 06/08/2014   HGBA1C 5.5 12/12/2020   MICROALBUR <0.7 09/03/2020    MM 3D SCREEN BREAST BILATERAL  Result Date: 01/10/2021 CLINICAL DATA:  Screening. EXAM: DIGITAL SCREENING BILATERAL MAMMOGRAM WITH TOMOSYNTHESIS AND CAD TECHNIQUE: Bilateral screening digital craniocaudal and mediolateral oblique mammograms were obtained. Bilateral screening digital breast  tomosynthesis was performed. The images were evaluated with computer-aided detection. COMPARISON:  Previous exam(s). ACR Breast Density Category b: There are scattered areas of fibroglandular density. FINDINGS: There are no findings suspicious for malignancy. IMPRESSION: No mammographic evidence of malignancy. A result letter of this screening mammogram will be mailed directly to the patient. RECOMMENDATION: Screening mammogram in one year. (Code:SM-B-01Y) BI-RADS CATEGORY  1: Negative. Electronically Signed   By: Fidela Salisbury M.D.   On: 01/10/2021 16:53    Assessment & Plan:   Problem List Items Addressed This Visit      Unprioritized   Abdominal aortic atherosclerosis ([place])    She remains asymptomatic .  Reviewed findings of prior CT scan today..  Patient is  Tolerating tolerating atorvastatin  20 mg  daily for primary prevention   Lab Results  Component Value Date   CHOL 151 08/29/2020   HDL 58.40 08/29/2020   LDLCALC 70 08/29/2020   LDLDIRECT 69.0 06/05/2017   TRIG 115.0 08/29/2020    CHOLHDL 3 08/29/2020         Essential hypertension, benign    Well controlled on current regimen of losartan, amlodipine and maxzide.. Renal function stable, no changes today.      Hyperlipidemia associated with type 2 diabetes mellitus ([place]) - Primary   Relevant Orders   Hemoglobin A1c   Lipid panel   Comprehensive metabolic panel   Microalbumin / creatinine urine ratio   Mild intermittent asthma    Reviewed use of albuterol MDI .  She averages < 3/month use since COVID epidemic.  She is vaccinated .[name]  Continue Singulair       Relevant Medications   montelukast (SINGULAIR) 10 MG tablet   Obesity, Class III, BMI 40-49.9 (morbid obesity) ([place])    I have congratulated her in reduction of   BMI and encouraged  Continued weight loss with Ozempic.  She has lost  20% of body weight, encouraged to continue efforts  over the next 6 months using a low glycemic index diet and has increased her activity to include regular exercise a minimum of 5 days per week.  2      Onychomycosis of right great toe    .  Try treating your toenail fungus naturally with vinegar or tea tree oil dabbed on the top of the nail each day  after giving the nail a few strokes with a disposable emery board .  Anything I prescribe would necessitate stopping your cholesterol medication.          Other Visit Diagnoses    Insomnia, unspecified type       Relevant Medications   zolpidem (AMBIEN) 10 MG tablet     I spent 30 mintutes dedicated to the care of this patient on the date of this encounter to include pre-visit review of his medical history,  Face-to-face time with the patient , and post visit ordering of testing and therapeutics.  I have changed Katie Huber's montelukast. I am also having her maintain her glucose blood, albuterol, albuterol, acyclovir, (VITAMIN D, CHOLECALCIFEROL, PO), Pen Needles, atorvastatin, furosemide, promethazine, potassium chloride SA, gabapentin, metFORMIN, amLODipine,  losartan, Ozempic (1 MG/DOSE), triamterene-hydrochlorothiazide, PARoxetine, omeprazole, and zolpidem. We will continue to administer pneumococcal 13-valent conjugate vaccine.  Meds ordered this encounter  Medications  . montelukast (SINGULAIR) 10 MG tablet    Sig: Take 1 tablet (10 mg total) by mouth at bedtime.    Dispense:  90 tablet    Refill:  3    Requesting 1  year supply  . zolpidem (AMBIEN) 10 MG tablet    Sig: Take 1 tablet (10 mg total) by mouth at bedtime as needed. for sleep    Dispense:  90 tablet    Refill:  1    Medications Discontinued During This Encounter  Medication Reason  . montelukast (SINGULAIR) 10 MG tablet Reorder  . zolpidem (AMBIEN) 10 MG tablet Reorder    Follow-up: Return for follow up diabetes.   [name], MD

## 2021-03-04 NOTE — Assessment & Plan Note (Addendum)
.    Try treating your toenail fungus naturally with vinegar or tea tree oil dabbed on the top of the nail each day  after giving the nail a few strokes with a disposable emery board .  Anything I prescribe would necessitate stopping your cholesterol medication.

## 2021-03-05 NOTE — Assessment & Plan Note (Signed)
I have congratulated her in reduction of   BMI and encouraged  Continued weight loss with Ozempic.  She has lost  20% of body weight, encouraged to continue efforts  over the next 6 months using a low glycemic index diet and has increased her activity to include regular exercise a minimum of 5 days per week.  2

## 2021-03-05 NOTE — Assessment & Plan Note (Addendum)
Reviewed use of albuterol MDI .  She averages < 3/month use since COVID epidemic.  She is vaccinated .[name]  Continue Singulair

## 2021-03-05 NOTE — Assessment & Plan Note (Signed)
Well controlled on current regimen of losartan, amlodipine and maxzide.. Renal function stable, no changes today.

## 2021-03-05 NOTE — Assessment & Plan Note (Addendum)
She remains asymptomatic .  Reviewed findings of prior CT scan today..  Patient is  Tolerating tolerating atorvastatin  20 mg  daily for primary prevention   Lab Results  Component Value Date   CHOL 151 08/29/2020   HDL 58.40 08/29/2020   LDLCALC 70 08/29/2020   LDLDIRECT 69.0 06/05/2017   TRIG 115.0 08/29/2020   CHOLHDL 3 08/29/2020

## 2021-03-15 DIAGNOSIS — E1165 Type 2 diabetes mellitus with hyperglycemia: Secondary | ICD-10-CM | POA: Diagnosis not present

## 2021-04-01 ENCOUNTER — Other Ambulatory Visit: Payer: Self-pay | Admitting: Internal Medicine

## 2021-04-06 ENCOUNTER — Other Ambulatory Visit: Payer: Self-pay | Admitting: Internal Medicine

## 2021-04-11 ENCOUNTER — Ambulatory Visit (INDEPENDENT_AMBULATORY_CARE_PROVIDER_SITE_OTHER): Payer: Medicare Other | Admitting: Pharmacist

## 2021-04-11 DIAGNOSIS — E669 Obesity, unspecified: Secondary | ICD-10-CM

## 2021-04-11 DIAGNOSIS — E1169 Type 2 diabetes mellitus with other specified complication: Secondary | ICD-10-CM

## 2021-04-11 DIAGNOSIS — Z9884 Bariatric surgery status: Secondary | ICD-10-CM

## 2021-04-11 DIAGNOSIS — E785 Hyperlipidemia, unspecified: Secondary | ICD-10-CM

## 2021-04-11 DIAGNOSIS — I10 Essential (primary) hypertension: Secondary | ICD-10-CM

## 2021-04-11 DIAGNOSIS — M542 Cervicalgia: Secondary | ICD-10-CM

## 2021-04-11 NOTE — Patient Instructions (Signed)
Visit Information  PATIENT GOALS: Goals Addressed              This Visit's Progress     Patient Stated   .  Monitor and Manage My Blood Sugar (pt-stated)        Patient Goals/Self-Care Activities . Over the next 90 days, patient will:  - take medications as prescribed check blood glucose three times daily using CGM, document, and provide at future appointments collaborate with provider on medication access solutions        Patient verbalizes understanding of instructions provided today and agrees to view in [place].   Plan: Telephone follow up appointment with care management team member scheduled for:  ~12 weeks  [name], PharmD, [place], [place] Clinical Pharmacist [employer] [facility] [employer] [phone]

## 2021-04-11 NOTE — Chronic Care Management (AMB) (Signed)
Chronic Care Management Pharmacy Note  04/11/2021 Name:  Katie Huber MRN:  [id number] DOB:  08-11-1951  Subjective: Katie Huber is an 70 y.o. year old female who is a primary patient of Tullo, Aris Everts, MD.  The CCM team was consulted for assistance with disease management and care coordination needs.    Engaged with patient by telephone for follow up visit in response to provider referral for pharmacy case management and/or care coordination services.   Consent to Services:  The patient was given information about Chronic Care Management services, agreed to services, and gave verbal consent prior to initiation of services.  Please see initial visit note for detailed documentation.   Patient Care Team: [name], MD as PCP - General (Internal Medicine) De Hollingshead, RPH-CPP (Pharmacist)  Recent office visits: 4/4- PCP visit. A1c very well controlled. Continue current regimen at this time  Recent consult visits: None recently  Hospital visits: None in previous 6 months  Objective:  Lab Results  Component Value Date   CREATININE 0.95 11/07/2020   CREATININE 0.66 08/29/2020   CREATININE 1.10 (H) 03/19/2020    Lab Results  Component Value Date   HGBA1C 5.5 12/12/2020   Last diabetic Eye exam:  Lab Results  Component Value Date/Time   HMDIABEYEEXA No Retinopathy 05/21/2020 12:00 AM    Last diabetic Foot exam:  Lab Results  Component Value Date/Time   HMDIABFOOTEX normal 06/13/2014 12:00 AM        Component Value Date/Time   CHOL 151 08/29/2020 1130   TRIG 115.0 08/29/2020 1130   HDL 58.40 08/29/2020 1130   CHOLHDL 3 08/29/2020 1130   VLDL 23.0 08/29/2020 1130   LDLCALC 70 08/29/2020 1130   LDLDIRECT 69.0 06/05/2017 0942    Hepatic Function Latest Ref Rng & Units 08/29/2020 01/30/2020 01/24/2019  Total Protein 6.0 - 8.3 g/dL 6.7 7.0 6.9  Albumin 3.5 - 5.2 g/dL 4.2 3.9 4.1  AST 0 - 37 U/L '21 15 24  ' ALT 0 - 35 U/L 46(H) 14 38(H)  Alk  Phosphatase 39 - 117 U/L 87 75 102  Total Bilirubin 0.2 - 1.2 mg/dL 0.6 0.7 0.5  Bilirubin, Direct 0.0 - 0.3 mg/dL - - -    Lab Results  Component Value Date/Time   TSH 1.08 06/08/2014 10:56 AM   TSH 1.05 02/11/2013 09:48 AM    CBC Latest Ref Rng & Units 03/19/2020 01/30/2020 03/09/2017  WBC 4.0 - 10.5 K/uL 7.6 4.5 5.7  Hemoglobin 12.0 - 15.0 g/dL 14.2 13.4 13.0  Hematocrit 36.0 - 46.0 % 42.3 40.1 38.6  Platelets 150 - 400 K/uL 218 217.0 196    Lab Results  Component Value Date/Time   VD25OH 19.07 (L) 01/30/2020 11:14 AM   VD25OH 26.56 (L) 06/23/2016 11:44 AM    Clinical ASCVD: No  The 10-year ASCVD risk score Mikey Bussing DC Jr., et al., 2013) is: 20.4%   Values used to calculate the score:     Age: 59 years     Sex: Female     Is Non-Hispanic African American: No     Diabetic: Yes     Tobacco smoker: No     Systolic Blood Pressure: 333 mmHg     Is BP treated: Yes     HDL Cholesterol: 58.4 mg/dL     Total Cholesterol: 151 mg/dL     Social History   Tobacco Use  Smoking Status Never Smoker  Smokeless Tobacco Never Used   BP Readings from Last  3 Encounters:  03/04/21 134/68  12/12/20 130/72  11/07/20 (!) 174/84   Pulse Readings from Last 3 Encounters:  03/04/21 65  12/12/20 81  11/07/20 75   Wt Readings from Last 3 Encounters:  03/04/21 278 lb 6.4 oz (126.3 kg)  02/07/21 288 lb (130.6 kg)  02/05/21 298 lb (135.2 kg)    Assessment: Review of patient past medical history, allergies, medications, health status, including review of consultants reports, laboratory and other test data, was performed as part of comprehensive evaluation and provision of chronic care management services.   SDOH:  (Social Determinants of Health) assessments and interventions performed:  SDOH Interventions   Flowsheet Row Most Recent Value  SDOH Interventions   SDOH Interventions for the Following Domains Financial Strain  Financial Strain Interventions Other (Comment)  [manufacturer  assistance]      CCM Care Plan  No Known Allergies  Medications Reviewed Today    Reviewed by De Hollingshead, RPH-CPP (Pharmacist) on 04/11/21 at 1437  Med List Status: <None>  Medication Order Taking? Sig Documenting Provider Last Dose Status Informant  acyclovir (ZOVIRAX) 400 MG tablet [id number] Yes TAKE ONE TABLET BY MOUTH  FIVE TIMES DAILY  Patient taking differently: Take 400 mg by mouth as needed. TAKE ONE TABLET BY MOUTH FIVE TIMES DAILY   [name], MD Taking Active   albuterol (PROVENTIL HFA;VENTOLIN HFA) 108 (90 Base) MCG/ACT inhaler [id number] Yes Inhale 2 puffs into the lungs every 6 (six) hours as needed for wheezing or shortness of breath. Kendell Bane, NP Taking Active   albuterol (PROVENTIL) (2.5 MG/3ML) 0.083% nebulizer solution [id number] Yes Take 3 mLs (2.5 mg total) by nebulization every 6 (six) hours as needed for wheezing or shortness of breath. Kendell Bane, NP Taking Active   amLODipine (NORVASC) 10 MG tablet [id number] Yes Take 1 tablet (10 mg total) by mouth daily. [name], MD Taking Active   atorvastatin (LIPITOR) 20 MG tablet [id number] Yes TAKE ONE TABLET EVERY DAY [name], MD Taking Active   furosemide (LASIX) 20 MG tablet [id number] Yes TAKE ONE TABLET EVERY DAY AS NEEDED FOR FLUID RETENTION [name], MD Taking Active   gabapentin (NEURONTIN) 300 MG capsule [id number] Yes TAKE 2 CAPSULES 3 TIMES DAILY [name], MD Taking Active   glucose blood test strip [id number]  OneTouch Verio Fex.    Test one time daily for steroid induced diabetes [name], MD  Active   Insulin Pen Needle (PEN NEEDLES) 32G X 6 MM MISC [id number] Yes 1 application by Does not apply route daily. Use to inject insulin once daily. [name], MD Taking Active   losartan (COZAAR) 100 MG tablet [id number] Yes TAKE ONE TABLET EVERY DAY [name], MD Taking Active   metFORMIN (GLUCOPHAGE-XR) 500 MG 24 hr tablet [id number] Yes Take 2 tablets  (1,000 mg total) by mouth daily. [name], MD Taking Active   montelukast (SINGULAIR) 10 MG tablet [id number] Yes Take 1 tablet (10 mg total) by mouth at bedtime. [name], MD Taking Active   omeprazole (PRILOSEC) 20 MG capsule [id number] Yes TAKE 1 CAPSULE TWICE DAILY [name], MD Taking Active   PARoxetine (PAXIL) 20 MG tablet [id number] Yes TAKE ONE TABLET EVERY MORNING [name], MD Taking Active   pneumococcal 13-valent conjugate vaccine (PREVNAR 13) injection 0.5 mL [id number]   [name], MD  Active   potassium chloride SA (KLOR-CON) 20 MEQ tablet [id number] Yes TAKE  1 TABLET BY MOUTH  DAILY WHEN USING FUROSEMIDE [name], MD Taking Active   promethazine (PHENERGAN) 25 MG tablet [id number] Yes TAKE 1 TABLET BY MOUTH  EVERY 8 HOURS AS NEEDED FOR NAUSEA AND VOMITING [name], MD Taking Active   Semaglutide, 1 MG/DOSE, (OZEMPIC, 1 MG/DOSE,) 4 MG/3ML SOPN [id number] Yes Inject 1 mg into the skin once a week. [name], MD Taking Active   triamterene-hydrochlorothiazide [facility]) 37.5-25 MG tablet [id number] Yes TAKE ONE TABLET BY MOUTH EVERY DAY [name], MD Taking Active   VITAMIN D, CHOLECALCIFEROL, PO [id number]  Take 1 tablet by mouth daily. [provider]  Active Self  zolpidem (AMBIEN) 10 MG tablet [id number] Yes Take 1 tablet (10 mg total) by mouth at bedtime as needed. for sleep [name], MD Taking Active           Patient Active Problem List   Diagnosis Date Noted  . Onychomycosis of right great toe 03/04/2021  . Coronary artery calcification of native artery 11/10/2020  . Abdominal aortic atherosclerosis ([place]) 11/10/2020  . Oral cavity pain 01/09/2020  . Vitamin D deficiency 01/09/2020  . Diarrhea, functional 07/26/2018  . Tubular adenoma of colon 04/01/2018  . Benign neoplasm of cecum   . Encounter for screening colonoscopy   . Rectal polyp   . B12 deficiency 01/26/2018  . Chronic respiratory failure with  hypoxia ([place]) 12/11/2017  . Hospital discharge follow-up 03/14/2017  . Mild intermittent asthma 03/04/2017  . Herpes simplex infection 08/16/2015  . Skin neoplasm 08/16/2015  . Pulmonary hypertension ([place]) 02/06/2015  . S/P vaginal hysterectomy 06/13/2014  . Encounter for preventive health examination 06/13/2014  . Essential hypertension, benign 12/27/2013  . S/P bariatric surgery 09/20/2013  . 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 Type 2 diabetes mellitus with obesity ([place]) 03/02/2013  . Hyperlipidemia associated with type 2 diabetes mellitus ([place]) 12/17/2011  . Encounter for long-term (current) use of other medications 12/17/2011  . Cervicalgia 12/16/2011  . Sleep apnea 12/16/2011  . Obesity, Class III, BMI 40-49.9 (morbid obesity) ([place])     Immunization History  Administered Date(s) Administered  . Fluad Quad(high Dose 65+) 08/31/2019, 09/03/2020  . Influenza, High Dose Seasonal PF 09/09/2017, 02/09/2019  . PFIZER(Purple Top)SARS-COV-2 Vaccination 01/26/2020, 02/16/2020, 10/11/2020  . Pneumococcal Conjugate-13 02/06/2015  . Pneumococcal Polysaccharide-23 09/20/2010, 09/09/2017  . Tdap 09/20/2013  . Zoster 06/13/2014  . Zoster Recombinat (Shingrix) 12/12/2020, 04/04/2021    Conditions to be addressed/monitored: HTN, HLD and DMII  Care Plan : Medication Management  Updates made by De Hollingshead, RPH-CPP since 04/11/2021 12:00 AM    Problem: Diabetes, Weight Management, HTN     Long-Range Goal: Disease Progression Prevented or Minimized   Start Date: 10/10/2020  This Visit's Progress: On track  Recent Progress: On track  Priority: High  Note:   Current Barriers:  . Unable to independently afford treatment regimen . Unable to independently monitor therapeutic efficacy . Unable to achieve control of diabetes   Pharmacist Clinical Goal(s):  [name] Over the next 90 days, patient will verbalize ability to afford treatment regimen. . Over the next 90 days,  achieve adherence to monitoring guidelines and medication adherence to achieve therapeutic efficacy. . Over the next 90 days, achieve control of diabetes as evidenced by improvement in A1c through collaboration with PharmD and provider.   Interventions: . 1:1 collaboration with [name], MD regarding development and update of comprehensive plan of care as evidenced by provider  attestation and co-signature . Inter-disciplinary care team collaboration (see longitudinal plan of care) . Comprehensive medication review performed; medication list updated in electronic medical record  SDOH: . Doing things around her house. Painting lightswitch plates.   Diabetes: . CONTROLLED; current treatment: Ozempic 1 mg weekly; metformin XR 1000 mg daily  . Does note some heartburn, but she can't find a trend. Doesn't eat late in the day (tries to stop eating by 4-5 pm).  . Most recent home weight: 278 lbs; baseline weight: 332 lbs; 16% weight loss from baseline . Approved for Ozempic assistance through [employer] through 10/30/21. . Current meal patterns: very few processed foods, zero carb creamer; majority of the time is eating fruits, vegetables, lean proteins, whole grains. Focusing on smaller portion sizes, 2-3 meals daily. Less diet pepsi . Current physical activity: more physically active, no formal activity.  . Current glucose readings: at goal 99% of the time. Using [place] 2  . Continue Ozempic 1 mg, metformin XR 1000 mg. Congratulated patient on continued weight loss success.  . Encouraged focus on physical activity, healthy eating habits. Discussed duration of therapy. Recommend continuation for as long as patient still tolerates.  . [employer] to cancel order for [place].   Hypertension: . Controlled per last visit; current treatment: losartan 100 mg QAM, triamterene/HCTZ 37.5/25 mg QAM, amlodipine 10 mg QPM . Encouraged home BP monitoring, especially if continued weight loss.  BP at goal last visit. . Continue current regimen at this time with occasional home monitoring.   Hyperlipidemia: . Controlled per last lipid panel; current treatment: atorvastatin 20 mg daily  . Recommended to continue current regimen  at this time.  Asthma: . Controlled; current treatment: montelukast 10 mg daily, [place] PRN, albuterol HFA PRN 1-2 times monthly. Nebulizer only required when she has respiratory infection . Recommended to continue current regimen  given lack of symptoms  Cervicalgia: . Appropriately managed; current treatment: gabapentin 600 mg TID . Recommend to continue current regimen at this time. Monitor for sedation.   Patient Goals/Self-Care Activities . Over the next 90 days, patient will:  - take medications as prescribed check blood glucose three times daily using CGM, document, and provide at future appointments collaborate with provider on medication access solutions  Follow Up Plan: Telephone follow up appointment with care management team member scheduled for: ~12 weeks      Medication Assistance: Ozempic obtained through [employer]  medication assistance program.  Enrollment ends 11/30/21  Patient's preferred pharmacy is:  [place], [place] [address] [place] [address] Phone: [phone] Fax: [phone]   Follow Up:  Patient agrees to Care Plan and Follow-up.  Plan: Telephone follow up appointment with care management team member scheduled for:  ~12 weeks  [name], PharmD, [place], [place] Clinical Pharmacist [employer] at [employer] [phone]

## 2021-04-15 DIAGNOSIS — E1165 Type 2 diabetes mellitus with hyperglycemia: Secondary | ICD-10-CM | POA: Diagnosis not present

## 2021-04-16 ENCOUNTER — Telehealth: Payer: Self-pay

## 2021-04-16 ENCOUNTER — Ambulatory Visit: Payer: Medicare Other | Admitting: Internal Medicine

## 2021-04-22 ENCOUNTER — Encounter: Payer: Self-pay | Admitting: Internal Medicine

## 2021-04-22 ENCOUNTER — Other Ambulatory Visit: Payer: Self-pay

## 2021-04-22 ENCOUNTER — Ambulatory Visit: Payer: Medicare Other | Admitting: Internal Medicine

## 2021-04-22 VITALS — BP 138/70 | HR 69 | Temp 98.1°F | Resp 16 | Ht 67.0 in | Wt 268.2 lb

## 2021-04-22 DIAGNOSIS — Z9989 Dependence on other enabling machines and devices: Secondary | ICD-10-CM | POA: Diagnosis not present

## 2021-04-22 DIAGNOSIS — G4733 Obstructive sleep apnea (adult) (pediatric): Secondary | ICD-10-CM

## 2021-04-22 DIAGNOSIS — J4520 Mild intermittent asthma, uncomplicated: Secondary | ICD-10-CM

## 2021-04-22 NOTE — Patient Instructions (Signed)

## 2021-04-22 NOTE — Progress Notes (Signed)
[facility] [place], [place] [address]  Pulmonary Sleep Medicine   Office Visit Note  Patient Name: Katie Huber DOB: January 04, 1951 MRN [id number]  Date of Service: 04/22/2021  Complaints/HPI: Asthma follow up. She has been doing well. She states no wheeze no cough is noted. Denies having any exacerbation. She is on her albuterol advair for mainstay. She has not had any admissions. She is not sleeping well. She states she hates the CPAP but still is compliant with the machine. Overall she doesn't sleep as well which she relates to the age  ROS  General: (-) fever, (-) chills, (-) night sweats, (-) weakness Skin: (-) rashes, (-) itching,. Eyes: (-) visual changes, (-) redness, (-) itching. Nose and Sinuses: (-) nasal stuffiness or itchiness, (-) postnasal drip, (-) nosebleeds, (-) sinus trouble. Mouth and Throat: (-) sore throat, (-) hoarseness. Neck: (-) swollen glands, (-) enlarged thyroid, (-) neck pain. Respiratory: - cough, (-) bloody sputum, - shortness of breath, - wheezing. Cardiovascular: - ankle swelling, (-) chest pain. Lymphatic: (-) lymph node enlargement. Neurologic: (-) numbness, (-) tingling. Psychiatric: (-) anxiety, (-) depression   Current Medication: Outpatient Encounter Medications as of 04/22/2021  Medication Sig   acyclovir (ZOVIRAX) 400 MG tablet TAKE ONE TABLET BY MOUTH  FIVE TIMES DAILY (Patient taking differently: Take 400 mg by mouth as needed. TAKE ONE TABLET BY MOUTH FIVE TIMES DAILY)   albuterol (PROVENTIL HFA;VENTOLIN HFA) 108 (90 Base) MCG/ACT inhaler Inhale 2 puffs into the lungs every 6 (six) hours as needed for wheezing or shortness of breath.   albuterol (PROVENTIL) (2.5 MG/3ML) 0.083% nebulizer solution Take 3 mLs (2.5 mg total) by nebulization every 6 (six) hours as needed for wheezing or shortness of breath.   amLODipine (NORVASC) 10 MG tablet Take 1 tablet (10 mg total) by mouth daily.   atorvastatin (LIPITOR) 20 MG  tablet TAKE ONE TABLET EVERY DAY   furosemide (LASIX) 20 MG tablet TAKE ONE TABLET EVERY DAY AS NEEDED FOR FLUID RETENTION   gabapentin (NEURONTIN) 300 MG capsule TAKE 2 CAPSULES 3 TIMES DAILY   glucose blood test strip OneTouch Verio Fex.    Test one time daily for steroid induced diabetes   Insulin Pen Needle (PEN NEEDLES) 32G X 6 MM MISC 1 application by Does not apply route daily. Use to inject insulin once daily.   losartan (COZAAR) 100 MG tablet TAKE ONE TABLET EVERY DAY   metFORMIN (GLUCOPHAGE-XR) 500 MG 24 hr tablet Take 2 tablets (1,000 mg total) by mouth daily.   montelukast (SINGULAIR) 10 MG tablet Take 1 tablet (10 mg total) by mouth at bedtime.   omeprazole (PRILOSEC) 20 MG capsule TAKE 1 CAPSULE TWICE DAILY   PARoxetine (PAXIL) 20 MG tablet TAKE ONE TABLET EVERY MORNING   potassium chloride SA (KLOR-CON) 20 MEQ tablet TAKE 1 TABLET BY MOUTH  DAILY WHEN USING FUROSEMIDE   promethazine (PHENERGAN) 25 MG tablet TAKE 1 TABLET BY MOUTH  EVERY 8 HOURS AS NEEDED FOR NAUSEA AND VOMITING   Semaglutide, 1 MG/DOSE, (OZEMPIC, 1 MG/DOSE,) 4 MG/3ML SOPN Inject 1 mg into the skin once a week.   triamterene-hydrochlorothiazide (MAXZIDE-25) 37.5-25 MG tablet TAKE ONE TABLET BY MOUTH EVERY DAY   VITAMIN D, CHOLECALCIFEROL, PO Take 1 tablet by mouth daily.   zolpidem (AMBIEN) 10 MG tablet Take 1 tablet (10 mg total) by mouth at bedtime as needed. for sleep   Facility-Administered Encounter Medications as of 04/22/2021  Medication   pneumococcal 13-valent conjugate vaccine (PREVNAR 13)  injection 0.5 mL    Surgical History: Past Surgical History:  Procedure Laterality Date   ABDOMINAL HYSTERECTOMY  2001   BREAST BIOPSY Right    Neg   BREAST EXCISIONAL BIOPSY Right 1990s   multiple hematomas   CHOLECYSTECTOMY  1970s   COLONOSCOPY WITH PROPOFOL N/A 03/30/2018   Procedure: COLONOSCOPY WITH PROPOFOL;  Surgeon: Lucilla Lame, MD;  Location: [facility] ENDOSCOPY;  Service: Endoscopy;  Laterality: N/A;    JOINT REPLACEMENT     Bilateral   STOMACH SURGERY  [phone]   gastric partitionin( for obesity)     Medical History: Past Medical History:  Diagnosis Date   Asthma 1990   Hypertension    Obesity, Class III, BMI 40-49.9 (morbid obesity) (HCC)    weight fluctuations of 100 lbs     Family History: Family History  Problem Relation Age of Onset   Heart disease Father    COPD Father    Hyperlipidemia Father    Mental illness Brother    Breast cancer Neg Hx     Social History: Social History   Socioeconomic History   Marital status: Single    Spouse name: Not on file   Number of children: Not on file   Years of education: Not on file   Highest education level: Not on file  Occupational History   Not on file  Tobacco Use   Smoking status: Never Smoker   Smokeless tobacco: Never Used  Vaping Use   Vaping Use: Never used  Substance and Sexual Activity   Alcohol use: Yes    Comment: ocassionally   Drug use: No   Sexual activity: Never  Other Topics Concern   Not on file  Social History Narrative   Not on file   Social Determinants of Health   Financial Resource Strain: Medium Risk   Difficulty of Paying Living Expenses: Somewhat hard  Food Insecurity: No Food Insecurity   Worried About Charity fundraiser in the Last Year: Never true   Ran Out of Food in the Last Year: Never true  Transportation Needs: No Transportation Needs   Lack of Transportation (Medical): No   Lack of Transportation (Non-Medical): No  Physical Activity: Not on file  Stress: No Stress Concern Present   Feeling of Stress : Not at all  Social Connections: Unknown   Frequency of Communication with Friends and Family: More than three times a week   Frequency of Social Gatherings with Friends and Family: More than three times a week   Attends Religious Services: More than 4 times per year   Active Member of [employer] or Organizations: Yes   Attends Music therapist: Not on file    Marital Status: Not on file  Intimate Partner Violence: Not At Risk   Fear of Current or Ex-Partner: No   Emotionally Abused: No   Physically Abused: No   Sexually Abused: No    Vital Signs: Blood pressure 138/70, pulse 69, temperature 98.1 F (36.7 C), resp. rate 16, height 5\' 7"  (1.702 m), weight 268 lb 3.2 oz (121.7 kg), SpO2 97 %.  Examination: General Appearance: The patient is well-developed, well-nourished, and in no distress. Skin: Gross inspection of skin unremarkable. Head: normocephalic, no gross deformities. Eyes: no gross deformities noted. ENT: ears appear grossly normal no exudates. Neck: Supple. No thyromegaly. No LAD. Respiratory: no rhonchi . Cardiovascular: Normal S1 and S2 without murmur or rub. Extremities: No cyanosis. pulses are equal. Neurologic: Alert and oriented. No involuntary movements.  LABS: No results found for this or any previous visit (from the past 2160 hour(s)).  Radiology: MM 3D SCREEN BREAST BILATERAL  Result Date: 01/10/2021 CLINICAL DATA:  Screening. EXAM: DIGITAL SCREENING BILATERAL MAMMOGRAM WITH TOMOSYNTHESIS AND CAD TECHNIQUE: Bilateral screening digital craniocaudal and mediolateral oblique mammograms were obtained. Bilateral screening digital breast tomosynthesis was performed. The images were evaluated with computer-aided detection. COMPARISON:  Previous exam(s). ACR Breast Density Category b: There are scattered areas of fibroglandular density. FINDINGS: There are no findings suspicious for malignancy. IMPRESSION: No mammographic evidence of malignancy. A result letter of this screening mammogram will be mailed directly to the patient. RECOMMENDATION: Screening mammogram in one year. (Code:SM-B-01Y) BI-RADS CATEGORY  1: Negative. Electronically Signed   By: Fidela Salisbury M.D.   On: 01/10/2021 16:53    No results found.  No results found.    Assessment and Plan: Patient Active Problem List   Diagnosis Date Noted    Onychomycosis of right great toe 03/04/2021   Coronary artery calcification of native artery 11/10/2020   Abdominal aortic atherosclerosis ([place]) 11/10/2020   Oral cavity pain 01/09/2020   Vitamin D deficiency 01/09/2020   Diarrhea, functional 07/26/2018   Tubular adenoma of colon 04/01/2018   Benign neoplasm of cecum    Encounter for screening colonoscopy    Rectal polyp    B12 deficiency 01/26/2018   Chronic respiratory failure with hypoxia ([place]) 12/11/2017   Hospital discharge follow-up 03/14/2017   Mild intermittent asthma 03/04/2017   Herpes simplex infection 08/16/2015   Skin neoplasm 08/16/2015   Pulmonary hypertension ([place]) 02/06/2015   S/P vaginal hysterectomy 06/13/2014   Encounter for preventive health examination 06/13/2014   Essential hypertension, benign 12/27/2013   S/P bariatric surgery 09/20/2013   Insomnia due to anxiety and fear 06/14/2013   Type 2 diabetes mellitus with obesity ([place]) 03/02/2013   Hyperlipidemia associated with type 2 diabetes mellitus ([place]) 12/17/2011   Encounter for long-term (current) use of other medications 12/17/2011   Cervicalgia 12/16/2011   Sleep apnea 12/16/2011   Obesity, Class III, BMI 40-49.9 (morbid obesity) ([place])     1. Mild intermittent asthma without complication Under good control right now on the current inhaler regimen.  She has not had any major issues with the medications minimal to no side effects are noted.  Plan is going to be to continue with current inhalers as prescribed.  2. OSA on CPAP On PAP therapy which she is tolerating well.  Discussed the importance of keeping the CPAP device clean the water chamber clean and making sure that her supplies are up-to-date.  3. Morbid obesity ([place]) Doing well also with weight loss she continues to try to remain active and try to work on losing weight  General Counseling: I have discussed the findings of the evaluation and examination with Ellary.  I have also discussed any  further diagnostic evaluation thatmay be needed or ordered today. Berda verbalizes understanding of the findings of todays visit. We also reviewed her medications today and discussed drug interactions and side effects including but not limited excessive drowsiness and altered mental states. We also discussed that there is always a risk not just to her but also people around her. she has been encouraged to call the office with any questions or concerns that should arise related to todays visit.  No orders of the defined types were placed in this encounter.    Time spent: 48  I have personally obtained a history, examined the patient, evaluated laboratory and imaging  results, formulated the assessment and plan and placed orders.    Allyne Gee, MD [facility] Pulmonary and Critical Care Sleep medicine

## 2021-05-01 ENCOUNTER — Other Ambulatory Visit: Payer: Self-pay | Admitting: Internal Medicine

## 2021-05-07 ENCOUNTER — Telehealth: Payer: Self-pay

## 2021-05-07 NOTE — Telephone Encounter (Signed)
Received pt assistance medication. Pt is aware that medication is ready for pick up.   Ozempic: 4 boxes

## 2021-05-16 DIAGNOSIS — E1165 Type 2 diabetes mellitus with hyperglycemia: Secondary | ICD-10-CM | POA: Diagnosis not present

## 2021-06-05 ENCOUNTER — Other Ambulatory Visit: Payer: Self-pay | Admitting: Internal Medicine

## 2021-06-11 ENCOUNTER — Encounter: Payer: Self-pay | Admitting: Internal Medicine

## 2021-06-11 ENCOUNTER — Other Ambulatory Visit: Payer: Self-pay

## 2021-06-11 ENCOUNTER — Ambulatory Visit (INDEPENDENT_AMBULATORY_CARE_PROVIDER_SITE_OTHER): Payer: Medicare Other | Admitting: Internal Medicine

## 2021-06-11 VITALS — BP 134/56 | HR 76 | Temp 95.9°F | Resp 15 | Ht 67.0 in | Wt 256.0 lb

## 2021-06-11 DIAGNOSIS — F339 Major depressive disorder, recurrent, unspecified: Secondary | ICD-10-CM | POA: Insufficient documentation

## 2021-06-11 DIAGNOSIS — E1169 Type 2 diabetes mellitus with other specified complication: Secondary | ICD-10-CM

## 2021-06-11 DIAGNOSIS — E669 Obesity, unspecified: Secondary | ICD-10-CM | POA: Diagnosis not present

## 2021-06-11 MED ORDER — ALPRAZOLAM 0.25 MG PO TABS: 0.2500 mg | ORAL_TABLET | Freq: Two times a day (BID) | ORAL | 0 refills | Status: DC | PRN

## 2021-06-11 NOTE — Assessment & Plan Note (Signed)
She has lost 80 lbs since starting Ozempic for diabetes and obesity

## 2021-06-11 NOTE — Patient Instructions (Signed)
I recommend Miguel Dibble as Personal assistant.  If she is not [place],  she should be able to direct you toward another counsellor  Aplrazolam prn panic attacks.   Return to see me in one month

## 2021-06-11 NOTE — Assessment & Plan Note (Signed)
Dramatic improvement in control of diabetes and appetite with initiation of Ozempic. Last a1c was very low and insulin dose has been reduced

## 2021-06-11 NOTE — Assessment & Plan Note (Signed)
Continue paxil 20 mg daily.  Adding alprazolam for panic attacks.  Referrals for counselling
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
## 2021-06-12 LAB — COMPREHENSIVE METABOLIC PANEL
ALT: 17 U/L (ref 0–35)
AST: 18 U/L (ref 0–37)
Albumin: 4.5 g/dL (ref 3.5–5.2)
Alkaline Phosphatase: 60 U/L (ref 39–117)
BUN: 12 mg/dL (ref 6–23)
CO2: 30 mEq/L (ref 19–32)
Calcium: 11.2 mg/dL — ABNORMAL HIGH (ref 8.4–10.5)
Chloride: 99 mEq/L (ref 96–112)
Creatinine, Ser: 0.69 mg/dL (ref 0.40–1.20)
GFR: 88.14 mL/min (ref >60.00)
Glucose, Bld: 105 mg/dL — ABNORMAL HIGH (ref 70–99)
Potassium: 4.1 mEq/L (ref 3.5–5.1)
Sodium: 137 mEq/L (ref 135–145)
Total Bilirubin: 0.6 mg/dL (ref 0.2–1.2)
Total Protein: 6.7 g/dL (ref 6.0–8.3)

## 2021-06-12 LAB — LIPID PANEL
Cholesterol: 153 mg/dL (ref 0–200)
HDL: 57.1 mg/dL (ref >39.00)
LDL Cholesterol: 68 mg/dL (ref 0–99)
NonHDL: 95.57
Total CHOL/HDL Ratio: 3
Triglycerides: 139 mg/dL (ref 0.0–149.0)
VLDL: 27.8 mg/dL (ref 0.0–40.0)

## 2021-06-12 LAB — HEMOGLOBIN A1C: Hgb A1c MFr Bld: 5.2 % (ref 4.6–6.5)

## 2021-06-17 ENCOUNTER — Telehealth: Payer: Self-pay

## 2021-06-17 DIAGNOSIS — E1165 Type 2 diabetes mellitus with hyperglycemia: Secondary | ICD-10-CM | POA: Diagnosis not present

## 2021-06-17 NOTE — Telephone Encounter (Signed)
LMTCB in regards to lab results.  

## 2021-06-17 NOTE — Addendum Note (Signed)
Addended by: [name] on: 06/17/2021 03:15 PM   Modules accepted: Orders

## 2021-06-17 NOTE — Telephone Encounter (Signed)
PT called back to get results

## 2021-06-18 NOTE — Telephone Encounter (Signed)
See result note message 

## 2021-06-20 DIAGNOSIS — E119 Type 2 diabetes mellitus without complications: Secondary | ICD-10-CM | POA: Diagnosis not present

## 2021-06-20 DIAGNOSIS — Z7984 Long term (current) use of oral hypoglycemic drugs: Secondary | ICD-10-CM | POA: Diagnosis not present

## 2021-06-20 DIAGNOSIS — H524 Presbyopia: Secondary | ICD-10-CM | POA: Diagnosis not present

## 2021-06-20 DIAGNOSIS — H2513 Age-related nuclear cataract, bilateral: Secondary | ICD-10-CM | POA: Diagnosis not present

## 2021-06-20 DIAGNOSIS — H52223 Regular astigmatism, bilateral: Secondary | ICD-10-CM | POA: Diagnosis not present

## 2021-06-20 DIAGNOSIS — H5203 Hypermetropia, bilateral: Secondary | ICD-10-CM | POA: Diagnosis not present

## 2021-06-20 LAB — HM DIABETES EYE EXAM

## 2021-06-24 ENCOUNTER — Other Ambulatory Visit (INDEPENDENT_AMBULATORY_CARE_PROVIDER_SITE_OTHER): Payer: Medicare Other

## 2021-06-24 ENCOUNTER — Other Ambulatory Visit: Payer: Self-pay | Admitting: Internal Medicine

## 2021-06-24 ENCOUNTER — Other Ambulatory Visit: Payer: Self-pay

## 2021-06-24 DIAGNOSIS — E1169 Type 2 diabetes mellitus with other specified complication: Secondary | ICD-10-CM

## 2021-06-24 DIAGNOSIS — E785 Hyperlipidemia, unspecified: Secondary | ICD-10-CM

## 2021-06-25 LAB — BASIC METABOLIC PANEL
BUN: 10 mg/dL (ref 6–23)
CO2: 28 mEq/L (ref 19–32)
Calcium: 10.1 mg/dL (ref 8.4–10.5)
Chloride: 101 mEq/L (ref 96–112)
Creatinine, Ser: 0.57 mg/dL (ref 0.40–1.20)
GFR: 92.27 mL/min (ref >60.00)
Glucose, Bld: 96 mg/dL (ref 70–99)
Potassium: 4.1 mEq/L (ref 3.5–5.1)
Sodium: 136 mEq/L (ref 135–145)

## 2021-06-25 LAB — VITAMIN D 25 HYDROXY (VIT D DEFICIENCY, FRACTURES): VITD: 28.11 ng/mL — ABNORMAL LOW (ref 30.00–100.00)

## 2021-06-26 LAB — PTH, INTACT AND CALCIUM
Calcium: 10.1 mg/dL (ref 8.6–10.4)
PTH: 19 pg/mL (ref 16–77)

## 2021-06-26 NOTE — Assessment & Plan Note (Signed)
Repeat ionized is normal and pth is normal

## 2021-07-09 ENCOUNTER — Other Ambulatory Visit: Payer: Self-pay | Admitting: Internal Medicine

## 2021-07-11 ENCOUNTER — Encounter: Payer: Self-pay | Admitting: Internal Medicine

## 2021-07-11 ENCOUNTER — Ambulatory Visit (INDEPENDENT_AMBULATORY_CARE_PROVIDER_SITE_OTHER): Payer: Medicare Other | Admitting: Internal Medicine

## 2021-07-11 ENCOUNTER — Other Ambulatory Visit: Payer: Self-pay

## 2021-07-11 DIAGNOSIS — F339 Major depressive disorder, recurrent, unspecified: Secondary | ICD-10-CM | POA: Diagnosis not present
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
## 2021-07-14 NOTE — Assessment & Plan Note (Signed)
Symptoms have improved significantly.  She is in therapy now with Miguel Dibble and feeling more optimistic. Less regret .  No changes to medication today

## 2021-07-18 DIAGNOSIS — E1165 Type 2 diabetes mellitus with hyperglycemia: Secondary | ICD-10-CM | POA: Diagnosis not present

## 2021-07-24 ENCOUNTER — Ambulatory Visit (INDEPENDENT_AMBULATORY_CARE_PROVIDER_SITE_OTHER): Payer: Medicare Other | Admitting: Pharmacist

## 2021-07-24 DIAGNOSIS — E669 Obesity, unspecified: Secondary | ICD-10-CM | POA: Diagnosis not present

## 2021-07-24 DIAGNOSIS — E1169 Type 2 diabetes mellitus with other specified complication: Secondary | ICD-10-CM | POA: Diagnosis not present

## 2021-07-24 DIAGNOSIS — I10 Essential (primary) hypertension: Secondary | ICD-10-CM | POA: Diagnosis not present

## 2021-07-24 DIAGNOSIS — E785 Hyperlipidemia, unspecified: Secondary | ICD-10-CM

## 2021-07-24 DIAGNOSIS — Z6839 Body mass index (BMI) 39.0-39.9, adult: Secondary | ICD-10-CM

## 2021-07-24 NOTE — Chronic Care Management (AMB) (Signed)
Chronic Care Management Pharmacy Note  07/24/2021 Name:  Katie Huber MRN:  [id number] DOB:  1951-03-30   Subjective: Katie Huber is an 70 y.o. year old female who is a primary patient of Tullo, Aris Everts, MD.  The CCM team was consulted for assistance with disease management and care coordination needs.    Engaged with patient by telephone for follow up visit in response to provider referral for pharmacy case management and/or care coordination services.   Consent to Services:  The patient was given information about Chronic Care Management services, agreed to services, and gave verbal consent prior to initiation of services.  Please see initial visit note for detailed documentation.   Patient Care Team: [name], MD as PCP - General (Internal Medicine) De Hollingshead, RPH-CPP (Pharmacist)  Recent office visits: 7/12 - PCP - A1c 5.2%, LDL 68, eGFR 92, Vit D low - add supplement 8/11 - PCP - f/u depression. Has therapist.   Recent consult visits: 5/23 [facility] pulmonary/sleep medicine  Objective:  Lab Results  Component Value Date   CREATININE 0.57 06/24/2021   CREATININE 0.69 06/11/2021   CREATININE 0.95 11/07/2020    Lab Results  Component Value Date   HGBA1C 5.2 06/11/2021   Last diabetic Eye exam:  Lab Results  Component Value Date/Time   HMDIABEYEEXA No Retinopathy 06/20/2021 12:00 AM    Last diabetic Foot exam:  Lab Results  Component Value Date/Time   HMDIABFOOTEX normal 06/13/2014 12:00 AM        Component Value Date/Time   CHOL 153 06/11/2021 1456   TRIG 139.0 06/11/2021 1456   HDL 57.10 06/11/2021 1456   CHOLHDL 3 06/11/2021 1456   VLDL 27.8 06/11/2021 1456   LDLCALC 68 06/11/2021 1456   LDLDIRECT 69.0 06/05/2017 0942    Hepatic Function Latest Ref Rng & Units 06/11/2021 08/29/2020 01/30/2020  Total Protein 6.0 - 8.3 g/dL 6.7 6.7 7.0  Albumin 3.5 - 5.2 g/dL 4.5 4.2 3.9  AST 0 - 37 U/L _0 ALT 0 - 35 U/L 17 46(H) 14  Alk  Phosphatase 39 - 117 U/L 60 87 75  Total Bilirubin 0.2 - 1.2 mg/dL 0.6 0.6 0.7  Bilirubin, Direct 0.0 - 0.3 mg/dL - - -    Lab Results  Component Value Date/Time   TSH 1.08 06/08/2014 10:56 AM   TSH 1.05 02/11/2013 09:48 AM    CBC Latest Ref Rng & Units 03/19/2020 01/30/2020 03/09/2017  WBC 4.0 - 10.5 K/uL 7.6 4.5 5.7  Hemoglobin 12.0 - 15.0 g/dL 14.2 13.4 13.0  Hematocrit 36.0 - 46.0 % 42.3 40.1 38.6  Platelets 150 - 400 K/uL 218 217.0 196    Lab Results  Component Value Date/Time   VD25OH 28.11 (L) 06/24/2021 03:21 PM   VD25OH 19.07 (L) 01/30/2020 11:14 AM    Clinical ASCVD: No  The 10-year ASCVD risk score Mikey Bussing DC Jr., et al., 2013) is: 19.4%   Values used to calculate the score:     Age: 84 years     Sex: Female     Is Non-Hispanic African American: No     Diabetic: Yes     Tobacco smoker: No     Systolic Blood Pressure: 967 mmHg     Is BP treated: Yes     HDL Cholesterol: 57.1 mg/dL     Total Cholesterol: 153 mg/dL      Social History   Tobacco Use  Smoking Status Never  Smokeless Tobacco Never  BP Readings from Last 3 Encounters:  07/11/21 (!) 122/58  06/11/21 (!) 134/56  04/22/21 138/70   Pulse Readings from Last 3 Encounters:  07/11/21 69  06/11/21 76  04/22/21 69   Wt Readings from Last 3 Encounters:  07/11/21 250 lb 6.4 oz (113.6 kg)  06/11/21 256 lb (116.1 kg)  04/22/21 268 lb 3.2 oz (121.7 kg)    Assessment: Review of patient past medical history, allergies, medications, health status, including review of consultants reports, laboratory and other test data, was performed as part of comprehensive evaluation and provision of chronic care management services.   SDOH:  (Social Determinants of Health) assessments and interventions performed:  SDOH Interventions    Flowsheet Row Most Recent Value  SDOH Interventions   Financial Strain Interventions Other (Comment)  [manufacturer assistance]       CCM Care Plan  No Known  Allergies  Medications Reviewed Today     Reviewed by De Hollingshead, RPH-CPP (Pharmacist) on 07/24/21 at 1  Med List Status: <None>   Medication Order Taking? Sig Documenting Provider Last Dose Status Informant  acyclovir (ZOVIRAX) 400 MG tablet [id number] Yes TAKE ONE TABLET BY MOUTH  FIVE TIMES DAILY  Patient taking differently: Take 400 mg by mouth as needed. TAKE ONE TABLET BY MOUTH FIVE TIMES DAILY   [name], MD Taking Active   albuterol (PROVENTIL HFA;VENTOLIN HFA) 108 (90 Base) MCG/ACT inhaler [id number] Yes Inhale 2 puffs into the lungs every 6 (six) hours as needed for wheezing or shortness of breath. Kendell Bane, NP Taking Active   albuterol (PROVENTIL) (2.5 MG/3ML) 0.083% nebulizer solution [id number] Yes Take 3 mLs (2.5 mg total) by nebulization every 6 (six) hours as needed for wheezing or shortness of breath. Kendell Bane, NP Taking Active   ALPRAZolam Duanne Moron) 0.25 MG tablet [id number] Yes Take 1 tablet (0.25 mg total) by mouth 2 (two) times daily as needed for anxiety. [name], MD Taking Active   amLODipine (NORVASC) 10 MG tablet [id number] Yes TAKE 1 TABLET BY MOUTH DAILY [name], MD Taking Active   atorvastatin (LIPITOR) 20 MG tablet [id number] Yes TAKE ONE TABLET EVERY DAY [name], MD Taking Active   furosemide (LASIX) 20 MG tablet [id number] Yes TAKE ONE TABLET EVERY DAY AS NEEDED FOR FLUID RETENTION [name], MD Taking Active   gabapentin (NEURONTIN) 300 MG capsule [id number] Yes TAKE 2 CAPSULES 3 TIMES DAILY [name], MD Taking Active   glucose blood test strip [id number]  OneTouch Verio Fex.    Test one time daily for steroid induced diabetes [name], MD  Active   Insulin Pen Needle (PEN NEEDLES) 32G X 6 MM MISC [id number]  1 application by Does not apply route daily. Use to inject insulin once daily. [name], MD  Active   losartan (COZAAR) 100 MG tablet [id number] Yes TAKE ONE TABLET EVERY DAY [name], MD Taking Active   metFORMIN (GLUCOPHAGE-XR) 500 MG 24 hr tablet [id number]  Take 2 tablets (1,000 mg total) by mouth daily. [name], MD  Active   montelukast (SINGULAIR) 10 MG tablet [id number] Yes Take 1 tablet (10 mg total) by mouth at bedtime. [name], MD Taking Active   omeprazole (PRILOSEC) 20 MG capsule [id number] Yes TAKE 1 CAPSULE TWICE DAILY [name], MD Taking Active   PARoxetine (PAXIL) 20 MG tablet [id number] Yes TAKE ONE TABLET EVERY MORNING [name], MD Taking Active  pneumococcal 13-valent conjugate vaccine (PREVNAR 13) injection 0.5 mL [id number]   [name], MD  Active   potassium chloride SA (KLOR-CON) 20 MEQ tablet [id number] Yes TAKE 1 TABLET BY MOUTH  DAILY WHEN USING FUROSEMIDE [name], MD Taking Active   promethazine (PHENERGAN) 25 MG tablet [id number] Yes TAKE 1 TABLET BY MOUTH  EVERY 8 HOURS AS NEEDED FOR NAUSEA AND VOMITING [name], MD Taking Active   Semaglutide, 1 MG/DOSE, (OZEMPIC, 1 MG/DOSE,) 4 MG/3ML SOPN [id number] Yes Inject 1 mg into the skin once a week. [name], MD Taking Active   triamterene-hydrochlorothiazide [facility]) 37.5-25 MG tablet [id number] Yes TAKE 1 TABLET BY MOUTH DAILY [name], MD Taking Active   VITAMIN D, CHOLECALCIFEROL, PO [id number] Yes Take 1 tablet by mouth daily. [provider] Taking Active Self  zolpidem (AMBIEN) 10 MG tablet [id number] Yes Take 1 tablet (10 mg total) by mouth at bedtime as needed. for sleep [name], MD Taking Active             Patient Active Problem List   Diagnosis Date Noted   Hypercalcemia 06/17/2021   Major depressive disorder, recurrent episode with anxious distress ([place]) 06/11/2021   Onychomycosis of right great toe 03/04/2021   Coronary artery calcification of native artery 11/10/2020   Abdominal aortic atherosclerosis ([place]) 11/10/2020   Oral cavity pain 01/09/2020   Vitamin D deficiency 01/09/2020   Diarrhea,  functional 07/26/2018   Tubular adenoma of colon 04/01/2018   Benign neoplasm of cecum    Encounter for screening colonoscopy    Rectal polyp    B12 deficiency 01/26/2018   Chronic respiratory failure with hypoxia ([place]) 12/11/2017   Hospital discharge follow-up 03/14/2017   Mild intermittent asthma 03/04/2017   Herpes simplex infection 08/16/2015   Skin neoplasm 08/16/2015   Pulmonary hypertension ([place]) 02/06/2015   S/P vaginal hysterectomy 06/13/2014   Encounter for preventive health examination 06/13/2014   Essential hypertension, benign 12/27/2013   S/P bariatric surgery 09/20/2013   Insomnia due to anxiety and fear 06/14/2013   Type 2 diabetes mellitus with obesity ([place]) 03/02/2013   Hyperlipidemia associated with type 2 diabetes mellitus ([place]) 12/17/2011   Encounter for long-term (current) use of other medications 12/17/2011   Cervicalgia 12/16/2011   Sleep apnea 12/16/2011   Obesity, Class III, BMI 40-49.9 (morbid obesity) ([place])     Immunization History  Administered Date(s) Administered   Fluad Quad(high Dose 65+) 08/31/2019, 09/03/2020   Influenza, High Dose Seasonal PF 09/09/2017, 02/09/2019   PFIZER(Purple Top)SARS-COV-2 Vaccination 01/26/2020, 02/16/2020, 10/11/2020   Pneumococcal Conjugate-13 02/06/2015   Pneumococcal Polysaccharide-23 09/20/2010, 09/09/2017   Tdap 09/20/2013   Zoster Recombinat (Shingrix) 12/12/2020, 04/04/2021   Zoster, Live 06/13/2014    Conditions to be addressed/monitored: HTN, HLD, and DMII  Care Plan : Medication Management  Updates made by De Hollingshead, RPH-CPP since 07/24/2021 12:00 AM     Problem: Diabetes, Weight Management, HTN      Long-Range Goal: Disease Progression Prevented or Minimized   Start Date: 10/10/2020  This Visit's Progress: On track  Recent Progress: On track  Priority: High  Note:   Current Barriers:  Unable to independently afford treatment regimen Unable to independently monitor therapeutic  efficacy Unable to achieve control of diabetes   Pharmacist Clinical Goal(s):  Over the next 90 days, patient will verbalize ability to afford treatment regimen. Over the next 90 days, achieve adherence to monitoring guidelines and medication adherence to achieve therapeutic efficacy. Over  the next 90 days, achieve control of diabetes as evidenced by improvement in A1c through collaboration with PharmD and provider.   Interventions: 1:1 collaboration with [name], MD regarding development and update of comprehensive plan of care as evidenced by provider attestation and co-signature Inter-disciplinary care team collaboration (see longitudinal plan of care) Comprehensive medication review performed; medication list updated in electronic medical record  Diabetes: CONTROLLED; current treatment: Ozempic 1 mg weekly; metformin XR 1000 mg daily  Does note some heartburn. See below.  Most recent home weight: 250 lbs; baseline weight: 332 lbs; 25% weight loss from baseline Approved for Ozempic assistance through [employer] through 10/30/21. Current meal patterns: reports some more cravings if she sees pictures of food (like a [employer] commercial). Discussed ways of mitigating cravings, distracting herself, setting limits on portion sizes if she does treat herself.  Current physical activity: Signed up for a [employer]. Enrolled in a chair yoga class. Plans on working on core strength Discussed ability to titrate off metformin therapy due to well controlled A1c. Patient amenable. Decrease metformin to 500 mg daily. Patient will continue to monitor BG and let me know if significant increase in glucose readings.  Continue Ozempic 1 mg weekly. Discussed that we could consider increase moving forward, but given continued weight loss and control of A1c with some GI symptoms, we will elect to continue current regimen.   Hypertension: Controlled per last visit; current treatment: losartan 100 mg  QAM, triamterene/HCTZ 37.5/25 mg QAM, amlodipine 10 mg QPM Home BP readings: 120s/50-60s; denies any episodes of lightheadedness, dizziness to indicate hypotension.  Discussed potential to reduce amlodipine to 5 mg daily. Patient would prefer to make 1 medication change at a time. Follow BP, continue to evaluate for hypotension with continued weight loss.  Continue current regimen at this time with occasional home monitoring.   Hyperlipidemia: Controlled per last lipid panel; current treatment: atorvastatin 20 mg daily  Recommended to continue current regimen  at this time.  Asthma: Controlled; current treatment: montelukast 10 mg daily, [place] PRN, albuterol HFA PRN 1-2 times monthly. Nebulizer only required when she has respiratory infection Recommended to continue current regimen  given lack of symptoms  Cervicalgia: Appropriately managed; current treatment: gabapentin 600 mg TID Recommend to continue current regimen at this time. Monitor for sedation.   GERD: Some periodic symptoms; current regimen: omeprazole 40 mg BID Reports some acid reflux symptoms, does not seem to be tied with laying down.  Recommended to take omeprazole 30 minutes before breakfast and supper. Discussed limiting trigger foods, like spicy or fatty foods. Discussed not laying down too soon after eating. Discussed use of PRN Tums or famotidine for breakthrough symptoms.   Patient Goals/Self-Care Activities Over the next 90 days, patient will:  - take medications as prescribed check blood glucose three times daily using CGM, document, and provide at future appointments collaborate with provider on medication access solutions  Follow Up Plan: Telephone follow up appointment with care management team member scheduled for: ~10 weeks      Medication Assistance:  Ozempic obtained through [employer] medication assistance program.  Enrollment ends 10/30/21  Patient's preferred pharmacy is:  [place], [place] - [address] [place] [address] Phone: [phone] Fax: [phone]   Follow Up:  Patient agrees to Care Plan and Follow-up.  Plan: Telephone follow up appointment with care management team member scheduled for:  ~ 13 weeks  [name], PharmD, [place], CPP  Contractor at [place]

## 2021-07-24 NOTE — Patient Instructions (Signed)
Visit Information  PATIENT GOALS:  Goals Addressed               This Visit's Progress     Patient Stated     Monitor and Manage My Blood Sugar (pt-stated)        Patient Goals/Self-Care Activities Over the next 90 days, patient will:  - take medications as prescribed check blood glucose three times daily using CGM, document, and provide at future appointments collaborate with provider on medication access solutions        Patient verbalizes understanding of instructions provided today and agrees to view in [place].   Plan: Telephone follow up appointment with care management team member scheduled for:  ~ 10 weeks  [name], PharmD, [place], [place] Clinical Pharmacist [employer] at [employer] [phone]

## 2021-07-30 ENCOUNTER — Other Ambulatory Visit: Payer: Self-pay | Admitting: Internal Medicine

## 2021-07-30 DIAGNOSIS — G4700 Insomnia, unspecified: Secondary | ICD-10-CM

## 2021-07-31 NOTE — Telephone Encounter (Signed)
RX Refill:ambien Last Seen:07-11-21 Last ordered:03-04-21

## 2021-08-15 ENCOUNTER — Telehealth: Payer: Self-pay

## 2021-08-15 NOTE — Telephone Encounter (Signed)
Patient picked up 4 boxes of Ozempic. 

## 2021-08-15 NOTE — Telephone Encounter (Signed)
Spoke with pt and informed her that we have received her pt assistance medication and it is ready to be picked up. Pt gave a verbal understanding.   Ozempic: 4 boxes

## 2021-08-18 DIAGNOSIS — E1165 Type 2 diabetes mellitus with hyperglycemia: Secondary | ICD-10-CM | POA: Diagnosis not present

## 2021-08-28 ENCOUNTER — Telehealth: Payer: Self-pay

## 2021-08-28 NOTE — Telephone Encounter (Signed)
Patient called needing appointment with dsk due to breathing getting bad. I explained he will not have anything available until next week. She stated she would go to ED if needed-Toni

## 2021-08-28 NOTE — Telephone Encounter (Signed)
The patient wants a call back regarding the appointment time offered.

## 2021-08-29 ENCOUNTER — Telehealth: Payer: Medicare Other | Admitting: Family Medicine

## 2021-08-29 ENCOUNTER — Encounter: Payer: Self-pay | Admitting: Internal Medicine

## 2021-08-29 ENCOUNTER — Telehealth (INDEPENDENT_AMBULATORY_CARE_PROVIDER_SITE_OTHER): Payer: Medicare Other | Admitting: Internal Medicine

## 2021-08-29 DIAGNOSIS — F339 Major depressive disorder, recurrent, unspecified: Secondary | ICD-10-CM | POA: Diagnosis not present

## 2021-08-29 DIAGNOSIS — Z91199 Patient's noncompliance with other medical treatment and regimen due to unspecified reason: Secondary | ICD-10-CM

## 2021-08-29 DIAGNOSIS — J069 Acute upper respiratory infection, unspecified: Secondary | ICD-10-CM | POA: Diagnosis not present

## 2021-08-29 MED ORDER — AZITHROMYCIN 250 MG PO TABS: ORAL_TABLET | ORAL | 0 refills | Status: AC

## 2021-08-29 MED ORDER — HYDROCOD POLST-CPM POLST ER 10-8 MG/5ML PO SUER: 5.0000 mL | Freq: Two times a day (BID) | ORAL | 0 refills | Status: DC | PRN

## 2021-08-29 MED ORDER — PREDNISONE 10 MG PO TABS: ORAL_TABLET | ORAL | 0 refills | Status: DC

## 2021-08-29 NOTE — Progress Notes (Signed)
Virtual Visit via CAREGILITY NOTE   This visit type was conducted due to national recommendations for restrictions regarding the COVID-19 pandemic (e.g. social distancing).  This format is felt to be most appropriate for this patient at this time.  All issues noted in this document were discussed and addressed.  No physical exam was performed (except for noted visual exam findings with Video Visits).   I connected with NAME@ on 08/29/21 at  8:30 AM EDT by a video enabled telemedicine application  and verified that I am speaking with the correct person using two identifiers. Location patient: home Location provider: work or home office Persons participating in the virtual visit: patient, provider  I discussed the limitations, risks, security and privacy concerns of performing an evaluation and management service by telephone and the availability of in person appointments. I also discussed with the patient that there may be a patient responsible charge related to this service. The patient expressed understanding and agreed to proceed.  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  Patient attended a women's retreat last week, stayed in a cabin with 3 other women , returning Sunday and developed pharyngitis Sunday evening followed by frequent cough productive of clear sputum.  Has been using albuterol nebs  frequently .  checked COVD test on Monday, home test was negative,  has not rechecked  since then.  Endorses moderate   Malaise.  No fevers.  Not checking sats.     ROS: See pertinent positives and negatives per HPI.  Past Medical History:  Diagnosis Date   Asthma 1990   Hypertension    Obesity, Class III, BMI 40-49.9 (morbid obesity) ([facility])    weight fluctuations of 100 lbs     Past Surgical  History:  Procedure Laterality Date   ABDOMINAL HYSTERECTOMY  2001   BREAST BIOPSY Right    Neg   BREAST EXCISIONAL BIOPSY Right 1990s   multiple hematomas   CHOLECYSTECTOMY  1970s   COLONOSCOPY WITH PROPOFOL N/A 03/30/2018   Procedure: COLONOSCOPY WITH PROPOFOL;  Surgeon: Lucilla Lame, MD;  Location: [facility] ENDOSCOPY;  Service: Endoscopy;  Laterality: N/A;   JOINT REPLACEMENT     Bilateral   STOMACH SURGERY  [phone]   gastric partitionin( for obesity)     Family History  Problem Relation Age of Onset   Heart disease Father    COPD Father    Hyperlipidemia Father    Mental illness Brother    Breast cancer Neg Hx     SOCIAL HX:  reports that she has never smoked. She has never used smokeless tobacco. She reports current alcohol use. She reports that she does not use drugs.    Current Outpatient Medications:    acyclovir (ZOVIRAX) 400 MG tablet, TAKE ONE TABLET BY MOUTH  FIVE TIMES DAILY (Patient taking differently: Take 400 mg by mouth as needed. TAKE ONE TABLET BY MOUTH FIVE TIMES DAILY), Disp: 450 tablet, Rfl: 1   albuterol (PROVENTIL HFA;VENTOLIN HFA) 108 (90 Base) MCG/ACT inhaler, Inhale 2 puffs into the lungs every 6 (six) hours as needed for wheezing or shortness of breath., Disp: 3 Inhaler, Rfl: 1   albuterol (PROVENTIL) (2.5 MG/3ML) 0.083% nebulizer solution, Take 3 mLs (2.5 mg total) by nebulization every 6 (six) hours as needed for wheezing or shortness of  breath., Disp: 150 mL, Rfl: 1   amLODipine (NORVASC) 10 MG tablet, TAKE 1 TABLET BY MOUTH DAILY, Disp: 90 tablet, Rfl: 1   atorvastatin (LIPITOR) 20 MG tablet, TAKE ONE TABLET EVERY DAY, Disp: 90 tablet, Rfl: 1   azithromycin (ZITHROMAX) 250 MG tablet, Take 2 tablets on day 1, then 1 tablet daily on days 2 through 5, Disp: 6 tablet, Rfl: 0   chlorpheniramine-HYDROcodone (TUSSIONEX PENNKINETIC ER) 10-8 MG/5ML SUER, Take 5 mLs by mouth every 12 (twelve) hours as needed for cough., Disp: 140 mL, Rfl: 0   furosemide (LASIX) 20  MG tablet, TAKE ONE TABLET EVERY DAY AS NEEDED FOR FLUID RETENTION, Disp: 90 tablet, Rfl: 1   gabapentin (NEURONTIN) 300 MG capsule, TAKE 2 CAPSULES 3 TIMES DAILY, Disp: 540 capsule, Rfl: 3   losartan (COZAAR) 100 MG tablet, TAKE ONE TABLET EVERY DAY, Disp: 90 tablet, Rfl: 1   metFORMIN (GLUCOPHAGE-XR) 500 MG 24 hr tablet, Take 2 tablets (1,000 mg total) by mouth daily. (Patient taking differently: Take 500 mg by mouth daily.), Disp: 180 tablet, Rfl: 3   montelukast (SINGULAIR) 10 MG tablet, Take 1 tablet (10 mg total) by mouth at bedtime., Disp: 90 tablet, Rfl: 3   omeprazole (PRILOSEC) 20 MG capsule, TAKE 1 CAPSULE TWICE DAILY, Disp: 180 capsule, Rfl: 3   PARoxetine (PAXIL) 20 MG tablet, TAKE ONE TABLET EVERY MORNING, Disp: 90 tablet, Rfl: 3   potassium chloride SA (KLOR-CON) 20 MEQ tablet, TAKE 1 TABLET BY MOUTH  DAILY WHEN USING FUROSEMIDE, Disp: 90 tablet, Rfl: 1   predniSONE (DELTASONE) 10 MG tablet, 6 tablets daily for 3 days, then reduce by 1 tablet daily until gone, Disp: 33 tablet, Rfl: 0   promethazine (PHENERGAN) 25 MG tablet, TAKE 1 TABLET BY MOUTH  EVERY 8 HOURS AS NEEDED FOR NAUSEA AND VOMITING, Disp: 90 tablet, Rfl: 0   Semaglutide, 1 MG/DOSE, (OZEMPIC, 1 MG/DOSE,) 4 MG/3ML SOPN, Inject 1 mg into the skin once a week., Disp: 3 mL, Rfl: 0   triamterene-hydrochlorothiazide (MAXZIDE-25) 37.5-25 MG tablet, TAKE 1 TABLET BY MOUTH DAILY, Disp: 90 tablet, Rfl: 0   zolpidem (AMBIEN) 10 MG tablet, TAKE 1 TABLET BY MOUTH AT BEDTIME AS NEEDED FOR SLEEP., Disp: 30 tablet, Rfl: 5  Current Facility-Administered Medications:    pneumococcal 13-valent conjugate vaccine (PREVNAR 13) injection 0.5 mL, 0.5 mL, Intramuscular, Once, Derrel Nip, Aris Everts, MD  EXAM:  VITALS per patient if applicable:  GENERAL: alert, oriented, appears well and in no acute distress  HEENT: atraumatic, conjunttiva clear, no obvious abnormalities on inspection of external nose and ears  NECK: normal movements of the head  and neck  LUNGS: on inspection no signs of respiratory distress, breathing rate appears normal, no obvious gross SOB, gasping or wheezing  CV: no obvious cyanosis  MS: moves all visible extremities without noticeable abnormality  PSYCH/NEURO: pleasant and cooperative, no obvious depression or anxiety, speech and thought processing grossly intact  ASSESSMENT AND PLAN:  Discussed the following assessment and plan:  No diagnosis found.  No problem-specific Assessment & Plan notes found for this encounter.    I discussed the assessment and treatment plan with the patient. The patient was provided an opportunity to ask questions and all were answered. The patient agreed with the plan and demonstrated an understanding of the instructions.   The patient was advised to call back or seek an in-person evaluation if the symptoms worsen or if the condition fails to improve as anticipated.   I spent 20 minutes  dedicated to the care of this patient on the date of this encounter to include pre-visit review of her medical history,  Face-to-face time with the patient , and post visit ordering of testing and therapeutics.    [name], MD

## 2021-08-29 NOTE — Telephone Encounter (Signed)
Spoke with pt and scheduled her for a virtual visit this morning.

## 2021-08-29 NOTE — Assessment & Plan Note (Signed)
She endorses improved symptoms currently

## 2021-08-29 NOTE — Progress Notes (Signed)
No Show  In chart review appears to have had virtual appt with her PCP this am prior to this appt.  [place]

## 2021-08-29 NOTE — Assessment & Plan Note (Signed)
Supportive care with prednisone prolonged taper,  z pack and tussionex for cough suppression  Advised to retest for COVID and check 02 sats given history of hypoxic respiratory failure

## 2021-08-29 NOTE — Assessment & Plan Note (Signed)
Transient,  With normal PTH and borderline low vitamin D.  Addressed with supplementation

## 2021-09-10 ENCOUNTER — Other Ambulatory Visit: Payer: Self-pay | Admitting: Internal Medicine

## 2021-09-18 DIAGNOSIS — E1165 Type 2 diabetes mellitus with hyperglycemia: Secondary | ICD-10-CM | POA: Diagnosis not present

## 2021-09-19 ENCOUNTER — Telehealth: Payer: Medicare Other

## 2021-09-26 ENCOUNTER — Ambulatory Visit (INDEPENDENT_AMBULATORY_CARE_PROVIDER_SITE_OTHER): Payer: Medicare Other | Admitting: Pharmacist

## 2021-09-26 DIAGNOSIS — Z6836 Body mass index (BMI) 36.0-36.9, adult: Secondary | ICD-10-CM

## 2021-09-26 DIAGNOSIS — E785 Hyperlipidemia, unspecified: Secondary | ICD-10-CM

## 2021-09-26 DIAGNOSIS — E1169 Type 2 diabetes mellitus with other specified complication: Secondary | ICD-10-CM

## 2021-09-26 DIAGNOSIS — E669 Obesity, unspecified: Secondary | ICD-10-CM

## 2021-09-26 DIAGNOSIS — F339 Major depressive disorder, recurrent, unspecified: Secondary | ICD-10-CM

## 2021-09-26 NOTE — Patient Instructions (Signed)
Theta,   Keep up the GREAT work!  We recommend you get the influenza vaccine for this season.   We recommend you get the updated bivalent COVID-19 booster, at least 2 months after any prior doses. You may consider delaying a booster dose by 3 months from a prior episode of COVID-19 per the CDC.   You can find pharmacies that have this formulation in stock at [email or web address].   Take care, [name], PharmD   Visit Information  PATIENT GOALS:  Goals Addressed               This Visit's Progress     Patient Stated     Monitor and Manage My Blood Sugar (pt-stated)        Patient Goals/Self-Care Activities Over the next 90 days, patient will:  - take medications as prescribed check blood glucose periodically, document, and provide at future appointments collaborate with provider on medication access solutions         Patient verbalizes understanding of instructions provided today and agrees to view in [place].   Plan: Telephone follow up appointment with care management team member scheduled for:  6 months  [name], PharmD, [place], [place] Clinical Pharmacist [employer] at [employer] [phone]

## 2021-09-26 NOTE — Chronic Care Management (AMB) (Signed)
Chronic Care Management Pharmacy Note  09/26/2021 Name:  Katie Huber MRN:  [id number] DOB:  Jan 25, 1951  Subjective: Katie Huber is an 70 y.o. year old female who is a primary patient of Tullo, Aris Everts, MD.  The CCM team was consulted for assistance with disease management and care coordination needs.    Engaged with patient by telephone for follow up visit for pharmacy case management and/or care coordination services.   Objective:  Medications Reviewed Today     Reviewed by De Hollingshead, RPH-CPP (Pharmacist) on 09/26/21 at 1433  Med List Status: <None>   Medication Order Taking? Sig Documenting Provider Last Dose Status Informant  acyclovir (ZOVIRAX) 400 MG tablet [id number] No TAKE ONE TABLET BY MOUTH  FIVE TIMES DAILY  Patient not taking: Reported on 09/26/2021   [name], MD Not Taking Active   albuterol (PROVENTIL HFA;VENTOLIN HFA) 108 (90 Base) MCG/ACT inhaler [id number] Yes Inhale 2 puffs into the lungs every 6 (six) hours as needed for wheezing or shortness of breath. Kendell Bane, NP Taking Active   albuterol (PROVENTIL) (2.5 MG/3ML) 0.083% nebulizer solution [id number] Yes Take 3 mLs (2.5 mg total) by nebulization every 6 (six) hours as needed for wheezing or shortness of breath. Kendell Bane, NP Taking Active   amLODipine (NORVASC) 10 MG tablet [id number] Yes TAKE 1 TABLET BY MOUTH DAILY [name], MD Taking Active   atorvastatin (LIPITOR) 20 MG tablet [id number] Yes TAKE ONE TABLET EVERY DAY [name], MD Taking Active   chlorpheniramine-HYDROcodone [facility]) 10-8 MG/5ML Latanya Presser [id number]  Take 5 mLs by mouth every 12 (twelve) hours as needed for cough. [name], MD  Active   furosemide (LASIX) 20 MG tablet [id number] No TAKE ONE TABLET EVERY DAY AS NEEDED FOR FLUID RETENTION  Patient not taking: Reported on 09/26/2021   [name], MD Not Taking Active   gabapentin (NEURONTIN) 300 MG capsule [id number] Yes TAKE 2  CAPSULES 3 TIMES DAILY [name], MD Taking Active   losartan (COZAAR) 100 MG tablet [id number] Yes TAKE ONE TABLET EVERY DAY [name], MD Taking Active   metFORMIN (GLUCOPHAGE-XR) 500 MG 24 hr tablet [id number] Yes Take 2 tablets (1,000 mg total) by mouth daily.  Patient taking differently: Take 500 mg by mouth daily.   [name], MD Taking Active   montelukast (SINGULAIR) 10 MG tablet [id number] Yes Take 1 tablet (10 mg total) by mouth at bedtime. [name], MD Taking Active   omeprazole (PRILOSEC) 20 MG capsule [id number] Yes TAKE 1 CAPSULE TWICE DAILY [name], MD Taking Active   PARoxetine (PAXIL) 20 MG tablet [id number] Yes TAKE ONE TABLET EVERY MORNING [name], MD Taking Active   pneumococcal 13-valent conjugate vaccine (PREVNAR 13) injection 0.5 mL [id number]   [name], MD  Active   potassium chloride SA (KLOR-CON) 20 MEQ tablet [id number] No TAKE 1 TABLET BY MOUTH  DAILY WHEN USING FUROSEMIDE  Patient not taking: Reported on 09/26/2021   [name], MD Not Taking Active   promethazine (PHENERGAN) 25 MG tablet [id number]  TAKE 1 TABLET BY MOUTH  EVERY 8 HOURS AS NEEDED FOR NAUSEA AND VOMITING [name], MD  Active   Semaglutide, 1 MG/DOSE, (OZEMPIC, 1 MG/DOSE,) 4 MG/3ML SOPN [id number] Yes Inject 1 mg into the skin once a week. [name], MD Taking Active   triamterene-hydrochlorothiazide [facility]) 37.5-25 MG tablet [id number] Yes TAKE 1 TABLET BY MOUTH  DAILY [name], MD Taking Active   zolpidem (AMBIEN) 10 MG tablet [id number] Yes TAKE 1 TABLET BY MOUTH AT BEDTIME AS NEEDED FOR SLEEP. [name], MD Taking Active            Lab Results  Component Value Date   HGBA1C 5.2 06/11/2021   Lab Results  Component Value Date   CREATININE 0.57 06/24/2021   BUN 10 06/24/2021   NA 136 06/24/2021   K 4.1 06/24/2021   CL 101 06/24/2021   CO2 28 06/24/2021   Lab Results  Component Value Date   CHOL 153 06/11/2021    HDL 57.10 06/11/2021   LDLCALC 68 06/11/2021   LDLDIRECT 69.0 06/05/2017   TRIG 139.0 06/11/2021   CHOLHDL 3 06/11/2021     Assessment/Interventions: Review of patient past medical history, allergies, medications, health status, including review of consultants reports, laboratory and other test data, was performed as part of comprehensive evaluation and provision of chronic care management services.   SDOH:  (Social Determinants of Health) assessments and interventions performed: Yes SDOH Interventions    Flowsheet Row Most Recent Value  SDOH Interventions   Financial Strain Interventions Other (Comment)  [manufacturer assistance]        CCM Care Plan  Review of patient past medical history, allergies, medications, health status, including review of consultants reports, laboratory and other test data, was performed as part of comprehensive evaluation and provision of chronic care management services.   Conditions to be addressed/monitored:  Hypertension, Hyperlipidemia, and Diabetes  Care Plan : Medication Management  Updates made by De Hollingshead, RPH-CPP since 09/26/2021 12:00 AM     Problem: Diabetes, Weight Management, HTN      Long-Range Goal: Disease Progression Prevented or Minimized   Start Date: 10/10/2020  Recent Progress: On track  Priority: High  Note:   Current Barriers:  Unable to independently afford treatment regimen Unable to independently monitor therapeutic efficacy Unable to achieve control of diabetes   Pharmacist Clinical Goal(s):  Over the next 90 days, patient will verbalize ability to afford treatment regimen. Over the next 90 days, achieve adherence to monitoring guidelines and medication adherence to achieve therapeutic efficacy. Over the next 90 days, achieve control of diabetes as evidenced by improvement in A1c through collaboration with PharmD and provider.   Interventions: 1:1 collaboration with [name], MD regarding  development and update of comprehensive plan of care as evidenced by provider attestation and co-signature Inter-disciplinary care team collaboration (see longitudinal plan of care) Comprehensive medication review performed; medication list updated in electronic medical record  Health Maintenance   Yearly diabetic eye exam: due Yearly diabetic foot exam: up to date Urine microalbumin: up to date Yearly influenza vaccination: due Td/Tdap vaccination: up to date Pneumonia vaccination: up to date COVID vaccinations: due Shingrix vaccinations: up to date Colonoscopy: up to date Bone density scan: up to date Mammogram: up to date  Diabetes: Controlled; current treatment: Ozempic 1 mg weekly; metformin XR 500 mg daily Most recent home weight: 230 lbs; baseline weight: 332 lbs;  Approved for Ozempic assistance through [employer] through 10/30/21. Current meal patterns: improvement in cravings.  Current physical activity: going to the [facility], doing  Recommended to continue current regimen at this time. Discussed eliminating metformin. Patient declines to make changes at this time.  Will collaborate w/ CPhT, patient, and providers to reapply for patient assistance for [place] for 2023.  Hypertension: Controlled per last visit; current treatment: losartan 100 mg QAM,  triamterene/HCTZ 37.5/25 mg QAM, amlodipine 10 mg QPM Home BP readings: has not been checking lately. Will plan to restart.  Denies any concerns with dizziness, lightheadedness, symptoms of hypotension. Would like to see if she can reduce amlodipine moving forward.  Continue current regimen at this time with occasional home monitoring.   Hyperlipidemia: Controlled per last lipid panel; current treatment: atorvastatin 20 mg daily  Recommended to continue current regimen at this time.  Cervicalgia: Appropriately managed; current treatment: gabapentin 600 mg TID Recommend to continue current regimen at this time. Monitor for  sedation. Denies any concerns at this itme  GERD: Some periodic symptoms; current regimen: omeprazole 40 mg BID Previously recommended to continue current regimen at this time  Patient Goals/Self-Care Activities Over the next 90 days, patient will:  - take medications as prescribed check blood glucose periodically, document, and provide at future appointments collaborate with provider on medication access solutions  Follow Up Plan: Telephone follow up appointment with care management team member scheduled for: ~6 months       Plan: Telephone follow up appointment with care management team member scheduled for:  6 months  [name], PharmD, [place], [place] Pharmacist [employer] at [employer] [phone]

## 2021-09-30 DIAGNOSIS — F339 Major depressive disorder, recurrent, unspecified: Secondary | ICD-10-CM

## 2021-09-30 DIAGNOSIS — E669 Obesity, unspecified: Secondary | ICD-10-CM | POA: Diagnosis not present

## 2021-09-30 DIAGNOSIS — E785 Hyperlipidemia, unspecified: Secondary | ICD-10-CM | POA: Diagnosis not present

## 2021-09-30 DIAGNOSIS — E1169 Type 2 diabetes mellitus with other specified complication: Secondary | ICD-10-CM

## 2021-10-09 ENCOUNTER — Other Ambulatory Visit: Payer: Self-pay | Admitting: Internal Medicine

## 2021-10-09 ENCOUNTER — Telehealth: Payer: Self-pay | Admitting: Pharmacy Technician

## 2021-10-09 DIAGNOSIS — Z596 Low income: Secondary | ICD-10-CM

## 2021-10-09 NOTE — Progress Notes (Signed)
[facility])                                            [place] Team    10/09/2021  Katie Huber 1951-04-20 [id number]  FOR 2023 RE ENROLLMENT                                      Medication Assistance Referral  Referral From: [facility] Embedded RPh Catie T.   Medication/Company: [name] / Novo Nordisk Patient application portion:  Education officer, museum portion:  N/A embedded PharmD to have signed in clinic  to Dr. Derrel Nip Provider address/fax verified via: Office website    [name] P. [name], [name]  [phone]

## 2021-10-16 ENCOUNTER — Encounter: Payer: Self-pay | Admitting: Internal Medicine

## 2021-10-16 ENCOUNTER — Other Ambulatory Visit: Payer: Self-pay

## 2021-10-16 ENCOUNTER — Ambulatory Visit (INDEPENDENT_AMBULATORY_CARE_PROVIDER_SITE_OTHER): Payer: Medicare Other | Admitting: Internal Medicine

## 2021-10-16 VITALS — BP 132/72 | HR 74 | Temp 96.2°F | Ht 67.0 in | Wt 234.8 lb

## 2021-10-16 DIAGNOSIS — E785 Hyperlipidemia, unspecified: Secondary | ICD-10-CM

## 2021-10-16 DIAGNOSIS — E669 Obesity, unspecified: Secondary | ICD-10-CM

## 2021-10-16 DIAGNOSIS — F339 Major depressive disorder, recurrent, unspecified: Secondary | ICD-10-CM | POA: Diagnosis not present

## 2021-10-16 DIAGNOSIS — E1169 Type 2 diabetes mellitus with other specified complication: Secondary | ICD-10-CM

## 2021-10-16 DIAGNOSIS — I10 Essential (primary) hypertension: Secondary | ICD-10-CM | POA: Diagnosis not present

## 2021-10-16 NOTE — Patient Instructions (Signed)
I agree with  stopping metformin  Continue 1 mg weekly ozempic unles you start to gain weight    Return for fasting labs  at your leisure

## 2021-10-16 NOTE — Assessment & Plan Note (Signed)
She has lost 100 lbs over the last year and lowered her a1c to 5.2  with ozempic and metformin . Agree with stopping metformin today.  Continue atorvastatin and ARB.  Return for fasting labs

## 2021-10-16 NOTE — Assessment & Plan Note (Addendum)
Well controlled on current regimen of amlodipine and losartan. Renal function stable, no changes today.

## 2021-10-16 NOTE — Assessment & Plan Note (Signed)
Improved symptoms with paxil and counselling.  No chandes todayg

## 2021-10-16 NOTE — Progress Notes (Signed)
Subjective:  Patient ID: Katie Huber, female    DOB: [birth year]  Age: 70 y.o. MRN: [id number]  CC: The primary encounter diagnosis was Type 2 diabetes mellitus with obesity ([place]). Diagnoses of Hyperlipidemia associated with type 2 diabetes mellitus ([place]), Obesity, Class III, BMI 40-49.9 (morbid obesity) ([place]), Major depressive disorder, recurrent episode with anxious distress ([place]), and Essential hypertension, benign were also pertinent to this visit.  HPI Katie Huber presents for  Chief Complaint  Patient presents with   Follow-up    6 month follow up on diabetes, hypertension, hyperlipidemia, depression    This visit occurred during the SARS-CoV-2 public health emergency.  Safety protocols were in place, including screening questions prior to the visit, additional usage of staff PPE, and extensive cleaning of exam room while observing appropriate contact time as indicated for disinfecting solutions.   Obesity diabetes and hypertension:  she has been taking ozempic  since   October 2021 along with  metformin    she has lowered her a1c to 5.2 and has had a  net loss of 100 lbs over the past year .  She is currently taking 1 mg dose.   Depression:  she is feeling much better since starting therapy with Miguel Dibble and dealing with her children differently .  She his finally starting to feel happy     Outpatient Medications Prior to Visit  Medication Sig Dispense Refill   acyclovir (ZOVIRAX) 400 MG tablet TAKE ONE TABLET BY MOUTH  FIVE TIMES DAILY 450 tablet 1   albuterol (PROVENTIL HFA;VENTOLIN HFA) 108 (90 Base) MCG/ACT inhaler Inhale 2 puffs into the lungs every 6 (six) hours as needed for wheezing or shortness of breath. 3 Inhaler 1   albuterol (PROVENTIL) (2.5 MG/3ML) 0.083% nebulizer solution Take 3 mLs (2.5 mg total) by nebulization every 6 (six) hours as needed for wheezing or shortness of breath. 150 mL 1   amLODipine (NORVASC) 10 MG tablet TAKE 1 TABLET BY MOUTH DAILY 90  tablet 1   atorvastatin (LIPITOR) 20 MG tablet TAKE ONE TABLET EVERY DAY 90 tablet 1   furosemide (LASIX) 20 MG tablet TAKE ONE TABLET EVERY DAY AS NEEDED FOR FLUID RETENTION 90 tablet 1   gabapentin (NEURONTIN) 300 MG capsule TAKE 2 CAPSULES 3 TIMES DAILY 540 capsule 3   losartan (COZAAR) 100 MG tablet TAKE ONE TABLET EVERY DAY 90 tablet 1   montelukast (SINGULAIR) 10 MG tablet Take 1 tablet (10 mg total) by mouth at bedtime. 90 tablet 3   omeprazole (PRILOSEC) 20 MG capsule TAKE 1 CAPSULE TWICE DAILY 180 capsule 3   PARoxetine (PAXIL) 20 MG tablet TAKE ONE TABLET EVERY MORNING 90 tablet 3   potassium chloride SA (KLOR-CON) 20 MEQ tablet TAKE 1 TABLET BY MOUTH  DAILY WHEN USING FUROSEMIDE (Patient taking differently: TAKE 1 TABLET BY MOUTH  DAILY WHEN USING FUROSEMIDE) 90 tablet 1   promethazine (PHENERGAN) 25 MG tablet TAKE 1 TABLET BY MOUTH  EVERY 8 HOURS AS NEEDED FOR NAUSEA AND VOMITING 90 tablet 0   triamterene-hydrochlorothiazide (MAXZIDE-25) 37.5-25 MG tablet TAKE 1 TABLET BY MOUTH DAILY 90 tablet 0   zolpidem (AMBIEN) 10 MG tablet TAKE 1 TABLET BY MOUTH AT BEDTIME AS NEEDED FOR SLEEP. 30 tablet 5   metFORMIN (GLUCOPHAGE-XR) 500 MG 24 hr tablet Take 2 tablets (1,000 mg total) by mouth daily. (Patient taking differently: Take 500 mg by mouth daily.) 180 tablet 3   Semaglutide, 1 MG/DOSE, (OZEMPIC, 1 MG/DOSE,) 4 MG/3ML SOPN Inject 1 mg  into the skin once a week. 3 mL 0   chlorpheniramine-HYDROcodone (TUSSIONEX PENNKINETIC ER) 10-8 MG/5ML SUER Take 5 mLs by mouth every 12 (twelve) hours as needed for cough. (Patient not taking: Reported on 10/16/2021) 140 mL 0   Facility-Administered Medications Prior to Visit  Medication Dose Route Frequency Provider Last Rate Last Admin   pneumococcal 13-valent conjugate vaccine (PREVNAR 13) injection 0.5 mL  0.5 mL Intramuscular Once [name], MD        Review of Systems;  Patient denies headache, fevers, malaise, unintentional weight loss, skin  rash, eye pain, sinus congestion and sinus pain, sore throat, dysphagia,  hemoptysis , cough, dyspnea, wheezing, chest pain, palpitations, orthopnea, edema, abdominal pain, nausea, melena, diarrhea, constipation, flank pain, dysuria, hematuria, urinary  Frequency, nocturia, numbness, tingling, seizures,  Focal weakness, Loss of consciousness,  Tremor, insomnia, depression, anxiety, and suicidal ideation.      Objective:  BP 132/72 (BP Location: Left Arm, Patient Position: Sitting, Cuff Size: Large)   Pulse 74   Temp (!) 96.2 F (35.7 C) (Temporal)   Ht '5\' 7"'  (1.702 m)   Wt 234 lb 12.8 oz (106.5 kg)   SpO2 98%   BMI 36.77 kg/m   BP Readings from Last 3 Encounters:  10/16/21 132/72  08/29/21 123/78  07/11/21 (!) 122/58    Wt Readings from Last 3 Encounters:  10/16/21 234 lb 12.8 oz (106.5 kg)  08/29/21 230 lb (104.3 kg)  07/11/21 250 lb 6.4 oz (113.6 kg)    General appearance: alert, cooperative and appears stated age Ears: normal TM's and external ear canals both ears Throat: lips, mucosa, and tongue normal; teeth and gums normal Neck: no adenopathy, no carotid bruit, supple, symmetrical, trachea midline and thyroid not enlarged, symmetric, no tenderness/mass/nodules Back: symmetric, no curvature. ROM normal. No CVA tenderness. Lungs: clear to auscultation bilaterally Heart: regular rate and rhythm, S1, S2 normal, no murmur, click, rub or gallop Abdomen: soft, non-tender; bowel sounds normal; no masses,  no organomegaly Pulses: 2+ and symmetric Skin: Skin color, texture, turgor normal. No rashes or lesions Lymph nodes: Cervical, supraclavicular, and axillary nodes normal.  Lab Results  Component Value Date   HGBA1C 5.2 06/11/2021   HGBA1C 5.5 12/12/2020   HGBA1C 6.6 (H) 08/29/2020    Lab Results  Component Value Date   CREATININE 0.57 06/24/2021   CREATININE 0.69 06/11/2021   CREATININE 0.95 11/07/2020    Lab Results  Component Value Date   WBC 7.6 03/19/2020    HGB 14.2 03/19/2020   HCT 42.3 03/19/2020   PLT 218 03/19/2020   GLUCOSE 96 06/24/2021   CHOL 153 06/11/2021   TRIG 139.0 06/11/2021   HDL 57.10 06/11/2021   LDLDIRECT 69.0 06/05/2017   LDLCALC 68 06/11/2021   ALT 17 06/11/2021   AST 18 06/11/2021   NA 136 06/24/2021   K 4.1 06/24/2021   CL 101 06/24/2021   CREATININE 0.57 06/24/2021   BUN 10 06/24/2021   CO2 28 06/24/2021   TSH 1.08 06/08/2014   HGBA1C 5.2 06/11/2021   MICROALBUR <0.7 09/03/2020    MM 3D SCREEN BREAST BILATERAL  Result Date: 01/10/2021 CLINICAL DATA:  Screening. EXAM: DIGITAL SCREENING BILATERAL MAMMOGRAM WITH TOMOSYNTHESIS AND CAD TECHNIQUE: Bilateral screening digital craniocaudal and mediolateral oblique mammograms were obtained. Bilateral screening digital breast tomosynthesis was performed. The images were evaluated with computer-aided detection. COMPARISON:  Previous exam(s). ACR Breast Density Category b: There are scattered areas of fibroglandular density. FINDINGS: There are no findings suspicious for  malignancy. IMPRESSION: No mammographic evidence of malignancy. A result letter of this screening mammogram will be mailed directly to the patient. RECOMMENDATION: Screening mammogram in one year. (Code:SM-B-01Y) BI-RADS CATEGORY  1: Negative. Electronically Signed   By: Fidela Salisbury M.D.   On: 01/10/2021 16:53    Assessment & Plan:   Problem List Items Addressed This Visit     Hyperlipidemia associated with type 2 diabetes mellitus ([place])   Relevant Orders   Lipid panel   Type 2 diabetes mellitus with obesity ([place]) - Primary    She has lost 100 lbs over the last year and lowered her a1c to 5.2  with ozempic and metformin . Agree with stopping metformin today.  Continue atorvastatin and ARB.  Return for fasting labs       Relevant Orders   HgB A1c   Comp Met (CMET)   Microalbumin / creatinine urine ratio   Essential hypertension, benign    Well controlled on current regimen of amlodipine and  losartan. Renal function stable, no changes today.      Major depressive disorder, recurrent episode with anxious distress ([place])    Improved symptoms with paxil and counselling.  No chandes todayg      RESOLVED: Obesity, Class III, BMI 40-49.9 (morbid obesity) ([place])    With a weight loss of 100 lbs over the past year she has dropped a1c to 5.2  Agree with stopping metformin   Lab Results  Component Value Date   HGBA1C 5.2 06/11/2021   Lab Results  Component Value Date   MICROALBUR <0.7 09/03/2020   MICROALBUR <0.7 01/28/2019            I have discontinued Analyah Line's metFORMIN, Ozempic (1 MG/DOSE), and chlorpheniramine-HYDROcodone. I am also having her maintain her albuterol, albuterol, acyclovir, furosemide, promethazine, potassium chloride SA, gabapentin, PARoxetine, omeprazole, montelukast, losartan, atorvastatin, zolpidem, amLODipine, and triamterene-hydrochlorothiazide. We will continue to administer pneumococcal 13-valent conjugate vaccine.  No orders of the defined types were placed in this encounter.   Medications Discontinued During This Encounter  Medication Reason   chlorpheniramine-HYDROcodone (TUSSIONEX PENNKINETIC ER) 10-8 MG/5ML SUER    Semaglutide, 1 MG/DOSE, (OZEMPIC, 1 MG/DOSE,) 4 MG/3ML SOPN    metFORMIN (GLUCOPHAGE-XR) 500 MG 24 hr tablet     Follow-up: Return in about 6 months (around 04/15/2022) for follow up diabetes.   [name], MD

## 2021-10-16 NOTE — Assessment & Plan Note (Signed)
With a weight loss of 100 lbs over the past year she has dropped a1c to 5.2  Agree with stopping metformin   Lab Results  Component Value Date   HGBA1C 5.2 06/11/2021   Lab Results  Component Value Date   MICROALBUR <0.7 09/03/2020   MICROALBUR <0.7 01/28/2019

## 2021-10-19 DIAGNOSIS — E1165 Type 2 diabetes mellitus with hyperglycemia: Secondary | ICD-10-CM | POA: Diagnosis not present

## 2021-10-22 ENCOUNTER — Telehealth: Payer: Self-pay | Admitting: Pharmacy Technician

## 2021-10-22 DIAGNOSIS — Z596 Low income: Secondary | ICD-10-CM

## 2021-10-22 NOTE — Progress Notes (Addendum)
[facility])                                            [place] Team   12/04/2021  Had to refaxed provider's part today as date was missing beside provider signature. Was given an ok by Embedded PharmD to hand write in the date of 10/17/21.  Ai Sonnenfeld P. Charo Philipp, [place]  [phone]  10/22/2021  [place] Athanas 06/22/1951 [id number]  Received both patient and provider portion(s) of patient assistance application(s) for Ozempic. Faxed completed application and required documents into [employer].   Joesiah Lonon P. Marques Ericson, [place]  [phone]

## 2021-10-31 ENCOUNTER — Other Ambulatory Visit: Payer: Self-pay

## 2021-10-31 ENCOUNTER — Other Ambulatory Visit (INDEPENDENT_AMBULATORY_CARE_PROVIDER_SITE_OTHER): Payer: Medicare Other

## 2021-10-31 DIAGNOSIS — E1169 Type 2 diabetes mellitus with other specified complication: Secondary | ICD-10-CM | POA: Diagnosis not present

## 2021-10-31 DIAGNOSIS — E669 Obesity, unspecified: Secondary | ICD-10-CM | POA: Diagnosis not present

## 2021-10-31 DIAGNOSIS — E785 Hyperlipidemia, unspecified: Secondary | ICD-10-CM | POA: Diagnosis not present

## 2021-10-31 LAB — LIPID PANEL
Cholesterol: 143 mg/dL (ref 0–200)
HDL: 51.2 mg/dL (ref >39.00)
LDL Cholesterol: 77 mg/dL (ref 0–99)
NonHDL: 92.14
Total CHOL/HDL Ratio: 3
Triglycerides: 76 mg/dL (ref 0.0–149.0)
VLDL: 15.2 mg/dL (ref 0.0–40.0)

## 2021-10-31 LAB — COMPREHENSIVE METABOLIC PANEL
ALT: 11 U/L (ref 0–35)
AST: 16 U/L (ref 0–37)
Albumin: 4.3 g/dL (ref 3.5–5.2)
Alkaline Phosphatase: 59 U/L (ref 39–117)
BUN: 14 mg/dL (ref 6–23)
CO2: 26 mEq/L (ref 19–32)
Calcium: 10.2 mg/dL (ref 8.4–10.5)
Chloride: 101 mEq/L (ref 96–112)
Creatinine, Ser: 0.69 mg/dL (ref 0.40–1.20)
GFR: 87.91 mL/min (ref >60.00)
Glucose, Bld: 109 mg/dL — ABNORMAL HIGH (ref 70–99)
Potassium: 3.4 mEq/L — ABNORMAL LOW (ref 3.5–5.1)
Sodium: 135 mEq/L (ref 135–145)
Total Bilirubin: 0.6 mg/dL (ref 0.2–1.2)
Total Protein: 6.6 g/dL (ref 6.0–8.3)

## 2021-10-31 LAB — HEMOGLOBIN A1C: Hgb A1c MFr Bld: 5.3 % (ref 4.6–6.5)

## 2021-11-02 NOTE — Progress Notes (Signed)
Your diabetes remains under excellent control  And your other labs are also normal  except for slightly low potassium  Please increase your potassium to daily for 5 days,  then resume dosing with furosemide only.  continue your current medications. Return at 6 months for follow up on diabetes and make sure you are seeing your eye doctor at least once a year for a dilated retina exam to monitor for diabetic retinopathy,. changes that can lead to blindness .   Regards,   Deborra Medina, MD

## 2021-11-04 ENCOUNTER — Telehealth: Payer: Self-pay | Admitting: Internal Medicine

## 2021-11-04 DIAGNOSIS — M25519 Pain in unspecified shoulder: Secondary | ICD-10-CM

## 2021-11-04 DIAGNOSIS — G8929 Other chronic pain: Secondary | ICD-10-CM

## 2021-11-04 MED ORDER — TIZANIDINE HCL 4 MG PO TABS: 4.0000 mg | ORAL_TABLET | Freq: Four times a day (QID) | ORAL | 0 refills | Status: DC | PRN

## 2021-11-04 NOTE — Telephone Encounter (Signed)
Patient has been informed.

## 2021-11-04 NOTE — Telephone Encounter (Signed)
Tizanidine ,  a muscle relaxer, [facility],  and referral for PT for shoulder/upper back  has been made

## 2021-11-04 NOTE — Telephone Encounter (Signed)
The patient called stating she saw Dr. Derrel Nip on 10/16/21 for her muscle issue. She stated the provider told her what to do and she did as instructed . However, its getting worse. The muscle is causing spasms for hours. The spasms are getting worse and she has to stay at home in the bed. And it is moving to the top of her shoulder into her neck.  She does not want anything for pain. She states she may need physical therapy.

## 2021-11-12 ENCOUNTER — Encounter: Payer: Self-pay | Admitting: Physical Therapy

## 2021-11-12 ENCOUNTER — Ambulatory Visit: Payer: Medicare Other | Attending: Internal Medicine | Admitting: Physical Therapy

## 2021-11-12 DIAGNOSIS — M25611 Stiffness of right shoulder, not elsewhere classified: Secondary | ICD-10-CM | POA: Diagnosis not present

## 2021-11-12 DIAGNOSIS — M25519 Pain in unspecified shoulder: Secondary | ICD-10-CM | POA: Diagnosis not present

## 2021-11-12 DIAGNOSIS — M546 Pain in thoracic spine: Secondary | ICD-10-CM | POA: Insufficient documentation

## 2021-11-12 DIAGNOSIS — G8929 Other chronic pain: Secondary | ICD-10-CM | POA: Insufficient documentation

## 2021-11-12 NOTE — Therapy (Signed)
[facility] [address], [place], [address] Phone: [phone]   Fax:  [phone]  Physical Therapy Treatment  Patient Details  Name: Katie Huber MRN: [id number] Date of Birth: 07-Aug-1951 No data recorded  Encounter Date: 11/12/2021      PT End of Session - 11/12/21 1122     Visit Number 1    Number of Visits 17    Date for PT Re-Evaluation 01/10/22    Authorization - Visit Number 1    Authorization - Number of Visits 10    PT Start Time 0900    PT Stop Time 0945    PT Time Calculation (min) 45 min    Activity Tolerance Patient tolerated treatment well    Behavior During Therapy [facility] for tasks assessed/performed             Past Medical History:  Diagnosis Date   Asthma 1990   Hypertension    Obesity, Class III, BMI 40-49.9 (morbid obesity) ([place])    weight fluctuations of 100 lbs     Past Surgical History:  Procedure Laterality Date   ABDOMINAL HYSTERECTOMY  2001   BREAST BIOPSY Right    Neg   BREAST EXCISIONAL BIOPSY Right 1990s   multiple hematomas   CHOLECYSTECTOMY  1970s   COLONOSCOPY WITH PROPOFOL N/A 03/30/2018   Procedure: COLONOSCOPY WITH PROPOFOL;  Surgeon: Lucilla Lame, MD;  Location: [facility] ENDOSCOPY;  Service: Endoscopy;  Laterality: N/A;   JOINT REPLACEMENT     Bilateral   STOMACH SURGERY  [phone]   gastric partitionin( for obesity)     There were no vitals filed for this visit.   Subjective Assessment - 11/12/21 0905     Pertinent History Pt is a 70year old female presenting with neck/upper back pain that is chronic in nature "R side of my scapula" about a year. Reports about 2 months ago her pain started to get worse without specific incident. She reports her PCP gave her some stretches to do for this pain, and then she started having spasms with these that were severe, and she started flexeril. She still takes flexeril occassionally and it does help with her pain. She  has seen a medical masseuse while waiting for PT appt which helped. 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 Patient is retired, enjoys socializing with friends and family, and reports she has had to leave events. Current pain and resting pain 4/10; and 10/10 with spasms that last only a little while. Patient reports aggravating pain is a cramp/spasm, and constant pain is a dull ache. Aggravating factors are standing, pushing/pulling, lifting, wrapping presents, cooking. Easing: rest, flexeril, ice/heat combo, massage. Patient lives alone, is able to complete all ADLs with modifications needed occassionally. Pt is R handed. Pt denies N/V, B&B changes, unexplained weight fluctuation, saddle paresthesia, fever, night sweats, or unrelenting night pain at this time.    Limitations Lifting;Standing;Sitting    How long can you sit comfortably? without support ~1hour; with support unlimited    How long can you stand comfortably? ~ 1 hour    How long can you walk comfortably? unlimited    Patient Stated Goals decrease pain to return to social activities    Currently in Pain? Yes    Pain Score 4     Pain Location Scapula    Pain Orientation Right    Pain Descriptors / Indicators Aching;Dull;Spasm    Pain Type Chronic  pain    Pain Radiating Towards none    Pain Onset More than a month ago    Pain Frequency Constant    Aggravating Factors  pushing/puling, lifting, standing, sitting without support    Pain Relieving Factors ice/heat, flexeril, rest, massage    Effect of Pain on Daily Activities unable to complete social events                  OBJECTIVE SENSATION: Grossly intact to light touch bilateral UE as determined by testing dermatomes C2-T2 Proprioception and hot/cold testing deferred on this date   MUSCULOSKELETAL: Tremor: None Bulk: Normal Tone: Normal  Posture  Increased thoracic kyphosis, forward head, rounded shoulders  Palpation  TTP  with trigger points and concordant pain to R distal rhomboid major along medial scapular border insertion/mid trap fibers Latent trigger points to R levator scapula and rhomboid minor/mid trap fibers and to bilat UT  Strength R/L 5/5 Shoulder flexion (anterior deltoid/pec major/coracobrachialis, axillary n. (C5/6) and musculocutaneous n. (C5-7)) 5/5 Shoulder abduction (deltoid/supraspinatus, axillary/suprascapular n, C5) 5/5 Shoulder external rotation (infraspinatus/teres minor) 5/5 Shoulder internal rotation (subcapularis/lats/pec major) 5/5 Shoulder extension (posterior deltoid, lats, teres major, axillary/thoracodorsal n.) 5/5 UT Shoulder shrug 3/3 lower trap Y position 3+/3+ scapular retractors  Cervical isometrics are strong in all directions;  AROM R/L All cervical, scapular, and bilat shoulder motions Thoracic flexion and lateral flex WNL  Bilat thoracolumbar rotation 10% limited  Thoracic ext limited 50%  *Indicates pain, overpressure performed unless otherwise indicated  PROM R/L Thoracolumbar rotation WNL with min tension/stiffness at bilat end range rotation All other PROM = AROM *Indicates pain, overpressure performed unless otherwise indicated  Repeated Movements No centralization or peripheralization of symptoms with repeated cervical protraction and retraction or thoracic motions   Muscle Length Upper Trap: wnl Levator: wnl   Passive Accessory Intervertebral Motion (PAIVM) Pt denies reproduction of neck pain with CPA C2-T12 and UPA bilaterally C2-T12. Generally thoracic hypomobility throughout  Passive Physiological Intervertebral Motion (PPIVM) Normal flexion and extension with PPIVM testing   SPECIAL TESTS Spurlings A (ipsilateral lateral flexion/axial compression): R: Negative L: Negative Spurlings B (ipsilateral lateral flexion/contralateral rotation/axial compression): R: Negative L: Negative Distraction Test: Negative  Hoffman Sign (cervical cord  compression): R: Negative L: Negative  Ther-Ex PT reviewed the following HEP with patient with patient able to demonstrate a set of the following with min cuing for correction needed. PT educated patient on parameters of therex (how/when to inc/decrease intensity, frequency, rep/set range, stretch hold time, and purpose of therex) with verbalized understanding.  Access Code: [facility] Seated Scapular Retraction - 8 x daily - 7 x weekly - 12-20 reps - 2-3sec hold Seated Thoracic Lumbar Extension with Pectoralis Stretch - 5-8 x daily - 7 x weekly - 12-20 reps - 7-5IEP hold Heat application [id number] [id number]/[id number]                                        PT Education - 11/12/21 0916     Education Details Patient was educated on diagnosis, anatomy and pathology involved, prognosis, role of PT, and was given an HEP, demonstrating exercise with proper form following verbal and tactile cues, and was given a paper hand out to continue exercise at home. Pt was educated on and agreed to plan of care.    Person(s) Educated Patient    Methods Explanation;Demonstration;Tactile cues;Verbal cues    Comprehension Verbal  cues required;Returned demonstration;Verbalized understanding;Tactile cues required              PT Short Term Goals - 11/12/21 1154       PT SHORT TERM GOAL #1   Title Pt will be independent with HEP in order to improve strength and decrease back pain in order to improve pain-free function at home and in the community    Baseline 11/12/21 HEP given    Time 4    Period Weeks    Status New               PT Long Term Goals - 11/12/21 1155       PT LONG TERM GOAL #1   Title Pt will increase periscapular strength to at least 4/5 MMT grade in order to demonstrate improvement in strength and function needed for heavy household tasks    Baseline 11/12/21 3/5 bilat Lower trap Y position; 3+/5 Scapular retraction T position    Time 8    Period Weeks     Status New      PT LONG TERM GOAL #2   Title Pt will decrease scapular pain as reported on NPRS by at least 2 points in order to demonstrate clinically significant reduction in back pain.    Baseline 11/12/21 10/10    Time 8    Period Weeks    Status New      PT LONG TERM GOAL #3   Title Patient will increase FOTO score to 69 to demonstrate predicted increase in functional mobility to complete ADLs    Baseline 11/12/21 48    Time 8    Period Weeks    Status New                   Plan - 11/12/21 1200     Clinical Impression Statement Pt is a 70 year old female presenting with R scapular pain that has been worsening over the past several months. Impairments in decreased thoracic mobility, decreased periscapular and thoracic extensor strength, increased muscle tension with trigger points of periscapular musculature R>L, increased tension of bilat pec minor, abnormal posutre, decreased scapulohumeral rhythm, and pain. Activity limitations in pushing, pulling, lifting, standing, and sitting without support; inhibiting full participation in ADLs and community activity. Pt will benfit from skiled PT to address aforementioned impairments to return to optimal PLOF.    Personal Factors and Comorbidities Age;Comorbidity 2;Fitness;Past/Current Experience;Time since onset of injury/illness/exacerbation    Comorbidities HTN, obesity    Examination-Activity Limitations Lift;Carry;Sit;Stand;Reach Overhead    Examination-Participation Restrictions Driving;Meal Prep;Community Activity;Cleaning;Laundry;Interpersonal Relationship    Stability/Clinical Decision Making Evolving/Moderate complexity    Clinical Decision Making Moderate    Rehab Potential Good    PT Frequency 2x / week    PT Duration 8 weeks    PT Treatment/Interventions Biofeedback;Electrical Stimulation;Traction;Ultrasound;Gait training;DME Instruction;Moist Heat;Cryotherapy;ADLs/Self Care Home Management;Iontophoresis 4mg /ml  Dexamethasone;Functional mobility training;Therapeutic exercise;Neuromuscular re-education;Patient/family education;Spinal Manipulations;Joint Manipulations;Dry needling;Manual techniques;Therapeutic activities    PT Next Visit Plan HEP review, increase thoracic ext with efficient pushing/pulling strengthening    PT Home Exercise Plan scapular retractions, thoracic ext    Consulted and Agree with Plan of Care Patient             Patient will benefit from skilled therapeutic intervention in order to improve the following deficits and impairments:  Decreased activity tolerance, Decreased endurance, Decreased range of motion, Decreased strength, Increased fascial restricitons, Impaired UE functional use, Improper body mechanics, Pain, Obesity, Postural dysfunction, Impaired tone,  Impaired flexibility, Increased muscle spasms, Decreased coordination, Decreased mobility  Visit Diagnosis: Pain in thoracic spine  Stiffness of right shoulder, not elsewhere classified     Problem List Patient Active Problem List   Diagnosis Date Noted   Hypercalcemia 06/17/2021   Major depressive disorder, recurrent episode with anxious distress ([place]) 06/11/2021   Onychomycosis of right great toe 03/04/2021   Coronary artery calcification of native artery 11/10/2020   Abdominal aortic atherosclerosis ([place]) 11/10/2020   Oral cavity pain 01/09/2020   Vitamin D deficiency 01/09/2020   Diarrhea, functional 07/26/2018   Tubular adenoma of colon 04/01/2018   Benign neoplasm of cecum    Encounter for screening colonoscopy    Rectal polyp    B12 deficiency 01/26/2018   Chronic respiratory failure with hypoxia ([place]) 12/11/2017   Hospital discharge follow-up 03/14/2017   Mild intermittent asthma 03/04/2017   Herpes simplex infection 08/16/2015   Skin neoplasm 08/16/2015   Pulmonary hypertension ([place]) 02/06/2015   S/P vaginal hysterectomy 06/13/2014   Encounter for preventive health examination 06/13/2014    Essential hypertension, benign 12/27/2013   S/P bariatric surgery 09/20/2013   Insomnia due to anxiety and fear 06/14/2013   Type 2 diabetes mellitus with obesity ([place]) 03/02/2013   Hyperlipidemia associated with type 2 diabetes mellitus ([place]) 12/17/2011   Encounter for long-term (current) use of other medications 12/17/2011   Cervicalgia 12/16/2011   Sleep apnea 12/16/2011   Durwin Reges DPT Durwin Reges, PT 11/12/2021, 12:10 PM  [facility] [address] [place], [address] Phone: [phone]   Fax:  [phone]  Name: Katie Huber MRN: [id number] Date of Birth: August 05, 1951

## 2021-11-14 ENCOUNTER — Encounter: Payer: Self-pay | Admitting: Physical Therapy

## 2021-11-14 ENCOUNTER — Ambulatory Visit: Payer: Medicare Other | Admitting: Physical Therapy

## 2021-11-14 DIAGNOSIS — G8929 Other chronic pain: Secondary | ICD-10-CM | POA: Diagnosis not present

## 2021-11-14 DIAGNOSIS — M25519 Pain in unspecified shoulder: Secondary | ICD-10-CM | POA: Diagnosis not present

## 2021-11-14 DIAGNOSIS — M546 Pain in thoracic spine: Secondary | ICD-10-CM | POA: Diagnosis not present

## 2021-11-14 DIAGNOSIS — M25611 Stiffness of right shoulder, not elsewhere classified: Secondary | ICD-10-CM

## 2021-11-14 NOTE — Therapy (Signed)
[facility] [address], [place], [address] Phone: [phone]   Fax:  [phone]  Physical Therapy Treatment  Patient Details  Name: Katie Huber MRN: [id number] Date of Birth: [birth year] Referring Provider (PT): Derrel Nip MD   Encounter Date: 11/14/2021   PT End of Session - 11/14/21 1123     Visit Number 2    Number of Visits 17    Date for PT Re-Evaluation 01/10/22    Authorization - Visit Number 2    Authorization - Number of Visits 10    PT Start Time 1116    PT Stop Time 1155    PT Time Calculation (min) 39 min    Activity Tolerance Patient tolerated treatment well    Behavior During Therapy [facility] for tasks assessed/performed             Past Medical History:  Diagnosis Date   Asthma 1990   Hypertension    Obesity, Class III, BMI 40-49.9 (morbid obesity) ([place])    weight fluctuations of 100 lbs     Past Surgical History:  Procedure Laterality Date   ABDOMINAL HYSTERECTOMY  2001   BREAST BIOPSY Right    Neg   BREAST EXCISIONAL BIOPSY Right 1990s   multiple hematomas   CHOLECYSTECTOMY  1970s   COLONOSCOPY WITH PROPOFOL N/A 03/30/2018   Procedure: COLONOSCOPY WITH PROPOFOL;  Surgeon: Lucilla Lame, MD;  Location: [facility] ENDOSCOPY;  Service: Endoscopy;  Laterality: N/A;   JOINT REPLACEMENT     Bilateral   STOMACH SURGERY  [phone]   gastric partitionin( for obesity)     There were no vitals filed for this visit.   Subjective Assessment - 11/14/21 1120     Subjective Pt reports yesterday she started having shoulder spasms at a Christmas party and she had to leave. She has tried heat, self massage, and flexeril and her rhomboid still is cramping and not feeling good, reporting 8/10 pain. She is very frustrated today by her pain.    Pertinent History Pt is a 70year old female presenting with neck/upper back pain that is chronic in nature "R side of my scapula" about a year. Reports about 2  months ago her pain started to get worse without specific incident. She reports her PCP gave her some stretches to do for this pain, and then she started having spasms with these that were severe, and she started flexeril. She still takes flexeril occassionally and it does help with her pain. She has seen a medical masseuse while waiting for PT appt which helped. 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 Patient is retired, enjoys socializing with friends and family, and reports she has had to leave events. Current pain and resting pain 4/10; and 10/10 with spasms that last only a little while. Patient reports aggravating pain is a cramp/spasm, and constant pain is a dull ache. Aggravating factors are standing, pushing/pulling, lifting, wrapping presents, cooking. Easing: rest, flexeril, ice/heat combo, massage. Patient lives alone, is able to complete all ADLs with modifications needed occassionally. Pt is R handed. Pt denies N/V, B&B changes, unexplained weight fluctuation, saddle paresthesia, fever, night sweats, or unrelenting night pain at this time.    Limitations Lifting;Standing;Sitting    How long can you sit comfortably? without support ~1hour; with support unlimited    How long can you stand comfortably? ~ 1 hour    How long can you walk comfortably? unlimited  Patient Stated Goals decrease pain to return to social activities    Pain Onset More than a month ago                 Ther-Ex Nustep seat 9 UE 6 L1 for gentle motion only Theraball forward rolls x12 with cuing for breath control with good carry over Post shoulder rolls x20 with good carry over following demo of technique Scapular retractions x12 HEP review with min cuing to prevent excessive movement  Manual STM with trigger point release to mid trap fibers/rhomboid major, and thoracic paraspinals with patient reporting "spasm" sensation, not palpated, but noted trigger points and  tension to these areas Following: Dry Needling: (2/2) 38mm .25 needles placed along the R levator scapulae with pincer grasp; and mid trap/rhomoid major trigger points with shleving technique, to decrease increased muscular spasms and trigger points with the patient positioned in prone. Patient was educated on risks and benefits of therapy and verbally consents to PT.                         PT Education - 11/14/21 1123     Education Details therex form/technique, TDN, HEP review    Person(s) Educated Patient    Methods Explanation;Demonstration;Verbal cues    Comprehension Verbalized understanding;Returned demonstration;Verbal cues required              PT Short Term Goals - 11/12/21 1154       PT SHORT TERM GOAL #1   Title Pt will be independent with HEP in order to improve strength and decrease back pain in order to improve pain-free function at home and in the community    Baseline 11/12/21 HEP given    Time 4    Period Weeks    Status New               PT Long Term Goals - 11/12/21 1155       PT LONG TERM GOAL #1   Title Pt will increase periscapular strength to at least 4/5 MMT grade in order to demonstrate improvement in strength and function needed for heavy household tasks    Baseline 11/12/21 3/5 bilat Lower trap Y position; 3+/5 Scapular retraction T position    Time 8    Period Weeks    Status New      PT LONG TERM GOAL #2   Title Pt will decrease scapular pain as reported on NPRS by at least 2 points in order to demonstrate clinically significant reduction in back pain.    Baseline 11/12/21 10/10    Time 8    Period Weeks    Status New      PT LONG TERM GOAL #3   Title Patient will increase FOTO score to 69 to demonstrate predicted increase in functional mobility to complete ADLs    Baseline 11/12/21 48    Time 8    Period Weeks    Status New                   Plan - 11/14/21 1156     Clinical Impression Statement  PT utilized manual with TDN to reduce muscle tension and trigger points to concordant pain sites with localized twitch response with TDN, palpable less tension following manual + TDN techniques. PT utilized therex for increased mobility in new tissue lengthening with patient able to comply with all cuing for proper technique of therex, most cuing needed for  breath control throughout therex. PT educated patient on the importance of breath control, preventing "anticipation anxiety" of pain, and sleep quality on pain response with understanding. PT will ocntinue progression as asble.    Personal Factors and Comorbidities Age;Comorbidity 2;Fitness;Past/Current Experience;Time since onset of injury/illness/exacerbation    Comorbidities HTN, obesity    Examination-Activity Limitations Lift;Carry;Sit;Stand;Reach Overhead    Examination-Participation Restrictions Driving;Meal Prep;Community Activity;Cleaning;Laundry;Interpersonal Relationship    Stability/Clinical Decision Making Evolving/Moderate complexity    Clinical Decision Making Moderate    Rehab Potential Good    PT Frequency 2x / week    PT Duration 8 weeks    PT Treatment/Interventions Biofeedback;Electrical Stimulation;Traction;Ultrasound;Gait training;DME Instruction;Moist Heat;Cryotherapy;ADLs/Self Care Home Management;Iontophoresis 4mg /ml Dexamethasone;Functional mobility training;Therapeutic exercise;Neuromuscular re-education;Patient/family education;Spinal Manipulations;Joint Manipulations;Dry needling;Manual techniques;Therapeutic activities    PT Next Visit Plan HEP review, increase thoracic ext with efficient pushing/pulling strengthening    PT Home Exercise Plan scapular retractions, thoracic ext    Consulted and Agree with Plan of Care Patient             Patient will benefit from skilled therapeutic intervention in order to improve the following deficits and impairments:  Decreased activity tolerance, Decreased endurance,  Decreased range of motion, Decreased strength, Increased fascial restricitons, Impaired UE functional use, Improper body mechanics, Pain, Obesity, Postural dysfunction, Impaired tone, Impaired flexibility, Increased muscle spasms, Decreased coordination, Decreased mobility  Visit Diagnosis: Pain in thoracic spine  Stiffness of right shoulder, not elsewhere classified     Problem List Patient Active Problem List   Diagnosis Date Noted   Hypercalcemia 06/17/2021   Major depressive disorder, recurrent episode with anxious distress ([place]) 06/11/2021   Onychomycosis of right great toe 03/04/2021   Coronary artery calcification of native artery 11/10/2020   Abdominal aortic atherosclerosis ([place]) 11/10/2020   Oral cavity pain 01/09/2020   Vitamin D deficiency 01/09/2020   Diarrhea, functional 07/26/2018   Tubular adenoma of colon 04/01/2018   Benign neoplasm of cecum    Encounter for screening colonoscopy    Rectal polyp    B12 deficiency 01/26/2018   Chronic respiratory failure with hypoxia ([place]) 12/11/2017   Hospital discharge follow-up 03/14/2017   Mild intermittent asthma 03/04/2017   Herpes simplex infection 08/16/2015   Skin neoplasm 08/16/2015   Pulmonary hypertension ([place]) 02/06/2015   S/P vaginal hysterectomy 06/13/2014   Encounter for preventive health examination 06/13/2014   Essential hypertension, benign 12/27/2013   S/P bariatric surgery 09/20/2013   Insomnia due to anxiety and fear 06/14/2013   Type 2 diabetes mellitus with obesity ([place]) 03/02/2013   Hyperlipidemia associated with type 2 diabetes mellitus ([place]) 12/17/2011   Encounter for long-term (current) use of other medications 12/17/2011   Cervicalgia 12/16/2011   Sleep apnea 12/16/2011   Durwin Reges DPT Durwin Reges, PT 11/14/2021, 12:00 PM  [facility] [address] [place], [address] Phone: [phone]   Fax:   [phone]  Name: Katie Huber MRN: [id number] Date of Birth: 12-18-1950

## 2021-11-15 NOTE — Telephone Encounter (Signed)
Spoke with pt and she stated that she has been going to PT and taking the cyclobenzaprine also had drying needling yesterday but it does not seem to be helping. Pt stated that she has now started having spasms in her right shoulder under her shoulder blade. Pt is wanting to know if she should increase the flexeril or the gabapentin? No appts available pt was advised to be seen at [facility] walk in, she stated she would be glad to do that if Dr. Derrel Nip said that's what she needed.

## 2021-11-15 NOTE — Telephone Encounter (Signed)
I spoke with patient & she stated that she would try going back to [facility].

## 2021-11-15 NOTE — Telephone Encounter (Signed)
I recommend that she return to [facility] because I have not seen her for this and I do not know what could be going on

## 2021-11-15 NOTE — Telephone Encounter (Signed)
Pt called in regards to arm pain she has been experiencing. Pt states at PT yesterday 12/15 she was stuck with needles and she is feeling even worse than before. Pt states she is extremely uncomfortable and is unable to sleep. Pt states she only got about 2-4 hours of sleep last night then had to sit up because the pain was unbearable. Pt is wondering if her medication should be increased or switched to something else. Due to limited appt availability pt was sent to access nurse.

## 2021-11-19 ENCOUNTER — Ambulatory Visit: Payer: Medicare Other | Admitting: Physical Therapy

## 2021-11-19 ENCOUNTER — Encounter: Payer: Self-pay | Admitting: Physical Therapy

## 2021-11-19 DIAGNOSIS — M25611 Stiffness of right shoulder, not elsewhere classified: Secondary | ICD-10-CM | POA: Diagnosis not present

## 2021-11-19 DIAGNOSIS — M546 Pain in thoracic spine: Secondary | ICD-10-CM

## 2021-11-19 DIAGNOSIS — E1165 Type 2 diabetes mellitus with hyperglycemia: Secondary | ICD-10-CM | POA: Diagnosis not present

## 2021-11-19 DIAGNOSIS — M25519 Pain in unspecified shoulder: Secondary | ICD-10-CM | POA: Diagnosis not present

## 2021-11-19 DIAGNOSIS — G8929 Other chronic pain: Secondary | ICD-10-CM | POA: Diagnosis not present

## 2021-11-19 NOTE — Therapy (Signed)
[facility] [address], [place], [address] Phone: [phone]   Fax:  [phone]  Physical Therapy Treatment  Patient Details  Name: Katie Huber MRN: [id number] Date of Birth: [birth year] Referring Provider (PT): Derrel Nip MD   Encounter Date: 11/19/2021   PT End of Session - 11/19/21 1122     Visit Number 3    Number of Visits 17    Date for PT Re-Evaluation 01/10/22    Authorization - Visit Number 3    Authorization - Number of Visits 10    PT Start Time 1116    PT Stop Time 1200    PT Time Calculation (min) 44 min    Activity Tolerance Patient tolerated treatment well    Behavior During Therapy [facility] for tasks assessed/performed             Past Medical History:  Diagnosis Date   Asthma 1990   Hypertension    Obesity, Class III, BMI 40-49.9 (morbid obesity) ([place])    weight fluctuations of 100 lbs     Past Surgical History:  Procedure Laterality Date   ABDOMINAL HYSTERECTOMY  2001   BREAST BIOPSY Right    Neg   BREAST EXCISIONAL BIOPSY Right 1990s   multiple hematomas   CHOLECYSTECTOMY  1970s   COLONOSCOPY WITH PROPOFOL N/A 03/30/2018   Procedure: COLONOSCOPY WITH PROPOFOL;  Surgeon: Lucilla Lame, MD;  Location: [facility] ENDOSCOPY;  Service: Endoscopy;  Laterality: N/A;   JOINT REPLACEMENT     Bilateral   STOMACH SURGERY  [phone]   gastric partitionin( for obesity)     There were no vitals filed for this visit.   Subjective Assessment - 11/19/21 1118     Subjective Pt reports she was a little sore following TDN, but has not had that cramping/spasm pain since. She is now having some aching between both shoulder blades that is different than her normal pain, had this last night and had to leave a dinner due to this. Reports 0/10 pain today, which she does not see as an improvement because now she has different pain, although she admits she has only had one pain episode since last week.     Pertinent History Pt is a 70year old female presenting with neck/upper back pain that is chronic in nature "R side of my scapula" about a year. Reports about 2 months ago her pain started to get worse without specific incident. She reports her PCP gave her some stretches to do for this pain, and then she started having spasms with these that were severe, and she started flexeril. She still takes flexeril occassionally and it does help with her pain. She has seen a medical masseuse while waiting for PT appt which helped. 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 Patient is retired, enjoys socializing with friends and family, and reports she has had to leave events. Current pain and resting pain 4/10; and 10/10 with spasms that last only a little while. Patient reports aggravating pain is a cramp/spasm, and constant pain is a dull ache. Aggravating factors are standing, pushing/pulling, lifting, wrapping presents, cooking. Easing: rest, flexeril, ice/heat combo, massage. Patient lives alone, is able to complete all ADLs with modifications needed occassionally. Pt is R handed. Pt denies N/V, B&B changes, unexplained weight fluctuation, saddle paresthesia, fever, night sweats, or unrelenting night pain at this time.    Limitations Lifting;Standing;Sitting    How long can you sit  comfortably? without support ~1hour; with support unlimited    How long can you stand comfortably? ~ 1 hour    How long can you walk comfortably? unlimited    Patient Stated Goals decrease pain to return to social activities    Pain Onset More than a month ago            Ther-Ex Nustep seat 9 UE 6 L3 for gentle motion only  Bilat ER with GTB 3x 12; max cuing and demo during first set with patient unable to demonstrate scapular activation, with shoulder abd; for second set modified to standing with back against wall and towels under elbows with better compliance  Supine scapular retraction +  thoracic ext with hands behind head 3x 10/9/8 with min cuing for full ROM, able to lift head approx 1in from table with minimal thoracic ext  OMEGA seated rows 20# 2x 10 with good carry over of initial cuing to prevent excessive protraction at end range return  Theraball forward rolls x12 with cuing for breath control with good carry over Thoracolumbar ext over towel roll x12 added to HEP Post shoulder rolls x15 with good carry over following demo of technique  Education on posture at restaurant and in car on way to her dinners with towel rolled to support thoracolumbar ext with understanding                               PT Education - 11/19/21 1121     Education Details therex form/technique, posture education    Person(s) Educated Patient    Methods Explanation;Demonstration;Verbal cues    Comprehension Verbalized understanding;Returned demonstration;Verbal cues required              PT Short Term Goals - 11/12/21 1154       PT SHORT TERM GOAL #1   Title Pt will be independent with HEP in order to improve strength and decrease back pain in order to improve pain-free function at home and in the community    Baseline 11/12/21 HEP given    Time 4    Period Weeks    Status New               PT Long Term Goals - 11/12/21 1155       PT LONG TERM GOAL #1   Title Pt will increase periscapular strength to at least 4/5 MMT grade in order to demonstrate improvement in strength and function needed for heavy household tasks    Baseline 11/12/21 3/5 bilat Lower trap Y position; 3+/5 Scapular retraction T position    Time 8    Period Weeks    Status New      PT LONG TERM GOAL #2   Title Pt will decrease scapular pain as reported on NPRS by at least 2 points in order to demonstrate clinically significant reduction in back pain.    Baseline 11/12/21 10/10    Time 8    Period Weeks    Status New      PT LONG TERM GOAL #3   Title Patient will  increase FOTO score to 69 to demonstrate predicted increase in functional mobility to complete ADLs    Baseline 11/12/21 48    Time 8    Period Weeks    Status New                   Plan - 11/19/21 1147  Clinical Impression Statement In lieu of pain, PT initiated therex for increased periscapular strengthening with carry over into postural training with success. Pt reports no pain throughout session, with excellent carry over of cuing for proper technique, with multimodal cuing and modifications needed for proper scapulohumeral rhythm of therex. PT educated patient on postural support to help with increasing thoracic mobility needed for efficient scapular movement with good understanding. PT will continue progression as able.    Personal Factors and Comorbidities Age;Comorbidity 2;Fitness;Past/Current Experience;Time since onset of injury/illness/exacerbation    Comorbidities HTN, obesity    Examination-Activity Limitations Lift;Carry;Sit;Stand;Reach Overhead    Examination-Participation Restrictions Driving;Meal Prep;Community Activity;Cleaning;Laundry;Interpersonal Relationship    Stability/Clinical Decision Making Evolving/Moderate complexity    Clinical Decision Making Moderate    Rehab Potential Good    PT Frequency 2x / week    PT Duration 8 weeks    PT Treatment/Interventions Biofeedback;Electrical Stimulation;Traction;Ultrasound;Gait training;DME Instruction;Moist Heat;Cryotherapy;ADLs/Self Care Home Management;Iontophoresis 4mg /ml Dexamethasone;Functional mobility training;Therapeutic exercise;Neuromuscular re-education;Patient/family education;Spinal Manipulations;Joint Manipulations;Dry needling;Manual techniques;Therapeutic activities    PT Next Visit Plan HEP review, increase thoracic ext with efficient pushing/pulling strengthening    PT Home Exercise Plan scapular retractions, thoracic ext    Consulted and Agree with Plan of Care Patient             Patient  will benefit from skilled therapeutic intervention in order to improve the following deficits and impairments:  Decreased activity tolerance, Decreased endurance, Decreased range of motion, Decreased strength, Increased fascial restricitons, Impaired UE functional use, Improper body mechanics, Pain, Obesity, Postural dysfunction, Impaired tone, Impaired flexibility, Increased muscle spasms, Decreased coordination, Decreased mobility  Visit Diagnosis: Pain in thoracic spine  Stiffness of right shoulder, not elsewhere classified     Problem List Patient Active Problem List   Diagnosis Date Noted   Hypercalcemia 06/17/2021   Major depressive disorder, recurrent episode with anxious distress ([place]) 06/11/2021   Onychomycosis of right great toe 03/04/2021   Coronary artery calcification of native artery 11/10/2020   Abdominal aortic atherosclerosis ([place]) 11/10/2020   Oral cavity pain 01/09/2020   Vitamin D deficiency 01/09/2020   Diarrhea, functional 07/26/2018   Tubular adenoma of colon 04/01/2018   Benign neoplasm of cecum    Encounter for screening colonoscopy    Rectal polyp    B12 deficiency 01/26/2018   Chronic respiratory failure with hypoxia ([place]) 12/11/2017   Hospital discharge follow-up 03/14/2017   Mild intermittent asthma 03/04/2017   Herpes simplex infection 08/16/2015   Skin neoplasm 08/16/2015   Pulmonary hypertension ([place]) 02/06/2015   S/P vaginal hysterectomy 06/13/2014   Encounter for preventive health examination 06/13/2014   Essential hypertension, benign 12/27/2013   S/P bariatric surgery 09/20/2013   Insomnia due to anxiety and fear 06/14/2013   Type 2 diabetes mellitus with obesity ([place]) 03/02/2013   Hyperlipidemia associated with type 2 diabetes mellitus ([place]) 12/17/2011   Encounter for long-term (current) use of other medications 12/17/2011   Cervicalgia 12/16/2011   Sleep apnea 12/16/2011   Durwin Reges DPT Durwin Reges, PT 11/19/2021, 12:22  PM  [facility] [facility] [address], [place], [address] Phone: [phone]   Fax:  [phone]  Name: Beatris Belen MRN: [id number] Date of Birth: 1951/08/30

## 2021-11-21 ENCOUNTER — Ambulatory Visit: Payer: Medicare Other | Admitting: Physical Therapy

## 2021-11-26 ENCOUNTER — Ambulatory Visit: Payer: Medicare Other | Admitting: Physical Therapy

## 2021-11-28 ENCOUNTER — Encounter: Payer: Self-pay | Admitting: Physical Therapy

## 2021-11-28 ENCOUNTER — Ambulatory Visit: Payer: Medicare Other | Admitting: Physical Therapy

## 2021-11-28 DIAGNOSIS — M25611 Stiffness of right shoulder, not elsewhere classified: Secondary | ICD-10-CM | POA: Diagnosis not present

## 2021-11-28 DIAGNOSIS — M546 Pain in thoracic spine: Secondary | ICD-10-CM | POA: Diagnosis not present

## 2021-11-28 DIAGNOSIS — M25519 Pain in unspecified shoulder: Secondary | ICD-10-CM | POA: Diagnosis not present

## 2021-11-28 DIAGNOSIS — G8929 Other chronic pain: Secondary | ICD-10-CM | POA: Diagnosis not present

## 2021-11-28 NOTE — Therapy (Signed)
[place] [facility] [address] [place], [address] Phone: [phone]   Fax:  [phone]  Physical Therapy Treatment  Patient Details  Name: Katie Huber MRN: [id number] Date of Birth: [birth year] Referring Provider (PT): Derrel Nip MD   Encounter Date: 11/28/2021   PT End of Session - 11/28/21 1123     Visit Number 4    Number of Visits 17    Date for PT Re-Evaluation 01/10/22    Authorization - Visit Number 4    Authorization - Number of Visits 10    PT Start Time 1119    PT Stop Time 1158    PT Time Calculation (min) 39 min    Activity Tolerance Patient tolerated treatment well    Behavior During Therapy [facility] for tasks assessed/performed             Past Medical History:  Diagnosis Date   Asthma 1990   Hypertension    Obesity, Class III, BMI 40-49.9 (morbid obesity) ([place])    weight fluctuations of 100 lbs     Past Surgical History:  Procedure Laterality Date   ABDOMINAL HYSTERECTOMY  2001   BREAST BIOPSY Right    Neg   BREAST EXCISIONAL BIOPSY Right 1990s   multiple hematomas   CHOLECYSTECTOMY  1970s   COLONOSCOPY WITH PROPOFOL N/A 03/30/2018   Procedure: COLONOSCOPY WITH PROPOFOL;  Surgeon: Lucilla Lame, MD;  Location: [facility] ENDOSCOPY;  Service: Endoscopy;  Laterality: N/A;   JOINT REPLACEMENT     Bilateral   STOMACH SURGERY  [phone]   gastric partitionin( for obesity)     There were no vitals filed for this visit.   Subjective Assessment - 11/28/21 1121     Subjective Pt reports she has had more pain over the holiday. She reports continuous aches and spasms over the past week. Reports she has had some medical massage and thinks that really helped. After that massage she applied heat to the area and it has been better over the past couple days with this. She reports that she has been completing HEP and using towel in car to help support her back.    Pertinent History Pt is a 70year old female  presenting with neck/upper back pain that is chronic in nature "R side of my scapula" about a year. Reports about 2 months ago her pain started to get worse without specific incident. She reports her PCP gave her some stretches to do for this pain, and then she started having spasms with these that were severe, and she started flexeril. She still takes flexeril occassionally and it does help with her pain. She has seen a medical masseuse while waiting for PT appt which helped. 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 Patient is retired, enjoys socializing with friends and family, and reports she has had to leave events. Current pain and resting pain 4/10; and 10/10 with spasms that last only a little while. Patient reports aggravating pain is a cramp/spasm, and constant pain is a dull ache. Aggravating factors are standing, pushing/pulling, lifting, wrapping presents, cooking. Easing: rest, flexeril, ice/heat combo, massage. Patient lives alone, is able to complete all ADLs with modifications needed occassionally. Pt is R handed. Pt denies N/V, B&B changes, unexplained weight fluctuation, saddle paresthesia, fever, night sweats, or unrelenting night pain at this time.    Limitations Lifting;Standing;Sitting    How long can you sit comfortably? without support ~1hour; with support unlimited  How long can you stand comfortably? ~ 1 hour    How long can you walk comfortably? unlimited    Patient Stated Goals decrease pain to return to social activities    Pain Onset More than a month ago               Ther-Ex Nustep seat 9 UE 6 L3 for gentle motion only   Thoracolumbar ext over towel roll with hands behind head for pec stretch x12 with cuing for breath control with good carry over  Modified standing on wall alt birddog x12; supine alt supermans x12; with cervical lift x12 with min cuing for sequencing; rest following in prone prop  Supine scapular  retraction + thoracic ext with hands behind head 2x 10 with min cuing for full ROM minimal range, but more than last session  Mini squat with dowel across shoulders (back squat position, rests around C4 unable to drop further) 2x 12   Theraball forward rolls x12 with cuing for breath control with good carry over  Post shoulder rolls x15 with good carry over following demo of technique   Education on posture at restaurant and in car on way to her dinners with towel rolled to support thoracolumbar ext with understanding                           PT Education - 11/28/21 1122     Education Details therex form/technique, posture education    Person(s) Educated Patient    Methods Explanation;Verbal cues;Demonstration    Comprehension Verbalized understanding;Verbal cues required;Returned demonstration              PT Short Term Goals - 11/12/21 1154       PT SHORT TERM GOAL #1   Title Pt will be independent with HEP in order to improve strength and decrease back pain in order to improve pain-free function at home and in the community    Baseline 11/12/21 HEP given    Time 4    Period Weeks    Status New               PT Long Term Goals - 11/12/21 1155       PT LONG TERM GOAL #1   Title Pt will increase periscapular strength to at least 4/5 MMT grade in order to demonstrate improvement in strength and function needed for heavy household tasks    Baseline 11/12/21 3/5 bilat Lower trap Y position; 3+/5 Scapular retraction T position    Time 8    Period Weeks    Status New      PT LONG TERM GOAL #2   Title Pt will decrease scapular pain as reported on NPRS by at least 2 points in order to demonstrate clinically significant reduction in back pain.    Baseline 11/12/21 10/10    Time 8    Period Weeks    Status New      PT LONG TERM GOAL #3   Title Patient will increase FOTO score to 69 to demonstrate predicted increase in functional mobility to  complete ADLs    Baseline 11/12/21 48    Time 8    Period Weeks    Status New                   Plan - 11/28/21 1134     Clinical Impression Statement PT continued therex progression for increased scapulothoracic mobility, and periscapular  strengthening with success. PT regressed strengthening slightly from last session, with increased rest breaks due to pain following last session. Pt is able to comply with all cuing for proper technique of therex with good motivation throughout session. Patient is demonstrating increased overall mobility from evaluation with no pain reported throughout session. PT will continue progression as able.    Personal Factors and Comorbidities Age;Comorbidity 2;Fitness;Past/Current Experience;Time since onset of injury/illness/exacerbation    Comorbidities HTN, obesity    Examination-Activity Limitations Lift;Carry;Sit;Stand;Reach Overhead    Examination-Participation Restrictions Driving;Meal Prep;Community Activity;Cleaning;Laundry;Interpersonal Relationship    Stability/Clinical Decision Making Evolving/Moderate complexity    Clinical Decision Making Moderate    Rehab Potential Good    PT Frequency 2x / week    PT Duration 8 weeks    PT Treatment/Interventions Biofeedback;Electrical Stimulation;Traction;Ultrasound;Gait training;DME Instruction;Moist Heat;Cryotherapy;ADLs/Self Care Home Management;Iontophoresis 4mg /ml Dexamethasone;Functional mobility training;Therapeutic exercise;Neuromuscular re-education;Patient/family education;Spinal Manipulations;Joint Manipulations;Dry needling;Manual techniques;Therapeutic activities    PT Next Visit Plan increase thoracic ext with efficient pushing/pulling strengthening    PT Home Exercise Plan scapular retractions, thoracic ext    Consulted and Agree with Plan of Care Patient             Patient will benefit from skilled therapeutic intervention in order to improve the following deficits and impairments:   Decreased activity tolerance, Decreased endurance, Decreased range of motion, Decreased strength, Increased fascial restricitons, Impaired UE functional use, Improper body mechanics, Pain, Obesity, Postural dysfunction, Impaired tone, Impaired flexibility, Increased muscle spasms, Decreased coordination, Decreased mobility  Visit Diagnosis: Pain in thoracic spine  Stiffness of right shoulder, not elsewhere classified     Problem List Patient Active Problem List   Diagnosis Date Noted   Hypercalcemia 06/17/2021   Major depressive disorder, recurrent episode with anxious distress ([place]) 06/11/2021   Onychomycosis of right great toe 03/04/2021   Coronary artery calcification of native artery 11/10/2020   Abdominal aortic atherosclerosis ([place]) 11/10/2020   Oral cavity pain 01/09/2020   Vitamin D deficiency 01/09/2020   Diarrhea, functional 07/26/2018   Tubular adenoma of colon 04/01/2018   Benign neoplasm of cecum    Encounter for screening colonoscopy    Rectal polyp    B12 deficiency 01/26/2018   Chronic respiratory failure with hypoxia ([place]) 12/11/2017   Hospital discharge follow-up 03/14/2017   Mild intermittent asthma 03/04/2017   Herpes simplex infection 08/16/2015   Skin neoplasm 08/16/2015   Pulmonary hypertension ([place]) 02/06/2015   S/P vaginal hysterectomy 06/13/2014   Encounter for preventive health examination 06/13/2014   Essential hypertension, benign 12/27/2013   S/P bariatric surgery 09/20/2013   Insomnia due to anxiety and fear 06/14/2013   Type 2 diabetes mellitus with obesity ([place]) 03/02/2013   Hyperlipidemia associated with type 2 diabetes mellitus ([place]) 12/17/2011   Encounter for long-term (current) use of other medications 12/17/2011   Cervicalgia 12/16/2011   Sleep apnea 12/16/2011   Durwin Reges DPT Durwin Reges, PT 11/28/2021, 12:49 PM  [facility] [facility] [address]  [place], [address] Phone: [phone]   Fax:  [phone]  Name: Katie Huber MRN: [id number] Date of Birth: [birth year]

## 2021-12-03 ENCOUNTER — Ambulatory Visit: Payer: Medicare Other | Admitting: Physical Therapy

## 2021-12-03 ENCOUNTER — Encounter: Payer: Self-pay | Admitting: Physical Therapy

## 2021-12-03 ENCOUNTER — Ambulatory Visit: Payer: Medicare Other | Attending: Internal Medicine | Admitting: Physical Therapy

## 2021-12-03 ENCOUNTER — Other Ambulatory Visit: Payer: Self-pay | Admitting: Internal Medicine

## 2021-12-03 DIAGNOSIS — M546 Pain in thoracic spine: Secondary | ICD-10-CM | POA: Diagnosis not present

## 2021-12-03 DIAGNOSIS — M25611 Stiffness of right shoulder, not elsewhere classified: Secondary | ICD-10-CM | POA: Diagnosis not present

## 2021-12-03 NOTE — Therapy (Signed)
[facility] [address], [place], [address] Phone: [phone]   Fax:  [phone]  Physical Therapy Treatment  Patient Details  Name: Katie Huber MRN: [id number] Date of Birth: December 19, 1950 Referring Provider (PT): Derrel Nip MD   Encounter Date: 12/03/2021   PT End of Session - 12/03/21 1126     Visit Number 5    Number of Visits 17    Date for PT Re-Evaluation 01/10/22    Authorization - Visit Number 5    Authorization - Number of Visits 10    PT Start Time 1117    PT Stop Time 1158    PT Time Calculation (min) 41 min    Activity Tolerance Patient tolerated treatment well    Behavior During Therapy [facility] for tasks assessed/performed             Past Medical History:  Diagnosis Date   Asthma 1990   Hypertension    Obesity, Class III, BMI 40-49.9 (morbid obesity) ([place])    weight fluctuations of 100 lbs     Past Surgical History:  Procedure Laterality Date   ABDOMINAL HYSTERECTOMY  2001   BREAST BIOPSY Right    Neg   BREAST EXCISIONAL BIOPSY Right 1990s   multiple hematomas   CHOLECYSTECTOMY  1970s   COLONOSCOPY WITH PROPOFOL N/A 03/30/2018   Procedure: COLONOSCOPY WITH PROPOFOL;  Surgeon: Lucilla Lame, MD;  Location: [facility] ENDOSCOPY;  Service: Endoscopy;  Laterality: N/A;   JOINT REPLACEMENT     Bilateral   STOMACH SURGERY  [phone]   gastric partitionin( for obesity)     There were no vitals filed for this visit.   Subjective Assessment - 12/03/21 1119     Subjective Reports her pain is intermittent instead of constant which is an improvement. She does have a heating pad she has been using. Reports since last appt she had one spasm episode, when she was mopping for about 31mins and just had to stop and pain went away    Pertinent History Pt is a 71year old female presenting with neck/upper back pain that is chronic in nature "R side of my scapula" about a year. Reports about 2 months ago her  pain started to get worse without specific incident. She reports her PCP gave her some stretches to do for this pain, and then she started having spasms with these that were severe, and she started flexeril. She still takes flexeril occassionally and it does help with her pain. She has seen a medical masseuse while waiting for PT appt which helped. 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 Patient is retired, enjoys socializing with friends and family, and reports she has had to leave events. Current pain and resting pain 4/10; and 10/10 with spasms that last only a little while. Patient reports aggravating pain is a cramp/spasm, and constant pain is a dull ache. Aggravating factors are standing, pushing/pulling, lifting, wrapping presents, cooking. Easing: rest, flexeril, ice/heat combo, massage. Patient lives alone, is able to complete all ADLs with modifications needed occassionally. Pt is R handed. Pt denies N/V, B&B changes, unexplained weight fluctuation, saddle paresthesia, fever, night sweats, or unrelenting night pain at this time.    Limitations Lifting;Standing;Sitting    How long can you sit comfortably? without support ~1hour; with support unlimited    How long can you stand comfortably? ~ 1 hour    How long can you walk comfortably? unlimited  Patient Stated Goals decrease pain to return to social activities    Pain Onset More than a month ago            Ther-Ex Nustep seat 9 UE 6 L3 for gentle motion only   Thoracolumbar ext over towel roll with hands behind head for pec stretch x12 with cuing for breath control with good carry over   Modified standing on wall alt birddog x12;   Supine alt supermans with cervical lift x12 with min cuing for sequencing; Full supermans x12; rest following in prone prop   Mini squat with dowel overhead 3x 07/08/09 with cuing for sequencing with good carry over   Straight leg hip hinge with dowel held at occiput  x12; with 10# DB bilat x6 with difficulty maintaining technique with resistance   Post shoulder rolls x15 with good carry over following demo of technique                             PT Education - 12/03/21 1125     Education Details therex form/technique    Person(s) Educated Patient    Methods Explanation;Demonstration;Verbal cues    Comprehension Verbal cues required;Verbalized understanding;Returned demonstration              PT Short Term Goals - 11/12/21 1154       PT SHORT TERM GOAL #1   Title Pt will be independent with HEP in order to improve strength and decrease back pain in order to improve pain-free function at home and in the community    Baseline 11/12/21 HEP given    Time 4    Period Weeks    Status New               PT Long Term Goals - 11/12/21 1155       PT LONG TERM GOAL #1   Title Pt will increase periscapular strength to at least 4/5 MMT grade in order to demonstrate improvement in strength and function needed for heavy household tasks    Baseline 11/12/21 3/5 bilat Lower trap Y position; 3+/5 Scapular retraction T position    Time 8    Period Weeks    Status New      PT LONG TERM GOAL #2   Title Pt will decrease scapular pain as reported on NPRS by at least 2 points in order to demonstrate clinically significant reduction in back pain.    Baseline 11/12/21 10/10    Time 8    Period Weeks    Status New      PT LONG TERM GOAL #3   Title Patient will increase FOTO score to 69 to demonstrate predicted increase in functional mobility to complete ADLs    Baseline 11/12/21 48    Time 8    Period Weeks    Status New                   Plan - 12/03/21 1141     Clinical Impression Statement PT continued therex progression for increased scapulohumeral rhythm, mobility, and periscapular strength with progression of functional movements with proper sequencing with success. Patient is able to comply with all  cuing for proper technique of therex with good motivation and no increased pain throughout session. PT will continue progression as able.    Personal Factors and Comorbidities Age;Comorbidity 2;Fitness;Past/Current Experience;Time since onset of injury/illness/exacerbation    Comorbidities HTN, obesity  Examination-Activity Limitations Lift;Carry;Sit;Stand;Reach Overhead    Examination-Participation Restrictions Driving;Meal Prep;Community Activity;Cleaning;Laundry;Interpersonal Relationship    Stability/Clinical Decision Making Evolving/Moderate complexity    Clinical Decision Making Moderate    Rehab Potential Good    PT Frequency 2x / week    PT Duration 8 weeks    PT Treatment/Interventions Biofeedback;Electrical Stimulation;Traction;Ultrasound;Gait training;DME Instruction;Moist Heat;Cryotherapy;ADLs/Self Care Home Management;Iontophoresis 4mg /ml Dexamethasone;Functional mobility training;Therapeutic exercise;Neuromuscular re-education;Patient/family education;Spinal Manipulations;Joint Manipulations;Dry needling;Manual techniques;Therapeutic activities    PT Next Visit Plan increase thoracic ext with efficient pushing/pulling strengthening    PT Home Exercise Plan scapular retractions, thoracic ext    Consulted and Agree with Plan of Care Patient             Patient will benefit from skilled therapeutic intervention in order to improve the following deficits and impairments:  Decreased activity tolerance, Decreased endurance, Decreased range of motion, Decreased strength, Increased fascial restricitons, Impaired UE functional use, Improper body mechanics, Pain, Obesity, Postural dysfunction, Impaired tone, Impaired flexibility, Increased muscle spasms, Decreased coordination, Decreased mobility  Visit Diagnosis: Pain in thoracic spine  Stiffness of right shoulder, not elsewhere classified     Problem List Patient Active Problem List   Diagnosis Date Noted   Hypercalcemia  06/17/2021   Major depressive disorder, recurrent episode with anxious distress ([place]) 06/11/2021   Onychomycosis of right great toe 03/04/2021   Coronary artery calcification of native artery 11/10/2020   Abdominal aortic atherosclerosis ([place]) 11/10/2020   Oral cavity pain 01/09/2020   Vitamin D deficiency 01/09/2020   Diarrhea, functional 07/26/2018   Tubular adenoma of colon 04/01/2018   Benign neoplasm of cecum    Encounter for screening colonoscopy    Rectal polyp    B12 deficiency 01/26/2018   Chronic respiratory failure with hypoxia ([place]) 12/11/2017   Hospital discharge follow-up 03/14/2017   Mild intermittent asthma 03/04/2017   Herpes simplex infection 08/16/2015   Skin neoplasm 08/16/2015   Pulmonary hypertension ([place]) 02/06/2015   S/P vaginal hysterectomy 06/13/2014   Encounter for preventive health examination 06/13/2014   Essential hypertension, benign 12/27/2013   S/P bariatric surgery 09/20/2013   Insomnia due to anxiety and fear 06/14/2013   Type 2 diabetes mellitus with obesity ([place]) 03/02/2013   Hyperlipidemia associated with type 2 diabetes mellitus ([place]) 12/17/2011   Encounter for long-term (current) use of other medications 12/17/2011   Cervicalgia 12/16/2011   Sleep apnea 12/16/2011   Durwin Reges DPT Durwin Reges, PT 12/03/2021, 5:46 PM  [place] [facility] [address] [place], [address] Phone: [phone]   Fax:  [phone]  Name: Katie Huber MRN: [id number] Date of Birth: 1951-03-17

## 2021-12-05 ENCOUNTER — Encounter: Payer: Self-pay | Admitting: Physical Therapy

## 2021-12-05 ENCOUNTER — Ambulatory Visit: Payer: Medicare Other | Admitting: Physical Therapy

## 2021-12-05 DIAGNOSIS — M546 Pain in thoracic spine: Secondary | ICD-10-CM

## 2021-12-05 DIAGNOSIS — M25611 Stiffness of right shoulder, not elsewhere classified: Secondary | ICD-10-CM | POA: Diagnosis not present

## 2021-12-05 NOTE — Therapy (Signed)
[facility] [address] [place], [address] Phone: [phone]   Fax:  [phone]  Physical Therapy Treatment  Patient Details  Name: Katie Huber MRN: [id number] Date of Birth: 16-Mar-1951 Referring Provider (PT): Derrel Nip MD   Encounter Date: 12/05/2021   PT End of Session - 12/05/21 1130     Visit Number 6    Number of Visits 17    Date for PT Re-Evaluation 01/10/22    Authorization - Visit Number 6    Authorization - Number of Visits 10    PT Start Time 1115    PT Stop Time 1155    PT Time Calculation (min) 40 min    Activity Tolerance Patient tolerated treatment well    Behavior During Therapy [facility] for tasks assessed/performed             Past Medical History:  Diagnosis Date   Asthma 1990   Hypertension    Obesity, Class III, BMI 40-49.9 (morbid obesity) ([place])    weight fluctuations of 100 lbs     Past Surgical History:  Procedure Laterality Date   ABDOMINAL HYSTERECTOMY  2001   BREAST BIOPSY Right    Neg   BREAST EXCISIONAL BIOPSY Right 1990s   multiple hematomas   CHOLECYSTECTOMY  1970s   COLONOSCOPY WITH PROPOFOL N/A 03/30/2018   Procedure: COLONOSCOPY WITH PROPOFOL;  Surgeon: Lucilla Lame, MD;  Location: [facility] ENDOSCOPY;  Service: Endoscopy;  Laterality: N/A;   JOINT REPLACEMENT     Bilateral   STOMACH SURGERY  [phone]   gastric partitionin( for obesity)     There were no vitals filed for this visit.   Subjective Assessment - 12/05/21 1121     Subjective Pt reports no spasm pain since last session, she reports that she is completing HEP as well without issue. Pt reports she would like to decrease to 1x/week to maintain progress for a while.    Pertinent History Pt is a 71year old female presenting with neck/upper back pain that is chronic in nature "R side of my scapula" about a year. Reports about 2 months ago her pain started to get worse without specific incident. She reports  her PCP gave her some stretches to do for this pain, and then she started having spasms with these that were severe, and she started flexeril. She still takes flexeril occassionally and it does help with her pain. She has seen a medical masseuse while waiting for PT appt which helped. 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 Patient is retired, enjoys socializing with friends and family, and reports she has had to leave events. Current pain and resting pain 4/10; and 10/10 with spasms that last only a little while. Patient reports aggravating pain is a cramp/spasm, and constant pain is a dull ache. Aggravating factors are standing, pushing/pulling, lifting, wrapping presents, cooking. Easing: rest, flexeril, ice/heat combo, massage. Patient lives alone, is able to complete all ADLs with modifications needed occassionally. Pt is R handed. Pt denies N/V, B&B changes, unexplained weight fluctuation, saddle paresthesia, fever, night sweats, or unrelenting night pain at this time.    Limitations Lifting;Standing;Sitting    How long can you sit comfortably? without support ~1hour; with support unlimited    How long can you stand comfortably? ~ 1 hour    How long can you walk comfortably? unlimited    Patient Stated Goals decrease pain to return to social activities  Pain Onset More than a month ago               Ther-Ex Nustep seat 9 UE 6 L3 for gentle motion only   Thoracolumbar ext standing with hands behind head for pec stretch x12 with cuing for breath control with good carry over   Supine alt supermans with cervical lift x12 with min cuing for sequencing; Full supermans x12; rest following in prone prop  Seated rows 20# 2x 10/8 with good carry over following demo and cuing initially    Lat pulldown 25# 2x 10 with good carry over of demo and initial cuing for reducing shoulder hiking with eccentric moment  Y on wall GTB x10 Bilat ER GTB x10  Good  carry over following demo and cuing for posture for each  PT reviewed the following HEP with patient with patient able to demonstrate a set of the following with min cuing for correction needed. PT educated patient on parameters of therex (how/when to inc/decrease intensity, frequency, rep/set range, stretch hold time, and purpose of therex) with verbalized understanding.  Access Code: [email or web address] Full Superman on Table - 1 x daily - 1-2 x weekly - 3 sets - 10-12 reps Seated Row Cable Machine - 1 x daily - 1-2 x weekly - 3 sets - 6-12 reps Lat Pull Down - 1 x daily - 1-2 x weekly - 3 sets - 6-12 reps Shoulder External Rotation and Scapular Retraction with Resistance - 1 x daily - 1-2 x weekly - 3 sets - 6-12 reps Standing Low Trap Setting with Resistance at [place] - 1 x daily - 1-2 x weekly - 3 sets - 6-12 reps                         PT Education - 12/05/21 1130     Education Details therex form/technique    Person(s) Educated Patient    Methods Explanation;Demonstration;Verbal cues    Comprehension Verbalized understanding;Returned demonstration;Verbal cues required              PT Short Term Goals - 11/12/21 1154       PT SHORT TERM GOAL #1   Title Pt will be independent with HEP in order to improve strength and decrease back pain in order to improve pain-free function at home and in the community    Baseline 11/12/21 HEP given    Time 4    Period Weeks    Status New               PT Long Term Goals - 11/12/21 1155       PT LONG TERM GOAL #1   Title Pt will increase periscapular strength to at least 4/5 MMT grade in order to demonstrate improvement in strength and function needed for heavy household tasks    Baseline 11/12/21 3/5 bilat Lower trap Y position; 3+/5 Scapular retraction T position    Time 8    Period Weeks    Status New      PT LONG TERM GOAL #2   Title Pt will decrease scapular pain as reported on NPRS by at least 2 points in order  to demonstrate clinically significant reduction in back pain.    Baseline 11/12/21 10/10    Time 8    Period Weeks    Status New      PT LONG TERM GOAL #3   Title Patient will increase FOTO score to 69 to demonstrate  predicted increase in functional mobility to complete ADLs    Baseline 11/12/21 48    Time 8    Period Weeks    Status New                   Plan - 12/05/21 1155     Clinical Impression Statement PT continued therex progression for increased scauplothoracic mobility and periscapular strengthening with success. Patient is able to comply with all multimodal cuing for proper technique of therex with no increased pain and good motivation throughout session. PT updated HEP to include strenth training 1x/week in conjunction with PT frequency of 1x/week, increasing ind toward discharge with patient able to demonstrate and verablize undersatdning of exercises, and strengthening parameters. PT will continue progression as able.    Personal Factors and Comorbidities Age;Comorbidity 2;Fitness;Past/Current Experience;Time since onset of injury/illness/exacerbation    Comorbidities HTN, obesity    Examination-Activity Limitations Lift;Carry;Sit;Stand;Reach Overhead    Examination-Participation Restrictions Driving;Meal Prep;Community Activity;Cleaning;Laundry;Interpersonal Relationship    Stability/Clinical Decision Making Evolving/Moderate complexity    Clinical Decision Making Moderate    Rehab Potential Good    PT Frequency 2x / week    PT Duration 8 weeks    PT Treatment/Interventions Biofeedback;Electrical Stimulation;Traction;Ultrasound;Gait training;DME Instruction;Moist Heat;Cryotherapy;ADLs/Self Care Home Management;Iontophoresis 4mg /ml Dexamethasone;Functional mobility training;Therapeutic exercise;Neuromuscular re-education;Patient/family education;Spinal Manipulations;Joint Manipulations;Dry needling;Manual techniques;Therapeutic activities    PT Next Visit Plan  increase thoracic ext with efficient pushing/pulling strengthening    PT Home Exercise Plan scapular retractions, thoracic ext    Consulted and Agree with Plan of Care Patient             Patient will benefit from skilled therapeutic intervention in order to improve the following deficits and impairments:  Decreased activity tolerance, Decreased endurance, Decreased range of motion, Decreased strength, Increased fascial restricitons, Impaired UE functional use, Improper body mechanics, Pain, Obesity, Postural dysfunction, Impaired tone, Impaired flexibility, Increased muscle spasms, Decreased coordination, Decreased mobility  Visit Diagnosis: Pain in thoracic spine  Stiffness of right shoulder, not elsewhere classified     Problem List Patient Active Problem List   Diagnosis Date Noted   Hypercalcemia 06/17/2021   Major depressive disorder, recurrent episode with anxious distress ([place]) 06/11/2021   Onychomycosis of right great toe 03/04/2021   Coronary artery calcification of native artery 11/10/2020   Abdominal aortic atherosclerosis ([place]) 11/10/2020   Oral cavity pain 01/09/2020   Vitamin D deficiency 01/09/2020   Diarrhea, functional 07/26/2018   Tubular adenoma of colon 04/01/2018   Benign neoplasm of cecum    Encounter for screening colonoscopy    Rectal polyp    B12 deficiency 01/26/2018   Chronic respiratory failure with hypoxia ([place]) 12/11/2017   Hospital discharge follow-up 03/14/2017   Mild intermittent asthma 03/04/2017   Herpes simplex infection 08/16/2015   Skin neoplasm 08/16/2015   Pulmonary hypertension ([place]) 02/06/2015   S/P vaginal hysterectomy 06/13/2014   Encounter for preventive health examination 06/13/2014   Essential hypertension, benign 12/27/2013   S/P bariatric surgery 09/20/2013   Insomnia due to anxiety and fear 06/14/2013   Type 2 diabetes mellitus with obesity ([place]) 03/02/2013   Hyperlipidemia associated with type 2 diabetes mellitus  ([place]) 12/17/2011   Encounter for long-term (current) use of other medications 12/17/2011   Cervicalgia 12/16/2011   Sleep apnea 12/16/2011   Durwin Reges DPT Durwin Reges, PT 12/05/2021, 11:57 AM  [facility] [facility] [address] [place], [address] Phone: [phone]   Fax:  [phone]  Name: Braelynne Garinger MRN: [id number] Date of Birth: [birth year]

## 2021-12-07 ENCOUNTER — Other Ambulatory Visit: Payer: Self-pay | Admitting: Internal Medicine

## 2021-12-07 DIAGNOSIS — E1169 Type 2 diabetes mellitus with other specified complication: Secondary | ICD-10-CM

## 2021-12-10 ENCOUNTER — Ambulatory Visit: Payer: Medicare Other | Admitting: Physical Therapy

## 2021-12-10 ENCOUNTER — Encounter: Payer: Self-pay | Admitting: Physical Therapy

## 2021-12-10 DIAGNOSIS — M546 Pain in thoracic spine: Secondary | ICD-10-CM

## 2021-12-10 DIAGNOSIS — M25611 Stiffness of right shoulder, not elsewhere classified: Secondary | ICD-10-CM

## 2021-12-10 NOTE — Therapy (Signed)
[facility] [address], [place], [address] Phone: [phone]   Fax:  [phone]  Physical Therapy Treatment  Patient Details  Name: Katie Huber MRN: [id number] Date of Birth: 15-Jun-1951 Referring Provider (PT): Derrel Nip MD   Encounter Date: 12/10/2021   PT End of Session - 12/10/21 1126     Visit Number 7    Number of Visits 17    Date for PT Re-Evaluation 01/10/22    Authorization - Visit Number 7    Authorization - Number of Visits 10    PT Start Time 1120    PT Stop Time 1158    PT Time Calculation (min) 38 min    Activity Tolerance Patient tolerated treatment well    Behavior During Therapy [facility] for tasks assessed/performed             Past Medical History:  Diagnosis Date   Asthma 1990   Hypertension    Obesity, Class III, BMI 40-49.9 (morbid obesity) ([place])    weight fluctuations of 100 lbs     Past Surgical History:  Procedure Laterality Date   ABDOMINAL HYSTERECTOMY  2001   BREAST BIOPSY Right    Neg   BREAST EXCISIONAL BIOPSY Right 1990s   multiple hematomas   CHOLECYSTECTOMY  1970s   COLONOSCOPY WITH PROPOFOL N/A 03/30/2018   Procedure: COLONOSCOPY WITH PROPOFOL;  Surgeon: Lucilla Lame, MD;  Location: [facility] ENDOSCOPY;  Service: Endoscopy;  Laterality: N/A;   JOINT REPLACEMENT     Bilateral   STOMACH SURGERY  [phone]   gastric partitionin( for obesity)     There were no vitals filed for this visit.   Subjective Assessment - 12/10/21 1123     Subjective Reports she is doing well overall. Was sore following last session, but has had no pain since, which she is pleased with. She has not started her exercising at the [facility] yet, but is going to go this week.    Pertinent History Pt is a 71year old female presenting with neck/upper back pain that is chronic in nature "R side of my scapula" about a year. Reports about 2 months ago her pain started to get worse without specific  incident. She reports her PCP gave her some stretches to do for this pain, and then she started having spasms with these that were severe, and she started flexeril. She still takes flexeril occassionally and it does help with her pain. She has seen a medical masseuse while waiting for PT appt which helped. 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 Patient is retired, enjoys socializing with friends and family, and reports she has had to leave events. Current pain and resting pain 4/10; and 10/10 with spasms that last only a little while. Patient reports aggravating pain is a cramp/spasm, and constant pain is a dull ache. Aggravating factors are standing, pushing/pulling, lifting, wrapping presents, cooking. Easing: rest, flexeril, ice/heat combo, massage. Patient lives alone, is able to complete all ADLs with modifications needed occassionally. Pt is R handed. Pt denies N/V, B&B changes, unexplained weight fluctuation, saddle paresthesia, fever, night sweats, or unrelenting night pain at this time.    Limitations Lifting;Standing;Sitting    How long can you sit comfortably? without support ~1hour; with support unlimited    How long can you stand comfortably? ~ 1 hour    How long can you walk comfortably? unlimited    Patient Stated Goals decrease pain to return  to social activities    Pain Onset More than a month ago             Ther-Ex Nustep seat 9 UE 6 L3 for gentle motion only   Thoracolumbar ext standing with hands behind head for pec stretch x12 with cuing for breath control with good carry over   Sidelying open book x12 with good carry over of initial set up cuing  Full superman 3x 10 with min cuing for max mobility with good carry over   Standing rows 15# 3x 10 with max cuing for initial set for proper technique with good carry over following   Forward tball rolls sitting x12 2sec hold min cuing for breath control                                      PT Education - 12/10/21 1126     Education Details therex form/technique    Person(s) Educated Patient    Methods Explanation;Verbal cues    Comprehension Verbalized understanding;Verbal cues required              PT Short Term Goals - 11/12/21 1154       PT SHORT TERM GOAL #1   Title Pt will be independent with HEP in order to improve strength and decrease back pain in order to improve pain-free function at home and in the community    Baseline 11/12/21 HEP given    Time 4    Period Weeks    Status New               PT Long Term Goals - 11/12/21 1155       PT LONG TERM GOAL #1   Title Pt will increase periscapular strength to at least 4/5 MMT grade in order to demonstrate improvement in strength and function needed for heavy household tasks    Baseline 11/12/21 3/5 bilat Lower trap Y position; 3+/5 Scapular retraction T position    Time 8    Period Weeks    Status New      PT LONG TERM GOAL #2   Title Pt will decrease scapular pain as reported on NPRS by at least 2 points in order to demonstrate clinically significant reduction in back pain.    Baseline 11/12/21 10/10    Time 8    Period Weeks    Status New      PT LONG TERM GOAL #3   Title Patient will increase FOTO score to 69 to demonstrate predicted increase in functional mobility to complete ADLs    Baseline 11/12/21 48    Time 8    Period Weeks    Status New                   Plan - 12/10/21 1159     Clinical Impression Statement PT continued therex progression for increased scapulothoracic mobility and periscapular strengthening with success. patient is able to comply with all cuing for proper technique of therex with excellent motivation and no increased pain throughout session. Pt will attempt HEP at [facility] this week to work toward independence of d/c.PT will continue progression as able.    Personal Factors and Comorbidities  Age;Comorbidity 2;Fitness;Past/Current Experience;Time since onset of injury/illness/exacerbation    Comorbidities HTN, obesity    Examination-Activity Limitations Lift;Carry;Sit;Stand;Reach Overhead    Examination-Participation Restrictions Driving;Meal Prep;Community Activity;Cleaning;Laundry;Interpersonal Relationship    Stability/Clinical  Decision Making Evolving/Moderate complexity    Clinical Decision Making Moderate    Rehab Potential Good    PT Frequency 2x / week    PT Duration 8 weeks    PT Treatment/Interventions Biofeedback;Electrical Stimulation;Traction;Ultrasound;Gait training;DME Instruction;Moist Heat;Cryotherapy;ADLs/Self Care Home Management;Iontophoresis 4mg /ml Dexamethasone;Functional mobility training;Therapeutic exercise;Neuromuscular re-education;Patient/family education;Spinal Manipulations;Joint Manipulations;Dry needling;Manual techniques;Therapeutic activities    PT Next Visit Plan increase thoracic ext with efficient pushing/pulling strengthening    PT Home Exercise Plan scapular retractions, thoracic ext    Consulted and Agree with Plan of Care Patient             Patient will benefit from skilled therapeutic intervention in order to improve the following deficits and impairments:  Decreased activity tolerance, Decreased endurance, Decreased range of motion, Decreased strength, Increased fascial restricitons, Impaired UE functional use, Improper body mechanics, Pain, Obesity, Postural dysfunction, Impaired tone, Impaired flexibility, Increased muscle spasms, Decreased coordination, Decreased mobility  Visit Diagnosis: Pain in thoracic spine  Stiffness of right shoulder, not elsewhere classified     Problem List Patient Active Problem List   Diagnosis Date Noted   Hypercalcemia 06/17/2021   Major depressive disorder, recurrent episode with anxious distress ([place]) 06/11/2021   Onychomycosis of right great toe 03/04/2021   Coronary artery calcification of  native artery 11/10/2020   Abdominal aortic atherosclerosis ([place]) 11/10/2020   Oral cavity pain 01/09/2020   Vitamin D deficiency 01/09/2020   Diarrhea, functional 07/26/2018   Tubular adenoma of colon 04/01/2018   Benign neoplasm of cecum    Encounter for screening colonoscopy    Rectal polyp    B12 deficiency 01/26/2018   Chronic respiratory failure with hypoxia ([place]) 12/11/2017   Hospital discharge follow-up 03/14/2017   Mild intermittent asthma 03/04/2017   Herpes simplex infection 08/16/2015   Skin neoplasm 08/16/2015   Pulmonary hypertension ([place]) 02/06/2015   S/P vaginal hysterectomy 06/13/2014   Encounter for preventive health examination 06/13/2014   Essential hypertension, benign 12/27/2013   S/P bariatric surgery 09/20/2013   Insomnia due to anxiety and fear 06/14/2013   Type 2 diabetes mellitus with obesity ([place]) 03/02/2013   Hyperlipidemia associated with type 2 diabetes mellitus ([place]) 12/17/2011   Encounter for long-term (current) use of other medications 12/17/2011   Cervicalgia 12/16/2011   Sleep apnea 12/16/2011   Durwin Reges DPT Durwin Reges, PT 12/10/2021, 12:01 PM  [facility] [address] [place], [address] Phone: [phone]   Fax:  [phone]  Name: Katie Huber MRN: [id number] Date of Birth: 04/06/1951

## 2021-12-11 ENCOUNTER — Telehealth: Payer: Self-pay | Admitting: Pharmacy Technician

## 2021-12-11 DIAGNOSIS — Z596 Low income: Secondary | ICD-10-CM

## 2021-12-11 NOTE — Progress Notes (Signed)
[facility])                                            [place] Team    12/11/2021  Kalyssa Anker [birth year] [phone]  Care coordination call placed to [employer].  Spoke to Richardson Landry who informs patient is APPROVED 12/01/21-11/30/22. He informs medication is in the process of shipping based on LFD in 2022 and will be delivered to the provider's address.  Johnaton Sonneborn P. Danesha Kirchoff, [place]  [phone]

## 2021-12-12 ENCOUNTER — Ambulatory Visit: Payer: Medicare Other | Admitting: Physical Therapy

## 2021-12-12 ENCOUNTER — Encounter: Payer: Medicare Other | Admitting: Physical Therapy

## 2021-12-17 ENCOUNTER — Ambulatory Visit: Payer: Medicare Other | Admitting: Physical Therapy

## 2021-12-18 ENCOUNTER — Telehealth: Payer: Self-pay | Admitting: Pharmacist

## 2021-12-18 DIAGNOSIS — E1169 Type 2 diabetes mellitus with other specified complication: Secondary | ICD-10-CM

## 2021-12-18 DIAGNOSIS — E669 Obesity, unspecified: Secondary | ICD-10-CM

## 2021-12-18 MED ORDER — FREESTYLE LIBRE 2 SENSOR MISC: 1.0000 | 11 refills | Status: DC

## 2021-12-18 NOTE — Telephone Encounter (Signed)
Received notice from [employer] that new DWO is required for Katie Huber. [employer] now covers [place] at the pharmacy for patients on insulin therapy. Unlikely insurance will continue to cover [place], whether at the pharmacy or through [place], now that she is not on insulin.   Script sent to [facility]. Patient will call later today to see if covered.   Discussed that given controlled A1c, it is not necessary for patient to be checking fingersticks 5-6 times daily. She is worried her glucose will get out of control again. Reviewed that periodic finger sticks and weight monitoring are likely to help keep glucose well controlled.

## 2021-12-19 ENCOUNTER — Ambulatory Visit: Payer: Medicare Other | Admitting: Physical Therapy

## 2021-12-19 ENCOUNTER — Encounter: Payer: Medicare Other | Admitting: Physical Therapy

## 2021-12-24 ENCOUNTER — Encounter: Payer: Medicare Other | Admitting: Physical Therapy

## 2021-12-24 ENCOUNTER — Ambulatory Visit: Payer: Medicare Other | Admitting: Physical Therapy

## 2021-12-26 ENCOUNTER — Encounter: Payer: Medicare Other | Admitting: Physical Therapy

## 2021-12-26 ENCOUNTER — Ambulatory Visit: Payer: Medicare Other | Admitting: Physical Therapy

## 2021-12-31 ENCOUNTER — Ambulatory Visit: Payer: Medicare Other | Admitting: Physical Therapy

## 2021-12-31 ENCOUNTER — Encounter: Payer: Medicare Other | Admitting: Physical Therapy

## 2022-01-06 ENCOUNTER — Telehealth: Payer: Self-pay | Admitting: Internal Medicine

## 2022-01-06 NOTE — Telephone Encounter (Signed)
Pt called in stating that she have a questions about medication (Ozempic). Pt stated that she will not be home until after 12pm. Pt requesting callback.

## 2022-01-06 NOTE — Telephone Encounter (Signed)
Spoke with pt and she stated that she is out of her Ozempic and she spoke with [facility], they stated that the medication has been shipped out but is not sure when it will arrive. Pt is wanting to know if we can give her a sample to hold her over until it comes in. Pt is taking 1 mg weekly we only have the 8mg /67mL pen in the office. Is this okay to give her with a chart to show her how many clicks to dial?

## 2022-01-06 NOTE — Telephone Encounter (Signed)
Patient Called in needing to speak with Janett Billow about medication Please call patient @ [phone]

## 2022-01-07 NOTE — Telephone Encounter (Signed)
Per request I left a message letting pt know that the sample is ready for her to pick up.

## 2022-01-09 ENCOUNTER — Other Ambulatory Visit: Payer: Self-pay | Admitting: Internal Medicine

## 2022-01-09 DIAGNOSIS — Z1231 Encounter for screening mammogram for malignant neoplasm of breast: Secondary | ICD-10-CM

## 2022-01-10 ENCOUNTER — Encounter: Payer: Medicare Other | Admitting: Physical Therapy

## 2022-01-13 ENCOUNTER — Ambulatory Visit: Payer: Medicare Other | Admitting: Internal Medicine

## 2022-01-22 ENCOUNTER — Telehealth: Payer: Self-pay

## 2022-01-22 NOTE — Telephone Encounter (Signed)
Received patient assistance medication. Pt is aware that medication is ready for pick up.   Ozempic: 4 boxes

## 2022-01-31 ENCOUNTER — Other Ambulatory Visit: Payer: Self-pay | Admitting: Internal Medicine

## 2022-01-31 DIAGNOSIS — G4700 Insomnia, unspecified: Secondary | ICD-10-CM

## 2022-02-04 ENCOUNTER — Ambulatory Visit: Payer: Self-pay | Admitting: Pharmacist

## 2022-02-04 NOTE — Chronic Care Management (AMB) (Signed)
?  Chronic Care Management  ? ?Note ? ?02/04/2022 ?Name: Roshana Shuffield MRN: [id number] DOB: 03/30/1951 ? ? ? ?Closing pharmacy CCM case at this time. Will collaborate with Care Guide to outreach to schedule follow up with RN CM. Patient has clinic contact information for future questions or concerns.  ? ?[name], PharmD, [place], CPP ?Clinical Pharmacist ?Therapist, music at [employer] ?[phone] ? ?

## 2022-02-06 ENCOUNTER — Ambulatory Visit: Payer: Medicare Other

## 2022-02-13 ENCOUNTER — Ambulatory Visit
Admission: RE | Admit: 2022-02-13 | Discharge: 2022-02-13 | Disposition: A | Discharge status: Home / Self Care | Payer: Medicare Other | Source: Ambulatory Visit | Attending: Internal Medicine | Admitting: Internal Medicine

## 2022-02-13 DIAGNOSIS — Z1231 Encounter for screening mammogram for malignant neoplasm of breast: Secondary | ICD-10-CM | POA: Diagnosis not present

## 2022-02-14 ENCOUNTER — Ambulatory Visit (INDEPENDENT_AMBULATORY_CARE_PROVIDER_SITE_OTHER): Payer: Medicare Other

## 2022-02-14 VITALS — BP 128/62 | Ht 67.0 in | Wt 218.0 lb

## 2022-02-14 DIAGNOSIS — Z0000 Encounter for general adult medical examination without abnormal findings: Secondary | ICD-10-CM | POA: Diagnosis not present

## 2022-02-14 NOTE — Progress Notes (Addendum)
Subjective:   Katie Huber is a 71 y.o. female who presents for Medicare Annual (Subsequent) preventive examination.  Review of Systems    No ROS.  Medicare Wellness Virtual Visit.  Visual/audio telehealth visit, UTA vital signs.   See social history for additional risk factors.   Cardiac Risk Factors include: advanced age (>57men, >56 women)     Objective:    Today's Vitals   02/14/22 1206  BP: 128/62  Weight: 218 lb (98.9 kg)  Height: 5\' 7"  (1.702 m)   Body mass index is 34.14 kg/m.  Advanced Directives 02/14/2022 11/12/2021 02/05/2021 03/19/2020 02/02/2020 01/28/2019 03/30/2018  Does Patient Have a Medical Advance Directive? Yes Yes Yes No Yes Yes Yes  Type of Estate agent of [place];Living will Healthcare Power of [place];Living will Healthcare Power of [place];Living will - Healthcare Power of [place];Living will Healthcare Power of [place];Living will -  Does patient want to make changes to medical advance directive? No - Patient declined No - Patient declined No - Patient declined - No - Patient declined No - Patient declined -  Copy of Healthcare Power of Attorney in Chart? No - copy requested No - copy requested No - copy requested - No - copy requested No - copy requested -  Pre-existing out of facility DNR order (yellow form or pink MOST form) - - - - - - Yellow form placed in chart (order not valid for inpatient use)    Current Medications (verified) Outpatient Encounter Medications as of 02/14/2022  Medication Sig   albuterol (PROVENTIL HFA;VENTOLIN HFA) 108 (90 Base) MCG/ACT inhaler Inhale 2 puffs into the lungs every 6 (six) hours as needed for wheezing or shortness of breath.   albuterol (PROVENTIL) (2.5 MG/3ML) 0.083% nebulizer solution Take 3 mLs (2.5 mg total) by nebulization every 6 (six) hours as needed for wheezing or shortness of breath.   amLODipine (NORVASC) 10 MG tablet TAKE 1 TABLET BY MOUTH DAILY   atorvastatin (LIPITOR) 20 MG  tablet TAKE ONE TABLET EVERY DAY   furosemide (LASIX) 20 MG tablet TAKE ONE TABLET EVERY DAY AS NEEDED FOR FLUID RETENTION   gabapentin (NEURONTIN) 300 MG capsule TAKE 2 CAPSULES 3 TIMES DAILY   losartan (COZAAR) 100 MG tablet TAKE ONE TABLET EVERY DAY   montelukast (SINGULAIR) 10 MG tablet Take 1 tablet (10 mg total) by mouth at bedtime.   omeprazole (PRILOSEC) 20 MG capsule TAKE 1 CAPSULE TWICE DAILY   PARoxetine (PAXIL) 20 MG tablet TAKE ONE TABLET EVERY MORNING   promethazine (PHENERGAN) 25 MG tablet TAKE 1 TABLET BY MOUTH  EVERY 8 HOURS AS NEEDED FOR NAUSEA AND VOMITING   triamterene-hydrochlorothiazide (MAXZIDE-25) 37.5-25 MG tablet TAKE 1 TABLET BY MOUTH DAILY   zolpidem (AMBIEN) 10 MG tablet TAKE 1 TABLET BY MOUTH AT BEDTIME AS NEEDED FOR SLEEP.   [DISCONTINUED] acyclovir (ZOVIRAX) 400 MG tablet TAKE ONE TABLET BY MOUTH  FIVE TIMES DAILY   [DISCONTINUED] Continuous Blood Gluc Sensor (FREESTYLE LIBRE 2 SENSOR) MISC Apply 1 each topically every 14 (fourteen) days.   [DISCONTINUED] potassium chloride SA (KLOR-CON) 20 MEQ tablet TAKE 1 TABLET BY MOUTH  DAILY WHEN USING FUROSEMIDE (Patient taking differently: TAKE 1 TABLET BY MOUTH  DAILY WHEN USING FUROSEMIDE)   [DISCONTINUED] tiZANidine (ZANAFLEX) 4 MG tablet TAKE ONE TABLET BY MOUTH EVERY 6 HOURS AS NEEDED FOR MUSCLE SPASM   Facility-Administered Encounter Medications as of 02/14/2022  Medication   pneumococcal 13-valent conjugate vaccine (PREVNAR 13) injection 0.5 mL    Allergies (verified)  Patient has no known allergies.   History: Past Medical History:  Diagnosis Date   Asthma 1990   Hypertension    Obesity, Class III, BMI 40-49.9 (morbid obesity) (HCC)    weight fluctuations of 100 lbs    Past Surgical History:  Procedure Laterality Date   ABDOMINAL HYSTERECTOMY  2001   BREAST BIOPSY Right    Neg   BREAST EXCISIONAL BIOPSY Right 1990s   multiple hematomas   CHOLECYSTECTOMY  1970s   COLONOSCOPY WITH PROPOFOL N/A  03/30/2018   Procedure: COLONOSCOPY WITH PROPOFOL;  Surgeon: Midge Minium, MD;  Location: [facility] ENDOSCOPY;  Service: Endoscopy;  Laterality: N/A;   JOINT REPLACEMENT     Bilateral   STOMACH SURGERY  [phone]   gastric partitionin( for obesity)    Family History  Problem Relation Age of Onset   Heart disease Father    COPD Father    Hyperlipidemia Father    Mental illness Brother    Breast cancer Neg Hx    Social History   Socioeconomic History   Marital status: Single    Spouse name: Not on file   Number of children: Not on file   Years of education: Not on file   Highest education level: Not on file  Occupational History   Not on file  Tobacco Use   Smoking status: Never   Smokeless tobacco: Never  Vaping Use   Vaping Use: Never used  Substance and Sexual Activity   Alcohol use: Yes    Comment: ocassionally   Drug use: No   Sexual activity: Never  Other Topics Concern   Not on file  Social History Narrative   Not on file   Social Determinants of Health   Financial Resource Strain: Medium Risk   Difficulty of Paying Living Expenses: Somewhat hard  Food Insecurity: No Food Insecurity   Worried About Running Out of Food in the Last Year: Never true   Ran Out of Food in the Last Year: Never true  Transportation Needs: No Transportation Needs   Lack of Transportation (Medical): No   Lack of Transportation (Non-Medical): No  Physical Activity: Sufficiently Active   Days of Exercise per Week: 5 days   Minutes of Exercise per Session: 30 min  Stress: No Stress Concern Present   Feeling of Stress : Not at all  Social Connections: Unknown   Frequency of Communication with Friends and Family: More than three times a week   Frequency of Social Gatherings with Friends and Family: More than three times a week   Attends Religious Services: More than 4 times per year   Active Member of [employer] or Organizations: Yes   Attends Banker Meetings: Not on file   Marital  Status: Not on file    Tobacco Counseling Counseling given: Not Answered   Clinical Intake:  Pre-visit preparation completed: Yes        Diabetes: No  How often do you need to have someone help you when you read instructions, pamphlets, or other written materials from your doctor or pharmacy?: 1 - Never    Interpreter Needed?: No      Activities of Daily Living In your present state of health, do you have any difficulty performing the following activities: 02/14/2022  Hearing? N  Vision? N  Difficulty concentrating or making decisions? N  Walking or climbing stairs? N  Dressing or bathing? N  Doing errands, shopping? N  Preparing Food and eating ? N  Using the Toilet?  N  In the past six months, have you accidently leaked urine? N  Do you have problems with loss of bowel control? N  Managing your Medications? N  Managing your Finances? N  Housekeeping or managing your Housekeeping? N  Some recent data might be hidden    Patient Care Team: Sherlene Shams, MD as PCP - General (Internal Medicine)  Indicate any recent Medical Services you may have received from other than [facility] providers in the past year (date may be approximate).     Assessment:   This is a routine wellness examination for Katie Huber.  Virtual Visit via Telephone Note  I connected with  Katie Huber on 02/14/22 at  3:30 PM EDT by telephone and verified that I am speaking with the correct person using two identifiers.  Persons participating in the virtual visit: patient/Nurse Health Advisor   I discussed the limitations of evaluation and management service by telehealth. The patient expressed understanding and agreed to proceed. We continued and completed visit with audio only.  Some vital signs may be absent or patient reported.   Hearing/Vision screen Hearing Screening - Comments:: Patient is able to hear conversational tones without difficulty. No issues reported. Vision Screening -  Comments:: Wears corrective lenses They have seen their ophthalmologist.  Followed by Dr. Damian Leavell.   Dietary issues and exercise activities discussed: Current Exercise Habits: Home exercise routine, Type of exercise: walking, Time (Minutes): 30, Frequency (Times/Week): 5, Weekly Exercise (Minutes/Week): 150, Intensity: Mild Healthy diet Low carb diet Good water intake   Goals Addressed               This Visit's Progress     Patient Stated     Increase physical activity (pt-stated)        Walk for exercise       Depression Screen PHQ 2/9 Scores 02/14/2022 10/16/2021 08/29/2021 06/11/2021 02/05/2021 02/02/2020 10/25/2019  PHQ - 2 Score 0 0 0 6 0 0 0  PHQ- 9 Score - 1 - 27 - - -    Fall Risk Fall Risk  02/14/2022 10/16/2021 08/29/2021 07/11/2021 06/11/2021  Falls in the past year? 0 0 0 0 0  Comment - - - - -  Number falls in past yr: - - - - -  Injury with Fall? - - - - -  Risk for fall due to : - No Fall Risks - - No Fall Risks  Follow up Falls evaluation completed Falls evaluation completed Falls evaluation completed Falls evaluation completed Falls evaluation completed    FALL RISK PREVENTION PERTAINING TO THE HOME: Home free of loose throw rugs in walkways, pet beds, electrical cords, etc? Yes  Adequate lighting in your home to reduce risk of falls? Yes   ASSISTIVE DEVICES UTILIZED TO PREVENT FALLS: Life alert? No  Use of a cane, walker or w/c? No   TIMED UP AND GO: Was the test performed? No .   Cognitive Function: Patient is alert and oriented x3.  Normal cognitive status assessed by direct observation/communication No abnormalities found.   MMSE - Mini Mental State Exam 01/28/2019  Orientation to time 5  Orientation to Place 5  Registration 3  Attention/ Calculation 5  Recall 3  Language- name 2 objects 2  Language- repeat 1  Language- follow 3 step command 3  Language- read & follow direction 1  Write a sentence 1  Copy design 1  Total score 30     6CIT  Screen 02/02/2020 09/09/2017  What Year?  0 points 0 points  What month? 0 points 0 points  What time? 0 points 0 points  Count back from 20 - 0 points  Months in reverse - 0 points  Repeat phrase - 0 points  Total Score - 0    Immunizations Immunization History  Administered Date(s) Administered   Fluad Quad(high Dose 65+) 08/31/2019, 09/03/2020   Influenza, High Dose Seasonal PF 09/09/2017, 02/09/2019   Influenza-Unspecified 09/27/2021   PFIZER(Purple Top)SARS-COV-2 Vaccination 01/26/2020, 02/16/2020, 10/11/2020   Pneumococcal Conjugate-13 02/06/2015   Pneumococcal Polysaccharide-23 09/20/2010, 09/09/2017   Tdap 09/20/2013   Zoster Recombinat (Shingrix) 12/12/2020, 04/04/2021   Zoster, Live 06/13/2014   Covid-19 vaccine status: Completed vaccines x3.  Screening Tests Health Maintenance  Topic Date Due   MAMMOGRAM  01/08/2022   HEMOGLOBIN A1C  05/01/2022   OPHTHALMOLOGY EXAM  06/20/2022   FOOT EXAM  10/16/2022   COLONOSCOPY (Pts 45-72yrs Insurance coverage will need to be confirmed)  03/31/2023   TETANUS/TDAP  09/21/2023   Pneumonia Vaccine 23+ Years old  Completed   INFLUENZA VACCINE  Completed   DEXA SCAN  Completed   Hepatitis C Screening  Completed   Zoster Vaccines- Shingrix  Completed   HPV VACCINES  Aged Out   COVID-19 Vaccine  Discontinued   Fecal DNA (Cologuard)  Discontinued   Health Maintenance Health Maintenance Due  Topic Date Due   MAMMOGRAM  01/08/2022   Lung Cancer Screening: (Low Dose CT Chest recommended if Age 70-80 years, 30 pack-year currently smoking OR have quit w/in 15years.) does not qualify.   Vision Screening: Recommended annual ophthalmology exams for early detection of glaucoma and other disorders of the eye.  Dental Screening: Recommended annual dental exams for proper oral hygiene  Community Resource Referral / Chronic Care Management: CRR required this visit?  No   CCM required this visit?  No      Plan:   Keep all routine  maintenance appointments.   I have personally reviewed and noted the following in the patient's chart:   Medical and social history Use of alcohol, tobacco or illicit drugs  Current medications and supplements including opioid prescriptions.  Functional ability and status Nutritional status Physical activity Advanced directives List of other physicians Hospitalizations, surgeries, and ER visits in previous 12 months Vitals Screenings to include cognitive, depression, and falls Referrals and appointments  In addition, I have reviewed and discussed with patient certain preventive protocols, quality metrics, and best practice recommendations. A written personalized care plan for preventive services as well as general preventive health recommendations were provided to patient.     OBrien-Blaney, Maeva Dant L, LPN   7/84/6962     I have reviewed the above information and agree with above.   Duncan Dull, MD

## 2022-02-14 NOTE — Patient Instructions (Addendum)
?  Katie Huber , ?Thank you for taking time to come for your Medicare Wellness Visit. I appreciate your ongoing commitment to your health goals. Please review the following plan we discussed and let me know if I can assist you in the future.  ? ?These are the goals we discussed: ? Goals   ? ?  ? Patient Stated  ?   Increase physical activity (pt-stated)   ?   Walk for exercise ?  ? ?  ?  ?This is a list of the screening recommended for you and due dates:  ?Health Maintenance  ?Topic Date Due  ? Mammogram  01/08/2022  ? Hemoglobin A1C  05/01/2022  ? Eye exam for diabetics  06/20/2022  ? Complete foot exam   10/16/2022  ? Colon Cancer Screening  03/31/2023  ? Tetanus Vaccine  09/21/2023  ? Pneumonia Vaccine  Completed  ? Flu Shot  Completed  ? DEXA scan (bone density measurement)  Completed  ? Hepatitis C Screening: USPSTF Recommendation to screen - Ages 63-79 yo.  Completed  ? Zoster (Shingles) Vaccine  Completed  ? HPV Vaccine  Aged Out  ? COVID-19 Vaccine  Discontinued  ? Cologuard (Stool DNA test)  Discontinued  ?  ?

## 2022-03-03 ENCOUNTER — Telehealth: Payer: Self-pay

## 2022-03-03 NOTE — Chronic Care Management (AMB) (Signed)
?  Chronic Care Management  ? ?Note ? ?03/03/2022 ?Name: Katie Huber MRN: [id number] DOB: Apr 28, 1951 ? ?Katie Huber is a 71 y.o. year old female who is a primary care patient of Derrel Nip, Aris Everts, MD. Katie Huber is currently enrolled in care management services. An additional referral for rncm was placed.  ? ?Follow up plan: ?Patient declines further follow up and engagement by the care management team. Appropriate care team members and provider have been notified via electronic communication.  ? ?Noreene Larsson, RMA ?Care Guide, Embedded Care Coordination ?[facility]  ?[place], [place] [address] ?Direct Dial: [phone] ?[email or web address] ?[email or web address]: [email or web address]  ? ?

## 2022-03-10 ENCOUNTER — Other Ambulatory Visit: Payer: Self-pay

## 2022-03-10 MED ORDER — ALBUTEROL SULFATE HFA 108 (90 BASE) MCG/ACT IN AERS: 2.0000 | INHALATION_SPRAY | Freq: Four times a day (QID) | RESPIRATORY_TRACT | 1 refills | Status: DC | PRN

## 2022-03-11 ENCOUNTER — Other Ambulatory Visit: Payer: Self-pay | Admitting: Internal Medicine

## 2022-03-12 ENCOUNTER — Other Ambulatory Visit: Payer: Self-pay | Admitting: Internal Medicine

## 2022-03-13 ENCOUNTER — Telehealth: Payer: Medicare Other

## 2022-03-25 ENCOUNTER — Other Ambulatory Visit: Payer: Self-pay | Admitting: Internal Medicine

## 2022-04-01 ENCOUNTER — Ambulatory Visit: Payer: Medicare Other | Admitting: Internal Medicine

## 2022-04-08 ENCOUNTER — Ambulatory Visit: Payer: Medicare Other | Admitting: Internal Medicine

## 2022-04-11 ENCOUNTER — Other Ambulatory Visit: Payer: Self-pay | Admitting: Internal Medicine

## 2022-04-15 ENCOUNTER — Telehealth: Payer: Self-pay

## 2022-04-15 ENCOUNTER — Encounter: Payer: Self-pay | Admitting: Internal Medicine

## 2022-04-15 ENCOUNTER — Ambulatory Visit: Payer: Medicare Other | Admitting: Internal Medicine

## 2022-04-15 VITALS — BP 140/70 | HR 68 | Temp 97.8°F | Resp 16 | Ht 67.0 in | Wt 219.0 lb

## 2022-04-15 DIAGNOSIS — R0602 Shortness of breath: Secondary | ICD-10-CM | POA: Diagnosis not present

## 2022-04-15 DIAGNOSIS — J4520 Mild intermittent asthma, uncomplicated: Secondary | ICD-10-CM | POA: Diagnosis not present

## 2022-04-15 DIAGNOSIS — G4733 Obstructive sleep apnea (adult) (pediatric): Secondary | ICD-10-CM | POA: Diagnosis not present

## 2022-04-15 DIAGNOSIS — Z9989 Dependence on other enabling machines and devices: Secondary | ICD-10-CM

## 2022-04-15 NOTE — Progress Notes (Signed)
[facility] [place], [place] [address]  Pulmonary Sleep Medicine   Office Visit Note  Patient Name: Katie Huber DOB: 30-May-1951 MRN [id number]  Date of Service: 04/15/2022  Complaints/HPI: OSA, Asthma. She has lost a lot of weight. She is down 110 pounds since her last visit. Patient feels better has not been sick. She states she is not using the CPAP as much. She would like to try to do a home study to see if still needed. Asthma has been under control. Not using inhalers as much.  Because of the significant weight loss which is over 100 pounds she feels as though her sleep apnea has improved I would recommend getting a follow-up sleep study done on her to reassess  ROS  General: (-) fever, (-) chills, (-) night sweats, (-) weakness Skin: (-) rashes, (-) itching,. Eyes: (-) visual changes, (-) redness, (-) itching. Nose and Sinuses: (-) nasal stuffiness or itchiness, (-) postnasal drip, (-) nosebleeds, (-) sinus trouble. Mouth and Throat: (-) sore throat, (-) hoarseness. Neck: (-) swollen glands, (-) enlarged thyroid, (-) neck pain. Respiratory: - cough, (-) bloody sputum, - shortness of breath, - wheezing. Cardiovascular: - ankle swelling, (-) chest pain. Lymphatic: (-) lymph node enlargement. Neurologic: (-) numbness, (-) tingling. Psychiatric: (-) anxiety, (-) depression   Current Medication: Outpatient Encounter Medications as of 04/15/2022  Medication Sig   albuterol (PROVENTIL) (2.5 MG/3ML) 0.083% nebulizer solution Take 3 mLs (2.5 mg total) by nebulization every 6 (six) hours as needed for wheezing or shortness of breath.   albuterol (VENTOLIN HFA) 108 (90 Base) MCG/ACT inhaler Inhale 2 puffs into the lungs every 6 (six) hours as needed for wheezing or shortness of breath.   amLODipine (NORVASC) 10 MG tablet TAKE 1 TABLET BY MOUTH DAILY   atorvastatin (LIPITOR) 20 MG tablet TAKE ONE TABLET EVERY DAY   furosemide (LASIX) 20 MG tablet TAKE ONE  TABLET EVERY DAY AS NEEDED FOR FLUID RETENTION   gabapentin (NEURONTIN) 300 MG capsule TAKE 2 CAPSULES 3 TIMES DAILY   losartan (COZAAR) 100 MG tablet TAKE ONE TABLET EVERY DAY   montelukast (SINGULAIR) 10 MG tablet TAKE 1 TABLET BY MOUTH AT BEDTIME.   omeprazole (PRILOSEC) 20 MG capsule TAKE 1 CAPSULE BY MOUTH TWICE DAILY   PARoxetine (PAXIL) 20 MG tablet TAKE ONE TABLET EVERY MORNING   promethazine (PHENERGAN) 25 MG tablet TAKE 1 TABLET BY MOUTH  EVERY 8 HOURS AS NEEDED FOR NAUSEA AND VOMITING   triamterene-hydrochlorothiazide (MAXZIDE-25) 37.5-25 MG tablet TAKE 1 TABLET BY MOUTH DAILY   zolpidem (AMBIEN) 10 MG tablet TAKE 1 TABLET BY MOUTH AT BEDTIME AS NEEDED FOR SLEEP.   Facility-Administered Encounter Medications as of 04/15/2022  Medication   pneumococcal 13-valent conjugate vaccine (PREVNAR 13) injection 0.5 mL    Surgical History: Past Surgical History:  Procedure Laterality Date   ABDOMINAL HYSTERECTOMY  2001   BREAST BIOPSY Right    Neg   BREAST EXCISIONAL BIOPSY Right 1990s   multiple hematomas   CHOLECYSTECTOMY  1970s   COLONOSCOPY WITH PROPOFOL N/A 03/30/2018   Procedure: COLONOSCOPY WITH PROPOFOL;  Surgeon: Lucilla Lame, MD;  Location: [facility] ENDOSCOPY;  Service: Endoscopy;  Laterality: N/A;   JOINT REPLACEMENT     Bilateral   STOMACH SURGERY  [phone]   gastric partitionin( for obesity)     Medical History: Past Medical History:  Diagnosis Date   Asthma 1990   Hypertension    Obesity, Class III, BMI 40-49.9 (morbid obesity) ([place])  weight fluctuations of 100 lbs     Family History: Family History  Problem Relation Age of Onset   Heart disease Father    COPD Father    Hyperlipidemia Father    Mental illness Brother    Breast cancer Neg Hx     Social History: Social History   Socioeconomic History   Marital status: Single    Spouse name: Not on file   Number of children: Not on file   Years of education: Not on file   Highest education level: Not on  file  Occupational History   Not on file  Tobacco Use   Smoking status: Never   Smokeless tobacco: Never  Vaping Use   Vaping Use: Never used  Substance and Sexual Activity   Alcohol use: Yes    Comment: ocassionally   Drug use: No   Sexual activity: Never  Other Topics Concern   Not on file  Social History Narrative   Not on file   Social Determinants of Health   Financial Resource Strain: Medium Risk   Difficulty of Paying Living Expenses: Somewhat hard  Food Insecurity: No Food Insecurity   Worried About Charity fundraiser in the Last Year: Never true   Ran Out of Food in the Last Year: Never true  Transportation Needs: No Transportation Needs   Lack of Transportation (Medical): No   Lack of Transportation (Non-Medical): No  Physical Activity: Sufficiently Active   Days of Exercise per Week: 5 days   Minutes of Exercise per Session: 30 min  Stress: No Stress Concern Present   Feeling of Stress : Not at all  Social Connections: Unknown   Frequency of Communication with Friends and Family: More than three times a week   Frequency of Social Gatherings with Friends and Family: More than three times a week   Attends Religious Services: More than 4 times per year   Active Member of [employer] or Organizations: Yes   Attends Music therapist: Not on file   Marital Status: Not on file  Intimate Partner Violence: Not At Risk   Fear of Current or Ex-Partner: No   Emotionally Abused: No   Physically Abused: No   Sexually Abused: No    Vital Signs: Blood pressure 140/70, pulse 68, temperature 97.8 F (36.6 C), resp. rate 16, height '5\' 7"'$  (1.702 m), weight 219 lb (99.3 kg), SpO2 97 %.  Examination: General Appearance: The patient is well-developed, well-nourished, and in no distress. Skin: Gross inspection of skin unremarkable. Head: normocephalic, no gross deformities. Eyes: no gross deformities noted. ENT: ears appear grossly normal no exudates. Neck: Supple.  No thyromegaly. No LAD. Respiratory: no rhocnhi. Cardiovascular: Normal S1 and S2 without murmur or rub. Extremities: No cyanosis. pulses are equal. Neurologic: Alert and oriented. No involuntary movements.  LABS: No results found for this or any previous visit (from the past 2160 hour(s)).  Radiology: MM 3D SCREEN BREAST BILATERAL  Result Date: 02/14/2022 CLINICAL DATA:  Screening. EXAM: DIGITAL SCREENING BILATERAL MAMMOGRAM WITH TOMOSYNTHESIS AND CAD TECHNIQUE: Bilateral screening digital craniocaudal and mediolateral oblique mammograms were obtained. Bilateral screening digital breast tomosynthesis was performed. The images were evaluated with computer-aided detection. Best images possible per technologist communication. COMPARISON:  Previous exam(s). ACR Breast Density Category b: There are scattered areas of fibroglandular density. FINDINGS: There are no findings suspicious for malignancy. IMPRESSION: No mammographic evidence of malignancy. A result letter of this screening mammogram will be mailed directly to the patient. RECOMMENDATION: Screening  mammogram in one year. (Code:SM-B-01Y) BI-RADS CATEGORY  1: Negative. Electronically Signed   By: Valentino Saxon M.D.   On: 02/14/2022 13:13    No results found.  No results found.    Assessment and Plan: Patient Active Problem List   Diagnosis Date Noted   Hypercalcemia 06/17/2021   Major depressive disorder, recurrent episode with anxious distress ([place]) 06/11/2021   Onychomycosis of right great toe 03/04/2021   Coronary artery calcification of native artery 11/10/2020   Abdominal aortic atherosclerosis ([place]) 11/10/2020   Oral cavity pain 01/09/2020   Vitamin D deficiency 01/09/2020   Diarrhea, functional 07/26/2018   Tubular adenoma of colon 04/01/2018   Benign neoplasm of cecum    Encounter for screening colonoscopy    Rectal polyp    B12 deficiency 01/26/2018   Chronic respiratory failure with hypoxia ([place]) 12/11/2017    Hospital discharge follow-up 03/14/2017   Mild intermittent asthma 03/04/2017   Herpes simplex infection 08/16/2015   Skin neoplasm 08/16/2015   Pulmonary hypertension ([place]) 02/06/2015   S/P vaginal hysterectomy 06/13/2014   Encounter for preventive health examination 06/13/2014   Essential hypertension, benign 12/27/2013   S/P bariatric surgery 09/20/2013   Insomnia due to anxiety and fear 06/14/2013   Type 2 diabetes mellitus with obesity ([place]) 03/02/2013   Hyperlipidemia associated with type 2 diabetes mellitus ([place]) 12/17/2011   Encounter for long-term (current) use of other medications 12/17/2011   Cervicalgia 12/16/2011   Sleep apnea 12/16/2011   1. SOB (shortness of breath) Improving has baseline history of asthma continue with inhaler regimen - Spirometry with Graph - Pulmonary Function Test; Future  2. Mild intermittent asthma without complication This is under control patient has baseline history she is on albuterol using it as needed has actually done quite well  3. OSA on CPAP Ordered a repeat sleep study to be done patient has had over 100 pounds of weight loss I suspect her baseline PSG should show some improvement if she is still is considered obese however pressure requirements may have gone down - PSG Sleep Study; Future  4. Morbid obesity ([place]) Continues to do well with weight loss continue with diet and exercise as tolerated  General Counseling: I have discussed the findings of the evaluation and examination with Jennifier.  I have also discussed any further diagnostic evaluation thatmay be needed or ordered today. Arlana verbalizes understanding of the findings of todays visit. We also reviewed her medications today and discussed drug interactions and side effects including but not limited excessive drowsiness and altered mental states. We also discussed that there is always a risk not just to her but also people around her. she has been encouraged to call the office  with any questions or concerns that should arise related to todays visit.  Orders Placed This Encounter  Procedures   Spirometry with Graph    Order Specific Question:   Where should this test be performed?    Answer:   [facility]   Pulmonary Function Test    Standing Status:   Future    Standing Expiration Date:   04/16/2023    Order Specific Question:   Where should this test be performed?    Answer:   [facility]   PSG Sleep Study    Standing Status:   Future    Standing Expiration Date:   04/16/2023    Order Specific Question:   Where should this test be performed:    Answer:   [facility]     Time spent: 26  I have personally obtained a history, examined the patient, evaluated laboratory and imaging results, formulated the assessment and plan and placed orders.    Allyne Gee, MD [facility] Pulmonary and Critical Care Sleep medicine

## 2022-04-15 NOTE — Patient Instructions (Signed)
Sleep Apnea ?Sleep apnea affects breathing during sleep. It causes breathing to stop for 10 seconds or more, or to become shallow. People with sleep apnea usually snore loudly. ?It can also increase the risk of: ?Heart attack. ?Stroke. ?Being very overweight (obese). ?Diabetes. ?Heart failure. ?Irregular heartbeat. ?High blood pressure. ?The goal of treatment is to help you breathe normally again. ?What are the causes? ? ?The most common cause of this condition is a collapsed or blocked airway. ?There are three kinds of sleep apnea: ?Obstructive sleep apnea. This is caused by a blocked or collapsed airway. ?Central sleep apnea. This happens when the brain does not send the right signals to the muscles that control breathing. ?Mixed sleep apnea. This is a combination of obstructive and central sleep apnea. ?What increases the risk? ?Being overweight. ?Smoking. ?Having a small airway. ?Being older. ?Being female. ?Drinking alcohol. ?Taking medicines to calm yourself (sedatives or tranquilizers). ?Having family members with the condition. ?Having a tongue or tonsils that are larger than normal. ?What are the signs or symptoms? ?Trouble staying asleep. ?Loud snoring. ?Headaches in the morning. ?Waking up gasping. ?Dry mouth or sore throat in the morning. ?Being sleepy or tired during the day. ?If you are sleepy or tired during the day, you may also: ?Not be able to focus your mind (concentrate). ?Forget things. ?Get angry a lot and have mood swings. ?Feel sad (depressed). ?Have changes in your personality. ?Have less interest in sex, if you are female. ?Be unable to have an erection, if you are female. ?How is this treated? ? ?Sleeping on your side. ?Using a medicine to get rid of mucus in your nose (decongestant). ?Avoiding the use of alcohol, medicines to help you relax, or certain pain medicines (narcotics). ?Losing weight, if needed. ?Changing your diet. ?Quitting smoking. ?Using a machine to open your airway while you  sleep, such as: ?An oral appliance. This is a mouthpiece that shifts your lower jaw forward. ?A CPAP device. This device blows air through a mask when you breathe out (exhale). ?An EPAP device. This has valves that you put in each nostril. ?A BIPAP device. This device blows air through a mask when you breathe in (inhale) and breathe out. ?Having surgery if other treatments do not work. ?Follow these instructions at home: ?Lifestyle ?Make changes that your doctor recommends. ?Eat a healthy diet. ?Lose weight if needed. ?Avoid alcohol, medicines to help you relax, and some pain medicines. ?Do not smoke or use any products that contain nicotine or tobacco. If you need help quitting, ask your doctor. ?General instructions ?Take over-the-counter and prescription medicines only as told by your doctor. ?If you were given a machine to use while you sleep, use it only as told by your doctor. ?If you are having surgery, make sure to tell your doctor you have sleep apnea. You may need to bring your device with you. ?Keep all follow-up visits. ?Contact a doctor if: ?The machine that you were given to use during sleep bothers you or does not seem to be working. ?You do not get better. ?You get worse. ?Get help right away if: ?Your chest hurts. ?You have trouble breathing in enough air. ?You have an uncomfortable feeling in your back, arms, or stomach. ?You have trouble talking. ?One side of your body feels weak. ?A part of your face is hanging down. ?These symptoms may be an emergency. Get help right away. Call your local emergency services (911 in the [place] ?Do not wait to see if the   symptoms will go away. ?Do not drive yourself to the hospital. ?Summary ?This condition affects breathing during sleep. ?The most common cause is a collapsed or blocked airway. ?The goal of treatment is to help you breathe normally while you sleep. ?This information is not intended to replace advice given to you by your health care provider. Make  sure you discuss any questions you have with your health care provider. ?Document Revised: 06/26/2021 Document Reviewed: 10/26/2020 ?Elsevier Patient Education ? Katie Huber. ?Asthma, Adult ? ?Asthma is a condition that causes swelling and narrowing of the airways. These are the passages that lead from the nose and mouth down into the lungs. When asthma symptoms get worse it is called an asthma attack or flare. This can make it hard to breathe. Asthma flares can range from minor to life-threatening. There is no cure for asthma, but medicines and lifestyle changes can help to control it. ?What are the causes? ?It is not known exactly what causes asthma, but certain things can cause asthma symptoms to get worse (triggers). ?What can trigger an asthma attack? ?Cigarette smoke. ?Mold. ?Dust. ?Your pet's skin flakes (dander). ?Cockroaches. ?Pollen. ?Air pollution (like household cleaners, wood smoke, smog, or Advertising account planner). ?What are the signs or symptoms? ?Trouble breathing (shortness of breath). ?Coughing. ?Making high-pitched whistling sounds when you breathe, most often when you breathe out (wheezing). ?Chest tightness. ?Tiredness with little activity. ?Poor exercise tolerance. ?How is this treated? ?Controller medicines that help prevent asthma symptoms. ?Fast-acting reliever or rescue medicines. These give short-term relief of asthma symptoms. ?Allergy medicines if your attacks are brought on by allergens. ?Medicines to help control the body's defense (immune) system. ?Staying away from the things that cause asthma attacks. ?Follow these instructions at home: ?Avoiding triggers in your home ?Do not allow anyone to smoke in your home. ?Limit use of fireplaces and wood stoves. ?Get rid of pests (such as roaches and mice) and their droppings. ?Keep your home clean. ?Clean your floors. Dust regularly. Use cleaning products that do not smell. ?Wash bed sheets and blankets every week in hot water. Dry them in a  dryer. ?Have someone vacuum when you are not home. ?Change your heating and air conditioning filters often. ?Use blankets that are made of polyester or cotton. ?General instructions ?Take over-the-counter and prescription medicines only as told by your doctor. ?Do not smoke or use any products that contain nicotine or tobacco. If you need help quitting, ask your doctor. ?Stay away from secondhand smoke. ?Avoid doing things outdoors when allergen counts are high and when air quality is low. ?Warm up before you exercise. Take time to cool down after exercise. ?Use a peak flow meter as told by your doctor. A peak flow meter is a tool that measures how well your lungs are working. ?Keep track of the peak flow meter's readings. Write them down. ?Follow your asthma action plan. This is a written plan for taking care of your asthma and treating your attacks. ?Make sure you get all the shots (vaccines) that your doctor recommends. Ask your doctor about a flu shot and a pneumonia shot. ?Keep all follow-up visits. ?Contact a doctor if: ?You have wheezing, shortness of breath, or a cough even while taking medicine to prevent attacks. ?The mucus you cough up (sputum) is thicker than usual. ?The mucus you cough up changes from clear or white to yellow, green, gray, or is bloody. ?You have problems from the medicine you are taking, such as: ?A rash. ?Itching. ?Swelling. ?  Trouble breathing. ?You need reliever medicines more than 2-3 times a week. ?Your peak flow reading is still at 50-79% of your personal best after following the action plan for 1 hour. ?You have a fever. ?Get help right away if: ?You seem to be worse and are not responding to medicine during an asthma attack. ?You are short of breath even at rest. ?You get short of breath when doing very little activity. ?You have trouble eating, drinking, or talking. ?You have chest pain or tightness. ?You have a fast heartbeat. ?Your lips or fingernails start to turn blue. ?You  are light-headed or dizzy, or you faint. ?Your peak flow is less than 50% of your personal best. ?You feel too tired to breathe normally. ?These symptoms may be an emergency. Get help right away. Call

## 2022-04-15 NOTE — Telephone Encounter (Signed)
Awaiting 04/15/22 office notes for SS order-Toni ?

## 2022-04-18 ENCOUNTER — Telehealth: Payer: Self-pay

## 2022-04-18 ENCOUNTER — Ambulatory Visit (INDEPENDENT_AMBULATORY_CARE_PROVIDER_SITE_OTHER): Payer: Medicare Other | Admitting: Internal Medicine

## 2022-04-18 ENCOUNTER — Encounter: Payer: Self-pay | Admitting: Internal Medicine

## 2022-04-18 VITALS — BP 106/58 | HR 66 | Temp 97.7°F | Ht 67.0 in | Wt 217.0 lb

## 2022-04-18 DIAGNOSIS — E785 Hyperlipidemia, unspecified: Secondary | ICD-10-CM

## 2022-04-18 DIAGNOSIS — E1169 Type 2 diabetes mellitus with other specified complication: Secondary | ICD-10-CM | POA: Diagnosis not present

## 2022-04-18 DIAGNOSIS — G4700 Insomnia, unspecified: Secondary | ICD-10-CM

## 2022-04-18 DIAGNOSIS — I700 Atherosclerosis of aorta: Secondary | ICD-10-CM

## 2022-04-18 DIAGNOSIS — E669 Obesity, unspecified: Secondary | ICD-10-CM | POA: Diagnosis not present

## 2022-04-18 DIAGNOSIS — K760 Fatty (change of) liver, not elsewhere classified: Secondary | ICD-10-CM

## 2022-04-18 DIAGNOSIS — I10 Essential (primary) hypertension: Secondary | ICD-10-CM

## 2022-04-18 LAB — HEMOGLOBIN A1C: Hgb A1c MFr Bld: 5.2 % (ref 4.6–6.5)

## 2022-04-18 LAB — COMPREHENSIVE METABOLIC PANEL
ALT: 12 U/L (ref 0–35)
AST: 17 U/L (ref 0–37)
Albumin: 4.3 g/dL (ref 3.5–5.2)
Alkaline Phosphatase: 58 U/L (ref 39–117)
BUN: 14 mg/dL (ref 6–23)
CO2: 27 mEq/L (ref 19–32)
Calcium: 9.9 mg/dL (ref 8.4–10.5)
Chloride: 102 mEq/L (ref 96–112)
Creatinine, Ser: 0.63 mg/dL (ref 0.40–1.20)
GFR: 89.56 mL/min (ref >60.00)
Glucose, Bld: 85 mg/dL (ref 70–99)
Potassium: 4.1 mEq/L (ref 3.5–5.1)
Sodium: 136 mEq/L (ref 135–145)
Total Bilirubin: 0.6 mg/dL (ref 0.2–1.2)
Total Protein: 6.7 g/dL (ref 6.0–8.3)

## 2022-04-18 LAB — LIPID PANEL
Cholesterol: 152 mg/dL (ref 0–200)
HDL: 70.8 mg/dL (ref >39.00)
LDL Cholesterol: 69 mg/dL (ref 0–99)
NonHDL: 81.66
Total CHOL/HDL Ratio: 2
Triglycerides: 64 mg/dL (ref 0.0–149.0)
VLDL: 12.8 mg/dL (ref 0.0–40.0)

## 2022-04-18 LAB — LDL CHOLESTEROL, DIRECT: Direct LDL: 62 mg/dL

## 2022-04-18 MED ORDER — CYCLOBENZAPRINE HCL 10 MG PO TABS: 10.0000 mg | ORAL_TABLET | Freq: Three times a day (TID) | ORAL | 5 refills | Status: DC | PRN

## 2022-04-18 MED ORDER — ZOLPIDEM TARTRATE 10 MG PO TABS: 10.0000 mg | ORAL_TABLET | Freq: Every evening | ORAL | 5 refills | Status: DC | PRN

## 2022-04-18 MED ORDER — FREESTYLE LIBRE 14 DAY SENSOR MISC
3 refills | Status: DC
Start: 2022-04-18 — End: 2023-12-07

## 2022-04-18 MED ORDER — FREESTYLE LIBRE 2 SENSOR MISC
3 refills | Status: DC
Start: 2022-04-18 — End: 2023-05-04

## 2022-04-18 NOTE — Patient Instructions (Signed)
You look fantastic!   Increase the ozempic to 2 mg weekly   Flexeril refilled

## 2022-04-18 NOTE — Telephone Encounter (Signed)
Received pt assistance medication in office today. Pt is aware that it is ready for pick up. Pt will pick up today during office visit.   Ozempic: 4 boxes

## 2022-04-18 NOTE — Assessment & Plan Note (Signed)
She has lost 118 lbs since 2021.  Last a1c was 5.3 in November .   Continue ozempic,  Today we will  increase dose to 2 milligrams weekly

## 2022-04-18 NOTE — Progress Notes (Signed)
Subjective:  Patient ID: Katie Huber, female    DOB: 02-04-1951  Age: 71 y.o. MRN: [id number]  CC: The primary encounter diagnosis was Essential hypertension, benign. Diagnoses of Hyperlipidemia associated with type 2 diabetes mellitus ([place]), Type 2 diabetes mellitus with obesity ([place]), Insomnia, unspecified type, Hepatic steatosis, and Abdominal aortic atherosclerosis ([place]) were also pertinent to this visit.   HPI Katie Huber presents for  Chief Complaint  Patient presents with   Follow-up    6 month follow up on diabetes, hypertension   1) Type 2 DM:  taking ozempic 1 mg weekly weight has plateaued .  Blood sugars are well controlled.  She is tolerating medication without nausea.   2) H/o fattty liver, diagnosed in 2015. Liver enzymes have been normal since then   3) aortic atherosclerosis:  we  Reviewed findings of prior CT scan today..  Patient is tolerating high potency statin therapy    4) Hypertension Patient is taking her medications as prescribed and notes no adverse effects.  Home BP readings have been done about once per week and are  generally < 130/80 .  She is avoiding added salt in her diet and walking regularly about 3 times per week for exercise .  Limited by chronic back and neck pain.      Outpatient Medications Prior to Visit  Medication Sig Dispense Refill   albuterol (PROVENTIL) (2.5 MG/3ML) 0.083% nebulizer solution Take 3 mLs (2.5 mg total) by nebulization every 6 (six) hours as needed for wheezing or shortness of breath. 150 mL 1   albuterol (VENTOLIN HFA) 108 (90 Base) MCG/ACT inhaler Inhale 2 puffs into the lungs every 6 (six) hours as needed for wheezing or shortness of breath. 3 each 1   amLODipine (NORVASC) 10 MG tablet TAKE 1 TABLET BY MOUTH DAILY 90 tablet 1   atorvastatin (LIPITOR) 20 MG tablet TAKE ONE TABLET EVERY DAY 90 tablet 1   furosemide (LASIX) 20 MG tablet TAKE ONE TABLET EVERY DAY AS NEEDED FOR FLUID RETENTION 90 tablet 1   gabapentin  (NEURONTIN) 300 MG capsule TAKE 2 CAPSULES 3 TIMES DAILY 540 capsule 3   losartan (COZAAR) 100 MG tablet TAKE ONE TABLET EVERY DAY 90 tablet 1   montelukast (SINGULAIR) 10 MG tablet TAKE 1 TABLET BY MOUTH AT BEDTIME. 90 tablet 3   omeprazole (PRILOSEC) 20 MG capsule TAKE 1 CAPSULE BY MOUTH TWICE DAILY 180 capsule 3   PARoxetine (PAXIL) 20 MG tablet TAKE ONE TABLET EVERY MORNING 90 tablet 3   promethazine (PHENERGAN) 25 MG tablet TAKE 1 TABLET BY MOUTH  EVERY 8 HOURS AS NEEDED FOR NAUSEA AND VOMITING 90 tablet 0   Semaglutide, 2 MG/DOSE, (OZEMPIC, 2 MG/DOSE,) 8 MG/3ML SOPN Inject 2 mg into the skin once a week.     Sennosides-Docusate Sodium (SENNA PLUS PO) Take 2 tablets by mouth at bedtime.     triamterene-hydrochlorothiazide (MAXZIDE-25) 37.5-25 MG tablet TAKE 1 TABLET BY MOUTH DAILY 90 tablet 0   zolpidem (AMBIEN) 10 MG tablet TAKE 1 TABLET BY MOUTH AT BEDTIME AS NEEDED FOR SLEEP. 30 tablet 1   Facility-Administered Medications Prior to Visit  Medication Dose Route Frequency Provider Last Rate Last Admin   pneumococcal 13-valent conjugate vaccine (PREVNAR 13) injection 0.5 mL  0.5 mL Intramuscular Once [name], MD        Review of Systems;  Patient denies headache, fevers, malaise, unintentional weight loss, skin rash, eye pain, sinus congestion and sinus pain, sore throat, dysphagia,  hemoptysis ,  cough, dyspnea, wheezing, chest pain, palpitations, orthopnea, edema, abdominal pain, nausea, melena, diarrhea, constipation, flank pain, dysuria, hematuria, urinary  Frequency, nocturia, numbness, tingling, seizures,  Focal weakness, Loss of consciousness,  Tremor, insomnia, depression, anxiety, and suicidal ideation.      Objective:  BP (!) 106/58 (BP Location: Left Arm, Patient Position: Sitting, Cuff Size: Large)   Pulse 66   Temp 97.7 F (36.5 C) (Oral)   Ht '5\' 7"'  (1.702 m)   Wt 217 lb (98.4 kg)   SpO2 98%   BMI 33.99 kg/m   BP Readings from Last 3 Encounters:  04/18/22  (!) 106/58  04/15/22 140/70  02/14/22 128/62    Wt Readings from Last 3 Encounters:  04/18/22 217 lb (98.4 kg)  04/15/22 219 lb (99.3 kg)  02/14/22 218 lb (98.9 kg)    General appearance: alert, cooperative and appears stated age Ears: normal TM's and external ear canals both ears Throat: lips, mucosa, and tongue normal; teeth and gums normal Neck: no adenopathy, no carotid bruit, supple, symmetrical, trachea midline and thyroid not enlarged, symmetric, no tenderness/mass/nodules Back: symmetric, no curvature. ROM normal. No CVA tenderness. Lungs: clear to auscultation bilaterally Heart: regular rate and rhythm, S1, S2 normal, no murmur, click, rub or gallop Abdomen: soft, non-tender; bowel sounds normal; no masses,  no organomegaly Pulses: 2+ and symmetric Skin: Skin color, texture, turgor normal. No rashes or lesions Lymph nodes: Cervical, supraclavicular, and axillary nodes normal.  Lab Results  Component Value Date   HGBA1C 5.2 04/18/2022   HGBA1C 5.3 10/31/2021   HGBA1C 5.2 06/11/2021    Lab Results  Component Value Date   CREATININE 0.63 04/18/2022   CREATININE 0.69 10/31/2021   CREATININE 0.57 06/24/2021    Lab Results  Component Value Date   WBC 7.6 03/19/2020   HGB 14.2 03/19/2020   HCT 42.3 03/19/2020   PLT 218 03/19/2020   GLUCOSE 85 04/18/2022   CHOL 152 04/18/2022   TRIG 64.0 04/18/2022   HDL 70.80 04/18/2022   LDLDIRECT 62.0 04/18/2022   LDLCALC 69 04/18/2022   ALT 12 04/18/2022   AST 17 04/18/2022   NA 136 04/18/2022   K 4.1 04/18/2022   CL 102 04/18/2022   CREATININE 0.63 04/18/2022   BUN 14 04/18/2022   CO2 27 04/18/2022   TSH 1.08 06/08/2014   HGBA1C 5.2 04/18/2022   MICROALBUR <0.7 09/03/2020    MM 3D SCREEN BREAST BILATERAL  Result Date: 02/14/2022 CLINICAL DATA:  Screening. EXAM: DIGITAL SCREENING BILATERAL MAMMOGRAM WITH TOMOSYNTHESIS AND CAD TECHNIQUE: Bilateral screening digital craniocaudal and mediolateral oblique mammograms  were obtained. Bilateral screening digital breast tomosynthesis was performed. The images were evaluated with computer-aided detection. Best images possible per technologist communication. COMPARISON:  Previous exam(s). ACR Breast Density Category b: There are scattered areas of fibroglandular density. FINDINGS: There are no findings suspicious for malignancy. IMPRESSION: No mammographic evidence of malignancy. A result letter of this screening mammogram will be mailed directly to the patient. RECOMMENDATION: Screening mammogram in one year. (Code:SM-B-01Y) BI-RADS CATEGORY  1: Negative. Electronically Signed   By: Valentino Saxon M.D.   On: 02/14/2022 13:13    Assessment & Plan:   Problem List Items Addressed This Visit     Hyperlipidemia associated with type 2 diabetes mellitus ([place])   Relevant Orders   Lipid Profile (Completed)   Direct LDL (Completed)   Type 2 diabetes mellitus with obesity ([place])    She has lost 118 lbs since 2021.  Last a1c was 5.3 in  November .   Continue ozempic,  Today we will  increase dose to 2 milligrams weekly        Relevant Orders   HgB A1c (Completed)   Comp Met (CMET) (Completed)   Insomnia    Chronic, with no improvement using over-the-counter first generation antihistamines. Reviewed principles of good sleep hygiene. Although she is a snorer, there is no report of apneic spells by husband. Continue  use of Ambien        Relevant Medications   zolpidem (AMBIEN) 10 MG tablet   Hepatic steatosis    Liver enzymes have been normal since 2015       Essential hypertension, benign - Primary    Well controlled on current regimen. Renal function stable, no changes today.  Lab Results  Component Value Date   CREATININE 0.63 04/18/2022   Lab Results  Component Value Date   NA 136 04/18/2022   K 4.1 04/18/2022   CL 102 04/18/2022   CO2 27 04/18/2022         Relevant Orders   Comp Met (CMET) (Completed)   Abdominal aortic atherosclerosis ([place])     She remains asymptomatic .  Reviewed findings of prior CT scan today..  Patient is  Tolerating tolerating atorvastatin  20 mg  daily for primary prevention   Lab Results  Component Value Date   CHOL 152 04/18/2022   HDL 70.80 04/18/2022   LDLCALC 69 04/18/2022   LDLDIRECT 62.0 04/18/2022   TRIG 64.0 04/18/2022   CHOLHDL 2 04/18/2022          I spent a total of   minutes with this patient in a face to face visit on the date of this encounter reviewing the last office visit with me on        ,  most recent with patient's cardiologist in    ,  patient'ss diet and eating habits, home blood pressure readings ,  most recent imaging study ,   and post visit ordering of testing and therapeutics.    Follow-up: Return in about 3 months (around 07/19/2022) for follow up diabetes.   [name], MD  Subjective:  Patient ID: Katie Huber, female    DOB: 1951/03/14  Age: 71 y.o. MRN: [id number]  CC: The primary encounter diagnosis was Essential hypertension, benign. Diagnoses of Hyperlipidemia associated with type 2 diabetes mellitus ([place]), Type 2 diabetes mellitus with obesity ([place]), Insomnia, unspecified type, Hepatic steatosis, and Abdominal aortic atherosclerosis ([place]) were also pertinent to this visit.   HPI Katie Huber presents for  Chief Complaint  Patient presents with   Follow-up    6 month follow up on diabetes, hypertension      Outpatient Medications Prior to Visit  Medication Sig Dispense Refill   albuterol (PROVENTIL) (2.5 MG/3ML) 0.083% nebulizer solution Take 3 mLs (2.5 mg total) by nebulization every 6 (six) hours as needed for wheezing or shortness of breath. 150 mL 1   albuterol (VENTOLIN HFA) 108 (90 Base) MCG/ACT inhaler Inhale 2 puffs into the lungs every 6 (six) hours as needed for wheezing or shortness of breath. 3 each 1   amLODipine (NORVASC) 10 MG tablet TAKE 1 TABLET BY MOUTH DAILY 90 tablet 1   atorvastatin (LIPITOR) 20 MG tablet TAKE ONE TABLET  EVERY DAY 90 tablet 1   furosemide (LASIX) 20 MG tablet TAKE ONE TABLET EVERY DAY AS NEEDED FOR FLUID RETENTION 90 tablet 1   gabapentin (NEURONTIN) 300 MG capsule TAKE 2 CAPSULES 3  TIMES DAILY 540 capsule 3   losartan (COZAAR) 100 MG tablet TAKE ONE TABLET EVERY DAY 90 tablet 1   montelukast (SINGULAIR) 10 MG tablet TAKE 1 TABLET BY MOUTH AT BEDTIME. 90 tablet 3   omeprazole (PRILOSEC) 20 MG capsule TAKE 1 CAPSULE BY MOUTH TWICE DAILY 180 capsule 3   PARoxetine (PAXIL) 20 MG tablet TAKE ONE TABLET EVERY MORNING 90 tablet 3   promethazine (PHENERGAN) 25 MG tablet TAKE 1 TABLET BY MOUTH  EVERY 8 HOURS AS NEEDED FOR NAUSEA AND VOMITING 90 tablet 0   Semaglutide, 2 MG/DOSE, (OZEMPIC, 2 MG/DOSE,) 8 MG/3ML SOPN Inject 2 mg into the skin once a week.     Sennosides-Docusate Sodium (SENNA PLUS PO) Take 2 tablets by mouth at bedtime.     triamterene-hydrochlorothiazide (MAXZIDE-25) 37.5-25 MG tablet TAKE 1 TABLET BY MOUTH DAILY 90 tablet 0   zolpidem (AMBIEN) 10 MG tablet TAKE 1 TABLET BY MOUTH AT BEDTIME AS NEEDED FOR SLEEP. 30 tablet 1   Facility-Administered Medications Prior to Visit  Medication Dose Route Frequency Provider Last Rate Last Admin   pneumococcal 13-valent conjugate vaccine (PREVNAR 13) injection 0.5 mL  0.5 mL Intramuscular Once [name], MD        Review of Systems;  Patient denies headache, fevers, malaise, unintentional weight loss, skin rash, eye pain, sinus congestion and sinus pain, sore throat, dysphagia,  hemoptysis , cough, dyspnea, wheezing, chest pain, palpitations, orthopnea, edema, abdominal pain, nausea, melena, diarrhea, constipation, flank pain, dysuria, hematuria, urinary  Frequency, nocturia, numbness, tingling, seizures,  Focal weakness, Loss of consciousness,  Tremor, insomnia, depression, anxiety, and suicidal ideation.      Objective:  BP (!) 106/58 (BP Location: Left Arm, Patient Position: Sitting, Cuff Size: Large)   Pulse 66   Temp 97.7 F (36.5  C) (Oral)   Ht '5\' 7"'  (1.702 m)   Wt 217 lb (98.4 kg)   SpO2 98%   BMI 33.99 kg/m   BP Readings from Last 3 Encounters:  04/18/22 (!) 106/58  04/15/22 140/70  02/14/22 128/62    Wt Readings from Last 3 Encounters:  04/18/22 217 lb (98.4 kg)  04/15/22 219 lb (99.3 kg)  02/14/22 218 lb (98.9 kg)    General appearance: alert, cooperative and appears stated age Ears: normal TM's and external ear canals both ears Throat: lips, mucosa, and tongue normal; teeth and gums normal Neck: no adenopathy, no carotid bruit, supple, symmetrical, trachea midline and thyroid not enlarged, symmetric, no tenderness/mass/nodules Back: symmetric, no curvature. ROM normal. No CVA tenderness. Lungs: clear to auscultation bilaterally Heart: regular rate and rhythm, S1, S2 normal, no murmur, click, rub or gallop Abdomen: soft, non-tender; bowel sounds normal; no masses,  no organomegaly Pulses: 2+ and symmetric Skin: Skin color, texture, turgor normal. No rashes or lesions Lymph nodes: Cervical, supraclavicular, and axillary nodes normal.  Lab Results  Component Value Date   HGBA1C 5.2 04/18/2022   HGBA1C 5.3 10/31/2021   HGBA1C 5.2 06/11/2021    Lab Results  Component Value Date   CREATININE 0.63 04/18/2022   CREATININE 0.69 10/31/2021   CREATININE 0.57 06/24/2021    Lab Results  Component Value Date   WBC 7.6 03/19/2020   HGB 14.2 03/19/2020   HCT 42.3 03/19/2020   PLT 218 03/19/2020   GLUCOSE 85 04/18/2022   CHOL 152 04/18/2022   TRIG 64.0 04/18/2022   HDL 70.80 04/18/2022   LDLDIRECT 62.0 04/18/2022   LDLCALC 69 04/18/2022   ALT 12 04/18/2022  AST 17 04/18/2022   NA 136 04/18/2022   K 4.1 04/18/2022   CL 102 04/18/2022   CREATININE 0.63 04/18/2022   BUN 14 04/18/2022   CO2 27 04/18/2022   TSH 1.08 06/08/2014   HGBA1C 5.2 04/18/2022   MICROALBUR <0.7 09/03/2020    MM 3D SCREEN BREAST BILATERAL  Result Date: 02/14/2022 CLINICAL DATA:  Screening. EXAM: DIGITAL SCREENING  BILATERAL MAMMOGRAM WITH TOMOSYNTHESIS AND CAD TECHNIQUE: Bilateral screening digital craniocaudal and mediolateral oblique mammograms were obtained. Bilateral screening digital breast tomosynthesis was performed. The images were evaluated with computer-aided detection. Best images possible per technologist communication. COMPARISON:  Previous exam(s). ACR Breast Density Category b: There are scattered areas of fibroglandular density. FINDINGS: There are no findings suspicious for malignancy. IMPRESSION: No mammographic evidence of malignancy. A result letter of this screening mammogram will be mailed directly to the patient. RECOMMENDATION: Screening mammogram in one year. (Code:SM-B-01Y) BI-RADS CATEGORY  1: Negative. Electronically Signed   By: Valentino Saxon M.D.   On: 02/14/2022 13:13    Assessment & Plan:   Problem List Items Addressed This Visit     Hyperlipidemia associated with type 2 diabetes mellitus ([place])   Relevant Orders   Lipid Profile (Completed)   Direct LDL (Completed)   Type 2 diabetes mellitus with obesity ([place])    She has lost 118 lbs since 2021.  Last a1c was 5.3 in November .   Continue ozempic,  Today we will  increase dose to 2 milligrams weekly        Relevant Orders   HgB A1c (Completed)   Comp Met (CMET) (Completed)   Insomnia    Chronic, with no improvement using over-the-counter first generation antihistamines. Reviewed principles of good sleep hygiene. Although she is a snorer, there is no report of apneic spells by husband. Continue  use of Ambien        Relevant Medications   zolpidem (AMBIEN) 10 MG tablet   Hepatic steatosis    Liver enzymes have been normal since 2015       Essential hypertension, benign - Primary    Well controlled on current regimen. Renal function stable, no changes today.  Lab Results  Component Value Date   CREATININE 0.63 04/18/2022   Lab Results  Component Value Date   NA 136 04/18/2022   K 4.1 04/18/2022   CL  102 04/18/2022   CO2 27 04/18/2022         Relevant Orders   Comp Met (CMET) (Completed)   Abdominal aortic atherosclerosis ([place])    She remains asymptomatic .  Reviewed findings of prior CT scan today..  Patient is  Tolerating tolerating atorvastatin  20 mg  daily for primary prevention   Lab Results  Component Value Date   CHOL 152 04/18/2022   HDL 70.80 04/18/2022   LDLCALC 69 04/18/2022   LDLDIRECT 62.0 04/18/2022   TRIG 64.0 04/18/2022   CHOLHDL 2 04/18/2022         Follow-up: Return in about 3 months (around 07/19/2022) for follow up diabetes.   [name], MD

## 2022-04-20 ENCOUNTER — Encounter: Payer: Self-pay | Admitting: Internal Medicine

## 2022-04-20 DIAGNOSIS — K760 Fatty (change of) liver, not elsewhere classified: Secondary | ICD-10-CM | POA: Insufficient documentation

## 2022-04-20 DIAGNOSIS — G4700 Insomnia, unspecified: Secondary | ICD-10-CM | POA: Insufficient documentation

## 2022-04-20 NOTE — Assessment & Plan Note (Signed)
Liver enzymes have been normal since 2015

## 2022-04-20 NOTE — Assessment & Plan Note (Signed)
Chronic, with no improvement using over-the-counter first generation antihistamines. Reviewed principles of good sleep hygiene. Although she is a snorer, there is no report of apneic spells by husband. Continue  use of Ambien

## 2022-04-20 NOTE — Assessment & Plan Note (Signed)
She remains asymptomatic .  Reviewed findings of prior CT scan today..  Patient is  Tolerating tolerating atorvastatin  20 mg  daily for primary prevention   Lab Results  Component Value Date   CHOL 152 04/18/2022   HDL 70.80 04/18/2022   LDLCALC 69 04/18/2022   LDLDIRECT 62.0 04/18/2022   TRIG 64.0 04/18/2022   CHOLHDL 2 04/18/2022

## 2022-04-20 NOTE — Assessment & Plan Note (Signed)
Well controlled on current regimen. Renal function stable, no changes today.  Lab Results  Component Value Date   CREATININE 0.63 04/18/2022   Lab Results  Component Value Date   NA 136 04/18/2022   K 4.1 04/18/2022   CL 102 04/18/2022   CO2 27 04/18/2022

## 2022-04-25 ENCOUNTER — Telehealth: Payer: Self-pay

## 2022-04-25 NOTE — Telephone Encounter (Signed)
SS order placed in [facility] folder-Toni 

## 2022-04-29 ENCOUNTER — Other Ambulatory Visit: Payer: Self-pay | Admitting: Internal Medicine

## 2022-04-30 ENCOUNTER — Ambulatory Visit: Payer: Medicare Other | Admitting: Internal Medicine

## 2022-04-30 DIAGNOSIS — R0602 Shortness of breath: Secondary | ICD-10-CM

## 2022-05-06 ENCOUNTER — Telehealth: Payer: Self-pay

## 2022-05-06 NOTE — Telephone Encounter (Signed)
Patient scheduled for a home sleep study on 05/20/22 per [facility]. (Pt prefers home doing a home study).tat

## 2022-05-06 NOTE — Telephone Encounter (Signed)
PA for [facility] sensor has been submitted on covermymeds.

## 2022-05-13 ENCOUNTER — Telehealth: Payer: Self-pay

## 2022-05-13 NOTE — Telephone Encounter (Signed)
Home SS studied scheduled for 05/20/22. Patient to return device to [facility] 05/22/22-Toni

## 2022-05-14 NOTE — Procedures (Signed)
[facility] [place], [address]    Complete Pulmonary Function Testing Interpretation:  FINDINGS:  The forced vital capacity is normal.  FEV1 is normal.  FEV1 FVC ratio is normal.  Postbronchodilator no significant changes noted.  Total lung capacity is normal.  Residual volume is decreased FRC was decreased.  DLCO was normal.  IMPRESSION:  This pulmonary function study is within normal limits  Allyne Gee, MD [facility] Pulmonary Critical Care Medicine Sleep Medicine

## 2022-05-20 ENCOUNTER — Encounter: Payer: Self-pay | Admitting: Internal Medicine

## 2022-05-20 LAB — PULMONARY FUNCTION TEST

## 2022-05-21 ENCOUNTER — Encounter (INDEPENDENT_AMBULATORY_CARE_PROVIDER_SITE_OTHER): Payer: Medicare Other | Admitting: Internal Medicine

## 2022-05-21 DIAGNOSIS — G4719 Other hypersomnia: Secondary | ICD-10-CM

## 2022-05-21 DIAGNOSIS — G4733 Obstructive sleep apnea (adult) (pediatric): Secondary | ICD-10-CM

## 2022-05-22 ENCOUNTER — Other Ambulatory Visit: Payer: Self-pay | Admitting: Internal Medicine

## 2022-06-10 ENCOUNTER — Other Ambulatory Visit: Payer: Self-pay | Admitting: Internal Medicine

## 2022-06-10 NOTE — Procedures (Signed)
[facility]  Portable Polysomnogram Report Part 1 Phone: [phone] Fax: [phone]  Patient Name: Katie Huber, Katie Huber Recording Device: Glee Arvin  D.O.B.: [birth year] Acquisition Number: [id number]  Referring Physician: Devona Konig, MD Acquisition Date: 05/21/2022   History: The patient is a 71 years old female who was referred for re-evaluation of obstructive sleep apnea following weight loss.  Medical History: hypercalcemia, depression, anxiety, CAD, abdominal aortic atherosclerosis, vitamin D deficiency, diarrhea, adenoma of colon, B12 deficiency, chronic respiratory failure, asthma, herpes, pulmonary hypertension, hypertension, diabetes, hyperlipidemia. Medications: albuterol, amlodipine, atorvastatin, furosemide, gabapentin, losartan, montelukast, omeprazole, paroxetine, promethazine, Maxzide, zolpidem.  PROCEDURE  The unattended portable polysomnogram was conducted on the night of 05/21/2022.  The following parameters were monitored: Nasal and oral airflow, and body position. Additionally, thoracic and abdominal movements were recorded by inductance plethysmography. Oxygen saturation (SpO2) and heart rate (ECG) was monitored using a pulse Oximeter.  The tracing was scored using 30 second epochs. Hypopneas were scored per AASM definition VIIID1.B (4% desaturation).   Description: The total recording time was 329.5 minutes. Sleep parameters are not recorded.  Respiratory monitoring demonstrated significant snoring across the night in all positions. There were a total of 83 apneas and hypopneas for a Respiratory Event Index of 15.3 apneas and hypopneas per hour of recording which increased to 32.8 in the supine position. The average duration of the respiratory events was 34.2 seconds with a maximum duration of 87.0 seconds. The respiratory events were associated with peripheral oxygen desaturations on the average to 93 %. The lowest oxygen desaturation associated  with a respiratory event was 74 %. Additionally, the mean oxygen saturation was 93 %. The total duration of oxygen < 90% was 13.5 minutes and <80% was 0.0 minutes.   Cardiac monitoring- The average heart rate during the recording was 59.3 bpm.  Impression: Despite weight loss of over 100 lbs, this routine overnight portable polysomnogram demonstrated the presence of position-dependent obstructive sleep apnea. Overall the Respiratory Event Index was 15.3 apneas and hypopneas per hour of recording which increased to 32.8 in the supine position. The lowest desaturation was to 74 %.   Recommendations:     A re-titration of CPAP would be recommended due to the severity of the sleep apnea. Some supine sleep should be ensured to optimize the titration. Would recommend weight loss in a patient with a BMI of 34.3 lb/in2.  Allyne Gee, MD [facility] Pulmonary Critical Care and Sleep Medicine Electronically reviewed and digitally signed   [facility]  Portable Polysomnogram Report Part 2 Phone: [phone] Fax: [phone]    Study Date: 05/21/2022  Patient Name: Katie, Huber Recording Device: Glee Arvin  Sex: F Height: 67.0 in.  D.O.B.: May 28, 1951 Weight: 219.0 lbs.  Age: 11 years B.M.I: 34.3 lb/in2   Times and Durations  Lights off clock time:  10:25:11 PM Total Recording Time (TRT): 329.5 minutes  Lights on clock time: 3:54:41 AM Time In Bed (TIB): 329.5 minutes   Summary  AHI 15.3 OAI 0.9 CAI 0.0 Lowest Desat 74  AHI is the number of apneas and hypopneas per hour. OAI is the number of obstructive apneas per hour. CAI is the number of central apneas per hour. Lowest Desat is the lowest blood oxygen level that lasted at least 2 seconds.  RESPIRATORY EVENTS   Index (#/hour) Total # of Events Mean duration  (sec) Max duration  (sec) # of Events by Position       Supine  Prone Left Right Up  Central Apneas 0.0 0 0.0 0.0 0 0 0 0 0  Obstructive Apneas 0.9 5  20.1 34.0 4 0 0 1 0  Mixed Apneas 0.0 0 0.0 0.0 0 0 0 0 0  Hypopneas 14.4 78 35.1 87.0 69 0 5 4 0  Apneas + Hypopneas 15.3 83 34.2 87.0 73 0 5 5 0  Total 15.3 83 34.2 87.0 73 0 5 5 0  Time in Position 133.6 58.5 76.6 56.5 2.8  AHI in Position 32.8 0.0 3.9 5.3 0.0    Oximetry Summary   Dur. (min) % TIB  <90 % 13.5 4.1  <85 % 0.0 0.0  <80 % 0.0 0.0  <70 % 0.0 0.0  Total Dur (min) < 89 4.4 min  Average (%) 93  Total # of Desats 124  Desat Index (#/hour) 23.1  Desat Max (%) 9  Desat Max dur (sec) 84.0  Lowest SpO2 % during sleep 74  Duration of Min SpO2 (sec) 2    Heart Rate Stats  Mean HR during sleep (BPM)  Highest HR during sleep 69  (BPM)  Highest HR during TIB  69 (BPM)    Snoring Summary  Total Snoring Episodes 313  Total Duration with Snoring 125.6 minutes  Mean Duration of Snoring 24.1 seconds  Percentage of Snoring 38.6 %

## 2022-06-11 ENCOUNTER — Telehealth: Payer: Self-pay

## 2022-06-11 NOTE — Telephone Encounter (Signed)
LMTCB. Need to let pt know that we do not have documentation that she has ever received a hepatitis vaccine.

## 2022-06-11 NOTE — Telephone Encounter (Signed)
Patient states she would like for [place] to fax proof of her hepatitis vaccine to her employer:  Katie Huber Fax:  [phone] Phone:  [phone]

## 2022-06-16 NOTE — Telephone Encounter (Signed)
Pt called back and I read the message to her and she verbalize understanding

## 2022-06-16 NOTE — Telephone Encounter (Signed)
LMTCB

## 2022-06-16 NOTE — Telephone Encounter (Signed)
noted 

## 2022-06-23 ENCOUNTER — Other Ambulatory Visit: Payer: Self-pay | Admitting: Internal Medicine

## 2022-07-16 ENCOUNTER — Other Ambulatory Visit: Payer: Self-pay | Admitting: Internal Medicine

## 2022-07-25 DIAGNOSIS — E1169 Type 2 diabetes mellitus with other specified complication: Secondary | ICD-10-CM | POA: Diagnosis not present

## 2022-07-28 ENCOUNTER — Telehealth: Payer: Self-pay

## 2022-07-28 ENCOUNTER — Other Ambulatory Visit: Payer: Self-pay | Admitting: Internal Medicine

## 2022-07-28 NOTE — Telephone Encounter (Signed)
Received pt's patient assistance medication. Pt is aware that it is ready for pick up.   Ozempic: 4 boxes

## 2022-07-30 ENCOUNTER — Telehealth: Payer: Self-pay | Admitting: Internal Medicine

## 2022-07-30 NOTE — Telephone Encounter (Signed)
Patient received the Continuous Blood Gluc Sensor (FREESTYLE LIBRE 2 SENSOR) MISC, no meter came with it. She called pharmacy and they will not give her one unless she pays $250.

## 2022-07-30 NOTE — Telephone Encounter (Signed)
Spoke with pt and she stated that a rx will need to be sent to [facility] so they can send her the reader without her having to pay the $250.

## 2022-07-31 ENCOUNTER — Telehealth: Payer: Self-pay

## 2022-07-31 NOTE — Telephone Encounter (Signed)
error 

## 2022-07-31 NOTE — Telephone Encounter (Signed)
Pt has picked up medication.  

## 2022-08-22 ENCOUNTER — Ambulatory Visit
Admission: RE | Admit: 2022-08-22 | Discharge: 2022-08-22 | Disposition: A | Discharge status: Home / Self Care | Payer: Medicare Other | Source: Ambulatory Visit | Attending: Urgent Care | Admitting: Urgent Care

## 2022-08-22 ENCOUNTER — Ambulatory Visit (INDEPENDENT_AMBULATORY_CARE_PROVIDER_SITE_OTHER): Payer: Medicare Other

## 2022-08-22 ENCOUNTER — Encounter: Payer: Self-pay | Admitting: Internal Medicine

## 2022-08-22 VITALS — BP 132/76 | HR 70 | Temp 98.6°F | Resp 18 | Ht 67.0 in | Wt 208.0 lb

## 2022-08-22 DIAGNOSIS — R5383 Other fatigue: Secondary | ICD-10-CM

## 2022-08-22 DIAGNOSIS — M533 Sacrococcygeal disorders, not elsewhere classified: Secondary | ICD-10-CM

## 2022-08-22 DIAGNOSIS — U071 COVID-19: Secondary | ICD-10-CM | POA: Insufficient documentation

## 2022-08-22 DIAGNOSIS — R52 Pain, unspecified: Secondary | ICD-10-CM | POA: Insufficient documentation

## 2022-08-22 DIAGNOSIS — R5381 Other malaise: Secondary | ICD-10-CM | POA: Insufficient documentation

## 2022-08-22 DIAGNOSIS — H9313 Tinnitus, bilateral: Secondary | ICD-10-CM | POA: Insufficient documentation

## 2022-08-22 DIAGNOSIS — Z79899 Other long term (current) drug therapy: Secondary | ICD-10-CM | POA: Insufficient documentation

## 2022-08-22 DIAGNOSIS — R0981 Nasal congestion: Secondary | ICD-10-CM | POA: Diagnosis not present

## 2022-08-22 DIAGNOSIS — E119 Type 2 diabetes mellitus without complications: Secondary | ICD-10-CM | POA: Diagnosis not present

## 2022-08-22 DIAGNOSIS — R42 Dizziness and giddiness: Secondary | ICD-10-CM | POA: Diagnosis not present

## 2022-08-22 MED ORDER — CETIRIZINE HCL 10 MG PO TABS: 10.0000 mg | ORAL_TABLET | Freq: Every day | ORAL | 0 refills | Status: DC

## 2022-08-22 MED ORDER — PSEUDOEPHEDRINE HCL 60 MG PO TABS: 60.0000 mg | ORAL_TABLET | Freq: Three times a day (TID) | ORAL | 0 refills | Status: DC | PRN

## 2022-08-22 MED ORDER — MECLIZINE HCL 12.5 MG PO TABS: 12.5000 mg | ORAL_TABLET | Freq: Three times a day (TID) | ORAL | 0 refills | Status: DC | PRN

## 2022-08-22 NOTE — Discharge Instructions (Addendum)
We will manage this as a viral illness. Please take Tylenol '500mg'$ -'650mg'$  once every 6 hours for fevers, aches and pains. Hydrate very well with at least 80 ounces of water daily. Eat light meals such as soups (chicken and noodles, chicken wild rice, vegetable).  Do not eat any foods that you are allergic to.  Start an antihistamine like Zyrtec for postnasal drainage, sinus congestion.  You can take this together with pseudoephedrine (Sudafed) at a dose of 60 mg 3 times a day or twice daily as needed for the same kind of congestion.    For the dizziness itself you can trial meclizine (Antivert). It can make you drowsy so be careful with using this medication.  If you develop any urinary symptoms, cough, chest pain, shortness of breath we need to see you again to listen to heart sounds again, consider getting a urinalysis and chest x-ray.

## 2022-08-22 NOTE — ED Provider Notes (Signed)
[facility]  Note:  This document was prepared using Systems analyst and may include unintentional dictation errors.  MRN: [id number] DOB: November 29, 1951  Subjective:   Katie Huber is a 71 y.o. female presenting for 1 week history of persistent dizziness, unsteadiness, sinus congestion, tinnitus, body aches, malaise. Has taken 2 COVID tests and both were negative. Unfortunately she suffered a fall this week from her dizziness absorbing the impact on her buttocks/coccyx.  She is concerned about a fracture there.  No head injury, headache, confusion, vision changes, weakness, numbness or tingling, chest pain, shortness of breath, palpitations, heart racing, history of stroke, history of heart disease, history of arrhythmias dysuria, urinary hematuria, urinary frequency.  Patient has a history of type 2 diabetes but her blood sugar is normal now.   Current Facility-Administered Medications:    pneumococcal 13-valent conjugate vaccine (PREVNAR 13) injection 0.5 mL, 0.5 mL, Intramuscular, Once, [name], MD  Current Outpatient Medications:    albuterol (PROVENTIL) (2.5 MG/3ML) 0.083% nebulizer solution, Take 3 mLs (2.5 mg total) by nebulization every 6 (six) hours as needed for wheezing or shortness of breath., Disp: 150 mL, Rfl: 1   albuterol (VENTOLIN HFA) 108 (90 Base) MCG/ACT inhaler, Inhale 2 puffs into the lungs every 6 (six) hours as needed for wheezing or shortness of breath., Disp: 3 each, Rfl: 1   amLODipine (NORVASC) 10 MG tablet, TAKE 1 TABLET BY MOUTH DAILY, Disp: 90 tablet, Rfl: 1   atorvastatin (LIPITOR) 20 MG tablet, TAKE 1 TABLET BY MOUTH DAILY, Disp: 90 tablet, Rfl: 3   Continuous Blood Gluc Sensor (FREESTYLE LIBRE 14 DAY SENSOR) MISC, For diabetes management, Disp: 6 each, Rfl: 3   Continuous Blood Gluc Sensor (FREESTYLE LIBRE 2 SENSOR) MISC, For diabetes management, Disp: 6 each, Rfl: 3   cyclobenzaprine (FLEXERIL) 10 MG tablet, Take 1 tablet  (10 mg total) by mouth 3 (three) times daily as needed for muscle spasms., Disp: 30 tablet, Rfl: 5   furosemide (LASIX) 20 MG tablet, TAKE ONE TABLET EVERY DAY AS NEEDED FOR FLUID RETENTION, Disp: 90 tablet, Rfl: 1   gabapentin (NEURONTIN) 300 MG capsule, TAKE 2 CAPSULES 3 TIMES DAILY, Disp: 540 capsule, Rfl: 3   losartan (COZAAR) 100 MG tablet, TAKE ONE TABLET EVERY DAY, Disp: 90 tablet, Rfl: 1   montelukast (SINGULAIR) 10 MG tablet, TAKE 1 TABLET BY MOUTH AT BEDTIME., Disp: 90 tablet, Rfl: 3   omeprazole (PRILOSEC) 20 MG capsule, TAKE 1 CAPSULE BY MOUTH TWICE DAILY, Disp: 180 capsule, Rfl: 3   PARoxetine (PAXIL) 20 MG tablet, TAKE ONE TABLET EVERY MORNING, Disp: 90 tablet, Rfl: 3   promethazine (PHENERGAN) 25 MG tablet, TAKE ONE TABLET BY MOUTH EVERY 8 HOURS AS NEEDED FOR NAUSEA OR VOMITING, Disp: 90 tablet, Rfl: 0   Semaglutide, 2 MG/DOSE, (OZEMPIC, 2 MG/DOSE,) 8 MG/3ML SOPN, Inject 2 mg into the skin once a week., Disp: , Rfl:    Sennosides-Docusate Sodium (SENNA PLUS PO), Take 2 tablets by mouth at bedtime., Disp: , Rfl:    triamterene-hydrochlorothiazide (MAXZIDE-25) 37.5-25 MG tablet, TAKE 1 TABLET BY MOUTH DAILY, Disp: 90 tablet, Rfl: 0   zolpidem (AMBIEN) 10 MG tablet, Take 1 tablet (10 mg total) by mouth at bedtime as needed. for sleep, Disp: 30 tablet, Rfl: 5   No Known Allergies  Past Medical History:  Diagnosis Date   Asthma 1990   Chronic respiratory failure with hypoxia ([place]) 12/11/2017   Hypertension    Obesity, Class III, BMI 40-49.9 (morbid obesity) (  [place])    weight fluctuations of 100 lbs      Past Surgical History:  Procedure Laterality Date   ABDOMINAL HYSTERECTOMY  2001   BREAST BIOPSY Right    Neg   BREAST EXCISIONAL BIOPSY Right 1990s   multiple hematomas   CHOLECYSTECTOMY  1970s   COLONOSCOPY WITH PROPOFOL N/A 03/30/2018   Procedure: COLONOSCOPY WITH PROPOFOL;  Surgeon: Lucilla Lame, MD;  Location: [facility] END[facility]COPY;  Service: Endoscopy;  Laterality: N/A;   JOINT  REPLACEMENT     Bilateral   STOMACH SURGERY  [phone]   gastric partitionin( for obesity)     Family History  Problem Relation Age of Onset   Heart disease Father    COPD Father    Hyperlipidemia Father    Mental illness Brother    Breast cancer Neg Hx     Social History   Tobacco Use   Smoking status: Never   Smokeless tobacco: Never  Vaping Use   Vaping Use: Never used  Substance Use Topics   Alcohol use: Yes    Comment: ocassionally   Drug use: No    ROS   Objective:   Vitals: BP 132/76   Pulse 70   Temp 98.6 F (37 C)   Resp 18   SpO2 97%   Physical Exam Constitutional:      General: She is not in acute distress.    Appearance: Normal appearance. She is well-developed. She is obese. She is not ill-appearing, toxic-appearing or diaphoretic.  HENT:     Head: Normocephalic and atraumatic.     Right Ear: Tympanic membrane, ear canal and external ear normal. No drainage or tenderness. No middle ear effusion. There is no impacted cerumen. Tympanic membrane is not erythematous.     Left Ear: Tympanic membrane, ear canal and external ear normal. No drainage or tenderness.  No middle ear effusion. There is no impacted cerumen. Tympanic membrane is not erythematous.     Nose: Nose normal. No congestion or rhinorrhea.     Mouth/Throat:     Mouth: Mucous membranes are moist. No oral lesions.     Pharynx: No pharyngeal swelling, oropharyngeal exudate, posterior oropharyngeal erythema or uvula swelling.     Tonsils: No tonsillar exudate or tonsillar abscesses.  Eyes:     General: No scleral icterus.       Right eye: No discharge.        Left eye: No discharge.     Extraocular Movements: Extraocular movements intact.     Right eye: Normal extraocular motion.     Left eye: Normal extraocular motion.     Conjunctiva/sclera: Conjunctivae normal.  Cardiovascular:     Rate and Rhythm: Normal rate and regular rhythm.     Heart sounds: Normal heart sounds. No murmur  heard.    No friction rub. No gallop.  Pulmonary:     Effort: Pulmonary effort is normal. No respiratory distress.     Breath sounds: No stridor. No wheezing, rhonchi or rales.  Chest:     Chest wall: No tenderness.  Musculoskeletal:     Cervical back: Normal range of motion and neck supple.  Lymphadenopathy:     Cervical: No cervical adenopathy.  Skin:    General: Skin is warm and dry.  Neurological:     General: No focal deficit present.     Mental Status: She is alert and oriented to person, place, and time.     Cranial Nerves: No cranial nerve deficit.  Motor: No weakness.     Coordination: Coordination normal.     Gait: Gait normal.     Deep Tendon Reflexes: Reflexes normal.  Psychiatric:        Mood and Affect: Mood normal.        Behavior: Behavior normal.        Thought Content: Thought content normal.        Judgment: Judgment normal.    DG Sacrum/Coccyx  Result Date: 08/22/2022 CLINICAL DATA:  Pain over the coccyx. EXAM: SACRUM AND COCCYX - 2+ VIEW COMPARISON:  None Available. FINDINGS: There is no acute fracture or dislocation. The soft tissues are unremarkable. IMPRESSION: Negative. Electronically Signed   By: Anner Crete M.D.   On: 08/22/2022 17:33    Patient attempted to hydrate to provide a urine sample and ultimately ended up deciding to forgo this test.   Assessment and Plan :   PDMP not reviewed this encounter.  1. Dizziness   2. Body aches   3. Coccyx pain   4. Sinus congestion   5. Tinnitus of both ears   6. Malaise and fatigue     Undifferentiated dizziness.  I suspect that she has a viral illness given her upper respiratory symptoms that may be triggering a form of eustachian tube dysfunction.  Advised supportive care, meclizine, Zyrtec and pseudoephedrine.  Also encourage patient to hydrate well and consistently.  We discussed test that could be utilized for dizziness including chest x-ray, EKG, urinalysis and we agreed to defer for now  she is largely asymptomatic with cardiopulmonary and urinary symptoms.  COVID test pending.  Counseled patient on potential for adverse effects with medications prescribed/recommended today, ER and return-to-clinic precautions discussed, patient verbalized understanding.    Jaynee Eagles, [place] 08/22/22 [phone]

## 2022-08-22 NOTE — Telephone Encounter (Signed)
Pt actually scheduled with [facility] today at 5p. After speaking with her & letting her know some have experienced dizziness with Covid in the past I was not sure if she may need to be tested again. Too, since it was so late on a Friday we did not have any appointments & with dizziness there can be so many factors that she needed to be seen in person as can at times ne heart related. Pt luckily was agreeable to UC, as I explained with acute sx she did not need to wait into next week.

## 2022-08-22 NOTE — ED Triage Notes (Signed)
Patient to Urgent Care with complaints of generalized body aches/ dizziness x1 week. Reports symptoms first started last Wednesday, on Friday- negative covid test, started feeling dizzy and drunk.  Had two falls this week d/t dizziness. Reports fall onto her bottom and is now having coccyx pain. Denies hitting head/ LOC.   Still feels dizzy and "drunk".

## 2022-08-23 LAB — SARS CORONAVIRUS 2 (TAT 6-24 HRS): SARS Coronavirus 2: POSITIVE — AB

## 2022-08-25 DIAGNOSIS — E1169 Type 2 diabetes mellitus with other specified complication: Secondary | ICD-10-CM | POA: Diagnosis not present

## 2022-08-30 ENCOUNTER — Other Ambulatory Visit: Payer: Self-pay | Admitting: Internal Medicine

## 2022-09-05 ENCOUNTER — Telehealth: Payer: Self-pay | Admitting: Pharmacy Technician

## 2022-09-05 DIAGNOSIS — Z596 Low income: Secondary | ICD-10-CM

## 2022-09-05 NOTE — Progress Notes (Signed)
[facility])                                            [place] Team    09/05/2022  Katie Huber 15-Jul-1951 [id number]                                      Medication Assistance Referral-FOR 2024 RE ENROLLMENT  Referral From:  [facility]  Katie Huber  Medication/Company: Katie Huber / [employer] Patient application portion:  Mailed Provider application portion: Faxed  to Dr. Deborra Huber  Provider address/fax verified via:  Katie Huber Katie Huber, [place]  [phone]

## 2022-09-17 NOTE — Telephone Encounter (Signed)
done

## 2022-09-25 DIAGNOSIS — E1169 Type 2 diabetes mellitus with other specified complication: Secondary | ICD-10-CM | POA: Diagnosis not present

## 2022-10-03 ENCOUNTER — Other Ambulatory Visit: Payer: Self-pay | Admitting: Internal Medicine

## 2022-10-03 DIAGNOSIS — G4700 Insomnia, unspecified: Secondary | ICD-10-CM

## 2022-10-18 ENCOUNTER — Other Ambulatory Visit: Payer: Self-pay | Admitting: Family

## 2022-10-20 ENCOUNTER — Ambulatory Visit (INDEPENDENT_AMBULATORY_CARE_PROVIDER_SITE_OTHER): Payer: Medicare Other | Admitting: Internal Medicine

## 2022-10-20 ENCOUNTER — Encounter: Payer: Self-pay | Admitting: Internal Medicine

## 2022-10-20 DIAGNOSIS — K760 Fatty (change of) liver, not elsewhere classified: Secondary | ICD-10-CM

## 2022-10-20 DIAGNOSIS — I10 Essential (primary) hypertension: Secondary | ICD-10-CM | POA: Diagnosis not present

## 2022-10-20 DIAGNOSIS — I272 Pulmonary hypertension, unspecified: Secondary | ICD-10-CM | POA: Diagnosis not present

## 2022-10-20 DIAGNOSIS — M542 Cervicalgia: Secondary | ICD-10-CM

## 2022-10-20 DIAGNOSIS — E1169 Type 2 diabetes mellitus with other specified complication: Secondary | ICD-10-CM | POA: Diagnosis not present

## 2022-10-20 DIAGNOSIS — E669 Obesity, unspecified: Secondary | ICD-10-CM

## 2022-10-20 DIAGNOSIS — E785 Hyperlipidemia, unspecified: Secondary | ICD-10-CM

## 2022-10-20 DIAGNOSIS — Z23 Encounter for immunization: Secondary | ICD-10-CM | POA: Diagnosis not present

## 2022-10-20 MED ORDER — TIRZEPATIDE 7.5 MG/0.5ML ~~LOC~~ SOAJ: 7.5000 mg | SUBCUTANEOUS | 1 refills | Status: DC

## 2022-10-20 MED ORDER — GABAPENTIN 300 MG PO CAPS: 600.0000 mg | ORAL_CAPSULE | Freq: Four times a day (QID) | ORAL | 0 refills | Status: DC

## 2022-10-20 NOTE — Assessment & Plan Note (Signed)
Transient,  With normal PTH and borderline low vitamin D.  Addressed with supplementation and normalized  Lab Results  Component Value Date   CALCIUM 9.9 04/18/2022

## 2022-10-20 NOTE — Progress Notes (Unsigned)
Subjective:  Patient ID: Katie Huber, female    DOB: 1951-11-17  Age: 71 y.o. MRN: [id number]  CC: There were no encounter diagnoses.   HPI Katie Huber presents for  Chief Complaint  Patient presents with   Follow-up    Follow up on diabetes, and hypertension    1) Type 2 DM with obesity and hypertension.  Taking ozempic 2 mg weekly and has gained weight , 5 lbs   , since last visit. Still taking it back to craving  food again.  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    2) Recent COVID infection  on Sept 21 :  she presented with cough and body aches. First test was negative.  Had to leave a women's retreat when she fell in the middle of the night and was delirious.  Retested at [facility] and positive.   3) taking 600 mg gabapentin   three times wants to increase , hands staying numb  at night.   Outpatient Medications Prior to Visit  Medication Sig Dispense Refill   albuterol (PROVENTIL) (2.5 MG/3ML) 0.083% nebulizer solution Take 3 mLs (2.5 mg total) by nebulization every 6 (six) hours as needed for wheezing or shortness of breath. 150 mL 1   albuterol (VENTOLIN HFA) 108 (90 Base) MCG/ACT inhaler Inhale 2 puffs into the lungs every 6 (six) hours as needed for wheezing or shortness of breath. 3 each 1   amLODipine (NORVASC) 10 MG tablet TAKE 1 TABLET BY MOUTH DAILY 90 tablet 1   atorvastatin (LIPITOR) 20 MG tablet TAKE 1 TABLET BY MOUTH DAILY 90 tablet 3   Continuous Blood Gluc Sensor (FREESTYLE LIBRE 14 DAY SENSOR) MISC For diabetes management 6 each 3   Continuous Blood Gluc Sensor (FREESTYLE LIBRE 2 SENSOR) MISC For diabetes management 6 each 3   cyclobenzaprine (FLEXERIL) 10 MG tablet TAKE ONE TABLET (10 MG) BY MOUTH 3 TIMESDAILY AS NEEDED FOR MUSCLE SPASMS 30 tablet 5   furosemide (LASIX) 20 MG tablet TAKE ONE TABLET EVERY DAY AS NEEDED FOR FLUID RETENTION 90 tablet 1   gabapentin  (NEURONTIN) 300 MG capsule TAKE TWO CAPSULES THREE TIMES A DAY 540 capsule 3   losartan (COZAAR) 100 MG tablet TAKE ONE TABLET EVERY DAY 90 tablet 1   montelukast (SINGULAIR) 10 MG tablet TAKE 1 TABLET BY MOUTH AT BEDTIME. 90 tablet 3   omeprazole (PRILOSEC) 20 MG capsule TAKE 1 CAPSULE BY MOUTH TWICE DAILY 180 capsule 3   PARoxetine (PAXIL) 20 MG tablet TAKE ONE TABLET EVERY MORNING 90 tablet 3   promethazine (PHENERGAN) 25 MG tablet TAKE ONE TABLET BY MOUTH EVERY 8 HOURS AS NEEDED FOR NAUSEA OR VOMITING 90 tablet 0   Semaglutide, 2 MG/DOSE, (OZEMPIC, 2 MG/DOSE,) 8 MG/3ML SOPN Inject 2 mg into the skin once a week.     Sennosides-Docusate Sodium (SENNA PLUS PO) Take 2 tablets by mouth at bedtime.     triamterene-hydrochlorothiazide (MAXZIDE-25) 37.5-25 MG tablet TAKE 1 TABLET BY MOUTH DAILY 90 tablet 0   zolpidem (AMBIEN) 10 MG tablet TAKE ONE TABLET (10 MG) BY MOUTH AT BEDTIME AS NEEDED FOR SLEEP 30 tablet 5   cetirizine (ZYRTEC ALLERGY) 10 MG tablet Take 1 tablet (10 mg total) by mouth daily. (Patient not taking: Reported on 10/20/2022) 30 tablet 0   meclizine (ANTIVERT) 12.5 MG tablet Take 1 tablet (12.5 mg total) by mouth 3 (  three) times daily as needed for dizziness. (Patient not taking: Reported on 10/20/2022) 30 tablet 0   pseudoephedrine (SUDAFED) 60 MG tablet Take 1 tablet (60 mg total) by mouth every 8 (eight) hours as needed for congestion. (Patient not taking: Reported on 10/20/2022) 30 tablet 0   Facility-Administered Medications Prior to Visit  Medication Dose Route Frequency Provider Last Rate Last Admin   pneumococcal 13-valent conjugate vaccine (PREVNAR 13) injection 0.5 mL  0.5 mL Intramuscular Once [name], MD        Review of Systems;  Patient denies headache, fevers, malaise, unintentional weight loss, skin rash, eye pain, sinus congestion and sinus pain, sore throat, dysphagia,  hemoptysis , cough, dyspnea, wheezing, chest pain, palpitations, orthopnea, edema,  abdominal pain, nausea, melena, diarrhea, constipation, flank pain, dysuria, hematuria, urinary  Frequency, nocturia, numbness, tingling, seizures,  Focal weakness, Loss of consciousness,  Tremor, insomnia, depression, anxiety, and suicidal ideation.      Objective:  BP 120/64 (BP Location: Left Arm, Patient Position: Sitting, Cuff Size: Large)   Pulse 63   Temp 98 F (36.7 C) (Oral)   Ht '5\' 7"'$  (1.702 m)   Wt 222 lb (100.7 kg)   SpO2 96%   BMI 34.77 kg/m   BP Readings from Last 3 Encounters:  10/20/22 120/64  08/22/22 132/76  04/18/22 (!) 106/58    Wt Readings from Last 3 Encounters:  10/20/22 222 lb (100.7 kg)  08/22/22 208 lb (94.3 kg)  04/18/22 217 lb (98.4 kg)    General appearance: alert, cooperative and appears stated age Ears: normal TM's and external ear canals both ears Throat: lips, mucosa, and tongue normal; teeth and gums normal Neck: no adenopathy, no carotid bruit, supple, symmetrical, trachea midline and thyroid not enlarged, symmetric, no tenderness/mass/nodules Back: symmetric, no curvature. ROM normal. No CVA tenderness. Lungs: clear to auscultation bilaterally Heart: regular rate and rhythm, S1, S2 normal, no murmur, click, rub or gallop Abdomen: soft, non-tender; bowel sounds normal; no masses,  no organomegaly Pulses: 2+ and symmetric Skin: Skin color, texture, turgor normal. No rashes or lesions Lymph nodes: Cervical, supraclavicular, and axillary nodes normal. Neuro:  awake and interactive with normal mood and affect. Higher cortical functions are normal. Speech is clear without word-finding difficulty or dysarthria. Extraocular movements are intact. Visual fields of both eyes are grossly intact. Sensation to light touch is grossly intact bilaterally of upper and lower extremities. Motor examination shows 4+/5 symmetric hand grip and upper extremity and 5/5 lower extremity strength. There is no pronation or drift. Gait is non-ataxic   Lab Results   Component Value Date   HGBA1C 5.2 04/18/2022   HGBA1C 5.3 10/31/2021   HGBA1C 5.2 06/11/2021    Lab Results  Component Value Date   CREATININE 0.63 04/18/2022   CREATININE 0.69 10/31/2021   CREATININE 0.57 06/24/2021    Lab Results  Component Value Date   WBC 7.6 03/19/2020   HGB 14.2 03/19/2020   HCT 42.3 03/19/2020   PLT 218 03/19/2020   GLUCOSE 85 04/18/2022   CHOL 152 04/18/2022   TRIG 64.0 04/18/2022   HDL 70.80 04/18/2022   LDLDIRECT 62.0 04/18/2022   LDLCALC 69 04/18/2022   ALT 12 04/18/2022   AST 17 04/18/2022   NA 136 04/18/2022   K 4.1 04/18/2022   CL 102 04/18/2022   CREATININE 0.63 04/18/2022   BUN 14 04/18/2022   CO2 27 04/18/2022   TSH 1.08 06/08/2014   HGBA1C 5.2 04/18/2022   MICROALBUR <0.7 09/03/2020  DG Sacrum/Coccyx  Result Date: 08/22/2022 CLINICAL DATA:  Pain over the coccyx. EXAM: SACRUM AND COCCYX - 2+ VIEW COMPARISON:  None Available. FINDINGS: There is no acute fracture or dislocation. The soft tissues are unremarkable. IMPRESSION: Negative. Electronically Signed   By: Anner Crete M.D.   On: 08/22/2022 17:33    Assessment & Plan:   Problem List Items Addressed This Visit   None   I spent a total of   minutes with this patient in a face to face visit on the date of this encounter reviewing the last office visit with me in       ,  most recent visit with cardiology ,    ,  patient's diet and exercise habits, home blood pressure /blod sugar readings, recent ER visit including labs and imaging studies ,   and post visit ordering of testing and therapeutics.    Follow-up: No follow-ups on file.   [name], MD

## 2022-10-20 NOTE — Patient Instructions (Addendum)
YOU CAN INCREASE THE GABAPENTIN FROM 1800 MG DAILY TO  2100 MG BY INCREASE THE DOSE TO 900 MG AT BEDTIME ( 3 CAPSULES).  THE REFILL WILL ALLOW ANOTHER INCREASE TO 2400  MG  DAILY , DIVIDED HOWEVER YOU WANT.   I Have changed  your ozempic to [facility] because we can increase to dose beyond what the ozempi  can do.  Start  with the 7.5 mg dose  for now

## 2022-10-20 NOTE — Assessment & Plan Note (Signed)
Symptoms are limited to bilateral numbness and tingling of hands  which may be due. 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  She is cCurrently taking 600 mg tid.  New rx for 2400 mg max dose.  But advised to Increase gabapentin   to 2100 mg daily with an  extra 300 mg dose at bedtime

## 2022-10-21 LAB — COMPREHENSIVE METABOLIC PANEL
ALT: 11 U/L (ref 0–35)
AST: 19 U/L (ref 0–37)
Albumin: 4.1 g/dL (ref 3.5–5.2)
Alkaline Phosphatase: 56 U/L (ref 39–117)
BUN: 17 mg/dL (ref 6–23)
CO2: 30 mEq/L (ref 19–32)
Calcium: 9.8 mg/dL (ref 8.4–10.5)
Chloride: 100 mEq/L (ref 96–112)
Creatinine, Ser: 0.63 mg/dL (ref 0.40–1.20)
GFR: 89.24 mL/min (ref >60.00)
Glucose, Bld: 110 mg/dL — ABNORMAL HIGH (ref 70–99)
Potassium: 3.7 mEq/L (ref 3.5–5.1)
Sodium: 135 mEq/L (ref 135–145)
Total Bilirubin: 0.5 mg/dL (ref 0.2–1.2)
Total Protein: 6.5 g/dL (ref 6.0–8.3)

## 2022-10-21 LAB — LIPID PANEL
Cholesterol: 150 mg/dL (ref 0–200)
HDL: 65.9 mg/dL (ref >39.00)
LDL Cholesterol: 69 mg/dL (ref 0–99)
NonHDL: 84.34
Total CHOL/HDL Ratio: 2
Triglycerides: 78 mg/dL (ref 0.0–149.0)
VLDL: 15.6 mg/dL (ref 0.0–40.0)

## 2022-10-21 LAB — HEMOGLOBIN A1C: Hgb A1c MFr Bld: 5.3 % (ref 4.6–6.5)

## 2022-10-21 LAB — MICROALBUMIN / CREATININE URINE RATIO
Creatinine,U: 79.1 mg/dL
Microalb Creat Ratio: 1.4 mg/g (ref 0.0–30.0)
Microalb, Ur: 1.1 mg/dL (ref 0.0–1.9)

## 2022-10-21 LAB — LDL CHOLESTEROL, DIRECT: Direct LDL: 65 mg/dL

## 2022-10-21 NOTE — Assessment & Plan Note (Signed)
She remains asymptomatic with improved acitivity tolerance s/p weight loss of > 100 lbs Last ECHO reviewed.

## 2022-10-21 NOTE — Assessment & Plan Note (Signed)
Her blood pressure remains well controlled on current regimen. Renal function stable, no changes today.

## 2022-10-21 NOTE — Assessment & Plan Note (Addendum)
She has lost 118 lbs since 2021 ut now gaining weight on maximal ozempic dose .  Changing to [facility].  . And A1c is now in the normal range  Lab Results  Component Value Date   HGBA1C 5.2 04/18/2022

## 2022-10-21 NOTE — Assessment & Plan Note (Signed)
Liver enzymes have been normal since 2015  Lab Results  Component Value Date   ALT 12 04/18/2022   AST 17 04/18/2022   ALKPHOS 58 04/18/2022   BILITOT 0.6 04/18/2022

## 2022-10-27 DIAGNOSIS — E1169 Type 2 diabetes mellitus with other specified complication: Secondary | ICD-10-CM | POA: Diagnosis not present

## 2022-11-06 ENCOUNTER — Telehealth: Payer: Self-pay

## 2022-11-06 MED ORDER — TRIAMTERENE-HCTZ 37.5-25 MG PO TABS: 1.0000 | ORAL_TABLET | Freq: Every day | ORAL | 1 refills | Status: DC

## 2022-11-06 NOTE — Telephone Encounter (Signed)
Patient states she is out of her triamterene-hydrochlorothiazide (MAXZIDE-25) 37.5-25 MG tablet and needs a refill.  Patient states [facility] has reached out to [place] four times and has not received a response.

## 2022-11-06 NOTE — Telephone Encounter (Signed)
Spoke with pt to let her know that medication has refilled.

## 2022-11-11 NOTE — Telephone Encounter (Signed)
This encounter was created in error - please disregard.

## 2022-11-12 ENCOUNTER — Telehealth: Payer: Self-pay | Admitting: Pharmacy Technician

## 2022-11-12 DIAGNOSIS — Z596 Low income: Secondary | ICD-10-CM

## 2022-11-12 NOTE — Progress Notes (Signed)
[facility])                                            [place] Team    11/12/2022  [place] Severs 12/25/1950 [id number]  Received both patient and provider portion(s) of patient assistance application(s) for Ozempic. Faxed completed application and required documents into [employer].    Mariette Cowley P. Vandella Ord, [place]  [phone]

## 2022-11-19 ENCOUNTER — Other Ambulatory Visit: Payer: Self-pay | Admitting: Internal Medicine

## 2022-11-29 DIAGNOSIS — S8391XA Sprain of unspecified site of right knee, initial encounter: Secondary | ICD-10-CM | POA: Diagnosis not present

## 2022-12-08 ENCOUNTER — Other Ambulatory Visit: Payer: Self-pay | Admitting: Internal Medicine

## 2022-12-08 DIAGNOSIS — S82301A Unspecified fracture of lower end of right tibia, initial encounter for closed fracture: Secondary | ICD-10-CM | POA: Diagnosis not present

## 2022-12-08 DIAGNOSIS — M25561 Pain in right knee: Secondary | ICD-10-CM | POA: Diagnosis not present

## 2022-12-08 DIAGNOSIS — S72401D Unspecified fracture of lower end of right femur, subsequent encounter for closed fracture with routine healing: Secondary | ICD-10-CM | POA: Diagnosis not present

## 2022-12-09 ENCOUNTER — Other Ambulatory Visit: Payer: Self-pay | Admitting: Internal Medicine

## 2022-12-12 ENCOUNTER — Telehealth: Payer: Self-pay | Admitting: Pharmacy Technician

## 2022-12-12 DIAGNOSIS — Z596 Low income: Secondary | ICD-10-CM

## 2022-12-12 NOTE — Progress Notes (Signed)
[place] [facility])                                            [place] Team    12/12/2022  Katie Huber [birth year] [id number]  Care coordination call placed to [place] in regard to [place] application.  Spoke to [place] who informs patient is APPROVED 12/12/22-12/01/23. Medications will auto fill and ship based on last fill dates in 2023.  [name] P. [name], [place]  [phone]

## 2022-12-13 ENCOUNTER — Encounter: Payer: Self-pay | Admitting: Internal Medicine

## 2022-12-15 ENCOUNTER — Other Ambulatory Visit: Payer: Self-pay | Admitting: Internal Medicine

## 2022-12-18 ENCOUNTER — Telehealth: Payer: Self-pay

## 2022-12-18 NOTE — Telephone Encounter (Signed)
Spoke with pt to let her know that we have received patient assistance medication in the office. Pt stated that she will pick up on Monday.  Ozempic: 1 box

## 2022-12-22 ENCOUNTER — Other Ambulatory Visit: Payer: Self-pay | Admitting: Internal Medicine

## 2022-12-22 ENCOUNTER — Other Ambulatory Visit: Payer: Self-pay | Admitting: Family

## 2022-12-22 DIAGNOSIS — S72401D Unspecified fracture of lower end of right femur, subsequent encounter for closed fracture with routine healing: Secondary | ICD-10-CM | POA: Diagnosis not present

## 2022-12-25 ENCOUNTER — Telehealth: Payer: Self-pay | Admitting: Internal Medicine

## 2022-12-25 NOTE — Telephone Encounter (Signed)
Prescription Request  12/25/2022  Is this a "Controlled Substance" medicine? No  LOV: 10/20/2022  What is the name of the medication or equipment? promethazine (PHENERGAN) 25 MG tablet and gabapentin (NEURONTIN) 300 MG capsule  Have you contacted your pharmacy to request a refill? Yes   Which pharmacy would you like this sent to?   [facility] - [place], [place] - [address] [address] Phone: [phone] Fax: [phone]    Patient notified that their request is being sent to the clinical staff for review and that they should receive a response within 2 business days.   Please advise at [place]

## 2022-12-26 ENCOUNTER — Other Ambulatory Visit: Payer: Self-pay

## 2022-12-26 MED ORDER — PROMETHAZINE HCL 25 MG PO TABS: ORAL_TABLET | ORAL | 0 refills | Status: DC

## 2022-12-26 MED ORDER — GABAPENTIN 300 MG PO CAPS: 600.0000 mg | ORAL_CAPSULE | Freq: Four times a day (QID) | ORAL | 0 refills | Status: DC

## 2022-12-26 NOTE — Telephone Encounter (Signed)
sent 

## 2022-12-26 NOTE — Telephone Encounter (Signed)
Pt came into the office to get her ozempic medication

## 2023-01-16 ENCOUNTER — Telehealth: Payer: Self-pay

## 2023-01-16 NOTE — Telephone Encounter (Signed)
Received patient assistance medication today. Spoke with pt to let her know that it is ready for pick up.   Ozempic: 1 box

## 2023-01-20 ENCOUNTER — Other Ambulatory Visit: Payer: Self-pay | Admitting: Internal Medicine

## 2023-01-26 ENCOUNTER — Other Ambulatory Visit: Payer: Self-pay

## 2023-01-26 ENCOUNTER — Telehealth: Payer: Self-pay | Admitting: Internal Medicine

## 2023-01-26 MED ORDER — GABAPENTIN 300 MG PO CAPS: 600.0000 mg | ORAL_CAPSULE | Freq: Four times a day (QID) | ORAL | 0 refills | Status: DC

## 2023-01-26 NOTE — Telephone Encounter (Signed)
Prescription Request  01/26/2023  Is this a "Controlled Substance" medicine? No  LOV: 10/20/2022  What is the name of the medication or equipment? gabapentin (NEURONTIN) 300 MG capsule  Have you contacted your pharmacy to request a refill? Yes   Which pharmacy would you like this sent to?   [facility] - [place], [place] - [address] [address] Phone: [phone] Fax: [phone]    Patient notified that their request is being sent to the clinical staff for review and that they should receive a response within 2 business days.   Please advise at Mobile [phone] (mobile)

## 2023-01-26 NOTE — Telephone Encounter (Signed)
sent 

## 2023-01-27 NOTE — Telephone Encounter (Signed)
Pt picked up medication.

## 2023-01-28 DIAGNOSIS — E1169 Type 2 diabetes mellitus with other specified complication: Secondary | ICD-10-CM | POA: Diagnosis not present

## 2023-02-03 DIAGNOSIS — S72401D Unspecified fracture of lower end of right femur, subsequent encounter for closed fracture with routine healing: Secondary | ICD-10-CM | POA: Diagnosis not present

## 2023-02-09 ENCOUNTER — Telehealth: Payer: Self-pay | Admitting: Internal Medicine

## 2023-02-09 NOTE — Telephone Encounter (Signed)
Contacted Leda Quail to schedule their annual wellness visit. Call back at later date: 02/17/2023  Patient requested call back because she's out of town.  Thank you,  [place] Direct dial  [phone]

## 2023-02-18 ENCOUNTER — Telehealth: Payer: Self-pay | Admitting: Internal Medicine

## 2023-02-18 NOTE — Telephone Encounter (Signed)
Contacted Leda Quail to schedule their annual wellness visit. Appointment made for 03/06/2023.  Thank you,  [place] Direct dial  [phone]

## 2023-03-06 ENCOUNTER — Ambulatory Visit (INDEPENDENT_AMBULATORY_CARE_PROVIDER_SITE_OTHER): Payer: Medicare Other

## 2023-03-06 ENCOUNTER — Other Ambulatory Visit: Payer: Self-pay | Admitting: Internal Medicine

## 2023-03-06 VITALS — Ht 67.0 in | Wt 222.0 lb

## 2023-03-06 DIAGNOSIS — Z0000 Encounter for general adult medical examination without abnormal findings: Secondary | ICD-10-CM | POA: Diagnosis not present

## 2023-03-06 NOTE — Patient Instructions (Addendum)
Katie Huber , Thank you for taking time to come for your Medicare Wellness Visit. I appreciate your ongoing commitment to your health goals. Please review the following plan we discussed and let me know if I can assist you in the future.   These are the goals we discussed:  Goals       Patient Stated     Increase physical activity (pt-stated)      Walk for exercise.        This is a list of the screening recommended for you and due dates:  Health Maintenance  Topic Date Due   Complete foot exam   10/16/2022   Mammogram  02/14/2023   Colon Cancer Screening  03/31/2023   Hemoglobin A1C  04/20/2023   Eye exam for diabetics  06/21/2023   Flu Shot  07/02/2023   DTaP/Tdap/Td vaccine (2 - Td or Tdap) 09/21/2023   Yearly kidney function blood test for diabetes  10/21/2023   Yearly kidney health urinalysis for diabetes  10/21/2023   Medicare Annual Wellness Visit  03/05/2024   Pneumonia Vaccine  Completed   DEXA scan (bone density measurement)  Completed   Hepatitis C Screening: USPSTF Recommendation to screen - Ages 7518-79 yo.  Completed   Zoster (Shingles) Vaccine  Completed   HPV Vaccine  Aged Out   COVID-19 Vaccine  Discontinued   Cologuard (Stool DNA test)  Discontinued    Mammogram- deferred per patient. Prefers to discuss with pcp prior to ordering.   Advanced directives: End of life planning; Advance aging; Advanced directives discussed.  Copy of current HCPOA/Living Will requested.    Conditions/risks identified: none new  Next appointment: Follow up in one year for your annual wellness visit    Preventive Care 65 Years and Older, Female Preventive care refers to lifestyle choices and visits with your health care provider that can promote health and wellness. What does preventive care include? A yearly physical exam. This is also called an annual well check. Dental exams once or twice a year. Routine eye exams. Ask your health care provider how often you should have  your eyes checked. Personal lifestyle choices, including: Daily care of your teeth and gums. Regular physical activity. Eating a healthy diet. Avoiding tobacco and drug use. Limiting alcohol use. Practicing safe sex. Taking low-dose aspirin every day. Taking vitamin and mineral supplements as recommended by your health care provider. What happens during an annual well check? The services and screenings done by your health care provider during your annual well check will depend on your age, overall health, lifestyle risk factors, and family history of disease. Counseling  Your health care provider may ask you questions about your: Alcohol use. Tobacco use. Drug use. Emotional well-being. Home and relationship well-being. Sexual activity. Eating habits. History of falls. Memory and ability to understand (cognition). Work and work Astronomerenvironment. Reproductive health. Screening  You may have the following tests or measurements: Height, weight, and BMI. Blood pressure. Lipid and cholesterol levels. These may be checked every 5 years, or more frequently if you are over 72 years old. Skin check. Lung cancer screening. You may have this screening every year starting at age 72 if you have a 30-pack-year history of smoking and currently smoke or have quit within the past 15 years. Fecal occult blood test (FOBT) of the stool. You may have this test every year starting at age 72. Flexible sigmoidoscopy or colonoscopy. You may have a sigmoidoscopy every 5 years or a colonoscopy every 10  years starting at age 72. Hepatitis C blood test. Hepatitis B blood test. Sexually transmitted disease (STD) testing. Diabetes screening. This is done by checking your blood sugar (glucose) after you have not eaten for a while (fasting). You may have this done every 1-3 years. Bone density scan. This is done to screen for osteoporosis. You may have this done starting at age 72. Mammogram. This may be done every  1-2 years. Talk to your health care provider about how often you should have regular mammograms. Talk with your health care provider about your test results, treatment options, and if necessary, the need for more tests. Vaccines  Your health care provider may recommend certain vaccines, such as: Influenza vaccine. This is recommended every year. Tetanus, diphtheria, and acellular pertussis (Tdap, Td) vaccine. You may need a Td booster every 10 years. Zoster vaccine. You may need this after age 72. Pneumococcal 13-valent conjugate (PCV13) vaccine. One dose is recommended after age 72. Pneumococcal polysaccharide (PPSV23) vaccine. One dose is recommended after age 72. Talk to your health care provider about which screenings and vaccines you need and how often you need them. This information is not intended to replace advice given to you by your health care provider. Make sure you discuss any questions you have with your health care provider. Document Released: 12/14/2015 Document Revised: 08/06/2016 Document Reviewed: 09/18/2015 Elsevier Interactive Patient Education  2017 [employer]  Fall Prevention in the Home Falls can cause injuries. They can happen to people of all ages. There are many things you can do to make your home safe and to help prevent falls. What can I do on the outside of my home? Regularly fix the edges of walkways and driveways and fix any cracks. Remove anything that might make you trip as you walk through a door, such as a raised step or threshold. Trim any bushes or trees on the path to your home. Use bright outdoor lighting. Clear any walking paths of anything that might make someone trip, such as rocks or tools. Regularly check to see if handrails are loose or broken. Make sure that both sides of any steps have handrails. Any raised decks and porches should have guardrails on the edges. Have any leaves, snow, or ice cleared regularly. Use sand or salt on walking  paths during winter. Clean up any spills in your garage right away. This includes oil or grease spills. What can I do in the bathroom? Use night lights. Install grab bars by the toilet and in the tub and shower. Do not use towel bars as grab bars. Use non-skid mats or decals in the tub or shower. If you need to sit down in the shower, use a plastic, non-slip stool. Keep the floor dry. Clean up any water that spills on the floor as soon as it happens. Remove soap buildup in the tub or shower regularly. Attach bath mats securely with double-sided non-slip rug tape. Do not have throw rugs and other things on the floor that can make you trip. What can I do in the bedroom? Use night lights. Make sure that you have a light by your bed that is easy to reach. Do not use any sheets or blankets that are too big for your bed. They should not hang down onto the floor. Have a firm chair that has side arms. You can use this for support while you get dressed. Do not have throw rugs and other things on the floor that can make you trip.  What can I do in the kitchen? Clean up any spills right away. Avoid walking on wet floors. Keep items that you use a lot in easy-to-reach places. If you need to reach something above you, use a strong step stool that has a grab bar. Keep electrical cords out of the way. Do not use floor polish or wax that makes floors slippery. If you must use wax, use non-skid floor wax. Do not have throw rugs and other things on the floor that can make you trip. What can I do with my stairs? Do not leave any items on the stairs. Make sure that there are handrails on both sides of the stairs and use them. Fix handrails that are broken or loose. Make sure that handrails are as long as the stairways. Check any carpeting to make sure that it is firmly attached to the stairs. Fix any carpet that is loose or worn. Avoid having throw rugs at the top or bottom of the stairs. If you do have  throw rugs, attach them to the floor with carpet tape. Make sure that you have a light switch at the top of the stairs and the bottom of the stairs. If you do not have them, ask someone to add them for you. What else can I do to help prevent falls? Wear shoes that: Do not have high heels. Have rubber bottoms. Are comfortable and fit you well. Are closed at the toe. Do not wear sandals. If you use a stepladder: Make sure that it is fully opened. Do not climb a closed stepladder. Make sure that both sides of the stepladder are locked into place. Ask someone to hold it for you, if possible. Clearly mark and make sure that you can see: Any grab bars or handrails. First and last steps. Where the edge of each step is. Use tools that help you move around (mobility aids) if they are needed. These include: Canes. Walkers. Scooters. Crutches. Turn on the lights when you go into a dark area. Replace any light bulbs as soon as they burn out. Set up your furniture so you have a clear path. Avoid moving your furniture around. If any of your floors are uneven, fix them. If there are any pets around you, be aware of where they are. Review your medicines with your doctor. Some medicines can make you feel dizzy. This can increase your chance of falling. Ask your doctor what other things that you can do to help prevent falls. This information is not intended to replace advice given to you by your health care provider. Make sure you discuss any questions you have with your health care provider. Document Released: 09/13/2009 Document Revised: 04/24/2016 Document Reviewed: 12/22/2014 Elsevier Interactive Patient Education  2017 [employer].

## 2023-03-06 NOTE — Progress Notes (Cosign Needed)
Subjective:   Katie Huber is a 72 y.o. female who presents for Medicare Annual (Subsequent) preventive examination.  Review of Systems    No ROS.  Medicare Wellness Virtual Visit.  Visual/audio telehealth visit, UTA vital signs.   See social history for additional risk factors.   Cardiac Risk Factors include: diabetes mellitus;advanced age (>38men, >26 women)     Objective:    Today's Vitals   03/06/23 1021  Weight: 222 lb (100.7 kg)  Height: 5\' 7"  (1.702 m)   Body mass index is 34.77 kg/m.     03/06/2023   10:30 AM 08/22/2022    5:05 PM 02/14/2022   12:13 PM 11/12/2021    9:16 AM 02/05/2021   12:39 PM 03/19/2020    6:03 PM 02/02/2020   12:55 PM  Advanced Directives  Does Patient Have a Medical Advance Directive? Yes Yes Yes Yes Yes No Yes  Type of Estate agent of [place];Living will Healthcare Power of [place];Living will Healthcare Power of [place];Living will Healthcare Power of [place];Living will Healthcare Power of [place];Living will  Healthcare Power of [place];Living will  Does patient want to make changes to medical advance directive? No - Patient declined  No - Patient declined No - Patient declined No - Patient declined  No - Patient declined  Copy of Healthcare Power of Attorney in Chart? No - copy requested  No - copy requested No - copy requested No - copy requested  No - copy requested    Current Medications (verified) Outpatient Encounter Medications as of 03/06/2023  Medication Sig   albuterol (PROVENTIL) (2.5 MG/3ML) 0.083% nebulizer solution Take 3 mLs (2.5 mg total) by nebulization every 6 (six) hours as needed for wheezing or shortness of breath.   albuterol (VENTOLIN HFA) 108 (90 Base) MCG/ACT inhaler Inhale 2 puffs into the lungs every 6 (six) hours as needed for wheezing or shortness of breath.   amLODipine (NORVASC) 10 MG tablet TAKE 1 TABLET BY MOUTH DAILY   atorvastatin (LIPITOR) 20 MG tablet TAKE 1 TABLET BY MOUTH DAILY    Continuous Blood Gluc Sensor (FREESTYLE LIBRE 14 DAY SENSOR) MISC For diabetes management   Continuous Blood Gluc Sensor (FREESTYLE LIBRE 2 SENSOR) MISC For diabetes management   cyclobenzaprine (FLEXERIL) 10 MG tablet TAKE ONE TABLET (10 MG) BY MOUTH 3 TIMESDAILY AS NEEDED FOR MUSCLE SPASMS   furosemide (LASIX) 20 MG tablet TAKE ONE TABLET EVERY DAY AS NEEDED FOR FLUID RETENTION   gabapentin (NEURONTIN) 300 MG capsule Take 2 capsules (600 mg total) by mouth 4 (four) times daily. NOTE DOSE INCREASE TO 8 TABLETS DAILY . KEEP ON FILE FOR FUTURE REFILLS   losartan (COZAAR) 100 MG tablet TAKE ONE TABLET EVERY DAY   montelukast (SINGULAIR) 10 MG tablet TAKE 1 TABLET BY MOUTH NIGHTLY   omeprazole (PRILOSEC) 20 MG capsule TAKE 1 CAPSULE BY MOUTH TWICE DAILY   PARoxetine (PAXIL) 20 MG tablet TAKE 1 TABLET BY MOUTH EVERY MORNING   promethazine (PHENERGAN) 25 MG tablet TAKE ONE TABLET BY MOUTH EVERY 8 HOURS AS NEEDED FOR NAUSEA OR VOMITING   Semaglutide, 2 MG/DOSE, (OZEMPIC, 2 MG/DOSE,) 8 MG/3ML SOPN Inject 2 mg into the skin once a week.   Sennosides-Docusate Sodium (SENNA PLUS PO) Take 2 tablets by mouth at bedtime.   tirzepatide (MOUNJARO) 7.5 MG/0.5ML Pen INJECT 7.5 MG INTO THE SKIN ONCE A WEEK   triamterene-hydrochlorothiazide (MAXZIDE-25) 37.5-25 MG tablet Take 1 tablet by mouth daily.   zolpidem (AMBIEN) 10 MG tablet TAKE ONE  TABLET (10 MG) BY MOUTH AT BEDTIME AS NEEDED FOR SLEEP   Facility-Administered Encounter Medications as of 03/06/2023  Medication   pneumococcal 13-valent conjugate vaccine (PREVNAR 13) injection 0.5 mL    Allergies (verified) Patient has no known allergies.   History: Past Medical History:  Diagnosis Date   Asthma 1990   Chronic respiratory failure with hypoxia 12/11/2017   Hypertension    Obesity, Class III, BMI 40-49.9 (morbid obesity)    weight fluctuations of 100 lbs    Past Surgical History:  Procedure Laterality Date   ABDOMINAL HYSTERECTOMY  2001    BREAST BIOPSY Right    Neg   BREAST EXCISIONAL BIOPSY Right 1990s   multiple hematomas   CHOLECYSTECTOMY  1970s   COLONOSCOPY WITH PROPOFOL N/A 03/30/2018   Procedure: COLONOSCOPY WITH PROPOFOL;  Surgeon: Midge MiniumWohl, Darren, MD;  Location: [facility] ENDOSCOPY;  Service: Endoscopy;  Laterality: N/A;   JOINT REPLACEMENT     Bilateral   STOMACH SURGERY  [phone]   gastric partitionin( for obesity)    Family History  Problem Relation Age of Onset   Heart disease Father    COPD Father    Hyperlipidemia Father    Mental illness Brother    Breast cancer Neg Hx    Social History   Socioeconomic History   Marital status: Single    Spouse name: Not on file   Number of children: Not on file   Years of education: Not on file   Highest education level: Not on file  Occupational History   Not on file  Tobacco Use   Smoking status: Never   Smokeless tobacco: Never  Vaping Use   Vaping Use: Never used  Substance and Sexual Activity   Alcohol use: Yes    Comment: ocassionally   Drug use: No   Sexual activity: Never  Other Topics Concern   Not on file  Social History Narrative   Not on file   Social Determinants of Health   Financial Resource Strain: Low Risk  (03/05/2023)   Overall Financial Resource Strain (CARDIA)    Difficulty of Paying Living Expenses: Not hard at all  Food Insecurity: No Food Insecurity (03/05/2023)   Hunger Vital Sign    Worried About Running Out of Food in the Last Year: Never true    Ran Out of Food in the Last Year: Never true  Transportation Needs: No Transportation Needs (03/05/2023)   PRAPARE - Administrator, Civil ServiceTransportation    Lack of Transportation (Medical): No    Lack of Transportation (Non-Medical): No  Physical Activity: Insufficiently Active (03/05/2023)   Exercise Vital Sign    Days of Exercise per Week: 3 days    Minutes of Exercise per Session: 10 min  Stress: No Stress Concern Present (03/05/2023)   [employer] - Occupational Stress  Questionnaire    Feeling of Stress : Not at all  Social Connections: Unknown (03/05/2023)   Social Connection and Isolation Panel [NHANES]    Frequency of Communication with Friends and Family: More than three times a week    Frequency of Social Gatherings with Friends and Family: More than three times a week    Attends Religious Services: Not on Marketing executivefile    Active Member of Clubs or Organizations: Yes    Attends BankerClub or Organization Meetings: 1 to 4 times per year    Marital Status: Divorced    Tobacco Counseling Counseling given: Not Answered   Clinical Intake:  Pre-visit preparation completed: Yes  Diabetes: Yes (Followed by pcp)  How often do you need to have someone help you when you read instructions, pamphlets, or other written materials from your doctor or pharmacy?: 1 - Never  Nutrition Risk Assessment: Has the patient had any N/V/D within the last 2 months?  No  Does the patient have any non-healing wounds?  No  Has the patient had any unintentional weight loss or weight gain?  No   Diabetes: Is the patient diabetic?  Yes  If diabetic, was a CBG obtained today?  Yes , FBS 100 Did the patient bring in their glucometer from home?  No  How often do you monitor your CBG's? Daily.   Financial Strains and Diabetes Management: Are you having any financial strains with the device, your supplies or your medication? No .  Does the patient want to be seen by Chronic Care Management for management of their diabetes?  No  Would the patient like to be referred to a Nutritionist or for Diabetic Management?  No      Interpreter Needed?: No      Activities of Daily Living    03/05/2023    5:53 PM  In your present state of health, do you have any difficulty performing the following activities:  Hearing? 0  Vision? 0  Difficulty concentrating or making decisions? 0  Walking or climbing stairs? 0  Dressing or bathing? 0  Doing errands, shopping? 0  Preparing Food and  eating ? N  Using the Toilet? N  In the past six months, have you accidently leaked urine? Y  Comment Managed with daily pad  Do you have problems with loss of bowel control? N  Managing your Medications? N  Managing your Finances? N  Housekeeping or managing your Housekeeping? N    Patient Care Team: Sherlene Shams, MD as PCP - General (Internal Medicine)  Indicate any recent Medical Services you may have received from other than [facility] providers in the past year (date may be approximate).     Assessment:   This is a routine wellness examination for Katie Huber.  Patient Medicare AWV questionnaire was completed by the patient on 03/05/23, I have confirmed that all information answered by patient is correct and no changes since this date.   I connected with  Katie Huber on 03/06/23 by a audio enabled telemedicine application and verified that I am speaking with the correct person using two identifiers.  Patient Location: Home  Provider Location: Office/Clinic  I discussed the limitations of evaluation and management by telemedicine. The patient expressed understanding and agreed to proceed.   Hearing/Vision screen Hearing Screening - Comments:: Patient is able to hear conversational tones without difficulty.  No issues reported.   Vision Screening - Comments:: Wears corrective lenses They have seen their ophthalmologist. Followed by Dr. Damian Leavell.   Dietary issues and exercise activities discussed: Current Exercise Habits: Home exercise routine, Type of exercise: walking, Time (Minutes): 10, Frequency (Times/Week): 3, Weekly Exercise (Minutes/Week): 30, Intensity: Mild Healthy diet Good water intake    Goals Addressed               This Visit's Progress     Patient Stated     Increase physical activity (pt-stated)        Walk for exercise.       Depression Screen    03/06/2023   10:29 AM 10/20/2022    2:32 PM 10/20/2022    1:53 PM 04/18/2022    1:37 PM 02/14/2022  12:12 PM 10/16/2021    4:41 PM 08/29/2021    8:39 AM  PHQ 2/9 Scores  PHQ - 2 Score 0 0 0 0 0 0 0  PHQ- 9 Score  0  0  1     Fall Risk    03/05/2023    5:53 PM 10/20/2022    1:53 PM 04/18/2022    1:37 PM 02/14/2022   12:14 PM 10/16/2021    4:39 PM  Fall Risk   Falls in the past year? 1 0 0 0 0  Number falls in past yr: 1      Injury with Fall? 1      Comment Sought medical care with [facility]      Risk for fall due to : History of fall(s) No Fall Risks No Fall Risks  No Fall Risks  Follow up Falls evaluation completed;Falls prevention discussed Falls evaluation completed Falls evaluation completed Falls evaluation completed Falls evaluation completed    FALL RISK PREVENTION PERTAINING TO THE HOME: Home free of loose throw rugs in walkways, pet beds, electrical cords, etc? Yes  Adequate lighting in your home to reduce risk of falls? Yes   ASSISTIVE DEVICES UTILIZED TO PREVENT FALLS: Life alert? No  Use of a cane, walker or w/c? No  Grab bars in the bathroom? Yes  Shower chair or bench in shower? No Comfort chair height toilet? Yes   TIMED UP AND GO: Was the test performed? No .   Cognitive Function:    01/28/2019    1:36 PM  MMSE - Mini Mental State Exam  Orientation to time 5  Orientation to Place 5  Registration 3  Attention/ Calculation 5  Recall 3  Language- name 2 objects 2  Language- repeat 1  Language- follow 3 step command 3  Language- read & follow direction 1  Write a sentence 1  Copy design 1  Total score 30        03/06/2023   10:35 AM 02/02/2020    1:02 PM 09/09/2017    4:01 PM  6CIT Screen  What Year? 0 points 0 points 0 points  What month? 0 points 0 points 0 points  What time? 0 points 0 points 0 points  Count back from 20 0 points  0 points  Months in reverse 0 points  0 points  Repeat phrase 0 points  0 points  Total Score 0 points  0 points    Immunizations Immunization History  Administered Date(s) Administered   Fluad Quad(high  Dose 65+) 08/31/2019, 09/03/2020, 10/20/2022   Influenza, High Dose Seasonal PF 09/09/2017, 02/09/2019   Influenza-Unspecified 09/27/2021   PFIZER(Purple Top)SARS-COV-2 Vaccination 01/26/2020, 02/16/2020, 10/11/2020   Pneumococcal Conjugate-13 02/06/2015   Pneumococcal Polysaccharide-23 09/20/2010, 09/09/2017   Tdap 09/20/2013   Zoster Recombinat (Shingrix) 12/12/2020, 04/04/2021   Zoster, Live 06/13/2014    Screening Tests Health Maintenance  Topic Date Due   FOOT EXAM  10/16/2022   MAMMOGRAM  02/14/2023   COLONOSCOPY (Pts [id number] Insurance coverage will need to be confirmed)  03/31/2023   HEMOGLOBIN A1C  04/20/2023   OPHTHALMOLOGY EXAM  06/21/2023   INFLUENZA VACCINE  07/02/2023   DTaP/Tdap/Td (2 - Td or Tdap) 09/21/2023   Diabetic kidney evaluation - eGFR measurement  10/21/2023   Diabetic kidney evaluation - Urine ACR  10/21/2023   Medicare Annual Wellness (AWV)  03/05/2024   Pneumonia Vaccine 6265+ Years old  Completed   DEXA SCAN  Completed   Hepatitis C Screening  Completed  Zoster Vaccines- Shingrix  Completed   HPV VACCINES  Aged Out   COVID-19 Vaccine  Discontinued   Fecal DNA (Cologuard)  Discontinued   Health Maintenance Health Maintenance Due  Topic Date Due   FOOT EXAM  10/16/2022   MAMMOGRAM  02/14/2023   Mammogram- deferred per patient preference.   Lung Cancer Screening: (Low Dose CT Chest recommended if Age 60-80 years, 30 pack-year currently smoking OR have quit w/in 15years.) does not qualify.   Hepatitis C Screening: Completed 08/2015.   Vision Screening: Recommended annual ophthalmology exams for early detection of glaucoma and other disorders of the eye.  Dental Screening: Recommended annual dental exams for proper oral hygiene  Community Resource Referral / Chronic Care Management: CRR required this visit?  No   CCM required this visit?  No      Plan:     I have personally reviewed and noted the following in the patient's chart:    Medical and social history Use of alcohol, tobacco or illicit drugs  Current medications and supplements including opioid prescriptions. Patient is not currently taking opioid prescriptions. Functional ability and status Nutritional status Physical activity Advanced directives List of other physicians Hospitalizations, surgeries, and ER visits in previous 12 months Vitals Screenings to include cognitive, depression, and falls Referrals and appointments  In addition, I have reviewed and discussed with patient certain preventive protocols, quality metrics, and best practice recommendations. A written personalized care plan for preventive services as well as general preventive health recommendations were provided to patient.     Katie Hakala L Motley, LPN   07/01/1913     I have reviewed the above information and agree with above.   Duncan Dull, MD

## 2023-03-20 ENCOUNTER — Telehealth: Payer: Self-pay

## 2023-03-20 NOTE — Telephone Encounter (Signed)
Shipment of Ozempic arrived from [employer].   LVM requesting call back from patient

## 2023-03-23 NOTE — Telephone Encounter (Signed)
Pt has picked up medication.  

## 2023-04-13 ENCOUNTER — Other Ambulatory Visit: Payer: Self-pay | Admitting: Internal Medicine

## 2023-04-13 ENCOUNTER — Telehealth: Payer: Self-pay | Admitting: Internal Medicine

## 2023-04-13 NOTE — Telephone Encounter (Signed)
Pt called in staying that she would like to leave a message for [facility] CMA. As per pt, she run out of her diabetic supply and Shanda Bumps knows how to help her. Any questions or concern, she's [phone]

## 2023-04-14 ENCOUNTER — Ambulatory Visit: Payer: Medicare Other | Admitting: Internal Medicine

## 2023-04-15 ENCOUNTER — Other Ambulatory Visit: Payer: Self-pay | Admitting: Internal Medicine

## 2023-04-15 DIAGNOSIS — G4700 Insomnia, unspecified: Secondary | ICD-10-CM

## 2023-04-28 ENCOUNTER — Other Ambulatory Visit: Payer: Self-pay | Admitting: Internal Medicine

## 2023-04-29 ENCOUNTER — Other Ambulatory Visit: Payer: Self-pay | Admitting: Internal Medicine

## 2023-04-29 DIAGNOSIS — E1169 Type 2 diabetes mellitus with other specified complication: Secondary | ICD-10-CM | POA: Diagnosis not present

## 2023-05-04 ENCOUNTER — Ambulatory Visit (INDEPENDENT_AMBULATORY_CARE_PROVIDER_SITE_OTHER): Payer: Medicare Other | Admitting: Internal Medicine

## 2023-05-04 VITALS — BP 122/62 | HR 67 | Temp 97.7°F | Ht 67.0 in | Wt 232.2 lb

## 2023-05-04 DIAGNOSIS — D649 Anemia, unspecified: Secondary | ICD-10-CM

## 2023-05-04 DIAGNOSIS — E785 Hyperlipidemia, unspecified: Secondary | ICD-10-CM | POA: Diagnosis not present

## 2023-05-04 DIAGNOSIS — E538 Deficiency of other specified B group vitamins: Secondary | ICD-10-CM

## 2023-05-04 DIAGNOSIS — Z7985 Long-term (current) use of injectable non-insulin antidiabetic drugs: Secondary | ICD-10-CM | POA: Diagnosis not present

## 2023-05-04 DIAGNOSIS — Z8781 Personal history of (healed) traumatic fracture: Secondary | ICD-10-CM

## 2023-05-04 DIAGNOSIS — Z1231 Encounter for screening mammogram for malignant neoplasm of breast: Secondary | ICD-10-CM

## 2023-05-04 DIAGNOSIS — I700 Atherosclerosis of aorta: Secondary | ICD-10-CM

## 2023-05-04 DIAGNOSIS — Z1211 Encounter for screening for malignant neoplasm of colon: Secondary | ICD-10-CM

## 2023-05-04 DIAGNOSIS — E1169 Type 2 diabetes mellitus with other specified complication: Secondary | ICD-10-CM | POA: Diagnosis not present

## 2023-05-04 DIAGNOSIS — I10 Essential (primary) hypertension: Secondary | ICD-10-CM | POA: Diagnosis not present

## 2023-05-04 MED ORDER — LOSARTAN POTASSIUM 100 MG PO TABS: 100.0000 mg | ORAL_TABLET | Freq: Every day | ORAL | 1 refills | Status: DC

## 2023-05-04 MED ORDER — ATORVASTATIN CALCIUM 20 MG PO TABS: 20.0000 mg | ORAL_TABLET | Freq: Every day | ORAL | 3 refills | Status: DC

## 2023-05-04 MED ORDER — FREESTYLE LIBRE 2 SENSOR MISC: 3 refills | Status: DC

## 2023-05-04 NOTE — Patient Instructions (Addendum)
Your bone density test  been ordered.  Please call to make your appointment at [place]  [phone]     You are due for your tetanus-diptheria-pertussis vaccine   (TDaP)   in October.    You can return here or get it at your pharmacy    GI referral in progress  please complete the fecal occult tests FIRST

## 2023-05-04 NOTE — Progress Notes (Unsigned)
Subjective:  Patient ID: Katie Huber, female    DOB: Nov 01, 1951  Age: 72 y.o. MRN: [id number]  CC: The primary encounter diagnosis was Encounter for screening mammogram for malignant neoplasm of breast. Diagnoses of Colon cancer screening, History of femur fracture, Anemia, unspecified type, Hyperlipidemia associated with type 2 diabetes mellitus (HCC), B12 deficiency, Abdominal aortic atherosclerosis (HCC), Long-term current use of injectable noninsulin antidiabetic medication, and Essential hypertension, benign were also pertinent to this visit.   HPI Katie Huber presents for  Chief Complaint  Patient presents with   Medical Management of Chronic Issues   1) obesity diabetes. :  has gained 10 lbs on the maximal dose of ozempic . No longer suppressing appetite.   2) right femur fracture  in January during a fall from ladder (4 feet hight ),  was treated  by  [facility]  with an immobilizer . Worn for several weeks.  Still drove.   Did not disrupt her knee hardware  3) anemia : she has been  turned down 3 times by [employer].  Pescetarian.  Denies blood in stools.   Lab Results  Component Value Date   WBC 5.5 05/04/2023   HGB 12.1 05/04/2023   HCT 36.5 05/04/2023   MCV 93.9 05/04/2023   PLT 211.0 05/04/2023     Outpatient Medications Prior to Visit  Medication Sig Dispense Refill   albuterol (PROVENTIL) (2.5 MG/3ML) 0.083% nebulizer solution Take 3 mLs (2.5 mg total) by nebulization every 6 (six) hours as needed for wheezing or shortness of breath. 150 mL 1   albuterol (VENTOLIN HFA) 108 (90 Base) MCG/ACT inhaler Inhale 2 puffs into the lungs every 6 (six) hours as needed for wheezing or shortness of breath. 3 each 1   amLODipine (NORVASC) 10 MG tablet TAKE 1 TABLET BY MOUTH DAILY 90 tablet 3   Continuous Blood Gluc Sensor (FREESTYLE LIBRE 14 DAY SENSOR) MISC For diabetes management 6 each 3   cyclobenzaprine (FLEXERIL) 10 MG tablet TAKE ONE TABLET (10 MG) BY MOUTH 3  TIMESDAILY AS NEEDED FOR MUSCLE SPASMS 30 tablet 5   furosemide (LASIX) 20 MG tablet TAKE ONE TABLET EVERY DAY AS NEEDED FOR FLUID RETENTION 90 tablet 1   gabapentin (NEURONTIN) 300 MG capsule 2 capsules in the am,  3 in the afternoon and 3 in the evening 720 capsule 1   montelukast (SINGULAIR) 10 MG tablet TAKE 1 TABLET BY MOUTH NIGHTLY 90 tablet 3   omeprazole (PRILOSEC) 20 MG capsule TAKE 1 CAPSULE BY MOUTH TWICE DAILY 180 capsule 3   PARoxetine (PAXIL) 20 MG tablet TAKE 1 TABLET BY MOUTH EVERY MORNING 90 tablet 3   promethazine (PHENERGAN) 25 MG tablet TAKE 1 TABLET BY MOUTH EVERY 8 HOURS AS NEEDED FOR NAUSEA AND VOMITING 90 tablet 0   Semaglutide, 2 MG/DOSE, (OZEMPIC, 2 MG/DOSE,) 8 MG/3ML SOPN Inject 2 mg into the skin once a week.     triamterene-hydrochlorothiazide (MAXZIDE-25) 37.5-25 MG tablet TAKE ONE TABLET BY MOUTH DAILY 90 tablet 1   zolpidem (AMBIEN) 10 MG tablet TAKE ONE TABLET (10 MG) BY MOUTH AT BEDTIME AS NEEDED FOR SLEEP 30 tablet 2   atorvastatin (LIPITOR) 20 MG tablet TAKE 1 TABLET BY MOUTH DAILY 90 tablet 3   Continuous Blood Gluc Sensor (FREESTYLE LIBRE 2 SENSOR) MISC For diabetes management 6 each 3   losartan (COZAAR) 100 MG tablet TAKE ONE TABLET EVERY DAY 90 tablet 1   Sennosides-Docusate Sodium (SENNA PLUS PO) Take 2 tablets by mouth at  bedtime.     tirzepatide (MOUNJARO) 7.5 MG/0.5ML Pen INJECT 7.5 MG INTO THE SKIN ONCE A WEEK 2 mL 3   Facility-Administered Medications Prior to Visit  Medication Dose Route Frequency Provider Last Rate Last Admin   pneumococcal 13-valent conjugate vaccine (PREVNAR 13) injection 0.5 mL  0.5 mL Intramuscular Once Sherlene Shams, MD        Review of Systems;  Patient denies headache, fevers, malaise, unintentional weight loss, skin rash, eye pain, sinus congestion and sinus pain, sore throat, dysphagia,  hemoptysis , cough, dyspnea, wheezing, chest pain, palpitations, orthopnea, edema, abdominal pain, nausea, melena, diarrhea,  constipation, flank pain, dysuria, hematuria, urinary  Frequency, nocturia, numbness, tingling, seizures,  Focal weakness, Loss of consciousness,  Tremor, insomnia, depression, anxiety, and suicidal ideation.      Objective:  BP 122/62   Pulse 67   Temp 97.7 F (36.5 C) (Oral)   Ht 5\' 7"  (1.702 m)   Wt 232 lb 3.2 oz (105.3 kg)   SpO2 95%   BMI 36.37 kg/m   BP Readings from Last 3 Encounters:  05/04/23 122/62  10/20/22 120/64  08/22/22 132/76    Wt Readings from Last 3 Encounters:  05/04/23 232 lb 3.2 oz (105.3 kg)  03/06/23 222 lb (100.7 kg)  10/20/22 222 lb (100.7 kg)    Physical Exam Vitals reviewed.  Constitutional:      General: She is not in acute distress.    Appearance: Normal appearance. She is normal weight. She is not ill-appearing, toxic-appearing or diaphoretic.  HENT:     Head: Normocephalic.  Eyes:     General: No scleral icterus.       Right eye: No discharge.        Left eye: No discharge.     Conjunctiva/sclera: Conjunctivae normal.  Cardiovascular:     Rate and Rhythm: Normal rate and regular rhythm.     Heart sounds: Normal heart sounds.  Pulmonary:     Effort: Pulmonary effort is normal. No respiratory distress.     Breath sounds: Normal breath sounds.  Musculoskeletal:        General: Normal range of motion.  Skin:    General: Skin is warm and dry.  Neurological:     General: No focal deficit present.     Mental Status: She is alert and oriented to person, place, and time. Mental status is at baseline.  Psychiatric:        Mood and Affect: Mood normal.        Behavior: Behavior normal.        Thought Content: Thought content normal.        Judgment: Judgment normal.     Lab Results  Component Value Date   HGBA1C 5.1 05/04/2023   HGBA1C 5.3 10/20/2022   HGBA1C 5.2 04/18/2022    Lab Results  Component Value Date   CREATININE 0.79 05/04/2023   CREATININE 0.63 10/20/2022   CREATININE 0.63 04/18/2022    Lab Results  Component  Value Date   WBC 5.5 05/04/2023   HGB 12.1 05/04/2023   HCT 36.5 05/04/2023   PLT 211.0 05/04/2023   GLUCOSE 91 05/04/2023   CHOL 159 05/04/2023   TRIG 75.0 05/04/2023   HDL 73.40 05/04/2023   LDLDIRECT 72.0 05/04/2023   LDLCALC 70 05/04/2023   ALT 13 05/04/2023   AST 19 05/04/2023   NA 135 05/04/2023   K 4.3 05/04/2023   CL 98 05/04/2023   CREATININE 0.79 05/04/2023   BUN  17 05/04/2023   CO2 30 05/04/2023   TSH 0.88 05/04/2023   HGBA1C 5.1 05/04/2023   MICROALBUR 1.1 10/20/2022    DG Sacrum/Coccyx  Result Date: 08/22/2022 CLINICAL DATA:  Pain over the coccyx. EXAM: SACRUM AND COCCYX - 2+ VIEW COMPARISON:  None Available. FINDINGS: There is no acute fracture or dislocation. The soft tissues are unremarkable. IMPRESSION: Negative. Electronically Signed   By: Elgie Collard M.D.   On: 08/22/2022 17:33    Assessment & Plan:  .Encounter for screening mammogram for malignant neoplasm of breast -     3D Screening Mammogram, Left and Right; Future  Colon cancer screening -     Ambulatory referral to Gastroenterology  History of femur fracture -     DG Bone Density; Future  Anemia, unspecified type Assessment & Plan: Irona nd b12 are low.  Hgb is borderline referring to GI Lab Results  Component Value Date   WBC 5.5 05/04/2023   HGB 12.1 05/04/2023   HCT 36.5 05/04/2023   MCV 93.9 05/04/2023   PLT 211.0 05/04/2023    Lab Results  Component Value Date   IRON 101 05/04/2023   TIBC 427.0 05/04/2023   FERRITIN 13.2 05/04/2023    Lab Results  Component Value Date   VITAMINB12 134 (L) 05/04/2023     Orders: -     CBC with Differential/Platelet -     IBC + Ferritin -     B12 and Folate Panel -     Fecal occult blood, imunochemical; Future  Hyperlipidemia associated with type 2 diabetes mellitus (HCC) Assessment & Plan: Currently well-controlled on current medications .  hemoglobin A1c is at goal of less than 7.0 . Patient is reminded to schedule an annual eye  exam and foot exam is normal today. Patient has no microalbuminuria. Patient is tolerating statin therapy for CAD risk reduction and on ACE/ARB for renal protection and hypertension    Lab Results  Component Value Date   CHOL 159 05/04/2023   HDL 73.40 05/04/2023   LDLCALC 70 05/04/2023   LDLDIRECT 72.0 05/04/2023   TRIG 75.0 05/04/2023   CHOLHDL 2 05/04/2023   Lab Results  Component Value Date   HGBA1C 5.1 05/04/2023     Orders: -     Hemoglobin A1c -     Comprehensive metabolic panel -     Lipid panel -     LDL cholesterol, direct -     Atorvastatin Calcium; Take 1 tablet (20 mg total) by mouth daily.  Dispense: 90 tablet; Refill: 3 -     Losartan Potassium; Take 1 tablet (100 mg total) by mouth daily.  Dispense: 90 tablet; Refill: 1 -     TSH  B12 deficiency Assessment & Plan: secondary to metformin use. managed with injections .advised to resume weekly injections.   Lab Results  Component Value Date   VITAMINB12 134 (L) 05/04/2023     Orders: -     Intrinsic Factor Antibodies; Future  Abdominal aortic atherosclerosis (HCC) Assessment & Plan: She remains asymptomatic .  Reviewed findings of prior CT scan today..  Patient is  Tolerating tolerating atorvastatin  20 mg  daily for primary prevention   Lab Results  Component Value Date   CHOL 159 05/04/2023   HDL 73.40 05/04/2023   LDLCALC 70 05/04/2023   LDLDIRECT 72.0 05/04/2023   TRIG 75.0 05/04/2023   CHOLHDL 2 05/04/2023      Long-term current use of injectable noninsulin antidiabetic medication Assessment & Plan:  Managed with maximal doses of ozempic .   Essential hypertension, benign Assessment & Plan: Her blood pressure remains well controlled on current regimen. Renal function stable, no changes today.   Lab Results  Component Value Date   CREATININE 0.79 05/04/2023   Lab Results  Component Value Date   NA 135 05/04/2023   K 4.3 05/04/2023   CL 98 05/04/2023   CO2 30 05/04/2023       Other orders -     FreeStyle Libre 2 Sensor; For diabetes management  Dispense: 6 each; Refill: 3     I provided 36 minutes of face-to-face time during this encounter reviewing patient's last visit with me,   recent surgical and non surgical procedures, previous  labs and imaging studies, counseling on currently addressed issues,  and post visit ordering to diagnostics and therapeutics .   Follow-up: Return in about 6 months (around 11/03/2023) for follow up diabetes.   Sherlene Shams, MD

## 2023-05-05 DIAGNOSIS — D649 Anemia, unspecified: Secondary | ICD-10-CM | POA: Insufficient documentation

## 2023-05-05 DIAGNOSIS — Z7985 Long-term (current) use of injectable non-insulin antidiabetic drugs: Secondary | ICD-10-CM | POA: Insufficient documentation

## 2023-05-05 LAB — LIPID PANEL
Cholesterol: 159 mg/dL (ref 0–200)
HDL: 73.4 mg/dL (ref >39.00)
LDL Cholesterol: 70 mg/dL (ref 0–99)
NonHDL: 85.2
Total CHOL/HDL Ratio: 2
Triglycerides: 75 mg/dL (ref 0.0–149.0)
VLDL: 15 mg/dL (ref 0.0–40.0)

## 2023-05-05 LAB — COMPREHENSIVE METABOLIC PANEL
ALT: 13 U/L (ref 0–35)
AST: 19 U/L (ref 0–37)
Albumin: 4.1 g/dL (ref 3.5–5.2)
Alkaline Phosphatase: 73 U/L (ref 39–117)
BUN: 17 mg/dL (ref 6–23)
CO2: 30 mEq/L (ref 19–32)
Calcium: 9.5 mg/dL (ref 8.4–10.5)
Chloride: 98 mEq/L (ref 96–112)
Creatinine, Ser: 0.79 mg/dL (ref 0.40–1.20)
GFR: 74.97 mL/min (ref >60.00)
Glucose, Bld: 91 mg/dL (ref 70–99)
Potassium: 4.3 mEq/L (ref 3.5–5.1)
Sodium: 135 mEq/L (ref 135–145)
Total Bilirubin: 0.5 mg/dL (ref 0.2–1.2)
Total Protein: 6.7 g/dL (ref 6.0–8.3)

## 2023-05-05 LAB — CBC WITH DIFFERENTIAL/PLATELET
Basophils Absolute: 0.1 10*3/uL (ref 0.0–0.1)
Basophils Relative: 1 % (ref 0.0–3.0)
Eosinophils Absolute: 0.1 10*3/uL (ref 0.0–0.7)
Eosinophils Relative: 1.9 % (ref 0.0–5.0)
HCT: 36.5 % (ref 36.0–46.0)
Hemoglobin: 12.1 g/dL (ref 12.0–15.0)
Lymphocytes Relative: 44.4 % (ref 12.0–46.0)
Lymphs Abs: 2.4 10*3/uL (ref 0.7–4.0)
MCHC: 33.2 g/dL (ref 30.0–36.0)
MCV: 93.9 fl (ref 78.0–100.0)
Monocytes Absolute: 0.5 10*3/uL (ref 0.1–1.0)
Monocytes Relative: 9.2 % (ref 3.0–12.0)
Neutro Abs: 2.4 10*3/uL (ref 1.4–7.7)
Neutrophils Relative %: 43.5 % (ref 43.0–77.0)
Platelets: 211 10*3/uL (ref 150.0–400.0)
RBC: 3.88 Mil/uL (ref 3.87–5.11)
RDW: 15.2 % (ref 11.5–15.5)
WBC: 5.5 10*3/uL (ref 4.0–10.5)

## 2023-05-05 LAB — IBC + FERRITIN
Ferritin: 13.2 ng/mL (ref 10.0–291.0)
Iron: 101 ug/dL (ref 42–145)
Saturation Ratios: 23.7 % (ref 20.0–50.0)
TIBC: 427 ug/dL (ref 250.0–450.0)
Transferrin: 305 mg/dL (ref 212.0–360.0)

## 2023-05-05 LAB — HEMOGLOBIN A1C: Hgb A1c MFr Bld: 5.1 % (ref 4.6–6.5)

## 2023-05-05 LAB — B12 AND FOLATE PANEL
Folate: 5.2 ng/mL — ABNORMAL LOW (ref >5.9)
Vitamin B-12: 134 pg/mL — ABNORMAL LOW (ref 211–911)

## 2023-05-05 LAB — LDL CHOLESTEROL, DIRECT: Direct LDL: 72 mg/dL

## 2023-05-05 LAB — TSH: TSH: 0.88 u[IU]/mL (ref 0.35–5.50)

## 2023-05-05 NOTE — Assessment & Plan Note (Signed)
She remains asymptomatic .  Reviewed findings of prior CT scan today..  Patient is  Tolerating tolerating atorvastatin  20 mg  daily for primary prevention   Lab Results  Component Value Date   CHOL 159 05/04/2023   HDL 73.40 05/04/2023   LDLCALC 70 05/04/2023   LDLDIRECT 72.0 05/04/2023   TRIG 75.0 05/04/2023   CHOLHDL 2 05/04/2023

## 2023-05-05 NOTE — Assessment & Plan Note (Signed)
secondary to metformin use. managed with injections .advised to resume weekly injections.   Lab Results  Component Value Date   VITAMINB12 134 (L) 05/04/2023

## 2023-05-05 NOTE — Assessment & Plan Note (Signed)
Managed with maximal doses of ozempic .

## 2023-05-05 NOTE — Assessment & Plan Note (Addendum)
Irona nd b12 are low.  Hgb is borderline referring to GI Lab Results  Component Value Date   WBC 5.5 05/04/2023   HGB 12.1 05/04/2023   HCT 36.5 05/04/2023   MCV 93.9 05/04/2023   PLT 211.0 05/04/2023    Lab Results  Component Value Date   IRON 101 05/04/2023   TIBC 427.0 05/04/2023   FERRITIN 13.2 05/04/2023    Lab Results  Component Value Date   VITAMINB12 134 (L) 05/04/2023

## 2023-05-05 NOTE — Assessment & Plan Note (Signed)
Her blood pressure remains well controlled on current regimen. Renal function stable, no changes today.   Lab Results  Component Value Date   CREATININE 0.79 05/04/2023   Lab Results  Component Value Date   NA 135 05/04/2023   K 4.3 05/04/2023   CL 98 05/04/2023   CO2 30 05/04/2023

## 2023-05-05 NOTE — Assessment & Plan Note (Signed)
Currently well-controlled on current medications .  hemoglobin A1c is at goal of less than 7.0 . Patient is reminded to schedule an annual eye exam and foot exam is normal today. Patient has no microalbuminuria. Patient is tolerating statin therapy for CAD risk reduction and on ACE/ARB for renal protection and hypertension    Lab Results  Component Value Date   CHOL 159 05/04/2023   HDL 73.40 05/04/2023   LDLCALC 70 05/04/2023   LDLDIRECT 72.0 05/04/2023   TRIG 75.0 05/04/2023   CHOLHDL 2 05/04/2023   Lab Results  Component Value Date   HGBA1C 5.1 05/04/2023

## 2023-05-07 MED ORDER — "SYRINGE 25G X 1"" 3 ML MISC": 0 refills | Status: DC

## 2023-05-07 MED ORDER — FOLIC ACID 1 MG PO TABS
1.0000 mg | ORAL_TABLET | Freq: Every day | ORAL | 3 refills | Status: DC
Start: 2023-05-07 — End: 2024-01-25

## 2023-05-07 MED ORDER — CYANOCOBALAMIN 1000 MCG/ML IJ SOLN
1000.0000 ug | INTRAMUSCULAR | 11 refills | Status: DC
Start: 2023-05-07 — End: 2023-12-07

## 2023-05-07 NOTE — Addendum Note (Signed)
Addended by: Sherlene Shams on: 05/07/2023 02:10 PM   Modules accepted: Orders

## 2023-05-08 ENCOUNTER — Other Ambulatory Visit: Payer: Self-pay

## 2023-05-08 ENCOUNTER — Telehealth: Payer: Self-pay | Admitting: Pharmacy Technician

## 2023-05-08 ENCOUNTER — Telehealth: Payer: Self-pay

## 2023-05-08 DIAGNOSIS — Z86010 Personal history of colonic polyps: Secondary | ICD-10-CM

## 2023-05-08 MED ORDER — NA SULFATE-K SULFATE-MG SULF 17.5-3.13-1.6 GM/177ML PO SOLN: 354.0000 mL | Freq: Once | ORAL | 0 refills | Status: AC

## 2023-05-08 NOTE — Telephone Encounter (Signed)
Gastroenterology Pre-Procedure Review  Request Date: 06/01/2023 Requesting Physician: Dr. Servando Snare   PATIENT REVIEW QUESTIONS: The patient responded to the following health history questions as indicated:    1. Are you having any GI issues? Has chronic constipation and diarrhea and has saw Dr. Servando Snare for this in the past  2. Do you have a personal history of Polyps? Yes at last colonoscopy on 03/30/18 3. Do you have a family history of Colon Cancer or Polyps? no 4. Diabetes Mellitus? States she was but does not thinks she is now.  5. Joint replacements in the past 12 months?no 6. Major health problems in the past 3 months?no 7. Any artificial heart valves, MVP, or defibrillator?no    MEDICATIONS & ALLERGIES:    Patient reports the following regarding taking any anticoagulation/antiplatelet therapy:   Plavix, Coumadin, Eliquis, Xarelto, Lovenox, Pradaxa, Brilinta, or Effient? no Aspirin? no  Is taking Ozempic  Patient confirms/reports the following medications:  Current Outpatient Medications  Medication Sig Dispense Refill   albuterol (PROVENTIL) (2.5 MG/3ML) 0.083% nebulizer solution Take 3 mLs (2.5 mg total) by nebulization every 6 (six) hours as needed for wheezing or shortness of breath. 150 mL 1   albuterol (VENTOLIN HFA) 108 (90 Base) MCG/ACT inhaler Inhale 2 puffs into the lungs every 6 (six) hours as needed for wheezing or shortness of breath. 3 each 1   amLODipine (NORVASC) 10 MG tablet TAKE 1 TABLET BY MOUTH DAILY 90 tablet 3   atorvastatin (LIPITOR) 20 MG tablet Take 1 tablet (20 mg total) by mouth daily. 90 tablet 3   Continuous Blood Gluc Sensor (FREESTYLE LIBRE 14 DAY SENSOR) MISC For diabetes management 6 each 3   Continuous Glucose Sensor (FREESTYLE LIBRE 2 SENSOR) MISC For diabetes management 6 each 3   cyanocobalamin (VITAMIN B12) 1000 MCG/ML injection Inject 1 mL (1,000 mcg total) into the muscle once a week. For 4 weeks,  then monthly thereafter 1 mL 11   cyclobenzaprine  (FLEXERIL) 10 MG tablet TAKE ONE TABLET (10 MG) BY MOUTH 3 TIMESDAILY AS NEEDED FOR MUSCLE SPASMS 30 tablet 5   folic acid (FOLVITE) 1 MG tablet Take 1 tablet (1 mg total) by mouth daily. 90 tablet 3   furosemide (LASIX) 20 MG tablet TAKE ONE TABLET EVERY DAY AS NEEDED FOR FLUID RETENTION 90 tablet 1   gabapentin (NEURONTIN) 300 MG capsule 2 capsules in the am,  3 in the afternoon and 3 in the evening 720 capsule 1   losartan (COZAAR) 100 MG tablet Take 1 tablet (100 mg total) by mouth daily. 90 tablet 1   montelukast (SINGULAIR) 10 MG tablet TAKE 1 TABLET BY MOUTH NIGHTLY 90 tablet 3   omeprazole (PRILOSEC) 20 MG capsule TAKE 1 CAPSULE BY MOUTH TWICE DAILY 180 capsule 3   PARoxetine (PAXIL) 20 MG tablet TAKE 1 TABLET BY MOUTH EVERY MORNING 90 tablet 3   promethazine (PHENERGAN) 25 MG tablet TAKE 1 TABLET BY MOUTH EVERY 8 HOURS AS NEEDED FOR NAUSEA AND VOMITING 90 tablet 0   Semaglutide, 2 MG/DOSE, (OZEMPIC, 2 MG/DOSE,) 8 MG/3ML SOPN Inject 2 mg into the skin once a week.     Syringe/Needle, Disp, (SYRINGE 3CC/25GX1") 25G X 1" 3 ML MISC Use for b12 injections 50 each 0   triamterene-hydrochlorothiazide (MAXZIDE-25) 37.5-25 MG tablet TAKE ONE TABLET BY MOUTH DAILY 90 tablet 1   zolpidem (AMBIEN) 10 MG tablet TAKE ONE TABLET (10 MG) BY MOUTH AT BEDTIME AS NEEDED FOR SLEEP 30 tablet 2   Current Facility-Administered  Medications  Medication Dose Route Frequency Provider Last Rate Last Admin   pneumococcal 13-valent conjugate vaccine (PREVNAR 13) injection 0.5 mL  0.5 mL Intramuscular Once Sherlene Shams, MD        Patient confirms/reports the following allergies:  No Known Allergies  No orders of the defined types were placed in this encounter.   AUTHORIZATION INFORMATION Primary Insurance: 1D#: Group #:  Secondary Insurance: 1D#: Group #:  SCHEDULE INFORMATION: Date:  Time: Location:

## 2023-05-08 NOTE — Telephone Encounter (Signed)
Patient Advocate Encounter   Received notification that prior authorization for FreeStyle Libre 2 Sensor is required.   PA submitted on 05/08/2023 Key [id number] Insurance [employer] Medicare Part D Electronic Prior Authorization Form Status is pending

## 2023-05-11 ENCOUNTER — Encounter: Payer: Self-pay | Admitting: Internal Medicine

## 2023-05-11 ENCOUNTER — Ambulatory Visit: Payer: Medicare Other | Admitting: Internal Medicine

## 2023-05-11 VITALS — BP 130/70 | HR 66 | Temp 97.8°F | Resp 16 | Ht 67.0 in | Wt 233.0 lb

## 2023-05-11 DIAGNOSIS — R0602 Shortness of breath: Secondary | ICD-10-CM | POA: Diagnosis not present

## 2023-05-11 DIAGNOSIS — J4520 Mild intermittent asthma, uncomplicated: Secondary | ICD-10-CM

## 2023-05-11 NOTE — Progress Notes (Signed)
[facility] [address] [place], [place] [address]  Pulmonary Sleep Medicine   Office Visit Note  Patient Name: Katie Huber DOB: [birth year] MRN [id number]  Date of Service: 05/11/2023  Complaints/HPI: She had a home study done and shows presence of moderate OSA. She is not using her CPAP does not want to really use it. She states she feels very good and has felt the best she has felt. Still has been able to maintain her weight. Has had some low Hgb which she is working on with her PCP  Office Spirometry Results:     ROS  General: (-) fever, (-) chills, (-) night sweats, (-) weakness Skin: (-) rashes, (-) itching,. Eyes: (-) visual changes, (-) redness, (-) itching. Nose and Sinuses: (-) nasal stuffiness or itchiness, (-) postnasal drip, (-) nosebleeds, (-) sinus trouble. Mouth and Throat: (-) sore throat, (-) hoarseness. Neck: (-) swollen glands, (-) enlarged thyroid, (-) neck pain. Respiratory: - cough, (-) bloody sputum, - shortness of breath, - wheezing. Cardiovascular: - ankle swelling, (-) chest pain. Lymphatic: (-) lymph node enlargement. Neurologic: (-) numbness, (-) tingling. Psychiatric: (-) anxiety, (-) depression   Current Medication: Outpatient Encounter Medications as of 05/11/2023  Medication Sig   albuterol (PROVENTIL) (2.5 MG/3ML) 0.083% nebulizer solution Take 3 mLs (2.5 mg total) by nebulization every 6 (six) hours as needed for wheezing or shortness of breath.   albuterol (VENTOLIN HFA) 108 (90 Base) MCG/ACT inhaler Inhale 2 puffs into the lungs every 6 (six) hours as needed for wheezing or shortness of breath.   amLODipine (NORVASC) 10 MG tablet TAKE 1 TABLET BY MOUTH DAILY   atorvastatin (LIPITOR) 20 MG tablet Take 1 tablet (20 mg total) by mouth daily.   Continuous Blood Gluc Sensor (FREESTYLE LIBRE 14 DAY SENSOR) MISC For diabetes management   Continuous Glucose Sensor (FREESTYLE LIBRE 2 SENSOR) MISC For diabetes management    cyanocobalamin (VITAMIN B12) 1000 MCG/ML injection Inject 1 mL (1,000 mcg total) into the muscle once a week. For 4 weeks,  then monthly thereafter   cyclobenzaprine (FLEXERIL) 10 MG tablet TAKE ONE TABLET (10 MG) BY MOUTH 3 TIMESDAILY AS NEEDED FOR MUSCLE SPASMS   folic acid (FOLVITE) 1 MG tablet Take 1 tablet (1 mg total) by mouth daily.   furosemide (LASIX) 20 MG tablet TAKE ONE TABLET EVERY DAY AS NEEDED FOR FLUID RETENTION   gabapentin (NEURONTIN) 300 MG capsule 2 capsules in the am,  3 in the afternoon and 3 in the evening   losartan (COZAAR) 100 MG tablet Take 1 tablet (100 mg total) by mouth daily.   montelukast (SINGULAIR) 10 MG tablet TAKE 1 TABLET BY MOUTH NIGHTLY   omeprazole (PRILOSEC) 20 MG capsule TAKE 1 CAPSULE BY MOUTH TWICE DAILY   PARoxetine (PAXIL) 20 MG tablet TAKE 1 TABLET BY MOUTH EVERY MORNING   promethazine (PHENERGAN) 25 MG tablet TAKE 1 TABLET BY MOUTH EVERY 8 HOURS AS NEEDED FOR NAUSEA AND VOMITING   Semaglutide, 2 MG/DOSE, (OZEMPIC, 2 MG/DOSE,) 8 MG/3ML SOPN Inject 2 mg into the skin once a week.   Syringe/Needle, Disp, (SYRINGE 3CC/25GX1") 25G X 1" 3 ML MISC Use for b12 injections   triamterene-hydrochlorothiazide (MAXZIDE-25) 37.5-25 MG tablet TAKE ONE TABLET BY MOUTH DAILY   zolpidem (AMBIEN) 10 MG tablet TAKE ONE TABLET (10 MG) BY MOUTH AT BEDTIME AS NEEDED FOR SLEEP   Facility-Administered Encounter Medications as of 05/11/2023  Medication   pneumococcal 13-valent conjugate vaccine (PREVNAR 13) injection 0.5 mL    Surgical  History: Past Surgical History:  Procedure Laterality Date   ABDOMINAL HYSTERECTOMY  2001   BREAST BIOPSY Right    Neg   BREAST EXCISIONAL BIOPSY Right 1990s   multiple hematomas   CHOLECYSTECTOMY  1970s   COLONOSCOPY WITH PROPOFOL N/A 03/30/2018   Procedure: COLONOSCOPY WITH PROPOFOL;  Surgeon: Midge Minium, MD;  Location: [facility] ENDOSCOPY;  Service: Endoscopy;  Laterality: N/A;   JOINT REPLACEMENT     Bilateral   STOMACH SURGERY   [phone]   gastric partitionin( for obesity)     Medical History: Past Medical History:  Diagnosis Date   Asthma 1990   Chronic respiratory failure with hypoxia (HCC) 12/11/2017   Hypertension    Obesity, Class III, BMI 40-49.9 (morbid obesity) (HCC)    weight fluctuations of 100 lbs     Family History: Family History  Problem Relation Age of Onset   Heart disease Father    COPD Father    Hyperlipidemia Father    Mental illness Brother    Breast cancer Neg Hx     Social History: Social History   Socioeconomic History   Marital status: Single    Spouse name: Not on file   Number of children: Not on file   Years of education: Not on file   Highest education level: Bachelor's degree (e.g., BA, AB, BS)  Occupational History   Not on file  Tobacco Use   Smoking status: Never   Smokeless tobacco: Never  Vaping Use   Vaping Use: Never used  Substance and Sexual Activity   Alcohol use: Yes    Comment: ocassionally   Drug use: No   Sexual activity: Never  Other Topics Concern   Not on file  Social History Narrative   Not on file   Social Determinants of Health   Financial Resource Strain: Low Risk  (05/04/2023)   Overall Financial Resource Strain (CARDIA)    Difficulty of Paying Living Expenses: Not hard at all  Food Insecurity: No Food Insecurity (05/04/2023)   Hunger Vital Sign    Worried About Running Out of Food in the Last Year: Never true    Ran Out of Food in the Last Year: Never true  Transportation Needs: No Transportation Needs (05/04/2023)   PRAPARE - Administrator, Civil Service (Medical): No    Lack of Transportation (Non-Medical): No  Physical Activity: Insufficiently Active (05/04/2023)   Exercise Vital Sign    Days of Exercise per Week: 3 days    Minutes of Exercise per Session: 40 min  Stress: No Stress Concern Present (05/04/2023)   [employer] of Occupational Health - Occupational Stress Questionnaire    Feeling of Stress : Only a  little  Social Connections: Moderately Integrated (05/04/2023)   Social Connection and Isolation Panel [NHANES]    Frequency of Communication with Friends and Family: More than three times a week    Frequency of Social Gatherings with Friends and Family: Twice a week    Attends Religious Services: More than 4 times per year    Active Member of [employer] or Organizations: Yes    Attends Banker Meetings: More than 4 times per year    Marital Status: Divorced  Intimate Partner Violence: Not At Risk (03/06/2023)   Humiliation, Afraid, Rape, and Kick questionnaire    Fear of Current or Ex-Partner: No    Emotionally Abused: No    Physically Abused: No    Sexually Abused: No    Vital Signs: Blood  pressure 130/70, pulse 66, temperature 97.8 F (36.6 C), resp. rate 16, height 5\' 7"  (1.702 m), weight 233 lb (105.7 kg), SpO2 97 %.  Examination: General Appearance: The patient is well-developed, well-nourished, and in no distress. Skin: Gross inspection of skin unremarkable. Head: normocephalic, no gross deformities. Eyes: no gross deformities noted. ENT: ears appear grossly normal no exudates. Neck: Supple. No thyromegaly. No LAD. Respiratory: No rhonchi are noted at this time. Cardiovascular: Normal S1 and S2 without murmur or rub. Extremities: No cyanosis. pulses are equal. Neurologic: Alert and oriented. No involuntary movements.  LABS: Recent Results (from the past 2160 hour(s))  HgB A1c     Status: None   Collection Time: 05/04/23  2:32 PM  Result Value Ref Range   Hgb A1c MFr Bld 5.1 4.6 - 6.5 %    Comment: Glycemic Control Guidelines for People with Diabetes:Non Diabetic:  <6%Goal of Therapy: <7%Additional Action Suggested:  >8%   Comp Met (CMET)     Status: None   Collection Time: 05/04/23  2:32 PM  Result Value Ref Range   Sodium 135 135 - 145 mEq/L   Potassium 4.3 3.5 - 5.1 mEq/L   Chloride 98 96 - 112 mEq/L   CO2 30 19 - 32 mEq/L   Glucose, Bld 91 70 - 99 mg/dL    BUN 17 6 - 23 mg/dL   Creatinine, Ser 4.09 0.40 - 1.20 mg/dL   Total Bilirubin 0.5 0.2 - 1.2 mg/dL   Alkaline Phosphatase 73 39 - 117 U/L   AST 19 0 - 37 U/L   ALT 13 0 - 35 U/L   Total Protein 6.7 6.0 - 8.3 g/dL   Albumin 4.1 3.5 - 5.2 g/dL   GFR 81.19 >14.78 mL/min    Comment: Calculated using the CKD-EPI Creatinine Equation (2021)   Calcium 9.5 8.4 - 10.5 mg/dL  Lipid Profile     Status: None   Collection Time: 05/04/23  2:32 PM  Result Value Ref Range   Cholesterol 159 0 - 200 mg/dL    Comment: ATP III Classification       Desirable:  < 200 mg/dL               Borderline High:  200 - 239 mg/dL          High:  > = 295 mg/dL   Triglycerides 62.1 0.0 - 149.0 mg/dL    Comment: Normal:  <308 mg/dLBorderline High:  150 - 199 mg/dL   HDL 65.78 >46.96 mg/dL   VLDL 29.5 0.0 - 28.4 mg/dL   LDL Cholesterol 70 0 - 99 mg/dL   Total CHOL/HDL Ratio 2     Comment:                Men          Women1/2 Average Risk     3.4          3.3Average Risk          5.0          4.42X Average Risk          9.6          7.13X Average Risk          15.0          11.0                       NonHDL 85.20     Comment: NOTE:  Non-HDL goal  should be 30 mg/dL higher than patient's LDL goal (i.e. LDL goal of < 70 mg/dL, would have non-HDL goal of < 100 mg/dL)  Direct LDL     Status: None   Collection Time: 05/04/23  2:32 PM  Result Value Ref Range   Direct LDL 72.0 mg/dL    Comment: Optimal:  <161 mg/dLNear or Above Optimal:  100-129 mg/dLBorderline High:  130-159 mg/dLHigh:  160-189 mg/dLVery High:  >190 mg/dL  TSH     Status: None   Collection Time: 05/04/23  2:32 PM  Result Value Ref Range   TSH 0.88 0.35 - 5.50 uIU/mL  CBC with Differential/Platelet     Status: None   Collection Time: 05/04/23  2:32 PM  Result Value Ref Range   WBC 5.5 4.0 - 10.5 K/uL   RBC 3.88 3.87 - 5.11 Mil/uL   Hemoglobin 12.1 12.0 - 15.0 g/dL   HCT 09.6 04.5 - 40.9 %   MCV 93.9 78.0 - 100.0 fl   MCHC 33.2 30.0 - 36.0 g/dL   RDW  81.1 91.4 - 78.2 %   Platelets 211.0 150.0 - 400.0 K/uL   Neutrophils Relative % 43.5 43.0 - 77.0 %   Lymphocytes Relative 44.4 12.0 - 46.0 %   Monocytes Relative 9.2 3.0 - 12.0 %   Eosinophils Relative 1.9 0.0 - 5.0 %   Basophils Relative 1.0 0.0 - 3.0 %   Neutro Abs 2.4 1.4 - 7.7 K/uL   Lymphs Abs 2.4 0.7 - 4.0 K/uL   Monocytes Absolute 0.5 0.1 - 1.0 K/uL   Eosinophils Absolute 0.1 0.0 - 0.7 K/uL   Basophils Absolute 0.1 0.0 - 0.1 K/uL  IBC + Ferritin     Status: None   Collection Time: 05/04/23  2:32 PM  Result Value Ref Range   Iron 101 42 - 145 ug/dL   Transferrin 956.2 130.8 - 360.0 mg/dL   Saturation Ratios 65.7 20.0 - 50.0 %   Ferritin 13.2 10.0 - 291.0 ng/mL   TIBC 427.0 250.0 - 450.0 mcg/dL  Q46 and Folate Panel     Status: Abnormal   Collection Time: 05/04/23  2:32 PM  Result Value Ref Range   Vitamin B-12 134 (L) 211 - 911 pg/mL   Folate 5.2 (L) >5.9 ng/mL    Radiology: DG Sacrum/Coccyx  Result Date: 08/22/2022 CLINICAL DATA:  Pain over the coccyx. EXAM: SACRUM AND COCCYX - 2+ VIEW COMPARISON:  None Available. FINDINGS: There is no acute fracture or dislocation. The soft tissues are unremarkable. IMPRESSION: Negative. Electronically Signed   By: Elgie Collard M.D.   On: 08/22/2022 17:33    No results found.  No results found.  Assessment and Plan: Patient Active Problem List   Diagnosis Date Noted   Long-term current use of injectable noninsulin antidiabetic medication 05/05/2023   Anemia 05/05/2023   Body aches 08/22/2022   Hepatic steatosis 04/20/2022   Insomnia 04/20/2022   Major depressive disorder, recurrent episode with anxious distress (HCC) 06/11/2021   Onychomycosis of right great toe 03/04/2021   Coronary artery calcification of native artery 11/10/2020   Abdominal aortic atherosclerosis (HCC) 11/10/2020   Oral cavity pain 01/09/2020   Vitamin D deficiency 01/09/2020   Diarrhea, functional 07/26/2018   Tubular adenoma of colon 04/01/2018    Benign neoplasm of cecum    Encounter for screening colonoscopy    Rectal polyp    B12 deficiency 01/26/2018   Hospital discharge follow-up 03/14/2017   Mild intermittent asthma 03/04/2017   Herpes simplex  infection 08/16/2015   Skin neoplasm 08/16/2015   Pulmonary hypertension (HCC) 02/06/2015   S/P vaginal hysterectomy 06/13/2014   Encounter for preventive health examination 06/13/2014   Essential hypertension, benign 12/27/2013   S/P bariatric surgery 09/20/2013   Insomnia due to anxiety and fear 06/14/2013   Type 2 diabetes mellitus with obesity (HCC) 03/02/2013   Hyperlipidemia associated with type 2 diabetes mellitus (HCC) 12/17/2011   Encounter for long-term (current) use of other medications 12/17/2011   Cervicalgia 12/16/2011   Sleep apnea 12/16/2011    1. SOB (shortness of breath) She is doing well at baseline has had some improvement in her overall respiratory status with weight loss.  She is doing well and has been able to maintain her weight at the current level  2. Mild intermittent asthma without complication Under good control plan is going to continue with current regimen as needed.  She states that she has not had any admissions secondary to any pulmonary issues  General Counseling: I have discussed the findings of the evaluation and examination with Ashaki.  I have also discussed any further diagnostic evaluation thatmay be needed or ordered today. Kathreen verbalizes understanding of the findings of todays visit. We also reviewed her medications today and discussed drug interactions and side effects including but not limited excessive drowsiness and altered mental states. We also discussed that there is always a risk not just to her but also people around her. she has been encouraged to call the office with any questions or concerns that should arise related to todays visit.  No orders of the defined types were placed in this encounter.    Time spent: 68  I have  personally obtained a history, examined the patient, evaluated laboratory and imaging results, formulated the assessment and plan and placed orders.    Yevonne Pax, MD [facility] Pulmonary and Critical Care Sleep medicine

## 2023-05-13 ENCOUNTER — Telehealth: Payer: Self-pay

## 2023-05-13 NOTE — Telephone Encounter (Signed)
Patient called and states that her daughter can not bring her on 06/01/2023 and she lives closer to [place] so she needs to reschedule her colonoscopy. Rescheduled colonoscopy to 07/14/2023 with Dr. Servando Snare. Mailed out new instructions. Called kim in [place] for her to change patient and put patient in the [facility] depo

## 2023-05-13 NOTE — Telephone Encounter (Signed)
Informed trish of this information and she will get patient scheduled

## 2023-05-14 ENCOUNTER — Other Ambulatory Visit (INDEPENDENT_AMBULATORY_CARE_PROVIDER_SITE_OTHER): Payer: Medicare Other

## 2023-05-14 DIAGNOSIS — D649 Anemia, unspecified: Secondary | ICD-10-CM | POA: Diagnosis not present

## 2023-05-14 LAB — FECAL OCCULT BLOOD, IMMUNOCHEMICAL: Fecal Occult Bld: NEGATIVE

## 2023-05-15 ENCOUNTER — Telehealth: Payer: Self-pay

## 2023-05-15 NOTE — Telephone Encounter (Signed)
LMTCB in regards to lab results.  

## 2023-05-15 NOTE — Telephone Encounter (Signed)
-----   Message from Eulis Foster, FNP sent at 05/14/2023 10:10 PM EDT ----- Hemoccult is negative/normal

## 2023-05-18 NOTE — Telephone Encounter (Signed)
noted 

## 2023-05-18 NOTE — Telephone Encounter (Signed)
Pt called back and I read the message to her and she understood 

## 2023-05-26 NOTE — Telephone Encounter (Signed)
Pharmacy Patient Advocate Encounter  Received notification from [facility] that Prior Authorization for Katie Huber has been DENIED because  .[name]  PA #/Case ID/Reference #: [id number] Please be advised we currently do not have a Pharmacist to review denials, therefore you will need to process appeals accordingly as needed. Thanks for your support at this time. Contact for appeals are as follows: Phone:  , Fax:

## 2023-05-28 ENCOUNTER — Telehealth: Payer: Self-pay | Admitting: Internal Medicine

## 2023-05-28 NOTE — Telephone Encounter (Signed)
LMTCB. Need to let pt know that the freestyle Josephine Igo has been denied by her insurance.

## 2023-05-28 NOTE — Telephone Encounter (Signed)
Pt called stating the cma called her and she did not know what it was about

## 2023-05-29 NOTE — Telephone Encounter (Signed)
See previous message

## 2023-06-02 ENCOUNTER — Other Ambulatory Visit: Payer: Self-pay | Admitting: Internal Medicine

## 2023-07-01 ENCOUNTER — Ambulatory Visit
Admission: RE | Admit: 2023-07-01 | Discharge: 2023-07-01 | Disposition: A | Discharge status: Home / Self Care | Payer: Medicare Other | Source: Ambulatory Visit | Attending: Internal Medicine | Admitting: Internal Medicine

## 2023-07-01 DIAGNOSIS — M8588 Other specified disorders of bone density and structure, other site: Secondary | ICD-10-CM | POA: Insufficient documentation

## 2023-07-01 DIAGNOSIS — Z1231 Encounter for screening mammogram for malignant neoplasm of breast: Secondary | ICD-10-CM | POA: Insufficient documentation

## 2023-07-01 DIAGNOSIS — I272 Pulmonary hypertension, unspecified: Secondary | ICD-10-CM | POA: Insufficient documentation

## 2023-07-01 DIAGNOSIS — Z8781 Personal history of (healed) traumatic fracture: Secondary | ICD-10-CM | POA: Diagnosis not present

## 2023-07-01 DIAGNOSIS — K640 First degree hemorrhoids: Secondary | ICD-10-CM | POA: Insufficient documentation

## 2023-07-01 DIAGNOSIS — I10 Essential (primary) hypertension: Secondary | ICD-10-CM | POA: Diagnosis not present

## 2023-07-01 DIAGNOSIS — G4730 Sleep apnea, unspecified: Secondary | ICD-10-CM | POA: Insufficient documentation

## 2023-07-01 DIAGNOSIS — K635 Polyp of colon: Secondary | ICD-10-CM | POA: Insufficient documentation

## 2023-07-01 DIAGNOSIS — M85851 Other specified disorders of bone density and structure, right thigh: Secondary | ICD-10-CM | POA: Diagnosis not present

## 2023-07-01 DIAGNOSIS — E119 Type 2 diabetes mellitus without complications: Secondary | ICD-10-CM | POA: Diagnosis not present

## 2023-07-01 DIAGNOSIS — D122 Benign neoplasm of ascending colon: Secondary | ICD-10-CM | POA: Diagnosis not present

## 2023-07-01 DIAGNOSIS — D124 Benign neoplasm of descending colon: Secondary | ICD-10-CM | POA: Diagnosis not present

## 2023-07-01 DIAGNOSIS — I2510 Atherosclerotic heart disease of native coronary artery without angina pectoris: Secondary | ICD-10-CM | POA: Insufficient documentation

## 2023-07-01 DIAGNOSIS — Z1211 Encounter for screening for malignant neoplasm of colon: Secondary | ICD-10-CM | POA: Insufficient documentation

## 2023-07-01 DIAGNOSIS — Z86010 Personal history of colonic polyps: Secondary | ICD-10-CM | POA: Diagnosis present

## 2023-07-01 DIAGNOSIS — J45909 Unspecified asthma, uncomplicated: Secondary | ICD-10-CM | POA: Diagnosis not present

## 2023-07-13 ENCOUNTER — Encounter: Payer: Self-pay | Admitting: Gastroenterology

## 2023-07-14 ENCOUNTER — Encounter: Payer: Self-pay | Admitting: Gastroenterology

## 2023-07-14 ENCOUNTER — Ambulatory Visit: Payer: Medicare Other | Admitting: Registered Nurse

## 2023-07-14 ENCOUNTER — Encounter
Admission: RE | Disposition: A | Discharge status: Home / Self Care | Payer: Self-pay | Source: Home / Self Care | Attending: Gastroenterology

## 2023-07-14 ENCOUNTER — Ambulatory Visit
Admission: RE | Admit: 2023-07-14 | Discharge: 2023-07-14 | Disposition: A | Discharge status: Home / Self Care | Payer: Medicare Other | Attending: Gastroenterology | Admitting: Gastroenterology

## 2023-07-14 ENCOUNTER — Other Ambulatory Visit: Payer: Self-pay

## 2023-07-14 DIAGNOSIS — K635 Polyp of colon: Secondary | ICD-10-CM | POA: Diagnosis not present

## 2023-07-14 DIAGNOSIS — I2510 Atherosclerotic heart disease of native coronary artery without angina pectoris: Secondary | ICD-10-CM | POA: Diagnosis not present

## 2023-07-14 DIAGNOSIS — I10 Essential (primary) hypertension: Secondary | ICD-10-CM | POA: Diagnosis not present

## 2023-07-14 DIAGNOSIS — E119 Type 2 diabetes mellitus without complications: Secondary | ICD-10-CM | POA: Diagnosis not present

## 2023-07-14 DIAGNOSIS — Z1211 Encounter for screening for malignant neoplasm of colon: Secondary | ICD-10-CM | POA: Diagnosis not present

## 2023-07-14 DIAGNOSIS — D122 Benign neoplasm of ascending colon: Secondary | ICD-10-CM | POA: Diagnosis not present

## 2023-07-14 DIAGNOSIS — I272 Pulmonary hypertension, unspecified: Secondary | ICD-10-CM | POA: Diagnosis not present

## 2023-07-14 DIAGNOSIS — M8588 Other specified disorders of bone density and structure, other site: Secondary | ICD-10-CM | POA: Diagnosis not present

## 2023-07-14 DIAGNOSIS — K640 First degree hemorrhoids: Secondary | ICD-10-CM | POA: Diagnosis not present

## 2023-07-14 DIAGNOSIS — Z1231 Encounter for screening mammogram for malignant neoplasm of breast: Secondary | ICD-10-CM | POA: Diagnosis not present

## 2023-07-14 DIAGNOSIS — Z86010 Personal history of colonic polyps: Secondary | ICD-10-CM

## 2023-07-14 DIAGNOSIS — G4730 Sleep apnea, unspecified: Secondary | ICD-10-CM | POA: Diagnosis not present

## 2023-07-14 DIAGNOSIS — J45909 Unspecified asthma, uncomplicated: Secondary | ICD-10-CM | POA: Diagnosis not present

## 2023-07-14 DIAGNOSIS — Z8781 Personal history of (healed) traumatic fracture: Secondary | ICD-10-CM | POA: Diagnosis not present

## 2023-07-14 DIAGNOSIS — D124 Benign neoplasm of descending colon: Secondary | ICD-10-CM | POA: Diagnosis not present

## 2023-07-14 HISTORY — PX: POLYPECTOMY: SHX5525

## 2023-07-14 HISTORY — PX: HOT HEMOSTASIS: SHX5433

## 2023-07-14 HISTORY — PX: COLONOSCOPY WITH PROPOFOL: SHX5780

## 2023-07-14 SURGERY — COLONOSCOPY WITH PROPOFOL
Anesthesia: General

## 2023-07-14 MED ORDER — PROPOFOL 10 MG/ML IV BOLUS
INTRAVENOUS | Status: DC | PRN
Start: 2023-07-14 — End: 2023-07-14
  Administered 2023-07-14 (×2): 50 mg via INTRAVENOUS

## 2023-07-14 MED ORDER — PROPOFOL 1000 MG/100ML IV EMUL
INTRAVENOUS | Status: AC
  Filled 2023-07-14: qty 100

## 2023-07-14 MED ORDER — LIDOCAINE HCL (CARDIAC) PF 100 MG/5ML IV SOSY
PREFILLED_SYRINGE | INTRAVENOUS | Status: DC | PRN
  Administered 2023-07-14: 40 mg via INTRAVENOUS

## 2023-07-14 MED ORDER — PROPOFOL 500 MG/50ML IV EMUL
INTRAVENOUS | Status: DC | PRN
  Administered 2023-07-14: 140 ug/kg/min via INTRAVENOUS

## 2023-07-14 MED ORDER — SODIUM CHLORIDE 0.9 % IV SOLN: INTRAVENOUS | Status: DC

## 2023-07-14 NOTE — Transfer of Care (Signed)
Immediate Anesthesia Transfer of Care Note  Patient: Katie Huber  Procedure(s) Performed: COLONOSCOPY WITH PROPOFOL POLYPECTOMY  Patient Location: Endoscopy Unit  Anesthesia Type:MAC  Level of Consciousness: awake and drowsy  Airway & Oxygen Therapy: Patient Spontanous Breathing  Post-op Assessment: Report given to RN and Post -op Vital signs reviewed and stable  Post vital signs: Reviewed and stable  Last Vitals:  Vitals Value Taken Time  BP 113/54 07/14/23 1003  Temp    Pulse 59 07/14/23 1004  Resp 15 07/14/23 1004  SpO2 100 % 07/14/23 1004  Vitals shown include unfiled device data.  Last Pain:  Vitals:   07/14/23 0804  TempSrc: Temporal  PainSc: 0-No pain         Complications: No notable events documented.

## 2023-07-14 NOTE — H&P (Signed)
Midge Minium, MD [facility] [address], [address] [place], [place] [address] Phone:[phone] Fax : [phone]  Primary Care Physician:  Sherlene Shams, MD Primary Gastroenterologist:  Dr. Servando Snare  Pre-Procedure History & Physical: HPI:  Katie Huber is a 72 y.o. female is here for an colonoscopy.   Past Medical History:  Diagnosis Date   Asthma 1990   Chronic respiratory failure with hypoxia (HCC) 12/11/2017   Hypertension    Obesity, Class III, BMI 40-49.9 (morbid obesity) (HCC)    weight fluctuations of 100 lbs     Past Surgical History:  Procedure Laterality Date   ABDOMINAL HYSTERECTOMY  2001   BREAST BIOPSY Right    Neg   BREAST EXCISIONAL BIOPSY Right 1990s   multiple hematomas   CHOLECYSTECTOMY  1970s   COLONOSCOPY WITH PROPOFOL N/A 03/30/2018   Procedure: COLONOSCOPY WITH PROPOFOL;  Surgeon: Midge Minium, MD;  Location: [facility] ENDOSCOPY;  Service: Endoscopy;  Laterality: N/A;   JOINT REPLACEMENT     Bilateral   STOMACH SURGERY  [phone]   gastric partitionin( for obesity)     Prior to Admission medications   Medication Sig Start Date End Date Taking? Authorizing Provider  amLODipine (NORVASC) 10 MG tablet TAKE 1 TABLET BY MOUTH DAILY 12/10/22  Yes Sherlene Shams, MD  atorvastatin (LIPITOR) 20 MG tablet Take 1 tablet (20 mg total) by mouth daily. 05/04/23  Yes Sherlene Shams, MD  carvedilol (COREG) 3.125 MG tablet Take 3.125 mg by mouth 2 (two) times daily with a meal.   Yes [provider]  cyclobenzaprine (FLEXERIL) 10 MG tablet TAKE ONE TABLET (10 MG) BY MOUTH 3 TIMESDAILY AS NEEDED FOR MUSCLE SPASMS 10/04/22  Yes Sherlene Shams, MD  folic acid (FOLVITE) 1 MG tablet Take 1 tablet (1 mg total) by mouth daily. 05/07/23  Yes Sherlene Shams, MD  losartan (COZAAR) 100 MG tablet Take 1 tablet (100 mg total) by mouth daily. 05/04/23  Yes Sherlene Shams, MD  montelukast (SINGULAIR) 10 MG tablet TAKE 1 TABLET BY MOUTH NIGHTLY 12/15/22  Yes Sherlene Shams, MD   omeprazole (PRILOSEC) 20 MG capsule TAKE 1 CAPSULE BY MOUTH TWICE DAILY 12/08/22  Yes Sherlene Shams, MD  PARoxetine (PAXIL) 20 MG tablet TAKE 1 TABLET BY MOUTH EVERY MORNING 01/20/23  Yes Sherlene Shams, MD  triamterene-hydrochlorothiazide (MAXZIDE-25) 37.5-25 MG tablet TAKE ONE TABLET BY MOUTH DAILY 04/29/23  Yes Sherlene Shams, MD  zolpidem (AMBIEN) 10 MG tablet TAKE ONE TABLET (10 MG) BY MOUTH AT BEDTIME AS NEEDED FOR SLEEP 04/16/23  Yes Sherlene Shams, MD  albuterol (PROVENTIL) (2.5 MG/3ML) 0.083% nebulizer solution Take 3 mLs (2.5 mg total) by nebulization every 6 (six) hours as needed for wheezing or shortness of breath. 01/19/19   [name], NP  albuterol (VENTOLIN HFA) 108 (90 Base) MCG/ACT inhaler Inhale 2 puffs into the lungs every 6 (six) hours as needed for wheezing or shortness of breath. 03/10/22   Sherlene Shams, MD  Continuous Blood Gluc Sensor (FREESTYLE LIBRE 14 DAY SENSOR) MISC For diabetes management 04/18/22   Sherlene Shams, MD  Continuous Glucose Sensor (FREESTYLE LIBRE 2 SENSOR) MISC For diabetes management 05/04/23   Sherlene Shams, MD  cyanocobalamin (VITAMIN B12) 1000 MCG/ML injection Inject 1 mL (1,000 mcg total) into the muscle once a week. For 4 weeks,  then monthly thereafter 05/07/23   Sherlene Shams, MD  furosemide (LASIX) 20 MG tablet TAKE ONE TABLET EVERY DAY AS NEEDED  FOR FLUID RETENTION Patient not taking: Reported on 07/14/2023 04/30/23   Sherlene Shams, MD  gabapentin (NEURONTIN) 300 MG capsule 2 capsules in the am,  3 in the afternoon and 3 in the evening 04/13/23   Sherlene Shams, MD  promethazine (PHENERGAN) 25 MG tablet TAKE 1 TABLET BY MOUTH EVERY 8 HOURS AS NEEDED FOR NAUSEA AND VOMITING 06/02/23   Sherlene Shams, MD  Semaglutide, 2 MG/DOSE, (OZEMPIC, 2 MG/DOSE,) 8 MG/3ML SOPN Inject 2 mg into the skin once a week. Patient not taking: Reported on 07/14/2023    [provider]  Syringe/Needle, Disp, (SYRINGE 3CC/25GX1") 25G X 1" 3 ML MISC Use for  b12 injections 05/07/23   Sherlene Shams, MD    Allergies as of 05/08/2023   (No Known Allergies)    Family History  Problem Relation Age of Onset   Heart disease Father    COPD Father    Hyperlipidemia Father    Mental illness Brother    Breast cancer Neg Hx     Social History   Socioeconomic History   Marital status: Single    Spouse name: Not on file   Number of children: Not on file   Years of education: Not on file   Highest education level: Bachelor's degree (e.g., BA, AB, BS)  Occupational History   Not on file  Tobacco Use   Smoking status: Never   Smokeless tobacco: Never  Vaping Use   Vaping status: Never Used  Substance and Sexual Activity   Alcohol use: Yes    Comment: ocassionally   Drug use: No   Sexual activity: Never  Other Topics Concern   Not on file  Social History Narrative   Not on file   Social Determinants of Health   Financial Resource Strain: Low Risk  (05/04/2023)   Overall Financial Resource Strain (CARDIA)    Difficulty of Paying Living Expenses: Not hard at all  Food Insecurity: No Food Insecurity (05/04/2023)   Hunger Vital Sign    Worried About Running Out of Food in the Last Year: Never true    Ran Out of Food in the Last Year: Never true  Transportation Needs: No Transportation Needs (05/04/2023)   PRAPARE - Administrator, Civil Service (Medical): No    Lack of Transportation (Non-Medical): No  Physical Activity: Insufficiently Active (05/04/2023)   Exercise Vital Sign    Days of Exercise per Week: 3 days    Minutes of Exercise per Session: 40 min  Stress: No Stress Concern Present (05/04/2023)   [employer] of Occupational Health - Occupational Stress Questionnaire    Feeling of Stress : Only a little  Social Connections: Moderately Integrated (05/04/2023)   Social Connection and Isolation Panel [NHANES]    Frequency of Communication with Friends and Family: More than three times a week    Frequency of Social  Gatherings with Friends and Family: Twice a week    Attends Religious Services: More than 4 times per year    Active Member of [employer] or Organizations: Yes    Attends Engineer, structural: More than 4 times per year    Marital Status: Divorced  Intimate Partner Violence: Not At Risk (03/06/2023)   Humiliation, Afraid, Rape, and Kick questionnaire    Fear of Current or Ex-Partner: No    Emotionally Abused: No    Physically Abused: No    Sexually Abused: No    Review of Systems: See HPI, otherwise negative  ROS  Physical Exam: BP (!) 142/84   Pulse 68   Temp 97.7 F (36.5 C) (Temporal)   Resp 18   Wt 104.1 kg   SpO2 99%   BMI 35.96 kg/m  General:   Alert,  pleasant and cooperative in NAD Head:  Normocephalic and atraumatic. Neck:  Supple; no masses or thyromegaly. Lungs:  Clear throughout to auscultation.    Heart:  Regular rate and rhythm. Abdomen:  Soft, nontender and nondistended. Normal bowel sounds, without guarding, and without rebound.   Neurologic:  Alert and  oriented x4;  grossly normal neurologically.  Impression/Plan: Katie Huber is here for an colonoscopy to be performed for a history of adenomatous polyps on 2019   Risks, benefits, limitations, and alternatives regarding  colonoscopy have been reviewed with the patient.  Questions have been answered.  All parties agreeable.   Midge Minium, MD  07/14/2023, 8:37 AM

## 2023-07-14 NOTE — Op Note (Signed)
[facility] Patient Name: Katie Huber Procedure Date: 07/14/2023 9:19 AM MRN: [id number] Account #: [id number] Date of Birth: 21-Mar-1951 Admit Type: Outpatient Age: 72 Room: [facility] ENDO ROOM 4 Gender: Female Note Status: Finalized Instrument Name: Prentice Docker [id number] Procedure:             Colonoscopy Indications:           High risk colon cancer surveillance: Personal history                         of colonic polyps Providers:             Midge Minium MD, MD Referring MD:          Duncan Dull, MD (Referring MD) Medicines:             Propofol per Anesthesia Complications:         No immediate complications. Procedure:             Pre-Anesthesia Assessment:                        - Prior to the procedure, a History and Physical was                         performed, and patient medications and allergies were                         reviewed. The patient's tolerance of previous                         anesthesia was also reviewed. The risks and benefits                         of the procedure and the sedation options and risks                         were discussed with the patient. All questions were                         answered, and informed consent was obtained. Prior                         Anticoagulants: The patient has taken no anticoagulant                         or antiplatelet agents. ASA Grade Assessment: II - A                         patient with mild systemic disease. After reviewing                         the risks and benefits, the patient was deemed in                         satisfactory condition to undergo the procedure.                        After obtaining informed consent, the colonoscope was  passed under direct vision. Throughout the procedure,                         the patient's blood pressure, pulse, and oxygen                         saturations were monitored continuously. The                          Colonoscope was introduced through the anus and                         advanced to the the cecum, identified by appendiceal                         orifice and ileocecal valve. The colonoscopy was                         performed without difficulty. The patient tolerated                         the procedure well. The quality of the bowel                         preparation was excellent. Findings:      The perianal and digital rectal examinations were normal.      A 10 mm polyp was found in the descending colon. The polyp was sessile.       Preparations were made for mucosal resection. Chromoscopy with indigo       carmine was done. Demarcation of the lesion was performed during the       procedure to clearly identify the boundaries of the lesion. Saline with       indigo carmine was injected to raise the lesion. Snare mucosal resection       was performed. Resection and retrieval were complete. Resected tissue       margins were examined and clear of polyp tissue. To close a defect after       polypectomy, two hemostatic clips were successfully placed (MR       conditional). Clip manufacturer: [employer]. There was no       bleeding at the end of the procedure.      A 3 mm polyp was found in the descending colon. The polyp was sessile.       The polyp was removed with a cold snare. Resection and retrieval were       complete.      Non-bleeding internal hemorrhoids were found during retroflexion. The       hemorrhoids were Grade I (internal hemorrhoids that do not prolapse).      Two sessile polyps were found in the ascending colon. The polyps were 3       to 4 mm in size. These polyps were removed with a cold snare. Resection       and retrieval were complete. Impression:            - One 10 mm polyp in the descending colon, removed                         with mucosal resection. Resected and retrieved. Clips                         (  MR conditional) were placed.  Clip manufacturer:                         [employer].                        - One 3 mm polyp in the descending colon, removed with                         a cold snare. Resected and retrieved.                        - Non-bleeding internal hemorrhoids.                        - Two 3 to 4 mm polyps in the ascending colon, removed                         with a cold snare. Resected and retrieved.                        - Mucosal resection was performed. Resection and                         retrieval were complete. Recommendation:        - Discharge patient to home.                        - Resume previous diet.                        - Continue present medications.                        - Await pathology results.                        - Repeat colonoscopy in 3 years for surveillance. Procedure Code(s):     --- Professional ---                        [phone], Colonoscopy, flexible; with endoscopic mucosal                         resection                        45385, 59, Colonoscopy, flexible; with removal of                         tumor(s), polyp(s), or other lesion(s) by snare                         technique Diagnosis Code(s):     --- Professional ---                        Z86.010, Personal history of colonic polyps                        D12.4, Benign neoplasm of descending colon CPT copyright 2022 American Medical Association. All rights reserved. The codes documented  in this report are preliminary and upon coder review may  be revised to meet current compliance requirements. Midge Minium MD, MD 07/14/2023 10:00:12 AM This report has been signed electronically. Number of Addenda: 0 Note Initiated On: 07/14/2023 9:19 AM Scope Withdrawal Time: 0 hours 15 minutes 48 seconds  Total Procedure Duration: 0 hours 20 minutes 37 seconds  Estimated Blood Loss:  Estimated blood loss: none.      [facility]

## 2023-07-14 NOTE — Anesthesia Postprocedure Evaluation (Signed)
Anesthesia Post Note  Patient: Airline pilot  Procedure(s) Performed: COLONOSCOPY WITH PROPOFOL POLYPECTOMY  Patient location during evaluation: Endoscopy Anesthesia Type: General Level of consciousness: awake and alert Pain management: pain level controlled Vital Signs Assessment: post-procedure vital signs reviewed and stable Respiratory status: spontaneous breathing, nonlabored ventilation and respiratory function stable Cardiovascular status: blood pressure returned to baseline and stable Postop Assessment: no apparent nausea or vomiting Anesthetic complications: no   No notable events documented.   Last Vitals:  Vitals:   07/14/23 1013 07/14/23 1023  BP: 131/66 138/67  Pulse: (!) 57 (!) 56  Resp: 17 14  Temp:    SpO2: 100% 100%    Last Pain:  Vitals:   07/14/23 1023  TempSrc:   PainSc: 0-No pain                 Foye Deer

## 2023-07-14 NOTE — Anesthesia Procedure Notes (Signed)
Procedure Name: General with mask airway Date/Time: 07/14/2023 9:35 AM  Performed by: Lily Lovings, CRNAPre-anesthesia Checklist: Patient identified and Emergency Drugs available Patient Re-evaluated:Patient Re-evaluated prior to induction Oxygen Delivery Method: Simple face mask Preoxygenation: Pre-oxygenation with 100% oxygen Induction Type: IV induction

## 2023-07-14 NOTE — Anesthesia Preprocedure Evaluation (Addendum)
Anesthesia Evaluation  Patient identified by MRN, date of birth, ID band Patient awake    Reviewed: Allergy & Precautions, H&P , NPO status , Patient's Chart, lab work & pertinent test results  Airway Mallampati: II  TM Distance: >3 FB Neck ROM: full    Dental no notable dental hx.    Pulmonary asthma , sleep apnea    Pulmonary exam normal        Cardiovascular Exercise Tolerance: Good hypertension, pulmonary hypertension+ CAD and + DOE  Normal cardiovascular exam     Neuro/Psych  PSYCHIATRIC DISORDERS      negative neurological ROS     GI/Hepatic negative GI ROS, Neg liver ROS,,,  Endo/Other  diabetes    Renal/GU negative Renal ROS  negative genitourinary   Musculoskeletal   Abdominal  (+) + obese  Peds  Hematology negative hematology ROS (+)   Anesthesia Other Findings Past Medical History: 1990: Asthma 12/11/2017: Chronic respiratory failure with hypoxia (HCC) No date: Hypertension No date: Obesity, Class III, BMI 40-49.9 (morbid obesity) (HCC)     Comment:  weight fluctuations of 100 lbs   Past Surgical History: 2001: ABDOMINAL HYSTERECTOMY No date: BREAST BIOPSY; Right     Comment:  Neg 1990s: BREAST EXCISIONAL BIOPSY; Right     Comment:  multiple hematomas 1970s: CHOLECYSTECTOMY 03/30/2018: COLONOSCOPY WITH PROPOFOL; N/A     Comment:  Procedure: COLONOSCOPY WITH PROPOFOL;  Surgeon: Midge Minium, MD;  Location: [facility] ENDOSCOPY;  Service:               Endoscopy;  Laterality: N/A; No date: JOINT REPLACEMENT     Comment:  Bilateral 29562: STOMACH SURGERY     Comment:  gastric partitionin( for obesity)      Reproductive/Obstetrics negative OB ROS                              Anesthesia Physical Anesthesia Plan  ASA: 3  Anesthesia Plan: General   Post-op Pain Management:    Induction: Intravenous  PONV Risk Score and Plan: Propofol infusion and  TIVA  Airway Management Planned: Natural Airway  Additional Equipment:   Intra-op Plan:   Post-operative Plan:   Informed Consent: I have reviewed the patients History and Physical, chart, labs and discussed the procedure including the risks, benefits and alternatives for the proposed anesthesia with the patient or authorized representative who has indicated his/her understanding and acceptance.     Dental Advisory Given  Plan Discussed with: CRNA and Surgeon  Anesthesia Plan Comments:          Anesthesia Quick Evaluation

## 2023-07-15 ENCOUNTER — Other Ambulatory Visit: Payer: Self-pay | Admitting: Internal Medicine

## 2023-07-15 ENCOUNTER — Encounter: Payer: Self-pay | Admitting: Gastroenterology

## 2023-07-16 ENCOUNTER — Encounter: Payer: Self-pay | Admitting: Gastroenterology

## 2023-07-20 ENCOUNTER — Telehealth: Payer: Self-pay | Admitting: Internal Medicine

## 2023-07-20 NOTE — Telephone Encounter (Signed)
Pt called stating she would like to be called regarding issues with her diabetic supplies

## 2023-07-22 ENCOUNTER — Other Ambulatory Visit: Payer: Self-pay | Admitting: Internal Medicine

## 2023-07-22 NOTE — Telephone Encounter (Signed)
Called [facility] and they stated that they need pt's last two office notes before they can send out the [place] sensors. Office notes have been faxed and pt is aware.

## 2023-07-27 ENCOUNTER — Other Ambulatory Visit: Payer: Self-pay | Admitting: Internal Medicine

## 2023-07-29 DIAGNOSIS — E1169 Type 2 diabetes mellitus with other specified complication: Secondary | ICD-10-CM | POA: Diagnosis not present

## 2023-08-26 ENCOUNTER — Telehealth: Payer: Self-pay | Admitting: Internal Medicine

## 2023-08-26 ENCOUNTER — Other Ambulatory Visit: Payer: Self-pay | Admitting: Internal Medicine

## 2023-08-26 DIAGNOSIS — G4700 Insomnia, unspecified: Secondary | ICD-10-CM

## 2023-08-26 NOTE — Telephone Encounter (Signed)
Refilled: 04/16/2023 Last OV: 05/04/2023 Next OV: 11/04/2023

## 2023-08-26 NOTE — Telephone Encounter (Signed)
Patient called and wanted to let Shanda Bumps know that she is almost out of her Ozempic. Patient picks up her mediation at office.

## 2023-08-31 NOTE — Telephone Encounter (Signed)
Attempted to call pt. No answer.

## 2023-09-01 NOTE — Telephone Encounter (Signed)
Attempted to call. Sounds like someone answering the phone but no one says anything and then the phone hangs up.

## 2023-09-09 ENCOUNTER — Telehealth: Payer: Self-pay

## 2023-09-09 NOTE — Telephone Encounter (Signed)
Error

## 2023-09-09 NOTE — Telephone Encounter (Signed)
Patient states we usually call her to come pick up her Ozempic, but she hasn't heard from [place] and she is out.  Patient states she is supposed to take her next injection this Sunday.

## 2023-09-09 NOTE — Telephone Encounter (Signed)
Spoke with [employer] they have processed a refill for pt however it will not be here for 10 to 14 days. Can we give pt a sample to hold her over? Pt is currently taking the Ozempic 2 mg but we only have the 1 mg dose.

## 2023-09-10 NOTE — Telephone Encounter (Signed)
Pt is aware and will pick medication up today or tomorrow.

## 2023-09-10 NOTE — Telephone Encounter (Signed)
Medication Samples have been provided to the patient.  Drug name: Ozempic       Strength: 1 mg        Qty: 1 box  LOT: [id number]  Exp.Date: 06/30/2025  Dosing instructions: Inject 2 of the 1 mg into sckin once weekly.   The patient has been instructed regarding the correct time, dose, and frequency of taking this medication, including desired effects and most common side effects.   Katie Huber 11:05 AM 09/10/2023

## 2023-09-15 ENCOUNTER — Telehealth: Payer: Self-pay

## 2023-09-15 NOTE — Telephone Encounter (Signed)
Phone call to [facility] Patient Assistance [phone]. We only received 1 box of Ozempic.  This was a mistake and they will be sending 3 more boxes for a total of 4 boxes for a 120 day supply.

## 2023-09-15 NOTE — Telephone Encounter (Signed)
Spoke with pt to let her know that we have received her pt assistance medication and it is ready for pick up.    Ozempic 2 mg: 1 box

## 2023-09-17 ENCOUNTER — Ambulatory Visit: Payer: Medicare Other | Admitting: Nurse Practitioner

## 2023-09-17 ENCOUNTER — Encounter: Payer: Self-pay | Admitting: Nurse Practitioner

## 2023-09-17 VITALS — BP 116/74 | HR 68 | Temp 98.0°F | Ht 67.0 in | Wt 237.0 lb

## 2023-09-17 DIAGNOSIS — L989 Disorder of the skin and subcutaneous tissue, unspecified: Secondary | ICD-10-CM

## 2023-09-17 MED ORDER — MUPIROCIN 2 % EX OINT: 1.0000 | TOPICAL_OINTMENT | Freq: Two times a day (BID) | CUTANEOUS | 0 refills | Status: DC

## 2023-09-17 MED ORDER — SULFAMETHOXAZOLE-TRIMETHOPRIM 800-160 MG PO TABS: 1.0000 | ORAL_TABLET | Freq: Two times a day (BID) | ORAL | 0 refills | Status: DC

## 2023-09-17 NOTE — Telephone Encounter (Signed)
Pt has picked up medication.

## 2023-09-17 NOTE — Progress Notes (Signed)
Established Patient Office Visit  Subjective:  Patient ID: Katie Huber, female    DOB: Mar 12, 1951  Age: 72 y.o. MRN: [id number]  CC:  Chief Complaint  Patient presents with   Acute Visit    Saturday she was at a ball game and when she left she had a silver dollar sized blister on her left foot on the inside of the foot Swollen, pink and draining & painful    HPI  Katie Huber presents for a dollar sized blister on the left medial foot near the ankle.  Patient states that she went to watch a ball game and was not grass but on the concrete floor.  When she started walking she felt like a rock in her shoe but when patient checked it it was a big blister.  Patient is unsure how she got the blister.  The blister popped and the clear discharge is oozing out.  She has some pain with walking.   HPI   Past Medical History:  Diagnosis Date   Asthma 1990   Chronic respiratory failure with hypoxia (HCC) 12/11/2017   Hypertension    Obesity, Class III, BMI 40-49.9 (morbid obesity) (HCC)    weight fluctuations of 100 lbs     Past Surgical History:  Procedure Laterality Date   ABDOMINAL HYSTERECTOMY  2001   BREAST BIOPSY Right    Neg   BREAST EXCISIONAL BIOPSY Right 1990s   multiple hematomas   CHOLECYSTECTOMY  1970s   COLONOSCOPY WITH PROPOFOL N/A 03/30/2018   Procedure: COLONOSCOPY WITH PROPOFOL;  Surgeon: Midge Minium, MD;  Location: [facility] ENDOSCOPY;  Service: Endoscopy;  Laterality: N/A;   COLONOSCOPY WITH PROPOFOL N/A 07/14/2023   Procedure: COLONOSCOPY WITH PROPOFOL;  Surgeon: Midge Minium, MD;  Location: [facility] ENDOSCOPY;  Service: Endoscopy;  Laterality: N/A;   HOT HEMOSTASIS  07/14/2023   Procedure: HOT HEMOSTASIS (ARGON PLASMA COAGULATION/BICAP);  Surgeon: Midge Minium, MD;  Location: [facility] ENDOSCOPY;  Service: Endoscopy;;   JOINT REPLACEMENT     Bilateral   POLYPECTOMY  07/14/2023   Procedure: POLYPECTOMY;  Surgeon: Midge Minium, MD;  Location: [facility] ENDOSCOPY;  Service:  Endoscopy;;   STOMACH SURGERY  [phone]   gastric partitionin( for obesity)     Family History  Problem Relation Age of Onset   Heart disease Father    COPD Father    Hyperlipidemia Father    Mental illness Brother    Breast cancer Neg Hx     Social History   Socioeconomic History   Marital status: Single    Spouse name: Not on file   Number of children: Not on file   Years of education: Not on file   Highest education level: Bachelor's degree (e.g., BA, AB, BS)  Occupational History   Not on file  Tobacco Use   Smoking status: Never   Smokeless tobacco: Never  Vaping Use   Vaping status: Never Used  Substance and Sexual Activity   Alcohol use: Yes    Comment: ocassionally   Drug use: No   Sexual activity: Never  Other Topics Concern   Not on file  Social History Narrative   Not on file   Social Determinants of Health   Financial Resource Strain: Low Risk  (05/04/2023)   Overall Financial Resource Strain (CARDIA)    Difficulty of Paying Living Expenses: Not hard at all  Food Insecurity: No Food Insecurity (05/04/2023)   Hunger Vital Sign    Worried About Running Out of Food in the Last Year:  Never true    Ran Out of Food in the Last Year: Never true  Transportation Needs: No Transportation Needs (05/04/2023)   PRAPARE - Administrator, Civil Service (Medical): No    Lack of Transportation (Non-Medical): No  Physical Activity: Insufficiently Active (05/04/2023)   Exercise Vital Sign    Days of Exercise per Week: 3 days    Minutes of Exercise per Session: 40 min  Stress: No Stress Concern Present (05/04/2023)   [employer] of Occupational Health - Occupational Stress Questionnaire    Feeling of Stress : Only a little  Social Connections: Moderately Integrated (05/04/2023)   Social Connection and Isolation Panel [NHANES]    Frequency of Communication with Friends and Family: More than three times a week    Frequency of Social Gatherings with Friends and  Family: Twice a week    Attends Religious Services: More than 4 times per year    Active Member of [employer] or Organizations: Yes    Attends Engineer, structural: More than 4 times per year    Marital Status: Divorced  Intimate Partner Violence: Not At Risk (03/06/2023)   Humiliation, Afraid, Rape, and Kick questionnaire    Fear of Current or Ex-Partner: No    Emotionally Abused: No    Physically Abused: No    Sexually Abused: No     Outpatient Medications Prior to Visit  Medication Sig Dispense Refill   albuterol (PROVENTIL) (2.5 MG/3ML) 0.083% nebulizer solution Take 3 mLs (2.5 mg total) by nebulization every 6 (six) hours as needed for wheezing or shortness of breath. 150 mL 1   albuterol (VENTOLIN HFA) 108 (90 Base) MCG/ACT inhaler Inhale 2 puffs into the lungs every 6 (six) hours as needed for wheezing or shortness of breath. 3 each 1   amLODipine (NORVASC) 10 MG tablet TAKE 1 TABLET BY MOUTH DAILY 90 tablet 3   atorvastatin (LIPITOR) 20 MG tablet Take 1 tablet (20 mg total) by mouth daily. 90 tablet 3   carvedilol (COREG) 3.125 MG tablet Take 3.125 mg by mouth 2 (two) times daily with a meal.     Continuous Blood Gluc Sensor (FREESTYLE LIBRE 14 DAY SENSOR) MISC For diabetes management 6 each 3   Continuous Glucose Sensor (FREESTYLE LIBRE 2 SENSOR) MISC For diabetes management 6 each 3   cyanocobalamin (VITAMIN B12) 1000 MCG/ML injection Inject 1 mL (1,000 mcg total) into the muscle once a week. For 4 weeks,  then monthly thereafter 1 mL 11   cyclobenzaprine (FLEXERIL) 10 MG tablet TAKE ONE TABLET BY MOUTH 3 TIMES DAILY AS NEEDED FOR MUSCLE SPASMS 30 tablet 5   folic acid (FOLVITE) 1 MG tablet Take 1 tablet (1 mg total) by mouth daily. 90 tablet 3   furosemide (LASIX) 20 MG tablet TAKE ONE TABLET EVERY DAY AS NEEDED FOR FLUID RETENTION 90 tablet 1   gabapentin (NEURONTIN) 300 MG capsule TAKE TWO CAPSULES IN THE MORNING, THREE CAPSULES IN THE AFTERNOON, AND THREE CAPSULES IN THE  EVENING. 720 capsule 1   losartan (COZAAR) 100 MG tablet Take 1 tablet (100 mg total) by mouth daily. 90 tablet 1   montelukast (SINGULAIR) 10 MG tablet TAKE 1 TABLET BY MOUTH NIGHTLY 90 tablet 3   omeprazole (PRILOSEC) 20 MG capsule TAKE 1 CAPSULE BY MOUTH TWICE DAILY 180 capsule 3   PARoxetine (PAXIL) 20 MG tablet TAKE 1 TABLET BY MOUTH EVERY MORNING 90 tablet 3   promethazine (PHENERGAN) 25 MG tablet TAKE 1 TABLET BY  MOUTH EVERY 8 HOURS AS NEEDED FOR NAUSEA AND VOMITING 90 tablet 0   Semaglutide, 2 MG/DOSE, (OZEMPIC, 2 MG/DOSE,) 8 MG/3ML SOPN Inject 2 mg into the skin once a week.     Syringe/Needle, Disp, (SYRINGE 3CC/25GX1") 25G X 1" 3 ML MISC Use for b12 injections 50 each 0   triamterene-hydrochlorothiazide (MAXZIDE-25) 37.5-25 MG tablet TAKE ONE TABLET BY MOUTH DAILY 90 tablet 1   zolpidem (AMBIEN) 10 MG tablet TAKE ONE TABLET (10 MG) BY MOUTH AT BEDTIME AS NEEDED FOR SLEEP 30 tablet 5   Facility-Administered Medications Prior to Visit  Medication Dose Route Frequency Provider Last Rate Last Admin   pneumococcal 13-valent conjugate vaccine (PREVNAR 13) injection 0.5 mL  0.5 mL Intramuscular Once Sherlene Shams, MD        No Known Allergies  ROS Review of Systems Negative unless indicated in HPI.    Objective:    Physical Exam Constitutional:      Appearance: Normal appearance.  HENT:     Head: Normocephalic.  Cardiovascular:     Rate and Rhythm: Normal rate and regular rhythm.     Pulses: Normal pulses.     Heart sounds: Normal heart sounds.  Pulmonary:     Effort: Pulmonary effort is normal.     Breath sounds: Normal breath sounds. No stridor. No wheezing.  Musculoskeletal:     Comments: Left foot : Dollar sized bullae with clear discharge.   Neurological:     Mental Status: She is alert.     BP 116/74   Pulse 68   Temp 98 F (36.7 C)   Ht 5\' 7"  (1.702 m)   Wt 237 lb (107.5 kg)   SpO2 98%   BMI 37.12 kg/m  Wt Readings from Last 3 Encounters:  09/22/23  233 lb 9.6 oz (106 kg)  09/17/23 237 lb (107.5 kg)  07/14/23 229 lb 9.6 oz (104.1 kg)     Health Maintenance  Topic Date Due   OPHTHALMOLOGY EXAM  06/21/2023   DTaP/Tdap/Td (2 - Td or Tdap) 09/21/2023   Diabetic kidney evaluation - Urine ACR  10/21/2023   INFLUENZA VACCINE  02/29/2024 (Originally 07/02/2023)   HEMOGLOBIN A1C  11/03/2023   Medicare Annual Wellness (AWV)  03/05/2024   Diabetic kidney evaluation - eGFR measurement  05/03/2024   FOOT EXAM  05/03/2024   MAMMOGRAM  06/30/2024   Colonoscopy  07/13/2026   Pneumonia Vaccine 62+ Years old  Completed   DEXA SCAN  Completed   Hepatitis C Screening  Completed   Zoster Vaccines- Shingrix  Completed   HPV VACCINES  Aged Out   COVID-19 Vaccine  Discontinued   Fecal DNA (Cologuard)  Discontinued    There are no preventive care reminders to display for this patient.  Lab Results  Component Value Date   TSH 0.88 05/04/2023   Lab Results  Component Value Date   WBC 4.7 09/22/2023   HGB 12.7 09/22/2023   HCT 37.7 09/22/2023   MCV 95.1 09/22/2023   PLT 210.0 09/22/2023   Lab Results  Component Value Date   NA 135 05/04/2023   K 4.3 05/04/2023   CO2 30 05/04/2023   GLUCOSE 91 05/04/2023   BUN 17 05/04/2023   CREATININE 0.79 05/04/2023   BILITOT 0.5 05/04/2023   ALKPHOS 73 05/04/2023   AST 19 05/04/2023   ALT 13 05/04/2023   PROT 6.7 05/04/2023   ALBUMIN 4.1 05/04/2023   CALCIUM 9.5 05/04/2023   ANIONGAP 15 03/19/2020   GFR  74.97 05/04/2023   Lab Results  Component Value Date   CHOL 159 05/04/2023   Lab Results  Component Value Date   HDL 73.40 05/04/2023   Lab Results  Component Value Date   LDLCALC 70 05/04/2023   Lab Results  Component Value Date   TRIG 75.0 05/04/2023   Lab Results  Component Value Date   CHOLHDL 2 05/04/2023   Lab Results  Component Value Date   HGBA1C 5.1 05/04/2023      Assessment & Plan:  Skin lesion of foot Assessment & Plan: Unknown etiology.  Will treat with  Bactrim twice a day for 10 days as a precaution against secondary infection.  Advised patient to use Bactroban ointment and cover the lesion. She will let us know if symptoms not improving.   Other orders -     Sulfamethoxazole-Trimethoprim; Take 1 tablet by mouth 2 (two) times daily.  Dispense: 20 tablet; Refill: 0 -     Mupirocin; Apply 1 Application topically 2 (two) times daily.  Dispense: 22 g; Refill: 0    Follow-up: Return if symptoms worsen or fail to improve.   Kara Dies, NP

## 2023-09-17 NOTE — Patient Instructions (Addendum)
Please apply Bactroban ointment to the affected area 2-3 times a day and cover the area. Bactrim sent to the pharmacy.

## 2023-09-22 ENCOUNTER — Ambulatory Visit (INDEPENDENT_AMBULATORY_CARE_PROVIDER_SITE_OTHER): Payer: Medicare Other | Admitting: Nurse Practitioner

## 2023-09-22 ENCOUNTER — Other Ambulatory Visit (HOSPITAL_COMMUNITY): Payer: Self-pay

## 2023-09-22 ENCOUNTER — Encounter: Payer: Self-pay | Admitting: Nurse Practitioner

## 2023-09-22 ENCOUNTER — Telehealth: Payer: Self-pay

## 2023-09-22 VITALS — BP 118/68 | HR 68 | Temp 98.3°F | Ht 67.0 in | Wt 233.6 lb

## 2023-09-22 DIAGNOSIS — L989 Disorder of the skin and subcutaneous tissue, unspecified: Secondary | ICD-10-CM | POA: Diagnosis not present

## 2023-09-22 DIAGNOSIS — T148XXA Other injury of unspecified body region, initial encounter: Secondary | ICD-10-CM | POA: Diagnosis not present

## 2023-09-22 NOTE — Telephone Encounter (Signed)
PAP: Application for Ozempic has been submitted to PAP Companies: [employer], online  RE ENROLLMENT FOR 2025

## 2023-09-22 NOTE — Progress Notes (Unsigned)
Established Patient Office Visit  Subjective:  Patient ID: Katie Huber, female    DOB: 01-Jun-1951  Age: 72 y.o. MRN: [id number]  CC:  Chief Complaint  Patient presents with  . Acute Visit    Insect bite not getting any better     HPI  Ginger Organ presents for:  HPI   Past Medical History:  Diagnosis Date  . Asthma 1990  . Chronic respiratory failure with hypoxia (HCC) 12/11/2017  . Hypertension   . Obesity, Class III, BMI 40-49.9 (morbid obesity) (HCC)    weight fluctuations of 100 lbs     Past Surgical History:  Procedure Laterality Date  . ABDOMINAL HYSTERECTOMY  2001  . BREAST BIOPSY Right    Neg  . BREAST EXCISIONAL BIOPSY Right 1990s   multiple hematomas  . CHOLECYSTECTOMY  1970s  . COLONOSCOPY WITH PROPOFOL N/A 03/30/2018   Procedure: COLONOSCOPY WITH PROPOFOL;  Surgeon: Midge Minium, MD;  Location: [facility] ENDOSCOPY;  Service: Endoscopy;  Laterality: N/A;  . COLONOSCOPY WITH PROPOFOL N/A 07/14/2023   Procedure: COLONOSCOPY WITH PROPOFOL;  Surgeon: Midge Minium, MD;  Location: [facility] ENDOSCOPY;  Service: Endoscopy;  Laterality: N/A;  . HOT HEMOSTASIS  07/14/2023   Procedure: HOT HEMOSTASIS (ARGON PLASMA COAGULATION/BICAP);  Surgeon: Midge Minium, MD;  Location: [facility] ENDOSCOPY;  Service: Endoscopy;;  . JOINT REPLACEMENT     Bilateral  . POLYPECTOMY  07/14/2023   Procedure: POLYPECTOMY;  Surgeon: Midge Minium, MD;  Location: [facility] ENDOSCOPY;  Service: Endoscopy;;  . STOMACH SURGERY  [phone]   gastric partitionin( for obesity)     Family History  Problem Relation Age of Onset  . Heart disease Father   . COPD Father   . Hyperlipidemia Father   . Mental illness Brother   . Breast cancer Neg Hx     Social History   Socioeconomic History  . Marital status: Single    Spouse name: Not on file  . Number of children: Not on file  . Years of education: Not on file  . Highest education level: Bachelor's degree (e.g., BA, AB, BS)  Occupational History  . Not on  file  Tobacco Use  . Smoking status: Never  . Smokeless tobacco: Never  Vaping Use  . Vaping status: Never Used  Substance and Sexual Activity  . Alcohol use: Yes    Comment: ocassionally  . Drug use: No  . Sexual activity: Never  Other Topics Concern  . Not on file  Social History Narrative  . Not on file   Social Determinants of Health   Financial Resource Strain: Low Risk  (05/04/2023)   Overall Financial Resource Strain (CARDIA)   . Difficulty of Paying Living Expenses: Not hard at all  Food Insecurity: No Food Insecurity (05/04/2023)   Hunger Vital Sign   . Worried About Programme researcher, broadcasting/film/video in the Last Year: Never true   . Ran Out of Food in the Last Year: Never true  Transportation Needs: No Transportation Needs (05/04/2023)   PRAPARE - Transportation   . Lack of Transportation (Medical): No   . Lack of Transportation (Non-Medical): No  Physical Activity: Insufficiently Active (05/04/2023)   Exercise Vital Sign   . Days of Exercise per Week: 3 days   . Minutes of Exercise per Session: 40 min  Stress: No Stress Concern Present (05/04/2023)   [employer] of Occupational Health - Occupational Stress Questionnaire   . Feeling of Stress : Only a little  Social Connections: Moderately Integrated (05/04/2023)  Social Connection and Isolation Panel [NHANES]   . Frequency of Communication with Friends and Family: More than three times a week   . Frequency of Social Gatherings with Friends and Family: Twice a week   . Attends Religious Services: More than 4 times per year   . Active Member of Clubs or Organizations: Yes   . Attends Banker Meetings: More than 4 times per year   . Marital Status: Divorced  Catering manager Violence: Not At Risk (03/06/2023)   Humiliation, Afraid, Rape, and Kick questionnaire   . Fear of Current or Ex-Partner: No   . Emotionally Abused: No   . Physically Abused: No   . Sexually Abused: No     Outpatient Medications Prior to Visit   Medication Sig Dispense Refill  . albuterol (PROVENTIL) (2.5 MG/3ML) 0.083% nebulizer solution Take 3 mLs (2.5 mg total) by nebulization every 6 (six) hours as needed for wheezing or shortness of breath. 150 mL 1  . albuterol (VENTOLIN HFA) 108 (90 Base) MCG/ACT inhaler Inhale 2 puffs into the lungs every 6 (six) hours as needed for wheezing or shortness of breath. 3 each 1  . amLODipine (NORVASC) 10 MG tablet TAKE 1 TABLET BY MOUTH DAILY 90 tablet 3  . atorvastatin (LIPITOR) 20 MG tablet Take 1 tablet (20 mg total) by mouth daily. 90 tablet 3  . carvedilol (COREG) 3.125 MG tablet Take 3.125 mg by mouth 2 (two) times daily with a meal.    . Continuous Blood Gluc Sensor (FREESTYLE LIBRE 14 DAY SENSOR) MISC For diabetes management 6 each 3  . Continuous Glucose Sensor (FREESTYLE LIBRE 2 SENSOR) MISC For diabetes management 6 each 3  . cyanocobalamin (VITAMIN B12) 1000 MCG/ML injection Inject 1 mL (1,000 mcg total) into the muscle once a week. For 4 weeks,  then monthly thereafter 1 mL 11  . cyclobenzaprine (FLEXERIL) 10 MG tablet TAKE ONE TABLET BY MOUTH 3 TIMES DAILY AS NEEDED FOR MUSCLE SPASMS 30 tablet 5  . folic acid (FOLVITE) 1 MG tablet Take 1 tablet (1 mg total) by mouth daily. 90 tablet 3  . furosemide (LASIX) 20 MG tablet TAKE ONE TABLET EVERY DAY AS NEEDED FOR FLUID RETENTION 90 tablet 1  . gabapentin (NEURONTIN) 300 MG capsule TAKE TWO CAPSULES IN THE MORNING, THREE CAPSULES IN THE AFTERNOON, AND THREE CAPSULES IN THE EVENING. 720 capsule 1  . losartan (COZAAR) 100 MG tablet Take 1 tablet (100 mg total) by mouth daily. 90 tablet 1  . montelukast (SINGULAIR) 10 MG tablet TAKE 1 TABLET BY MOUTH NIGHTLY 90 tablet 3  . mupirocin ointment (BACTROBAN) 2 % Apply 1 Application topically 2 (two) times daily. 22 g 0  . omeprazole (PRILOSEC) 20 MG capsule TAKE 1 CAPSULE BY MOUTH TWICE DAILY 180 capsule 3  . PARoxetine (PAXIL) 20 MG tablet TAKE 1 TABLET BY MOUTH EVERY MORNING 90 tablet 3  .  promethazine (PHENERGAN) 25 MG tablet TAKE 1 TABLET BY MOUTH EVERY 8 HOURS AS NEEDED FOR NAUSEA AND VOMITING 90 tablet 0  . Semaglutide, 2 MG/DOSE, (OZEMPIC, 2 MG/DOSE,) 8 MG/3ML SOPN Inject 2 mg into the skin once a week.    . sulfamethoxazole-trimethoprim (BACTRIM DS) 800-160 MG tablet Take 1 tablet by mouth 2 (two) times daily. 20 tablet 0  . Syringe/Needle, Disp, (SYRINGE 3CC/25GX1") 25G X 1" 3 ML MISC Use for b12 injections 50 each 0  . triamterene-hydrochlorothiazide (MAXZIDE-25) 37.5-25 MG tablet TAKE ONE TABLET BY MOUTH DAILY 90 tablet 1  .  zolpidem (AMBIEN) 10 MG tablet TAKE ONE TABLET (10 MG) BY MOUTH AT BEDTIME AS NEEDED FOR SLEEP 30 tablet 5   Facility-Administered Medications Prior to Visit  Medication Dose Route Frequency Provider Last Rate Last Admin  . pneumococcal 13-valent conjugate vaccine (PREVNAR 13) injection 0.5 mL  0.5 mL Intramuscular Once Sherlene Shams, MD        No Known Allergies  ROS Review of Systems Negative unless indicated in HPI.    Objective:    Physical Exam  BP 118/68   Pulse 68   Temp 98.3 F (36.8 C)   Ht 5\' 7"  (1.702 m)   Wt 233 lb 9.6 oz (106 kg)   SpO2 96%   BMI 36.59 kg/m  Wt Readings from Last 3 Encounters:  09/22/23 233 lb 9.6 oz (106 kg)  09/17/23 237 lb (107.5 kg)  07/14/23 229 lb 9.6 oz (104.1 kg)     Health Maintenance  Topic Date Due  . OPHTHALMOLOGY EXAM  06/21/2023  . DTaP/Tdap/Td (2 - Td or Tdap) 09/21/2023  . Diabetic kidney evaluation - Urine ACR  10/21/2023  . INFLUENZA VACCINE  02/29/2024 (Originally 07/02/2023)  . HEMOGLOBIN A1C  11/03/2023  . Medicare Annual Wellness (AWV)  03/05/2024  . Diabetic kidney evaluation - eGFR measurement  05/03/2024  . FOOT EXAM  05/03/2024  . MAMMOGRAM  06/30/2024  . Colonoscopy  07/13/2026  . Pneumonia Vaccine 70+ Years old  Completed  . DEXA SCAN  Completed  . Hepatitis C Screening  Completed  . Zoster Vaccines- Shingrix  Completed  . HPV VACCINES  Aged Out  . COVID-19  Vaccine  Discontinued  . Fecal DNA (Cologuard)  Discontinued    There are no preventive care reminders to display for this patient.  Lab Results  Component Value Date   TSH 0.88 05/04/2023   Lab Results  Component Value Date   WBC 5.5 05/04/2023   HGB 12.1 05/04/2023   HCT 36.5 05/04/2023   MCV 93.9 05/04/2023   PLT 211.0 05/04/2023   Lab Results  Component Value Date   NA 135 05/04/2023   K 4.3 05/04/2023   CO2 30 05/04/2023   GLUCOSE 91 05/04/2023   BUN 17 05/04/2023   CREATININE 0.79 05/04/2023   BILITOT 0.5 05/04/2023   ALKPHOS 73 05/04/2023   AST 19 05/04/2023   ALT 13 05/04/2023   PROT 6.7 05/04/2023   ALBUMIN 4.1 05/04/2023   CALCIUM 9.5 05/04/2023   ANIONGAP 15 03/19/2020   GFR 74.97 05/04/2023   Lab Results  Component Value Date   CHOL 159 05/04/2023   Lab Results  Component Value Date   HDL 73.40 05/04/2023   Lab Results  Component Value Date   LDLCALC 70 05/04/2023   Lab Results  Component Value Date   TRIG 75.0 05/04/2023   Lab Results  Component Value Date   CHOLHDL 2 05/04/2023   Lab Results  Component Value Date   HGBA1C 5.1 05/04/2023      Assessment & Plan:  There are no diagnoses linked to this encounter.  Follow-up: No follow-ups on file.   Kara Dies, NP

## 2023-09-23 DIAGNOSIS — L989 Disorder of the skin and subcutaneous tissue, unspecified: Secondary | ICD-10-CM | POA: Insufficient documentation

## 2023-09-23 DIAGNOSIS — R238 Other skin changes: Secondary | ICD-10-CM | POA: Insufficient documentation

## 2023-09-23 LAB — CBC WITH DIFFERENTIAL/PLATELET
Basophils Absolute: 0.1 10*3/uL (ref 0.0–0.1)
Basophils Relative: 1.2 % (ref 0.0–3.0)
Eosinophils Absolute: 0.1 10*3/uL (ref 0.0–0.7)
Eosinophils Relative: 2.5 % (ref 0.0–5.0)
HCT: 37.7 % (ref 36.0–46.0)
Hemoglobin: 12.7 g/dL (ref 12.0–15.0)
Lymphocytes Relative: 33.9 % (ref 12.0–46.0)
Lymphs Abs: 1.6 10*3/uL (ref 0.7–4.0)
MCHC: 33.6 g/dL (ref 30.0–36.0)
MCV: 95.1 fL (ref 78.0–100.0)
Monocytes Absolute: 0.5 10*3/uL (ref 0.1–1.0)
Monocytes Relative: 10.7 % (ref 3.0–12.0)
Neutro Abs: 2.4 10*3/uL (ref 1.4–7.7)
Neutrophils Relative %: 51.7 % (ref 43.0–77.0)
Platelets: 210 10*3/uL (ref 150.0–400.0)
RBC: 3.96 Mil/uL (ref 3.87–5.11)
RDW: 13.3 % (ref 11.5–15.5)
WBC: 4.7 10*3/uL (ref 4.0–10.5)

## 2023-09-23 NOTE — Assessment & Plan Note (Addendum)
Unknown etiology.  Will treat with Bactrim twice a day for 10 days as a precaution against secondary infection.  Advised patient to use Bactroban ointment and cover the lesion. She will let us know if symptoms not improving.

## 2023-09-23 NOTE — Assessment & Plan Note (Addendum)
Tenderness to palpitation around the lesion.  Lesion with serosanguineous discharge, without any odor.  Collected culture sample from the discharge of the lesion.  Will check CBCs. Advised patient to continue Bactrim. If symptoms not improving will refer to wound care for further evaluation.

## 2023-09-25 NOTE — Addendum Note (Signed)
Addended by: Kara Dies on: 09/25/2023 07:18 AM   Modules accepted: Orders

## 2023-09-26 LAB — WOUND CULTURE
MICRO NUMBER:: 15626781
RESULT:: NO GROWTH
SPECIMEN QUALITY:: ADEQUATE

## 2023-09-28 ENCOUNTER — Telehealth: Payer: Self-pay

## 2023-09-28 NOTE — Telephone Encounter (Signed)
Spoke to pt. Pt states she has not heard from Podiatry. Pt stated she thought was an urgent referral informed pt I would reach out to see if there was any update. Referral was placed on 09/25/23

## 2023-09-28 NOTE — Telephone Encounter (Signed)
Noted  

## 2023-09-28 NOTE — Telephone Encounter (Signed)
Lvm for pt to confirm appt with Podiatry

## 2023-09-28 NOTE — Telephone Encounter (Signed)
Informed pt Ozempic was ready for pick up.  3 boxes of Ozempic 8mg /72ml pens  Exp: 05/30/26  Lot: [id number]

## 2023-10-05 NOTE — Telephone Encounter (Signed)
Pt picked up medication.

## 2023-10-07 ENCOUNTER — Ambulatory Visit: Payer: Medicare Other | Admitting: Podiatry

## 2023-10-07 ENCOUNTER — Encounter: Payer: Self-pay | Admitting: Podiatry

## 2023-10-07 VITALS — Ht 67.0 in | Wt 233.6 lb

## 2023-10-07 DIAGNOSIS — R600 Localized edema: Secondary | ICD-10-CM

## 2023-10-07 DIAGNOSIS — W57XXXS Bitten or stung by nonvenomous insect and other nonvenomous arthropods, sequela: Secondary | ICD-10-CM

## 2023-10-07 NOTE — Progress Notes (Signed)
  Subjective:  Patient ID: Katie Huber, female    DOB: Aug 14, 1951,  MRN: [id number]  Chief Complaint  Patient presents with   Foot Pain    Pt is here due to possible spider bite, happen 5 weeks ago pt states she felt like a pebble was in her shoe took it off and there was a huge blister on her foot, blister bussed 4-5 days after, seen primary care doctor for the issue was on an antibiotic for 14 days, state site has heal but still some redness and swollen areas.    Discussed the use of AI scribe software for clinical note transcription with the patient, who gave verbal consent to proceed.  History of Present Illness   The patient, with a history of diabetes, presented with a healed blister on the heel that had developed five weeks prior. The blister appeared suddenly after attending a football game, initially presenting as an itch that later developed into a blister. The patient reported significant discomfort and swelling, which was managed with Tylenol and topical antibiotics. The patient also reported self-debriding the dead skin around the blister. The patient has been unable to wear shoes for five weeks due to the discomfort. Despite the healing, the patient reports persistent swelling around the ankle. The patient's diabetes is reportedly well-controlled, with recent A1c levels at 4.5.          Objective:    Physical Exam   EXTREMITIES: Left foot warm and well perfused, previous blister with central induration, no active drainage, signs of infection or ulceration, strong palpable pulses of the dorsalis pedis and posterior tibial arteries, good capillary refill time.       No images are attached to the encounter.    Results   Procedure: Debridement of necrotic tissue Description: Debrided necrotic tissue from the heel. Observed a central area where the blister originated with no visible foreign body. No active drainage, signs of infection, or ulceration.  LABS A1c: 4.5       Assessment:   1. Bug bite, sequela      Plan:  Patient was evaluated and treated and all questions answered.  Assessment and Plan    Healed Foot Lesion   The lesion, likely secondary to an insect bite, presented as a blister that ruptured and has been healing over the past 5 weeks, shows no signs of infection or ulceration. Circulation is good, evidenced by strong palpable pulses of the DP and PT and good capillary refill time. We will continue with wound care, cleaning the area, applying Neosporin, and covering it. She should apply a good moisturizing lotion or Vaseline to aid in skin healing and resume wearing closed shoes as comfort allows. She should return for follow-up if the lesion worsens or recurs.          Return if symptoms worsen or fail to improve.

## 2023-10-12 ENCOUNTER — Telehealth: Payer: Self-pay | Admitting: Internal Medicine

## 2023-10-12 DIAGNOSIS — I10 Essential (primary) hypertension: Secondary | ICD-10-CM

## 2023-10-12 DIAGNOSIS — E1169 Type 2 diabetes mellitus with other specified complication: Secondary | ICD-10-CM

## 2023-10-12 NOTE — Telephone Encounter (Signed)
Patient see Dr Darrick Huntsman in January and would like to do  labs before appt. No Lap orders in chart.

## 2023-10-12 NOTE — Telephone Encounter (Signed)
I have pended labs for your approval. If pt needs these before appt in January I call her to get scheduled.

## 2023-10-14 ENCOUNTER — Other Ambulatory Visit (HOSPITAL_COMMUNITY): Payer: Self-pay

## 2023-10-14 NOTE — Telephone Encounter (Signed)
Received fax from [employer], requesting missing insurance information, filled and faxed back form along with front and back of [employer] medicare part D insurance card

## 2023-10-14 NOTE — Telephone Encounter (Signed)
Spoke with pt and scheduled lab appt

## 2023-10-19 ENCOUNTER — Other Ambulatory Visit: Payer: Self-pay | Admitting: Internal Medicine

## 2023-10-23 ENCOUNTER — Telehealth: Payer: Self-pay | Admitting: Internal Medicine

## 2023-10-23 NOTE — Telephone Encounter (Signed)
Patient called and wanted to speak to [facility]. She has not received her [place]. The company that she goes through has not sent any. Please call patient, [phone].

## 2023-10-28 NOTE — Telephone Encounter (Signed)
Spoke with pt to let her know that I reached out the [employer] the company that was supplying her sensors and they gave me the number to a different company called [employer]. Called [employer] to give them the information so they could send her the sensors. [employer] stated that they will be shipping out 6 sensors to her. Pt is aware and gave a verbal understanding.   [employer] # [id number]

## 2023-11-02 NOTE — Telephone Encounter (Signed)
PAP: Patient assistance application for Ozempic has been approved by PAP Companies: [employer] from 12/02/2023 to 11/30/2024. Medication should be delivered to PAP Delivery: Provider's office For further shipping updates, please contact [employer] at [phone] Pt ID is: [id number]

## 2023-11-04 ENCOUNTER — Ambulatory Visit: Payer: Medicare Other | Admitting: Internal Medicine

## 2023-11-20 ENCOUNTER — Other Ambulatory Visit (INDEPENDENT_AMBULATORY_CARE_PROVIDER_SITE_OTHER): Payer: Medicare Other

## 2023-11-20 DIAGNOSIS — E785 Hyperlipidemia, unspecified: Secondary | ICD-10-CM | POA: Diagnosis not present

## 2023-11-20 DIAGNOSIS — E669 Obesity, unspecified: Secondary | ICD-10-CM

## 2023-11-20 DIAGNOSIS — I10 Essential (primary) hypertension: Secondary | ICD-10-CM

## 2023-11-20 DIAGNOSIS — E538 Deficiency of other specified B group vitamins: Secondary | ICD-10-CM | POA: Diagnosis not present

## 2023-11-20 DIAGNOSIS — E1169 Type 2 diabetes mellitus with other specified complication: Secondary | ICD-10-CM | POA: Diagnosis not present

## 2023-11-20 LAB — LIPID PANEL
Cholesterol: 137 mg/dL (ref 0–200)
HDL: 60.5 mg/dL (ref >39.00)
LDL Cholesterol: 63 mg/dL (ref 0–99)
NonHDL: 76.12
Total CHOL/HDL Ratio: 2
Triglycerides: 68 mg/dL (ref 0.0–149.0)
VLDL: 13.6 mg/dL (ref 0.0–40.0)

## 2023-11-20 LAB — COMPREHENSIVE METABOLIC PANEL
ALT: 98 U/L — ABNORMAL HIGH (ref 0–35)
AST: 64 U/L — ABNORMAL HIGH (ref 0–37)
Albumin: 4.3 g/dL (ref 3.5–5.2)
Alkaline Phosphatase: 110 U/L (ref 39–117)
BUN: 15 mg/dL (ref 6–23)
CO2: 28 meq/L (ref 19–32)
Calcium: 9.5 mg/dL (ref 8.4–10.5)
Chloride: 98 meq/L (ref 96–112)
Creatinine, Ser: 0.58 mg/dL (ref 0.40–1.20)
GFR: 90.35 mL/min (ref >60.00)
Glucose, Bld: 81 mg/dL (ref 70–99)
Potassium: 4.2 meq/L (ref 3.5–5.1)
Sodium: 132 meq/L — ABNORMAL LOW (ref 135–145)
Total Bilirubin: 0.6 mg/dL (ref 0.2–1.2)
Total Protein: 6.4 g/dL (ref 6.0–8.3)

## 2023-11-20 LAB — LDL CHOLESTEROL, DIRECT: Direct LDL: 57 mg/dL

## 2023-11-20 LAB — HEMOGLOBIN A1C: Hgb A1c MFr Bld: 5.3 % (ref 4.6–6.5)

## 2023-11-20 NOTE — Addendum Note (Signed)
Addended by: Warden Fillers on: 11/20/2023 08:38 AM   Modules accepted: Orders

## 2023-11-23 ENCOUNTER — Encounter: Payer: Self-pay | Admitting: Internal Medicine

## 2023-11-24 LAB — INTRINSIC FACTOR ANTIBODIES: Intrinsic Factor: NEGATIVE

## 2023-12-07 ENCOUNTER — Encounter: Payer: Self-pay | Admitting: Internal Medicine

## 2023-12-07 ENCOUNTER — Ambulatory Visit: Payer: PPO | Admitting: Internal Medicine

## 2023-12-07 ENCOUNTER — Other Ambulatory Visit: Payer: Self-pay | Admitting: Internal Medicine

## 2023-12-07 VITALS — BP 134/70 | HR 70 | Ht 67.0 in | Wt 235.2 lb

## 2023-12-07 DIAGNOSIS — Z23 Encounter for immunization: Secondary | ICD-10-CM

## 2023-12-07 DIAGNOSIS — E669 Obesity, unspecified: Secondary | ICD-10-CM | POA: Diagnosis not present

## 2023-12-07 DIAGNOSIS — E785 Hyperlipidemia, unspecified: Secondary | ICD-10-CM

## 2023-12-07 DIAGNOSIS — Z7985 Long-term (current) use of injectable non-insulin antidiabetic drugs: Secondary | ICD-10-CM

## 2023-12-07 DIAGNOSIS — E1169 Type 2 diabetes mellitus with other specified complication: Secondary | ICD-10-CM

## 2023-12-07 DIAGNOSIS — O219 Vomiting of pregnancy, unspecified: Secondary | ICD-10-CM

## 2023-12-07 DIAGNOSIS — R110 Nausea: Secondary | ICD-10-CM

## 2023-12-07 LAB — HEPATIC FUNCTION PANEL
ALT: 14 U/L (ref 0–35)
AST: 18 U/L (ref 0–37)
Albumin: 4.5 g/dL (ref 3.5–5.2)
Alkaline Phosphatase: 76 U/L (ref 39–117)
Bilirubin, Direct: 0.2 mg/dL (ref 0.0–0.3)
Total Bilirubin: 0.6 mg/dL (ref 0.2–1.2)
Total Protein: 7.1 g/dL (ref 6.0–8.3)

## 2023-12-07 LAB — LIPASE: Lipase: 77 U/L — ABNORMAL HIGH (ref 11.0–59.0)

## 2023-12-07 MED ORDER — OMEPRAZOLE 20 MG PO CPDR: DELAYED_RELEASE_CAPSULE | ORAL | 3 refills | Status: DC

## 2023-12-07 MED ORDER — LOSARTAN POTASSIUM 100 MG PO TABS: 100.0000 mg | ORAL_TABLET | Freq: Every day | ORAL | 1 refills | Status: DC

## 2023-12-07 MED ORDER — GABAPENTIN 300 MG PO CAPS: ORAL_CAPSULE | ORAL | 1 refills | Status: DC

## 2023-12-07 MED ORDER — TRIAMTERENE-HCTZ 37.5-25 MG PO TABS: 1.0000 | ORAL_TABLET | Freq: Every day | ORAL | 1 refills | Status: DC

## 2023-12-07 MED ORDER — FUROSEMIDE 20 MG PO TABS: ORAL_TABLET | ORAL | 1 refills | Status: DC

## 2023-12-07 MED ORDER — AMLODIPINE BESYLATE 10 MG PO TABS: 10.0000 mg | ORAL_TABLET | Freq: Every day | ORAL | 3 refills | Status: DC

## 2023-12-07 MED ORDER — MONTELUKAST SODIUM 10 MG PO TABS: 10.0000 mg | ORAL_TABLET | Freq: Every day | ORAL | 3 refills | Status: DC

## 2023-12-07 MED ORDER — PAROXETINE HCL 20 MG PO TABS: 20.0000 mg | ORAL_TABLET | Freq: Every morning | ORAL | 3 refills | Status: AC

## 2023-12-07 NOTE — Assessment & Plan Note (Addendum)
 She has lost 118 lbs since 2021 but gained weight on maximal ozempic  dose .  Encouarged to resume walking for exercise.  Overdue for microalbumin  check and diabetic eye exam .And A1c is now in the normal range    Lab Results  Component Value Date   HGBA1C 5.3 11/20/2023

## 2023-12-07 NOTE — Patient Instructions (Signed)
 PLEASE PLEASE PLEASE GET YOUR EYES AND URINE SCREENS CAUGHT UP   GET BACK TO EXERCISING!    MAKE SURE YOUR FOOD CHOICES HAVE 5 G OF ADDED SUGAR OR LESS

## 2023-12-07 NOTE — Progress Notes (Signed)
 Subjective:  Patient ID: Katie Huber, female    DOB: Apr 04, 1951  Age: 73 y.o. MRN: [id number]  CC: The primary encounter diagnosis was Nausea. Diagnoses of Hyperlipidemia associated with type 2 diabetes mellitus (HCC), Type 2 diabetes mellitus with obesity (HCC), Need for influenza vaccination, and Nausea and vomiting during pregnancy prior to [redacted] weeks gestation were also pertinent to this visit.   HPI [name] presents for  Chief Complaint  Patient presents with   Medical Management of Chronic Issues    6 month follow up    1) Type 2 DM with obesity:  she has gained 21 lb since reaching a nadir  of 208 lbs in September  2023   Despite following a low GI diet  and taking ozempic  2 mg weekly dose.  ..  Regain of 27 lbs despite max dose of ozempic .  Mounjaro  tried but not continued . Has noted relative hyperglycemia for the last 6 weeks,  still < 130  but 30-40 pts higher than previously . Overdue for urine check and eye exam       2) history of recent falls.  Denies vertigo. Attributes to Leg weakness,  stumbles when getting out of car.  history of bilateral knee replacements ,  Lab Results  Component Value Date   ALT 14 12/07/2023   AST 18 12/07/2023   ALKPHOS 76 12/07/2023   BILITOT 0.6 12/07/2023     Outpatient Medications Prior to Visit  Medication Sig Dispense Refill   albuterol  (PROVENTIL ) (2.5 MG/3ML) 0.083% nebulizer solution Take 3 mLs (2.5 mg total) by nebulization every 6 (six) hours as needed for wheezing or shortness of breath. 150 mL 1   albuterol  (VENTOLIN  HFA) 108 (90 Base) MCG/ACT inhaler Inhale 2 puffs into the lungs every 6 (six) hours as needed for wheezing or shortness of breath. 3 each 1   atorvastatin  (LIPITOR) 20 MG tablet Take 1 tablet (20 mg total) by mouth daily. 90 tablet 3   Continuous Glucose Sensor (FREESTYLE LIBRE 2 SENSOR) MISC For diabetes management 6 each 3   cyclobenzaprine  (FLEXERIL ) 10 MG tablet TAKE ONE TABLET BY MOUTH 3 TIMES DAILY  AS NEEDED FOR MUSCLE SPASMS 30 tablet 5   folic acid  (FOLVITE ) 1 MG tablet Take 1 tablet (1 mg total) by mouth daily. 90 tablet 3   Semaglutide , 2 MG/DOSE, (OZEMPIC , 2 MG/DOSE,) 8 MG/3ML SOPN Inject 2 mg into the skin once a week.     zolpidem  (AMBIEN ) 10 MG tablet TAKE ONE TABLET (10 MG) BY MOUTH AT BEDTIME AS NEEDED FOR SLEEP 30 tablet 5   amLODipine  (NORVASC ) 10 MG tablet TAKE 1 TABLET BY MOUTH DAILY 90 tablet 3   furosemide  (LASIX ) 20 MG tablet TAKE ONE TABLET EVERY DAY AS NEEDED FOR FLUID RETENTION 90 tablet 1   gabapentin  (NEURONTIN ) 300 MG capsule TAKE TWO CAPSULES IN THE MORNING, THREE CAPSULES IN THE AFTERNOON, AND THREE CAPSULES IN THE EVENING. 720 capsule 1   losartan  (COZAAR ) 100 MG tablet Take 1 tablet (100 mg total) by mouth daily. 90 tablet 1   montelukast  (SINGULAIR ) 10 MG tablet TAKE 1 TABLET BY MOUTH NIGHTLY 90 tablet 3   omeprazole  (PRILOSEC) 20 MG capsule TAKE 1 CAPSULE BY MOUTH TWICE DAILY 180 capsule 3   PARoxetine  (PAXIL ) 20 MG tablet TAKE 1 TABLET BY MOUTH EVERY MORNING 90 tablet 3   promethazine  (PHENERGAN ) 25 MG tablet TAKE 1 TABLET BY MOUTH EVERY 8 HOURS AS NEEDED FOR NAUSEA AND VOMITING 90 tablet 0  triamterene -hydrochlorothiazide  (MAXZIDE -25) 37.5-25 MG tablet TAKE ONE TABLET BY MOUTH DAILY 90 tablet 1   carvedilol (COREG) 3.125 MG tablet Take 3.125 mg by mouth 2 (two) times daily with a meal.     Continuous Blood Gluc Sensor (FREESTYLE LIBRE 14 DAY SENSOR) MISC For diabetes management 6 each 3   cyanocobalamin  (VITAMIN B12) 1000 MCG/ML injection Inject 1 mL (1,000 mcg total) into the muscle once a week. For 4 weeks,  then monthly thereafter 1 mL 11   mupirocin  ointment (BACTROBAN ) 2 % Apply 1 Application topically 2 (two) times daily. 22 g 0   sulfamethoxazole -trimethoprim  (BACTRIM  DS) 800-160 MG tablet Take 1 tablet by mouth 2 (two) times daily. 20 tablet 0   Syringe/Needle, Disp, (SYRINGE 3CC/25GX1) 25G X 1 3 ML MISC Use for b12 injections 50 each 0    Facility-Administered Medications Prior to Visit  Medication Dose Route Frequency Provider Last Rate Last Admin   pneumococcal 13-valent conjugate vaccine (PREVNAR 13) injection 0.5 mL  0.5 mL Intramuscular Once Felecia Stanfill L, MD        Review of Systems;  Patient denies headache, fevers, malaise, unintentional weight loss, skin rash, eye pain, sinus congestion and sinus pain, sore throat, dysphagia,  hemoptysis , cough, dyspnea, wheezing, chest pain, palpitations, orthopnea, edema, abdominal pain, nausea, melena, diarrhea, constipation, flank pain, dysuria, hematuria, urinary  Frequency, nocturia, numbness, tingling, seizures,  Focal weakness, Loss of consciousness,  Tremor, insomnia, depression, anxiety, and suicidal ideation.      Objective:  BP 134/70   Pulse 70   Ht 5' 7 (1.702 m)   Wt 235 lb 3.2 oz (106.7 kg)   SpO2 96%   BMI 36.84 kg/m   BP Readings from Last 3 Encounters:  12/07/23 134/70  09/22/23 118/68  09/17/23 116/74    Wt Readings from Last 3 Encounters:  12/07/23 235 lb 3.2 oz (106.7 kg)  10/07/23 233 lb 9.6 oz (106 kg)  09/22/23 233 lb 9.6 oz (106 kg)    Physical Exam Vitals reviewed.  Constitutional:      General: She is not in acute distress.    Appearance: Normal appearance. She is normal weight. She is not ill-appearing, toxic-appearing or diaphoretic.  HENT:     Head: Normocephalic.  Eyes:     General: No scleral icterus.       Right eye: No discharge.        Left eye: No discharge.     Conjunctiva/sclera: Conjunctivae normal.  Cardiovascular:     Rate and Rhythm: Normal rate and regular rhythm.     Heart sounds: Normal heart sounds.  Pulmonary:     Effort: Pulmonary effort is normal. No respiratory distress.     Breath sounds: Normal breath sounds.  Musculoskeletal:        General: Normal range of motion.  Skin:    General: Skin is warm and dry.  Neurological:     General: No focal deficit present.     Mental Status: She is alert  and oriented to person, place, and time. Mental status is at baseline.  Psychiatric:        Mood and Affect: Mood normal.        Behavior: Behavior normal.        Thought Content: Thought content normal.        Judgment: Judgment normal.    Lab Results  Component Value Date   HGBA1C 5.3 11/20/2023   HGBA1C 5.1 05/04/2023   HGBA1C 5.3 10/20/2022  Lab Results  Component Value Date   CREATININE 0.58 11/20/2023   CREATININE 0.79 05/04/2023   CREATININE 0.63 10/20/2022    Lab Results  Component Value Date   WBC 4.7 09/22/2023   HGB 12.7 09/22/2023   HCT 37.7 09/22/2023   PLT 210.0 09/22/2023   GLUCOSE 81 11/20/2023   CHOL 137 11/20/2023   TRIG 68.0 11/20/2023   HDL 60.50 11/20/2023   LDLDIRECT 57.0 11/20/2023   LDLCALC 63 11/20/2023   ALT 14 12/07/2023   AST 18 12/07/2023   NA 132 (L) 11/20/2023   K 4.2 11/20/2023   CL 98 11/20/2023   CREATININE 0.58 11/20/2023   BUN 15 11/20/2023   CO2 28 11/20/2023   TSH 0.88 05/04/2023   HGBA1C 5.3 11/20/2023   MICROALBUR 1.1 10/20/2022    No results found.  Assessment & Plan:  .Nausea Assessment & Plan: Checking lipase and hepatic enzymes given use of ozempic  .  Lipase is mildly elevated .  Will suspend ozempic  for a month and repeat lipase  Lab Results  Component Value Date   LIPASE 77.0 (H) 12/07/2023   Lab Results  Component Value Date   ALT 14 12/07/2023   AST 18 12/07/2023   ALKPHOS 76 12/07/2023   BILITOT 0.6 12/07/2023     Orders: -     Lipase -     Hepatic function panel  Hyperlipidemia associated with type 2 diabetes mellitus (HCC) -     Losartan  Potassium; Take 1 tablet (100 mg total) by mouth daily.  Dispense: 90 tablet; Refill: 1  Type 2 diabetes mellitus with obesity (HCC) Assessment & Plan: She has lost 118 lbs since 2021 but gained weight on maximal ozempic  dose .  Encouarged to resume walking for exercise.  Overdue for microalbumin  check and diabetic eye exam .And A1c is now in the normal  range    Lab Results  Component Value Date   HGBA1C 5.3 11/20/2023     Orders: -     Ambulatory referral to Optometry  Need for influenza vaccination -     Flu Vaccine Trivalent High Dose (Fluad)  Nausea and vomiting during pregnancy prior to [redacted] weeks gestation  Other orders -     amLODIPine  Besylate; Take 1 tablet (10 mg total) by mouth daily.  Dispense: 90 tablet; Refill: 3 -     Furosemide ; TAKE ONE TABLET EVERY DAY AS NEEDED FOR FLUID RETENTION  Dispense: 90 tablet; Refill: 1 -     Gabapentin ; TAKE TWO CAPSULES IN THE MORNING, THREE CAPSULES IN THE AFTERNOON, AND THREE CAPSULES IN THE EVENING.  Dispense: 720 capsule; Refill: 1 -     Montelukast  Sodium; Take 1 tablet (10 mg total) by mouth at bedtime.  Dispense: 90 tablet; Refill: 3 -     Omeprazole ; TAKE 1 CAPSULE BY MOUTH TWICE DAILY  Dispense: 180 capsule; Refill: 3 -     PARoxetine  HCl; Take 1 tablet (20 mg total) by mouth every morning.  Dispense: 90 tablet; Refill: 3 -     Triamterene -HCTZ; Take 1 tablet by mouth daily.  Dispense: 90 tablet; Refill: 1     I provided 30 minutes of face-to-face time during this encounter reviewing patient's last visit with me, patient's  most recent visit with cardiology,  nephrology,  and neurology,  recent surgical and non surgical procedures, previous  labs and imaging studies, counseling on currently addressed issues,  and post visit ordering to diagnostics and therapeutics .   Follow-up: Return in about  3 months (around 03/06/2024).   Verneita LITTIE Kettering, MD

## 2023-12-07 NOTE — Assessment & Plan Note (Signed)
 Checking lipase and hepatic enzymes given use of ozempic  .  Lipase is mildly elevated .  Will suspend ozempic  for a month and repeat lipase  Lab Results  Component Value Date   LIPASE 77.0 (H) 12/07/2023   Lab Results  Component Value Date   ALT 14 12/07/2023   AST 18 12/07/2023   ALKPHOS 76 12/07/2023   BILITOT 0.6 12/07/2023

## 2023-12-08 ENCOUNTER — Other Ambulatory Visit: Payer: Self-pay

## 2023-12-08 DIAGNOSIS — R748 Abnormal levels of other serum enzymes: Secondary | ICD-10-CM

## 2023-12-16 ENCOUNTER — Telehealth: Payer: Self-pay

## 2023-12-16 NOTE — Telephone Encounter (Signed)
 Copied from CRM [id number]. Topic: Clinical - Prescription Issue >> Dec 16, 2023  2:24 PM Lovett Ruck C wrote: Reason for CRM: request for prior authorization for medications. [facility] is in need of various things regarding patient such as patient's diagnosis and dates of care. Please call [phone] option 2

## 2023-12-17 LAB — MICROALBUMIN / CREATININE URINE RATIO
Creatinine,U: 57.8 mg/dL
Microalb Creat Ratio: 1.2 mg/g (ref 0.0–30.0)
Microalb, Ur: <0.7 mg/dL (ref 0.0–1.9)

## 2023-12-21 ENCOUNTER — Ambulatory Visit: Payer: PPO | Admitting: Internal Medicine

## 2023-12-21 ENCOUNTER — Encounter: Payer: Self-pay | Admitting: Internal Medicine

## 2023-12-21 VITALS — BP 130/65 | HR 64 | Temp 98.1°F | Resp 16 | Ht 67.0 in | Wt 246.2 lb

## 2023-12-21 DIAGNOSIS — G4733 Obstructive sleep apnea (adult) (pediatric): Secondary | ICD-10-CM

## 2023-12-21 DIAGNOSIS — J4520 Mild intermittent asthma, uncomplicated: Secondary | ICD-10-CM | POA: Diagnosis not present

## 2023-12-21 NOTE — Patient Instructions (Signed)
 Asthma, Adult  Asthma is a condition that causes swelling and narrowing of the airways. These are the passages that lead from the nose and mouth down into the lungs. When asthma symptoms get worse it is called an asthma attack or flare. This can make it hard to breathe. Asthma flares can range from minor to life-threatening. There is no cure for asthma, but medicines and lifestyle changes can help to control it. What are the causes? It is not known exactly what causes asthma, but certain things can cause asthma symptoms to get worse (triggers). What can trigger an asthma attack? Cigarette smoke. Mold. Dust. Your pet's skin flakes (dander). Cockroaches. Pollen. Air pollution (like household cleaners, wood smoke, smog, or Therapist, occupational). What are the signs or symptoms? Trouble breathing (shortness of breath). Coughing. Making high-pitched whistling sounds when you breathe, most often when you breathe out (wheezing). Chest tightness. Tiredness with little activity. Poor exercise tolerance. How is this treated? Controller medicines that help prevent asthma symptoms. Fast-acting reliever or rescue medicines. These give short-term relief of asthma symptoms. Allergy medicines if your attacks are brought on by allergens. Medicines to help control the body's defense (immune) system. Staying away from the things that cause asthma attacks. Follow these instructions at home: Avoiding triggers in your home Do not allow anyone to smoke in your home. Limit use of fireplaces and wood stoves. Get rid of pests (such as roaches and mice) and their droppings. Keep your home clean. Clean your floors. Dust regularly. Use cleaning products that do not smell. Wash bed sheets and blankets every week in hot water. Dry them in a dryer. Have someone vacuum when you are not home. Change your heating and air conditioning filters often. Use blankets that are made of polyester or cotton. General  instructions Take over-the-counter and prescription medicines only as told by your doctor. Do not smoke or use any products that contain nicotine or tobacco. If you need help quitting, ask your doctor. Stay away from secondhand smoke. Avoid doing things outdoors when allergen counts are high and when air quality is low. Warm up before you exercise. Take time to cool down after exercise. Use a peak flow meter as told by your doctor. A peak flow meter is a tool that measures how well your lungs are working. Keep track of the peak flow meter's readings. Write them down. Follow your asthma action plan. This is a written plan for taking care of your asthma and treating your attacks. Make sure you get all the shots (vaccines) that your doctor recommends. Ask your doctor about a flu shot and a pneumonia shot. Keep all follow-up visits. Contact a doctor if: You have wheezing, shortness of breath, or a cough even while taking medicine to prevent attacks. The mucus you cough up (sputum) is thicker than usual. The mucus you cough up changes from clear or white to yellow, green, gray, or is bloody. You have problems from the medicine you are taking, such as: A rash. Itching. Swelling. Trouble breathing. You need reliever medicines more than 2-3 times a week. Your peak flow reading is still at 50-79% of your personal best after following the action plan for 1 hour. You have a fever. Get help right away if: You seem to be worse and are not responding to medicine during an asthma attack. You are short of breath even at rest. You get short of breath when doing very little activity. You have trouble eating, drinking, or talking. You have chest  pain or tightness. You have a fast heartbeat. Your lips or fingernails start to turn blue. You are light-headed or dizzy, or you faint. Your peak flow is less than 50% of your personal best. You feel too tired to breathe normally. These symptoms may be an  emergency. Get help right away. Call 911. Do not wait to see if the symptoms will go away. Do not drive yourself to the hospital. Summary Asthma is a long-term (chronic) condition in which the airways get tight and narrow. An asthma attack can make it hard to breathe. Asthma cannot be cured, but medicines and lifestyle changes can help control it. Make sure you understand how to avoid triggers and how and when to use your medicines. Avoid things that can cause allergy symptoms (allergens). These include animal skin flakes (dander) and pollen from trees or grass. Avoid things that pollute the air. These may include household cleaners, wood smoke, smog, or chemical odors. This information is not intended to replace advice given to you by your health care provider. Make sure you discuss any questions you have with your health care provider. Document Revised: 08/26/2021 Document Reviewed: 08/26/2021 Elsevier Patient Education  2024 [employer].

## 2023-12-21 NOTE — Progress Notes (Signed)
[facility] [address] [place], [place] [address]  Pulmonary Sleep Medicine   Office Visit Note  Patient Name: Katie Huber DOB: 04-Nov-1951 MRN [id number]  Date of Service: 12/21/2023  Complaints/HPI: She is doing well. She staets she has not had any cough or congestion. She continues to maintain her weight. She has been having difficulty with using her CPAP. She had an AHI of 15 but is not able to tolerate. She and I spoke about positional sleep as her AHI was worse during supine sleep  Office Spirometry Results:     ROS  General: (-) fever, (-) chills, (-) night sweats, (-) weakness Skin: (-) rashes, (-) itching,. Eyes: (-) visual changes, (-) redness, (-) itching. Nose and Sinuses: (-) nasal stuffiness or itchiness, (-) postnasal drip, (-) nosebleeds, (-) sinus trouble. Mouth and Throat: (-) sore throat, (-) hoarseness. Neck: (-) swollen glands, (-) enlarged thyroid, (-) neck pain. Respiratory: - cough, (-) bloody sputum, - shortness of breath, - wheezing. Cardiovascular: - ankle swelling, (-) chest pain. Lymphatic: (-) lymph node enlargement. Neurologic: (-) numbness, (-) tingling. Psychiatric: (-) anxiety, (-) depression   Current Medication: Outpatient Encounter Medications as of 12/21/2023  Medication Sig   albuterol (PROVENTIL) (2.5 MG/3ML) 0.083% nebulizer solution Take 3 mLs (2.5 mg total) by nebulization every 6 (six) hours as needed for wheezing or shortness of breath.   albuterol (VENTOLIN HFA) 108 (90 Base) MCG/ACT inhaler Inhale 2 puffs into the lungs every 6 (six) hours as needed for wheezing or shortness of breath.   amLODipine (NORVASC) 10 MG tablet Take 1 tablet (10 mg total) by mouth daily.   atorvastatin (LIPITOR) 20 MG tablet Take 1 tablet (20 mg total) by mouth daily.   Continuous Glucose Sensor (FREESTYLE LIBRE 2 SENSOR) MISC For diabetes management   cyclobenzaprine (FLEXERIL) 10 MG tablet TAKE ONE TABLET BY MOUTH 3 TIMES DAILY AS  NEEDED FOR MUSCLE SPASMS   folic acid (FOLVITE) 1 MG tablet Take 1 tablet (1 mg total) by mouth daily.   furosemide (LASIX) 20 MG tablet TAKE ONE TABLET EVERY DAY AS NEEDED FOR FLUID RETENTION   gabapentin (NEURONTIN) 300 MG capsule TAKE TWO CAPSULES IN THE MORNING, THREE CAPSULES IN THE AFTERNOON, AND THREE CAPSULES IN THE EVENING.   losartan (COZAAR) 100 MG tablet Take 1 tablet (100 mg total) by mouth daily.   montelukast (SINGULAIR) 10 MG tablet Take 1 tablet (10 mg total) by mouth at bedtime.   omeprazole (PRILOSEC) 20 MG capsule TAKE 1 CAPSULE BY MOUTH TWICE DAILY   PARoxetine (PAXIL) 20 MG tablet Take 1 tablet (20 mg total) by mouth every morning.   promethazine (PHENERGAN) 25 MG tablet TAKE 1 TABLET BY MOUTH EVERY 8 HOURS AS NEEDED FOR NAUSEA AND VOMITING   Semaglutide, 2 MG/DOSE, (OZEMPIC, 2 MG/DOSE,) 8 MG/3ML SOPN Inject 2 mg into the skin once a week.   triamterene-hydrochlorothiazide (MAXZIDE-25) 37.5-25 MG tablet Take 1 tablet by mouth daily.   zolpidem (AMBIEN) 10 MG tablet TAKE ONE TABLET (10 MG) BY MOUTH AT BEDTIME AS NEEDED FOR SLEEP   Facility-Administered Encounter Medications as of 12/21/2023  Medication   pneumococcal 13-valent conjugate vaccine (PREVNAR 13) injection 0.5 mL    Surgical History: Past Surgical History:  Procedure Laterality Date   ABDOMINAL HYSTERECTOMY  2001   BREAST BIOPSY Right    Neg   BREAST EXCISIONAL BIOPSY Right 1990s   multiple hematomas   CHOLECYSTECTOMY  1970s   COLONOSCOPY WITH PROPOFOL N/A 03/30/2018   Procedure: COLONOSCOPY WITH  PROPOFOL;  Surgeon: Midge Minium, MD;  Location: [facility] ENDOSCOPY;  Service: Endoscopy;  Laterality: N/A;   COLONOSCOPY WITH PROPOFOL N/A 07/14/2023   Procedure: COLONOSCOPY WITH PROPOFOL;  Surgeon: Midge Minium, MD;  Location: [facility] ENDOSCOPY;  Service: Endoscopy;  Laterality: N/A;   HOT HEMOSTASIS  07/14/2023   Procedure: HOT HEMOSTASIS (ARGON PLASMA COAGULATION/BICAP);  Surgeon: Midge Minium, MD;  Location: [facility]  ENDOSCOPY;  Service: Endoscopy;;   JOINT REPLACEMENT     Bilateral   POLYPECTOMY  07/14/2023   Procedure: POLYPECTOMY;  Surgeon: Midge Minium, MD;  Location: [facility] ENDOSCOPY;  Service: Endoscopy;;   STOMACH SURGERY  [phone]   gastric partitionin( for obesity)     Medical History: Past Medical History:  Diagnosis Date   Asthma 1990   Chronic respiratory failure with hypoxia (HCC) 12/11/2017   Hypertension    Obesity, Class III, BMI 40-49.9 (morbid obesity) (HCC)    weight fluctuations of 100 lbs     Family History: Family History  Problem Relation Age of Onset   Heart disease Father    COPD Father    Hyperlipidemia Father    Mental illness Brother    Breast cancer Neg Hx     Social History: Social History   Socioeconomic History   Marital status: Single    Spouse name: Not on file   Number of children: Not on file   Years of education: Not on file   Highest education level: Bachelor's degree (e.g., BA, AB, BS)  Occupational History   Not on file  Tobacco Use   Smoking status: Never   Smokeless tobacco: Never  Vaping Use   Vaping status: Never Used  Substance and Sexual Activity   Alcohol use: Yes    Comment: ocassionally   Drug use: No   Sexual activity: Never  Other Topics Concern   Not on file  Social History Narrative   Not on file   Social Drivers of Health   Financial Resource Strain: Low Risk  (12/03/2023)   Overall Financial Resource Strain (CARDIA)    Difficulty of Paying Living Expenses: Not very hard  Food Insecurity: No Food Insecurity (12/03/2023)   Hunger Vital Sign    Worried About Running Out of Food in the Last Year: Never true    Ran Out of Food in the Last Year: Never true  Transportation Needs: No Transportation Needs (12/03/2023)   PRAPARE - Administrator, Civil Service (Medical): No    Lack of Transportation (Non-Medical): No  Physical Activity: Insufficiently Active (12/03/2023)   Exercise Vital Sign    Days of Exercise per  Week: 3 days    Minutes of Exercise per Session: 20 min  Stress: No Stress Concern Present (12/03/2023)   [employer] of Occupational Health - Occupational Stress Questionnaire    Feeling of Stress : Not at all  Social Connections: Moderately Integrated (12/03/2023)   Social Connection and Isolation Panel [NHANES]    Frequency of Communication with Friends and Family: More than three times a week    Frequency of Social Gatherings with Friends and Family: More than three times a week    Attends Religious Services: More than 4 times per year    Active Member of [employer] or Organizations: Yes    Attends Banker Meetings: Never    Marital Status: Divorced  Catering manager Violence: Not At Risk (03/06/2023)   Humiliation, Afraid, Rape, and Kick questionnaire    Fear of Current or Ex-Partner: No  Emotionally Abused: No    Physically Abused: No    Sexually Abused: No    Vital Signs: Blood pressure 130/65, pulse 64, temperature 98.1 F (36.7 C), resp. rate 16, height 5\' 7"  (1.702 m), weight 246 lb 3.2 oz (111.7 kg), SpO2 98%.  Examination: General Appearance: The patient is well-developed, well-nourished, and in no distress. Skin: Gross inspection of skin unremarkable. Head: normocephalic, no gross deformities. Eyes: no gross deformities noted. ENT: ears appear grossly normal no exudates. Neck: Supple. No thyromegaly. No LAD. Respiratory: no rhonchi noted. Cardiovascular: Normal S1 and S2 without murmur or rub. Extremities: No cyanosis. pulses are equal. Neurologic: Alert and oriented. No involuntary movements.  LABS: Recent Results (from the past 2160 hours)  WOUND CULTURE     Status: None   Collection Time: 09/22/23  3:31 PM   Specimen: Wound  Result Value Ref Range   MICRO NUMBER: [id number]    SPECIMEN QUALITY: Adequate    SOURCE: NOT GIVEN    STATUS: FINAL    GRAM STAIN:      No organisms or white blood cells seen No epithelial cells seen   RESULT: No Growth     COMMENT:      No source was provided. The specimen was tested and reported based upon the test code ordered. If this is incorrect, please contact client services.  CBC w/Diff     Status: None   Collection Time: 09/22/23  3:39 PM  Result Value Ref Range   WBC 4.7 4.0 - 10.5 K/uL   RBC 3.96 3.87 - 5.11 Mil/uL   Hemoglobin 12.7 12.0 - 15.0 g/dL   HCT 54.0 98.1 - 19.1 %   MCV 95.1 78.0 - 100.0 fl   MCHC 33.6 30.0 - 36.0 g/dL   RDW 47.8 29.5 - 62.1 %   Platelets 210.0 150.0 - 400.0 K/uL   Neutrophils Relative % 51.7 43.0 - 77.0 %   Lymphocytes Relative 33.9 12.0 - 46.0 %   Monocytes Relative 10.7 3.0 - 12.0 %   Eosinophils Relative 2.5 0.0 - 5.0 %   Basophils Relative 1.2 0.0 - 3.0 %   Neutro Abs 2.4 1.4 - 7.7 K/uL   Lymphs Abs 1.6 0.7 - 4.0 K/uL   Monocytes Absolute 0.5 0.1 - 1.0 K/uL   Eosinophils Absolute 0.1 0.0 - 0.7 K/uL   Basophils Absolute 0.1 0.0 - 0.1 K/uL  Hemoglobin A1c     Status: None   Collection Time: 11/20/23  8:33 AM  Result Value Ref Range   Hgb A1c MFr Bld 5.3 4.6 - 6.5 %    Comment: Glycemic Control Guidelines for People with Diabetes:Non Diabetic:  <6%Goal of Therapy: <7%Additional Action Suggested:  >8%   Comprehensive metabolic panel     Status: Abnormal   Collection Time: 11/20/23  8:33 AM  Result Value Ref Range   Sodium 132 (L) 135 - 145 mEq/L   Potassium 4.2 3.5 - 5.1 mEq/L   Chloride 98 96 - 112 mEq/L   CO2 28 19 - 32 mEq/L   Glucose, Bld 81 70 - 99 mg/dL   BUN 15 6 - 23 mg/dL   Creatinine, Ser 3.08 0.40 - 1.20 mg/dL   Total Bilirubin 0.6 0.2 - 1.2 mg/dL   Alkaline Phosphatase 110 39 - 117 U/L   AST 64 (H) 0 - 37 U/L   ALT 98 (H) 0 - 35 U/L   Total Protein 6.4 6.0 - 8.3 g/dL   Albumin 4.3 3.5 - 5.2 g/dL  GFR 90.35 >60.00 mL/min    Comment: Calculated using the CKD-EPI Creatinine Equation (2021)   Calcium 9.5 8.4 - 10.5 mg/dL  LDL cholesterol, direct     Status: None   Collection Time: 11/20/23  8:33 AM  Result Value Ref Range   Direct LDL  57.0 mg/dL    Comment: Optimal:  <161 mg/dLNear or Above Optimal:  100-129 mg/dLBorderline High:  130-159 mg/dLHigh:  160-189 mg/dLVery High:  >190 mg/dL  Lipid Profile     Status: None   Collection Time: 11/20/23  8:33 AM  Result Value Ref Range   Cholesterol 137 0 - 200 mg/dL    Comment: ATP III Classification       Desirable:  < 200 mg/dL               Borderline High:  200 - 239 mg/dL          High:  > = 096 mg/dL   Triglycerides 04.5 0.0 - 149.0 mg/dL    Comment: Normal:  <409 mg/dLBorderline High:  150 - 199 mg/dL   HDL 81.19 >14.78 mg/dL   VLDL 29.5 0.0 - 62.1 mg/dL   LDL Cholesterol 63 0 - 99 mg/dL   Total CHOL/HDL Ratio 2     Comment:                Men          Women1/2 Average Risk     3.4          3.3Average Risk          5.0          4.42X Average Risk          9.6          7.13X Average Risk          15.0          11.0                       NonHDL 76.12     Comment: NOTE:  Non-HDL goal should be 30 mg/dL higher than patient's LDL goal (i.e. LDL goal of < 70 mg/dL, would have non-HDL goal of < 100 mg/dL)  Intrinsic Factor Antibodies     Status: None   Collection Time: 11/20/23  8:33 AM  Result Value Ref Range   Intrinsic Factor Negative Negative    Comment: . For additional information, please refer to http://education.questdiagnostics.com/faq/IFAB (This link is being provided for informational/  educational purposes only.) .   Lipase     Status: Abnormal   Collection Time: 12/07/23  1:48 PM  Result Value Ref Range   Lipase 77.0 (H) 11.0 - 59.0 U/L  Hepatic function panel     Status: None   Collection Time: 12/07/23  1:48 PM  Result Value Ref Range   Total Bilirubin 0.6 0.2 - 1.2 mg/dL   Bilirubin, Direct 0.2 0.0 - 0.3 mg/dL   Alkaline Phosphatase 76 39 - 117 U/L   AST 18 0 - 37 U/L   ALT 14 0 - 35 U/L   Total Protein 7.1 6.0 - 8.3 g/dL   Albumin 4.5 3.5 - 5.2 g/dL  Microalbumin / creatinine urine ratio     Status: None   Collection Time: 12/16/23  4:57 PM   Result Value Ref Range   Microalb, Ur <0.7 0.0 - 1.9 mg/dL   Creatinine,U 30.8 mg/dL   Microalb Creat Ratio 1.2 0.0 - 30.0 mg/g  Radiology: No results found.  No results found.  No results found.  Assessment and Plan: Patient Active Problem List   Diagnosis Date Noted   Nausea 12/07/2023   Bullae 09/23/2023   Skin lesion of foot 09/23/2023   Long-term current use of injectable noninsulin antidiabetic medication 05/05/2023   Anemia 05/05/2023   Body aches 08/22/2022   Hepatic steatosis 04/20/2022   Insomnia 04/20/2022   Major depressive disorder, recurrent episode with anxious distress (HCC) 06/11/2021   Onychomycosis of right great toe 03/04/2021   Coronary artery calcification of native artery 11/10/2020   Abdominal aortic atherosclerosis (HCC) 11/10/2020   Oral cavity pain 01/09/2020   Vitamin D deficiency 01/09/2020   Diarrhea, functional 07/26/2018   Tubular adenoma of colon 04/01/2018   Benign neoplasm of cecum    Encounter for screening colonoscopy    Rectal polyp    B12 deficiency 01/26/2018   Hospital discharge follow-up 03/14/2017   Mild intermittent asthma 03/04/2017   Herpes simplex infection 08/16/2015   Skin neoplasm 08/16/2015   Pulmonary hypertension (HCC) 02/06/2015   S/P vaginal hysterectomy 06/13/2014   Encounter for preventive health examination 06/13/2014   Essential hypertension, benign 12/27/2013   S/P bariatric surgery 09/20/2013   Insomnia due to anxiety and fear 06/14/2013   Type 2 diabetes mellitus with obesity (HCC) 03/02/2013   Hyperlipidemia associated with type 2 diabetes mellitus (HCC) 12/17/2011   Encounter for long-term (current) use of other medications 12/17/2011   Cervicalgia 12/16/2011   Sleep apnea 12/16/2011    1. OSA (obstructive sleep apnea) (Primary) Cannot tolerate CPAP. She has no symptoms prefers side sleep and her AHI is less on the side vs supine. Work on weight loss  2. Mild intermittent asthma without  complication Under control no exacerbations  3. Morbid obesity (HCC) Obesity Counseling: Had a lengthy discussion regarding patients BMI and weight issues. Patient was instructed on portion control as well as increased activity.   General Counseling: I have discussed the findings of the evaluation and examination with Mekhia.  I have also discussed any further diagnostic evaluation thatmay be needed or ordered today. Emersen verbalizes understanding of the findings of todays visit. We also reviewed her medications today and discussed drug interactions and side effects including but not limited excessive drowsiness and altered mental states. We also discussed that there is always a risk not just to her but also people around her. she has been encouraged to call the office with any questions or concerns that should arise related to todays visit.  No orders of the defined types were placed in this encounter.    Time spent: 41  I have personally obtained a history, examined the patient, evaluated laboratory and imaging results, formulated the assessment and plan and placed orders.    Yevonne Pax, MD [facility] Pulmonary and Critical Care Sleep medicine

## 2023-12-22 ENCOUNTER — Telehealth: Payer: Self-pay

## 2023-12-22 ENCOUNTER — Encounter: Payer: Self-pay | Admitting: Internal Medicine

## 2023-12-22 ENCOUNTER — Other Ambulatory Visit (HOSPITAL_COMMUNITY): Payer: Self-pay

## 2023-12-22 DIAGNOSIS — M62830 Muscle spasm of back: Secondary | ICD-10-CM

## 2023-12-22 NOTE — Telephone Encounter (Signed)
NOTED

## 2023-12-22 NOTE — Telephone Encounter (Signed)
Pharmacy Patient Advocate Encounter   Received notification from Pt Calls Messages that prior authorization for Cyclobenzaprine 10 mg tablets is required/requested.   Insurance verification completed.   The patient is insured through [facility] ADVANTAGE/RX ADVANCE .   Per test claim: PA required; PA submitted to above mentioned insurance via Phone Key/confirmation #/EOC [id number] Status is pending

## 2023-12-22 NOTE — Telephone Encounter (Signed)
PA has been denied.  

## 2023-12-23 MED ORDER — TIZANIDINE HCL 4 MG PO TABS: 4.0000 mg | ORAL_TABLET | Freq: Four times a day (QID) | ORAL | 0 refills | Status: DC | PRN

## 2023-12-25 ENCOUNTER — Other Ambulatory Visit (INDEPENDENT_AMBULATORY_CARE_PROVIDER_SITE_OTHER): Payer: PPO

## 2023-12-25 DIAGNOSIS — R748 Abnormal levels of other serum enzymes: Secondary | ICD-10-CM

## 2023-12-25 LAB — LIPASE: Lipase: 42 U/L (ref 11.0–59.0)

## 2023-12-26 ENCOUNTER — Encounter: Payer: Self-pay | Admitting: Internal Medicine

## 2024-01-04 ENCOUNTER — Other Ambulatory Visit: Payer: Self-pay | Admitting: Internal Medicine

## 2024-01-04 MED ORDER — FREESTYLE LIBRE 2 SENSOR MISC: 3 refills | Status: AC

## 2024-01-04 NOTE — Telephone Encounter (Signed)
Copied from CRM [id number]. Topic: Clinical - Medication Refill >> Jan 04, 2024  1:08 PM Orinda Kenner C wrote: Most Recent Primary Care Visit:  Provider: [facility]  Department: [facility]  Visit Type: LAB VISIT  Date: 12/25/2023  Medication: Continuous Glucose Sensor (FREESTYLE LIBRE 2 SENSOR) MISC   Has the patient contacted their pharmacy? No (Agent: If no, request that the patient contact the pharmacy for the refill. If patient does not wish to contact the pharmacy document the reason why and proceed with request.) (Agent: If yes, when and what did the pharmacy advise?)  Is this the correct pharmacy for this prescription? No If no, delete pharmacy and type the correct one.   Patient would like to speak with Shanda Bumps, she states Shanda Bumps knows where to send prescription because it get mailed to her house.   Has the prescription been filled recently? No  Is the patient out of the medication? Yes, currently wearing the last [facility] and does not have anymore.    Has the patient been seen for an appointment in the last year OR does the patient have an upcoming appointment? Yes  Can we respond through MyChart? Yes  Agent: Please be advised that Rx refills may take up to 3 business days. We ask that you follow-up with your pharmacy.

## 2024-01-13 ENCOUNTER — Other Ambulatory Visit: Payer: Self-pay | Admitting: Internal Medicine

## 2024-01-20 DIAGNOSIS — H2513 Age-related nuclear cataract, bilateral: Secondary | ICD-10-CM | POA: Diagnosis not present

## 2024-01-20 DIAGNOSIS — H5203 Hypermetropia, bilateral: Secondary | ICD-10-CM | POA: Diagnosis not present

## 2024-01-20 DIAGNOSIS — H35033 Hypertensive retinopathy, bilateral: Secondary | ICD-10-CM | POA: Diagnosis not present

## 2024-01-25 ENCOUNTER — Other Ambulatory Visit: Payer: Self-pay | Admitting: Internal Medicine

## 2024-01-25 DIAGNOSIS — E538 Deficiency of other specified B group vitamins: Secondary | ICD-10-CM

## 2024-02-15 ENCOUNTER — Other Ambulatory Visit: Payer: Self-pay | Admitting: Internal Medicine

## 2024-02-17 ENCOUNTER — Other Ambulatory Visit: Payer: Self-pay | Admitting: Internal Medicine

## 2024-02-18 ENCOUNTER — Telehealth: Payer: Self-pay

## 2024-02-18 NOTE — Telephone Encounter (Signed)
 LMTCB. Please let pt know when she returns call that we have received her patient assistance medication at the office and it is ready for pick up.    Ozempic 2 mg: 4 boxes

## 2024-02-19 ENCOUNTER — Telehealth: Payer: Self-pay

## 2024-02-19 NOTE — Telephone Encounter (Signed)
 When I spoke with pt to let her know of the directions below. Pt let me know that she is not on the 200 mg capsule she is taking the 300 mg capsule. So morning dose is 600 mg, 2 pm dose is 900 mg and bedtime dose in 900 mg. Can pt increase the bedtime dose any higher? Pt stated that you can send her the directions in her mychart. If an increase can be done she will need a new prescription.

## 2024-02-19 NOTE — Telephone Encounter (Signed)
 Pt came into office @ 12:10 to pick up pt assistance medication Ozempic 4 boxes

## 2024-02-19 NOTE — Telephone Encounter (Signed)
 Mychart message has been sent.

## 2024-02-19 NOTE — Telephone Encounter (Signed)
 Copied from CRM [id number]. Topic: General - Other >> Feb 18, 2024  4:57 PM Florestine Avers wrote: Reason for CRM: Patient states she wants to restart the ozempic and will be picking up the medication from the office. The patient also wants to increase her gabapentin dosage. Patient states that its not letting her sleep through the night. Patient is also asking for a call back to discuss the increase of her Gabapentin.

## 2024-02-19 NOTE — Telephone Encounter (Signed)
 Called pt to see how long she has been off of the Ozempic. Pt stated that she has been off of it since December 2024 due to elevated LFTs. Pt stated that 2 weeks later the LFTs were rechecked and had returned to normal. The lipase had returned to normal in January and at that time she was advised to let you know when she wanted to start back on the Ozempic. Pt stated that she is ready to start back on and will come pick up her Ozempic from the office. However is it okay for her to start back on the 2 mg dose?   Also pt would like to know if she could increase her Gabapentin dose. Pt is currently on the 200 mg tablets. She is taking 400 mg in the morning, around 2 pm she is taking 600 mg and at bedtime she is taking 600 mg. Pt stated that she is having trouble sleeping due to the hand and arm being achy and numb during the night.

## 2024-02-24 ENCOUNTER — Other Ambulatory Visit: Payer: Self-pay | Admitting: Internal Medicine

## 2024-02-24 DIAGNOSIS — G4700 Insomnia, unspecified: Secondary | ICD-10-CM

## 2024-03-07 ENCOUNTER — Other Ambulatory Visit: Payer: Self-pay | Admitting: Internal Medicine

## 2024-03-07 DIAGNOSIS — E1169 Type 2 diabetes mellitus with other specified complication: Secondary | ICD-10-CM

## 2024-03-14 ENCOUNTER — Other Ambulatory Visit: Payer: Self-pay | Admitting: Internal Medicine

## 2024-03-14 ENCOUNTER — Encounter: Payer: Self-pay | Admitting: Internal Medicine

## 2024-03-23 ENCOUNTER — Ambulatory Visit: Admitting: Internal Medicine

## 2024-04-05 ENCOUNTER — Other Ambulatory Visit: Payer: Self-pay | Admitting: Internal Medicine

## 2024-04-06 ENCOUNTER — Ambulatory Visit: Admitting: Internal Medicine

## 2024-04-07 ENCOUNTER — Ambulatory Visit: Admitting: Nurse Practitioner

## 2024-05-02 ENCOUNTER — Other Ambulatory Visit: Payer: Self-pay | Admitting: Internal Medicine

## 2024-05-10 ENCOUNTER — Ambulatory Visit (INDEPENDENT_AMBULATORY_CARE_PROVIDER_SITE_OTHER): Admitting: Internal Medicine

## 2024-05-10 ENCOUNTER — Encounter: Payer: Self-pay | Admitting: Internal Medicine

## 2024-05-10 ENCOUNTER — Ambulatory Visit: Payer: Self-pay | Admitting: Internal Medicine

## 2024-05-10 VITALS — BP 128/78 | HR 66 | Ht 67.0 in | Wt 243.2 lb

## 2024-05-10 DIAGNOSIS — E1169 Type 2 diabetes mellitus with other specified complication: Secondary | ICD-10-CM | POA: Diagnosis not present

## 2024-05-10 DIAGNOSIS — R5383 Other fatigue: Secondary | ICD-10-CM | POA: Diagnosis not present

## 2024-05-10 DIAGNOSIS — G959 Disease of spinal cord, unspecified: Secondary | ICD-10-CM | POA: Diagnosis not present

## 2024-05-10 DIAGNOSIS — E669 Obesity, unspecified: Secondary | ICD-10-CM

## 2024-05-10 DIAGNOSIS — K760 Fatty (change of) liver, not elsewhere classified: Secondary | ICD-10-CM

## 2024-05-10 DIAGNOSIS — M5000 Cervical disc disorder with myelopathy, unspecified cervical region: Secondary | ICD-10-CM | POA: Diagnosis not present

## 2024-05-10 DIAGNOSIS — D649 Anemia, unspecified: Secondary | ICD-10-CM | POA: Diagnosis not present

## 2024-05-10 DIAGNOSIS — I10 Essential (primary) hypertension: Secondary | ICD-10-CM

## 2024-05-10 DIAGNOSIS — Z7985 Long-term (current) use of injectable non-insulin antidiabetic drugs: Secondary | ICD-10-CM

## 2024-05-10 DIAGNOSIS — E785 Hyperlipidemia, unspecified: Secondary | ICD-10-CM

## 2024-05-10 MED ORDER — FUROSEMIDE 20 MG PO TABS: ORAL_TABLET | ORAL | 1 refills | Status: DC

## 2024-05-10 MED ORDER — TRIAMTERENE-HCTZ 37.5-25 MG PO TABS: 1.0000 | ORAL_TABLET | Freq: Every day | ORAL | 1 refills | Status: DC

## 2024-05-10 MED ORDER — PREDNISONE 10 MG PO TABS: ORAL_TABLET | ORAL | 0 refills | Status: DC

## 2024-05-10 MED ORDER — LOSARTAN POTASSIUM 100 MG PO TABS: 100.0000 mg | ORAL_TABLET | Freq: Every day | ORAL | 1 refills | Status: DC

## 2024-05-10 NOTE — Patient Instructions (Signed)
 I RECOMMEND THAT YOU RESUME OZEMPIC  AND GET THIS WEIGHT OFF FOR YOUR LIVER'S SAKE   OZEMPIC  PENS CAN BE MANIPULATED INTO DELIVERING LOWER DOSES BY "COUNTING CLICKS"   IF YOU ARE USING THE 8 MG PEN (GOLD COLOR) THAT DELIVERS THE 2 MG WEEKLY DOSE  10 CLICKS   0.25 MG DOSE 19 CLICK     0.50 MG DOSE 37 CLICKS   1.0 MG DOSE 56 CLICKS   1.5 MG DOSE    THE 4 MG PEN  (TEAL( THAT DELIVERS THE 1 MG WEEKLY DOSE:  19 CLICKS    0.25 MG DOSE 37 CLICKS    0.50 MG DOSE
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
## 2024-05-11 LAB — COMPREHENSIVE METABOLIC PANEL WITH GFR
ALT: 18 U/L (ref 0–35)
AST: 21 U/L (ref 0–37)
Albumin: 4.4 g/dL (ref 3.5–5.2)
Alkaline Phosphatase: 67 U/L (ref 39–117)
BUN: 15 mg/dL (ref 6–23)
CO2: 28 meq/L (ref 19–32)
Calcium: 10 mg/dL (ref 8.4–10.5)
Chloride: 100 meq/L (ref 96–112)
Creatinine, Ser: 0.64 mg/dL (ref 0.40–1.20)
GFR: 87.94 mL/min (ref >60.00)
Glucose, Bld: 94 mg/dL (ref 70–99)
Potassium: 4.2 meq/L (ref 3.5–5.1)
Sodium: 135 meq/L (ref 135–145)
Total Bilirubin: 0.5 mg/dL (ref 0.2–1.2)
Total Protein: 7.2 g/dL (ref 6.0–8.3)

## 2024-05-11 LAB — CBC WITH DIFFERENTIAL/PLATELET
Basophils Absolute: 0 10*3/uL (ref 0.0–0.1)
Basophils Relative: 1 % (ref 0.0–3.0)
Eosinophils Absolute: 0.2 10*3/uL (ref 0.0–0.7)
Eosinophils Relative: 3.3 % (ref 0.0–5.0)
HCT: 38.5 % (ref 36.0–46.0)
Hemoglobin: 13.1 g/dL (ref 12.0–15.0)
Lymphocytes Relative: 35.9 % (ref 12.0–46.0)
Lymphs Abs: 1.7 10*3/uL (ref 0.7–4.0)
MCHC: 34.1 g/dL (ref 30.0–36.0)
MCV: 94.7 fl (ref 78.0–100.0)
Monocytes Absolute: 0.4 10*3/uL (ref 0.1–1.0)
Monocytes Relative: 7.9 % (ref 3.0–12.0)
Neutro Abs: 2.4 10*3/uL (ref 1.4–7.7)
Neutrophils Relative %: 51.9 % (ref 43.0–77.0)
Platelets: 194 10*3/uL (ref 150.0–400.0)
RBC: 4.07 Mil/uL (ref 3.87–5.11)
RDW: 13.5 % (ref 11.5–15.5)
WBC: 4.7 10*3/uL (ref 4.0–10.5)

## 2024-05-11 LAB — LIPID PANEL
Cholesterol: 163 mg/dL (ref 0–200)
HDL: 71.5 mg/dL (ref >39.00)
LDL Cholesterol: 74 mg/dL (ref 0–99)
NonHDL: 91.51
Total CHOL/HDL Ratio: 2
Triglycerides: 87 mg/dL (ref 0.0–149.0)
VLDL: 17.4 mg/dL (ref 0.0–40.0)

## 2024-05-11 LAB — HEMOGLOBIN A1C: Hgb A1c MFr Bld: 5.3 % (ref 4.6–6.5)

## 2024-05-11 LAB — LDL CHOLESTEROL, DIRECT: Direct LDL: 71 mg/dL

## 2024-05-11 LAB — TSH: TSH: 0.753 u[IU]/mL (ref 0.450–4.500)
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
## 2024-05-11 NOTE — Assessment & Plan Note (Signed)
 Reviewed goals of care which include wight loss and normalliation of LFT's.  She has been advised to resume ozmepic  Lab Results  Component Value Date   ALT 14 12/07/2023   AST 18 12/07/2023   ALKPHOS 76 12/07/2023   BILITOT 0.6 12/07/2023

## 2024-05-11 NOTE — Assessment & Plan Note (Signed)
 She has lost 118 lbs since 2021 but gained weight on maximal ozempic  dose .  Encouarged to resume ozempic  and a daily  walking program for exercise.  Overdue for microalbumin  check and diabetic eye exam .And A1c is now in the normal range    Lab Results  Component Value Date   HGBA1C 5.3 11/20/2023

## 2024-05-11 NOTE — Assessment & Plan Note (Signed)
 Her blood pressure remains well controlled on current regimen. Renal function and assesment of hyponatremia seen in December are due   Lab Results  Component Value Date   CREATININE 0.58 11/20/2023   Lab Results  Component Value Date   NA 132 (L) 11/20/2023   K 4.2 11/20/2023   CL 98 11/20/2023   CO2 28 11/20/2023

## 2024-05-14 ENCOUNTER — Ambulatory Visit: Payer: Self-pay | Admitting: Internal Medicine

## 2024-05-18 ENCOUNTER — Ambulatory Visit: Attending: Internal Medicine

## 2024-05-18 DIAGNOSIS — M5412 Radiculopathy, cervical region: Secondary | ICD-10-CM | POA: Insufficient documentation

## 2024-05-18 DIAGNOSIS — M542 Cervicalgia: Secondary | ICD-10-CM | POA: Diagnosis not present

## 2024-05-18 DIAGNOSIS — G959 Disease of spinal cord, unspecified: Secondary | ICD-10-CM | POA: Insufficient documentation
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
## 2024-05-24 ENCOUNTER — Ambulatory Visit

## 2024-05-25 ENCOUNTER — Ambulatory Visit (INDEPENDENT_AMBULATORY_CARE_PROVIDER_SITE_OTHER): Admitting: *Deleted

## 2024-05-25 VITALS — Ht 67.0 in | Wt 243.0 lb

## 2024-05-25 DIAGNOSIS — Z0000 Encounter for general adult medical examination without abnormal findings: Secondary | ICD-10-CM

## 2024-05-25 NOTE — Patient Instructions (Addendum)
 Katie Huber , Thank you for taking time out of your busy schedule to complete your Annual Wellness Visit with me. I enjoyed our conversation and look forward to speaking with you again next year. I, as well as your care team,  appreciate your ongoing commitment to your health goals. Please review the following plan we discussed and let me know if I can assist you in the future. Your Game plan/ To Do List    Referrals: If you haven't heard from the office you've been referred to, please reach out to them at the phone provided.   Remember to update your tetanus vaccine at your pharmacy.  Follow up Visits: Next Medicare AWV with our clinical staff: 05/29/25 @ 2:20   Have you seen your provider in the last 6 months (3 months if uncontrolled diabetes)? Yes Next Office Visit with your provider: Patient stated that she will call the office and schedule  Clinician Recommendations:  Aim for 30 minutes of exercise or brisk walking, 6-8 glasses of water, and 5 servings of fruits and vegetables each day.       This is a list of the screening recommended for you and due dates:  Health Maintenance  Topic Date Due   Eye exam for diabetics  06/20/2022   DTaP/Tdap/Td vaccine (2 - Td or Tdap) 09/21/2023   Complete foot exam   05/03/2024   Mammogram  06/30/2024   Flu Shot  07/01/2024   Hemoglobin A1C  11/09/2024   Yearly kidney health urinalysis for diabetes  12/15/2024   Yearly kidney function blood test for diabetes  05/10/2025   Medicare Annual Wellness Visit  05/25/2025   Colon Cancer Screening  07/13/2026   Pneumococcal Vaccine for age over 67  Completed   DEXA scan (bone density measurement)  Completed   Hepatitis C Screening  Completed   Zoster (Shingles) Vaccine  Completed   Hepatitis B Vaccine  Aged Out   HPV Vaccine  Aged Out   Meningitis B Vaccine  Aged Out   COVID-19 Vaccine  Discontinued   Cologuard (Stool DNA test)  Discontinued    Advanced directives: (Copy Requested) Please bring  a copy of your health care power of attorney and living will to the office to be added to your chart at your convenience. You can mail to [facility] [address] [place], [place] [address] or email to [email or web address] Advance Care Planning is important because it:  [x]  Makes sure you receive the medical care that is consistent with your values, goals, and preferences  [x]  It provides guidance to your family and loved ones and reduces their decisional burden about whether or not they are making the right decisions based on your wishes.
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
## 2024-05-27 ENCOUNTER — Ambulatory Visit

## 2024-05-30 ENCOUNTER — Telehealth: Payer: Self-pay

## 2024-05-30 NOTE — Telephone Encounter (Signed)
 Spoke with pt and informed her that we have received her patient assistance medication and it is ready for pick up.   Ozempic : 4 boxes

## 2024-06-01 ENCOUNTER — Other Ambulatory Visit: Payer: Self-pay | Admitting: Internal Medicine

## 2024-06-02 ENCOUNTER — Encounter: Payer: Self-pay | Admitting: Internal Medicine

## 2024-06-02 ENCOUNTER — Encounter

## 2024-06-02 NOTE — Telephone Encounter (Signed)
 Spoke with patient and she says she is not using it every day, but at least every other day. Patient says some days she is taking it every day and some days she is taking every other day and says it is consistently. Please advise?

## 2024-06-08 ENCOUNTER — Ambulatory Visit: Attending: Internal Medicine

## 2024-06-08 DIAGNOSIS — M542 Cervicalgia: Secondary | ICD-10-CM | POA: Insufficient documentation

## 2024-06-08 DIAGNOSIS — M5412 Radiculopathy, cervical region: Secondary | ICD-10-CM | POA: Diagnosis not present
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
## 2024-06-15 ENCOUNTER — Ambulatory Visit

## 2024-06-22 ENCOUNTER — Ambulatory Visit

## 2024-06-22 DIAGNOSIS — M542 Cervicalgia: Secondary | ICD-10-CM

## 2024-06-22 DIAGNOSIS — M5412 Radiculopathy, cervical region: Secondary | ICD-10-CM
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
## 2024-06-24 ENCOUNTER — Ambulatory Visit

## 2024-06-24 DIAGNOSIS — M542 Cervicalgia: Secondary | ICD-10-CM | POA: Diagnosis not present

## 2024-06-24 DIAGNOSIS — M5412 Radiculopathy, cervical region: Secondary | ICD-10-CM
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
## 2024-06-27 ENCOUNTER — Encounter

## 2024-06-28 ENCOUNTER — Encounter: Payer: Self-pay | Admitting: Internal Medicine

## 2024-06-29 ENCOUNTER — Ambulatory Visit

## 2024-06-29 DIAGNOSIS — M542 Cervicalgia: Secondary | ICD-10-CM

## 2024-06-29 DIAGNOSIS — M5412 Radiculopathy, cervical region: Secondary | ICD-10-CM
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
## 2024-07-03 ENCOUNTER — Encounter: Payer: Self-pay | Admitting: Internal Medicine

## 2024-07-03 ENCOUNTER — Other Ambulatory Visit: Payer: Self-pay | Admitting: Internal Medicine

## 2024-07-03 DIAGNOSIS — M5000 Cervical disc disorder with myelopathy, unspecified cervical region: Secondary | ICD-10-CM

## 2024-07-03 DIAGNOSIS — R42 Dizziness and giddiness: Secondary | ICD-10-CM

## 2024-07-03 DIAGNOSIS — H9313 Tinnitus, bilateral: Secondary | ICD-10-CM

## 2024-07-04 ENCOUNTER — Other Ambulatory Visit: Payer: Self-pay | Admitting: Internal Medicine

## 2024-07-06 ENCOUNTER — Ambulatory Visit: Attending: Internal Medicine

## 2024-07-06 DIAGNOSIS — M5412 Radiculopathy, cervical region: Secondary | ICD-10-CM | POA: Insufficient documentation

## 2024-07-06 DIAGNOSIS — M542 Cervicalgia: Secondary | ICD-10-CM | POA: Insufficient documentation
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
## 2024-07-08 ENCOUNTER — Ambulatory Visit

## 2024-07-08 DIAGNOSIS — M542 Cervicalgia: Secondary | ICD-10-CM | POA: Diagnosis not present

## 2024-07-08 DIAGNOSIS — M5412 Radiculopathy, cervical region: Secondary | ICD-10-CM
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
## 2024-07-11 ENCOUNTER — Ambulatory Visit
Admission: RE | Admit: 2024-07-11 | Discharge: 2024-07-11 | Disposition: A | Discharge status: Home / Self Care | Source: Ambulatory Visit | Attending: Internal Medicine | Admitting: Internal Medicine

## 2024-07-11 DIAGNOSIS — M5001 Cervical disc disorder with myelopathy,  high cervical region: Secondary | ICD-10-CM | POA: Diagnosis not present

## 2024-07-11 DIAGNOSIS — R200 Anesthesia of skin: Secondary | ICD-10-CM | POA: Diagnosis not present

## 2024-07-11 DIAGNOSIS — M5000 Cervical disc disorder with myelopathy, unspecified cervical region: Secondary | ICD-10-CM | POA: Insufficient documentation

## 2024-07-11 DIAGNOSIS — R42 Dizziness and giddiness: Secondary | ICD-10-CM | POA: Insufficient documentation

## 2024-07-11 DIAGNOSIS — R519 Headache, unspecified: Secondary | ICD-10-CM | POA: Diagnosis not present

## 2024-07-11 DIAGNOSIS — R9082 White matter disease, unspecified: Secondary | ICD-10-CM | POA: Diagnosis not present

## 2024-07-11 DIAGNOSIS — M4803 Spinal stenosis, cervicothoracic region: Secondary | ICD-10-CM | POA: Diagnosis not present

## 2024-07-11 MED ORDER — GADOBUTROL 1 MMOL/ML IV SOLN
10.0000 mL | Freq: Once | INTRAVENOUS | Status: AC | PRN
  Administered 2024-07-11 (×2): 10 mL via INTRAVENOUS

## 2024-07-13 ENCOUNTER — Ambulatory Visit

## 2024-07-13 DIAGNOSIS — M542 Cervicalgia: Secondary | ICD-10-CM

## 2024-07-13 DIAGNOSIS — M5412 Radiculopathy, cervical region: Secondary | ICD-10-CM
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
## 2024-07-15 ENCOUNTER — Ambulatory Visit

## 2024-07-15 DIAGNOSIS — M5412 Radiculopathy, cervical region: Secondary | ICD-10-CM

## 2024-07-15 DIAGNOSIS — M542 Cervicalgia: Secondary | ICD-10-CM | POA: Diagnosis not present
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
## 2024-07-20 ENCOUNTER — Ambulatory Visit

## 2024-07-20 DIAGNOSIS — M542 Cervicalgia: Secondary | ICD-10-CM | POA: Diagnosis not present

## 2024-07-20 DIAGNOSIS — M5412 Radiculopathy, cervical region: Secondary | ICD-10-CM
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
## 2024-07-21 ENCOUNTER — Ambulatory Visit: Payer: Self-pay | Admitting: Internal Medicine

## 2024-07-22 ENCOUNTER — Ambulatory Visit

## 2024-07-25 ENCOUNTER — Ambulatory Visit (INDEPENDENT_AMBULATORY_CARE_PROVIDER_SITE_OTHER): Admitting: Internal Medicine

## 2024-07-25 ENCOUNTER — Encounter: Payer: Self-pay | Admitting: Internal Medicine

## 2024-07-25 VITALS — BP 128/66 | HR 74 | Ht 67.0 in | Wt 244.0 lb

## 2024-07-25 DIAGNOSIS — R200 Anesthesia of skin: Secondary | ICD-10-CM | POA: Diagnosis not present

## 2024-07-25 DIAGNOSIS — R42 Dizziness and giddiness: Secondary | ICD-10-CM | POA: Diagnosis not present

## 2024-07-25 DIAGNOSIS — E1169 Type 2 diabetes mellitus with other specified complication: Secondary | ICD-10-CM | POA: Diagnosis not present

## 2024-07-25 DIAGNOSIS — I10 Essential (primary) hypertension: Secondary | ICD-10-CM

## 2024-07-25 DIAGNOSIS — E669 Obesity, unspecified: Secondary | ICD-10-CM

## 2024-07-25 DIAGNOSIS — R202 Paresthesia of skin: Secondary | ICD-10-CM | POA: Diagnosis not present

## 2024-07-25 DIAGNOSIS — Z1231 Encounter for screening mammogram for malignant neoplasm of breast: Secondary | ICD-10-CM

## 2024-07-25 DIAGNOSIS — E785 Hyperlipidemia, unspecified: Secondary | ICD-10-CM

## 2024-07-25 DIAGNOSIS — E538 Deficiency of other specified B group vitamins: Secondary | ICD-10-CM

## 2024-07-25 NOTE — Patient Instructions (Signed)
 Please reduce your gabapentin  dose by NOT MORE THAN 300 mg every 4 days:   Currently you are taking      600  900     600  Start taking                       300 900   600   for 4 days  , then       300 600 600    for 4 days ,  then   300  300 600    for 4 days ,  then   300 300 300   Etc  If the dizziness resolves, stay at whatever regimen you were on when it resolves
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
## 2024-07-26 DIAGNOSIS — R42 Dizziness and giddiness: Secondary | ICD-10-CM | POA: Insufficient documentation

## 2024-07-26 DIAGNOSIS — R200 Anesthesia of skin: Secondary | ICD-10-CM | POA: Insufficient documentation

## 2024-07-26 DIAGNOSIS — M5412 Radiculopathy, cervical region: Secondary | ICD-10-CM | POA: Insufficient documentation

## 2024-07-26 LAB — COMPREHENSIVE METABOLIC PANEL WITH GFR
ALT: 15 U/L (ref 0–35)
AST: 18 U/L (ref 0–37)
Albumin: 4.2 g/dL (ref 3.5–5.2)
Alkaline Phosphatase: 66 U/L (ref 39–117)
BUN: 19 mg/dL (ref 6–23)
CO2: 28 meq/L (ref 19–32)
Calcium: 9.5 mg/dL (ref 8.4–10.5)
Chloride: 98 meq/L (ref 96–112)
Creatinine, Ser: 0.67 mg/dL (ref 0.40–1.20)
GFR: 86.85 mL/min (ref >60.00)
Glucose, Bld: 97 mg/dL (ref 70–99)
Potassium: 4.4 meq/L (ref 3.5–5.1)
Sodium: 135 meq/L (ref 135–145)
Total Bilirubin: 0.5 mg/dL (ref 0.2–1.2)
Total Protein: 6.7 g/dL (ref 6.0–8.3)

## 2024-07-26 LAB — B12 AND FOLATE PANEL
Folate: >22.9 ng/mL (ref >5.9)
Vitamin B-12: 156 pg/mL — ABNORMAL LOW (ref 211–911)

## 2024-07-26 NOTE — Assessment & Plan Note (Signed)
 B12 deficiency and gabapentin  side effect suspected given lack of findings on MRI brain/ gabapentin  wean advised, and resuming B12 asap advised

## 2024-07-26 NOTE — Assessment & Plan Note (Addendum)
 Peripheral neuropathy suspected based on  mild findigs on MRI cervical spine vs B12 /gabapentin  issues. .  Referring to DR Maree for EMG/[place] studies

## 2024-07-26 NOTE — Assessment & Plan Note (Signed)
 secondary to metformin  use. managed with injections .has not taken injections or supplements  in over a year. For unclear reasons.  IF ab was negative in June 2025. May be the cause of her current neurologic symptoms.  She has been  advised to resume weekly injections.   Lab Results  Component Value Date   VITAMINB12 134 (L) 05/04/2023

## 2024-07-27 ENCOUNTER — Ambulatory Visit

## 2024-07-27 DIAGNOSIS — M542 Cervicalgia: Secondary | ICD-10-CM

## 2024-07-27 DIAGNOSIS — M5412 Radiculopathy, cervical region: Secondary | ICD-10-CM

## 2024-07-27 LAB — MICROALBUMIN / CREATININE URINE RATIO
Creatinine,U: 38.3 mg/dL
Microalb Creat Ratio: 18.4 mg/g (ref 0.0–30.0)
Microalb, Ur: 0.7 mg/dL (ref 0.0–1.9)
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
## 2024-07-29 ENCOUNTER — Ambulatory Visit

## 2024-07-29 ENCOUNTER — Ambulatory Visit: Payer: Self-pay | Admitting: Internal Medicine

## 2024-07-29 MED ORDER — SYRINGE 25G X 1" 3 ML MISC: 0 refills | Status: AC

## 2024-07-29 MED ORDER — CYANOCOBALAMIN 1000 MCG/ML IJ SOLN: 1000.0000 ug | Freq: Every day | INTRAMUSCULAR | 2 refills | Status: AC

## 2024-07-29 NOTE — Addendum Note (Signed)
 Addended by: MARYLYNN VERNEITA CROME on: 07/29/2024 09:00 AM   Modules accepted: Orders

## 2024-08-11 ENCOUNTER — Telehealth: Payer: Self-pay

## 2024-08-11 NOTE — Telephone Encounter (Signed)
 Completed refill order form for Ozempic  (novo nordisk) and faxed to provider for review and signature.

## 2024-08-30 ENCOUNTER — Telehealth: Payer: Self-pay

## 2024-08-30 NOTE — Telephone Encounter (Signed)
 Copied from CRM #[id number]. Topic: General - Other >> Aug 30, 2024  8:30 AM Thersia BROCKS wrote: Reason for CRM: Patient called in regarding a missed call from [place], would like a callback

## 2024-08-30 NOTE — Telephone Encounter (Signed)
 Spoke with pt and she stated that the message was from 07/29/2024. I let her know that was in regards to her lab results and getting her b12 injections scheduled. Pt stated that she saw her results on mychart and is giving herself the b12 injections.

## 2024-09-02 ENCOUNTER — Ambulatory Visit (INDEPENDENT_AMBULATORY_CARE_PROVIDER_SITE_OTHER): Admitting: Internal Medicine

## 2024-09-02 ENCOUNTER — Encounter: Payer: Self-pay | Admitting: Internal Medicine

## 2024-09-02 VITALS — BP 128/74 | HR 67 | Ht 67.0 in | Wt 244.2 lb

## 2024-09-02 DIAGNOSIS — E669 Obesity, unspecified: Secondary | ICD-10-CM | POA: Diagnosis not present

## 2024-09-02 DIAGNOSIS — E785 Hyperlipidemia, unspecified: Secondary | ICD-10-CM | POA: Diagnosis not present

## 2024-09-02 DIAGNOSIS — I10 Essential (primary) hypertension: Secondary | ICD-10-CM

## 2024-09-02 DIAGNOSIS — E1169 Type 2 diabetes mellitus with other specified complication: Secondary | ICD-10-CM

## 2024-09-02 DIAGNOSIS — G959 Disease of spinal cord, unspecified: Secondary | ICD-10-CM | POA: Diagnosis not present

## 2024-09-02 DIAGNOSIS — Z23 Encounter for immunization: Secondary | ICD-10-CM

## 2024-09-02 DIAGNOSIS — M5412 Radiculopathy, cervical region: Secondary | ICD-10-CM

## 2024-09-02 DIAGNOSIS — E538 Deficiency of other specified B group vitamins: Secondary | ICD-10-CM

## 2024-09-02 MED ORDER — FUROSEMIDE 20 MG PO TABS: ORAL_TABLET | ORAL | 1 refills | Status: AC

## 2024-09-02 MED ORDER — HYDROCODONE-ACETAMINOPHEN 10-325 MG PO TABS: 1.0000 | ORAL_TABLET | Freq: Four times a day (QID) | ORAL | 0 refills | Status: DC | PRN

## 2024-09-02 MED ORDER — LOSARTAN POTASSIUM 100 MG PO TABS: 100.0000 mg | ORAL_TABLET | Freq: Every day | ORAL | 1 refills | Status: AC

## 2024-09-02 MED ORDER — TRIAMTERENE-HCTZ 37.5-25 MG PO TABS: 1.0000 | ORAL_TABLET | Freq: Every day | ORAL | 1 refills | Status: AC

## 2024-09-02 NOTE — Patient Instructions (Addendum)
 Increase your bedtime dose of gabapentin  to 900 mg   Referral to Reeves Daisy , neurosurgeon in progress  Finish the weekly b12 injections,  then switch to 1000 to 2500 mcg oral B12 daily   Use the hydrocodone  only at night
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
## 2024-09-02 NOTE — Assessment & Plan Note (Addendum)
 She has decreased grip strength  in the left hand compared to the right. Referring to Reeves Daisy , MD.  Increase gabapentin  to 900 mg and add vicodin at night only

## 2024-09-04 NOTE — Assessment & Plan Note (Signed)
 She remains well-controlled on current medications .  hemoglobin A1c is at goal of less than 7.0 . Patient is reminded to schedule an annual eye exam and foot exam is normal today. Patient has no microalbuminuria. Patient is tolerating statin therapy for CAD risk reduction and on ACE/ARB for renal protection and hypertension    Lab Results  Component Value Date   CHOL 163 05/10/2024   HDL 71.50 05/10/2024   LDLCALC 74 05/10/2024   LDLDIRECT 71.0 05/10/2024   TRIG 87.0 05/10/2024   CHOLHDL 2 05/10/2024   Lab Results  Component Value Date   HGBA1C 5.3 05/10/2024

## 2024-09-05 ENCOUNTER — Telehealth: Payer: Self-pay

## 2024-09-05 NOTE — Telephone Encounter (Signed)
 Lvm notifing pt has ozempic  here ready for pick up, mychart message also sent  Ozempic  8mg /67ml 4 boxes Lot: [id number] Exp: 03/01/27

## 2024-09-06 ENCOUNTER — Ambulatory Visit: Admitting: Internal Medicine

## 2024-09-06 ENCOUNTER — Encounter: Payer: Self-pay | Admitting: Pharmacist

## 2024-09-07 ENCOUNTER — Telehealth: Payer: Self-pay | Admitting: Internal Medicine

## 2024-09-07 NOTE — Telephone Encounter (Signed)
 Pt came in today to pick up Ozempic . Could not addend previous note

## 2024-09-27 ENCOUNTER — Other Ambulatory Visit: Payer: Self-pay | Admitting: Internal Medicine

## 2024-09-27 DIAGNOSIS — G4700 Insomnia, unspecified: Secondary | ICD-10-CM
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
## 2024-10-03 ENCOUNTER — Other Ambulatory Visit: Payer: Self-pay | Admitting: Internal Medicine

## 2024-10-05 ENCOUNTER — Ambulatory Visit

## 2024-10-05 ENCOUNTER — Ambulatory Visit: Admitting: Orthopedic Surgery

## 2024-10-05 ENCOUNTER — Encounter: Payer: Self-pay | Admitting: Orthopedic Surgery

## 2024-10-05 VITALS — BP 120/78 | Wt 250.4 lb

## 2024-10-05 DIAGNOSIS — M4802 Spinal stenosis, cervical region: Secondary | ICD-10-CM

## 2024-10-05 DIAGNOSIS — R200 Anesthesia of skin: Secondary | ICD-10-CM

## 2024-10-05 DIAGNOSIS — M50222 Other cervical disc displacement at C5-C6 level: Secondary | ICD-10-CM

## 2024-10-05 DIAGNOSIS — R2681 Unsteadiness on feet: Secondary | ICD-10-CM | POA: Diagnosis not present

## 2024-10-05 DIAGNOSIS — M542 Cervicalgia: Secondary | ICD-10-CM | POA: Diagnosis not present

## 2024-10-05 DIAGNOSIS — M4312 Spondylolisthesis, cervical region: Secondary | ICD-10-CM

## 2024-10-05 DIAGNOSIS — R202 Paresthesia of skin: Secondary | ICD-10-CM

## 2024-10-05 DIAGNOSIS — M47812 Spondylosis without myelopathy or radiculopathy, cervical region: Secondary | ICD-10-CM

## 2024-10-05 NOTE — Patient Instructions (Signed)
 It was so nice to see you today. Thank you so much for coming in.    You have some wear and tear in your neck. I want to get xrays to look into this further. I will message you with results.   I want to get an MRI of your mid back to look into things further. We will get this approved through your insurance and [facility] will call you to schedule the appointment. Ask about your patient responsibility. You do not need to pay this prior to getting MRI, they can bill you.   [facility] is located at [address] [place] This is near the intersection of [address] and [address].   After you have the MRI, it can take 14-28 days for me to get the results back. If I don't have them in 2 weeks, we will call to try to get the results.   Once I have the results, we will call you to schedule a follow up phone visit with me to review them.   I want to get an EMG (nerve conduction test) to look into things further. I have ordered this and [facility] will call you to schedule. You can also call them at [phone].   Please do not hesitate to call if you have any questions or concerns. You can also message me in MyChart.   Glade Boys PA-C [phone]     The physicians and staff at [facility] Neurosurgery at [facility] are committed to providing excellent care. You may receive a survey asking for feedback about your experience at our office. We value you your feedback and appreciate you taking the time to to fill it out. The [facility] leadership team is also available to discuss your experience in person, feel free to contact us  [phone].

## 2024-10-14 ENCOUNTER — Other Ambulatory Visit: Payer: Self-pay

## 2024-10-14 ENCOUNTER — Encounter: Payer: Self-pay | Admitting: Neurology

## 2024-10-14 DIAGNOSIS — R202 Paresthesia of skin: Secondary | ICD-10-CM

## 2024-10-18 ENCOUNTER — Ambulatory Visit
Admission: RE | Admit: 2024-10-18 | Discharge: 2024-10-18 | Disposition: A | Discharge status: Home / Self Care | Source: Ambulatory Visit | Attending: Orthopedic Surgery | Admitting: Orthopedic Surgery

## 2024-10-18 DIAGNOSIS — M542 Cervicalgia: Secondary | ICD-10-CM | POA: Diagnosis not present

## 2024-10-18 DIAGNOSIS — M47812 Spondylosis without myelopathy or radiculopathy, cervical region: Secondary | ICD-10-CM

## 2024-10-18 DIAGNOSIS — M4312 Spondylolisthesis, cervical region: Secondary | ICD-10-CM

## 2024-10-18 DIAGNOSIS — R2681 Unsteadiness on feet: Secondary | ICD-10-CM

## 2024-10-18 DIAGNOSIS — R200 Anesthesia of skin: Secondary | ICD-10-CM

## 2024-10-29 ENCOUNTER — Other Ambulatory Visit: Payer: Self-pay | Admitting: Internal Medicine
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
## 2024-11-04 ENCOUNTER — Encounter: Admitting: Orthopedic Surgery

## 2024-11-04 ENCOUNTER — Encounter: Payer: Self-pay | Admitting: Orthopedic Surgery

## 2024-11-04 NOTE — Telephone Encounter (Signed)
 Cervical xrays dated 10/05/24:  FINDINGS:   BONES: Straightening of the normal cervical lordosis. Anterolisthesis of C5 on C6. No significant shift in alignment with flexion extension. No acute fracture. No aggressive appearing osseous lesion.   DISCS AND DEGENERATIVE CHANGES: Intervertebral disc spaces are maintained. Mild degenerative endplate osteophytes at C5-C6. Facet arthrosis at multiple levels, slightly more pronounced on the left.   SOFT TISSUES: No prevertebral soft tissue swelling. The visualized lungs appear clear.   IMPRESSION: 1. C5 on C6 anterolisthesis, similar to prior studies. No abnormal shift with flexion or extension. 2. Mild degenerative changes.   Electronically signed by: Donnice Mania MD 10/10/2024 11:36 PM EST RP Workstation: [id number]    Thoracic MRI dated 10/18/24:  FINDINGS:   BONES AND ALIGNMENT: Normal alignment. Vertebral body heights are maintained. A prominent hemangioma is present at C3. Chronic type 2 Modic changes are present posteriorly on the left at T4-5 and T5-6. Scattered fatty infiltration of the marrow spaces is present. Edematous endplate changes are present anteriorly at T12-L1.   SPINAL CORD: Normal spinal cord volume. Normal spinal cord signal.   SOFT TISSUES: Unremarkable.   DEGENERATIVE CHANGES: No significant disc herniation. No spinal canal stenosis or neural foraminal narrowing.   IMPRESSION: 1. No spinal canal stenosis or neural foraminal narrowing in the thoracic spine. 2. Chronic type 2 Modic changes posteriorly on the left at T4-5 and T5-6. 3. Edematous endplate changes anteriorly at T12-L1.   Electronically signed by: Lonni Necessary MD 10/21/2024 07:27 PM EST RP Workstation: [id number]   She was scheduled for phone visit today and call when straight to voicemail. I sent her a message.

## 2024-11-14 ENCOUNTER — Other Ambulatory Visit: Payer: Self-pay | Admitting: Internal Medicine

## 2024-11-29 ENCOUNTER — Ambulatory Visit: Admitting: Internal Medicine

## 2024-12-05 ENCOUNTER — Other Ambulatory Visit: Payer: Self-pay | Admitting: Internal Medicine

## 2024-12-05 ENCOUNTER — Ambulatory Visit: Admitting: Internal Medicine

## 2024-12-06 ENCOUNTER — Other Ambulatory Visit: Payer: Self-pay | Admitting: Internal Medicine

## 2024-12-06 ENCOUNTER — Encounter: Payer: Self-pay | Admitting: Internal Medicine

## 2024-12-07 ENCOUNTER — Ambulatory Visit
Admission: RE | Admit: 2024-12-07 | Discharge: 2024-12-07 | Disposition: A | Discharge status: Home / Self Care | Source: Ambulatory Visit | Attending: Internal Medicine | Admitting: Internal Medicine

## 2024-12-07 DIAGNOSIS — Z1231 Encounter for screening mammogram for malignant neoplasm of breast: Secondary | ICD-10-CM | POA: Diagnosis present

## 2024-12-08 NOTE — Telephone Encounter (Unsigned)
 Copied from CRM [id number]. Topic: Clinical - Request for Lab/Test Order >> Dec 08, 2024  2:20 PM Viola F wrote: Reason for CRM: Patient lab orders expired 09/02/24 and needs new lab orders reentered for her lab appointment 12/21/24

## 2024-12-12 ENCOUNTER — Other Ambulatory Visit: Payer: Self-pay | Admitting: Internal Medicine

## 2024-12-12 NOTE — Telephone Encounter (Signed)
 Labs that were ordered on 09/02/2024 are good until 09/02/2025.

## 2024-12-19 ENCOUNTER — Encounter: Payer: Self-pay | Admitting: Internal Medicine

## 2024-12-19 ENCOUNTER — Ambulatory Visit: Payer: PPO | Admitting: Internal Medicine

## 2024-12-19 VITALS — BP 110/70 | HR 68 | Temp 98.0°F | Resp 16 | Ht 67.0 in | Wt 245.0 lb

## 2024-12-19 DIAGNOSIS — R0602 Shortness of breath: Secondary | ICD-10-CM

## 2024-12-19 DIAGNOSIS — G4733 Obstructive sleep apnea (adult) (pediatric): Secondary | ICD-10-CM

## 2024-12-19 DIAGNOSIS — J4520 Mild intermittent asthma, uncomplicated: Secondary | ICD-10-CM

## 2024-12-19 NOTE — Patient Instructions (Signed)
 Asthma, Adult  Asthma is a condition that causes swelling and narrowing of the airways. These are the passages that lead from the nose and mouth down into the lungs. When asthma symptoms get worse it is called an asthma attack or flare. This can make it hard to breathe. Asthma flares can range from minor to life-threatening. There is no cure for asthma, but medicines and lifestyle changes can help to control it. What are the causes? It is not known exactly what causes asthma, but certain things can cause asthma symptoms to get worse (triggers). What can trigger an asthma attack? Cigarette smoke. Mold. Dust. Your pet's skin flakes (dander). Cockroaches. Pollen. Air pollution (like household cleaners, wood smoke, smog, or Therapist, occupational). What are the signs or symptoms? Trouble breathing (shortness of breath). Coughing. Making high-pitched whistling sounds when you breathe, most often when you breathe out (wheezing). Chest tightness. Tiredness with little activity. Poor exercise tolerance. How is this treated? Controller medicines that help prevent asthma symptoms. Fast-acting reliever or rescue medicines. These give short-term relief of asthma symptoms. Allergy medicines if your attacks are brought on by allergens. Medicines to help control the body's defense (immune) system. Staying away from the things that cause asthma attacks. Follow these instructions at home: Avoiding triggers in your home Do not allow anyone to smoke in your home. Limit use of fireplaces and wood stoves. Get rid of pests (such as roaches and mice) and their droppings. Keep your home clean. Clean your floors. Dust regularly. Use cleaning products that do not smell. Wash bed sheets and blankets every week in hot water. Dry them in a dryer. Have someone vacuum when you are not home. Change your heating and air conditioning filters often. Use blankets that are made of polyester or cotton. General  instructions Take over-the-counter and prescription medicines only as told by your doctor. Do not smoke or use any products that contain nicotine or tobacco. If you need help quitting, ask your doctor. Stay away from secondhand smoke. Avoid doing things outdoors when allergen counts are high and when air quality is low. Warm up before you exercise. Take time to cool down after exercise. Use a peak flow meter as told by your doctor. A peak flow meter is a tool that measures how well your lungs are working. Keep track of the peak flow meter's readings. Write them down. Follow your asthma action plan. This is a written plan for taking care of your asthma and treating your attacks. Make sure you get all the shots (vaccines) that your doctor recommends. Ask your doctor about a flu shot and a pneumonia shot. Keep all follow-up visits. Contact a doctor if: You have wheezing, shortness of breath, or a cough even while taking medicine to prevent attacks. The mucus you cough up (sputum) is thicker than usual. The mucus you cough up changes from clear or white to yellow, green, gray, or is bloody. You have problems from the medicine you are taking, such as: A rash. Itching. Swelling. Trouble breathing. You need reliever medicines more than 2-3 times a week. Your peak flow reading is still at 50-79% of your personal best after following the action plan for 1 hour. You have a fever. Get help right away if: You seem to be worse and are not responding to medicine during an asthma attack. You are short of breath even at rest. You get short of breath when doing very little activity. You have trouble eating, drinking, or talking. You have chest  pain or tightness. You have a fast heartbeat. Your lips or fingernails start to turn blue. You are light-headed or dizzy, or you faint. Your peak flow is less than 50% of your personal best. You feel too tired to breathe normally. These symptoms may be an  emergency. Get help right away. Call 911. Do not wait to see if the symptoms will go away. Do not drive yourself to the hospital. Summary Asthma is a long-term (chronic) condition in which the airways get tight and narrow. An asthma attack can make it hard to breathe. Asthma cannot be cured, but medicines and lifestyle changes can help control it. Make sure you understand how to avoid triggers and how and when to use your medicines. Avoid things that can cause allergy symptoms (allergens). These include animal skin flakes (dander) and pollen from trees or grass. Avoid things that pollute the air. These may include household cleaners, wood smoke, smog, or chemical odors. This information is not intended to replace advice given to you by your health care provider. Make sure you discuss any questions you have with your health care provider. Document Revised: 08/26/2021 Document Reviewed: 08/26/2021 Elsevier Patient Education  2024 [employer].

## 2024-12-19 NOTE — Progress Notes (Unsigned)
 [facility] [address] [place], [place] [address]  Pulmonary Sleep Medicine   Office Visit Note  Patient Name: Katie Huber DOB: 06-Aug-1951 MRN [id number]  Date of Service: 12/19/2024  Complaints/HPI: She is doing well overall. No flare ups of asthma. Not Using CPAP and she states she has not felt any difference. She states she has a elevated bed which helps  Office Spirometry Results:     ROS  General: (-) fever, (-) chills, (-) night sweats, (-) weakness Skin: (-) rashes, (-) itching,. Eyes: (-) visual changes, (-) redness, (-) itching. Nose and Sinuses: (-) nasal stuffiness or itchiness, (-) postnasal drip, (-) nosebleeds, (-) sinus trouble. Mouth and Throat: (-) sore throat, (-) hoarseness. Neck: (-) swollen glands, (-) enlarged thyroid , (-) neck pain. Respiratory: - cough, (-) bloody sputum, - shortness of breath, - wheezing. Cardiovascular: - ankle swelling, (-) chest pain. Lymphatic: (-) lymph node enlargement. Neurologic: (-) numbness, (-) tingling. Psychiatric: (-) anxiety, (-) depression   Current Medication: Outpatient Encounter Medications as of 12/19/2024  Medication Sig   albuterol  (PROVENTIL ) (2.5 MG/3ML) 0.083% nebulizer solution Take 3 mLs (2.5 mg total) by nebulization every 6 (six) hours as needed for wheezing or shortness of breath.   albuterol  (VENTOLIN  HFA) 108 (90 Base) MCG/ACT inhaler INHALE 2 PUFFS INTO LUNGS EVERY 6 HOURS AS NEEDED FOR WHEEZING OR SHORTNESS OF BREATH   amLODipine  (NORVASC ) 10 MG tablet TAKE 1 TABLET BY MOUTH ONCE DAILY   atorvastatin  (LIPITOR) 20 MG tablet TAKE ONE TABLET BY MOUTH EVERY DAY   Continuous Glucose Sensor (FREESTYLE LIBRE 2 SENSOR) MISC For diabetes management   cyanocobalamin  (VITAMIN B12) 1000 MCG/ML injection Inject 1 mL (1,000 mcg total) into the muscle daily. For 3 days,  then weekly for 4 weeks,  then monthly thereafter   cyclobenzaprine  (FLEXERIL ) 10 MG tablet TAKE ONE TABLET THREE TIMES A DAY  AS NEEDED FOR MUSCLE SPASMS   folic acid  (FOLVITE ) 1 MG tablet TAKE 1 TABLET BY MOUTH ONCE DAILY   furosemide  (LASIX ) 20 MG tablet TAKE ONE TABLET EVERY DAY AS NEEDED FOR FLUID RETENTION   gabapentin  (NEURONTIN ) 300 MG capsule TAKE 2 CAPSULES BY MOUTH EVERY MORNING, 3 CAPSULES IN THE AFTERNOON AND 3 CAPSULES IN THE EVENING AS DIRECTED   HYDROcodone -acetaminophen  (NORCO) 10-325 MG tablet TAKE ONE TABLET BY MOUTH EVERY 6 HOURS AS NEEDED   losartan  (COZAAR ) 100 MG tablet Take 1 tablet (100 mg total) by mouth daily.   montelukast  (SINGULAIR ) 10 MG tablet TAKE 1 TABLET BY MOUTH AT BEDTIME   omeprazole  (PRILOSEC) 20 MG capsule TAKE 1 CAPSULE BY MOUTH TWICE DAILY   PARoxetine  (PAXIL ) 20 MG tablet Take 1 tablet (20 mg total) by mouth every morning.   promethazine  (PHENERGAN ) 25 MG tablet Take 1 tablet (25 mg total) by mouth daily as needed for nausea or vomiting. TAKE 1 TABLET BY MOUTH EVERY 8 HOURS AS NEEDED FOR NAUSEA / VOMITING   Semaglutide , 2 MG/DOSE, (OZEMPIC , 2 MG/DOSE,) 8 MG/3ML SOPN Inject 2 mg into the skin once a week.   Syringe/Needle, Disp, (SYRINGE 3CC/25GX1) 25G X 1 3 ML MISC Use for b12 injections   triamterene -hydrochlorothiazide  (MAXZIDE -25) 37.5-25 MG tablet Take 1 tablet by mouth daily.   zolpidem  (AMBIEN ) 10 MG tablet TAKE ONE TABLET (10 MG) BY MOUTH AT BEDTIME AS NEEDED FOR SLEEP   Facility-Administered Encounter Medications as of 12/19/2024  Medication   pneumococcal 13-valent conjugate vaccine (PREVNAR 13) injection 0.5 mL    Surgical History: Past Surgical History:  Procedure Laterality Date   ABDOMINAL HYSTERECTOMY  2001   BREAST BIOPSY Right    Neg   BREAST EXCISIONAL BIOPSY Right 1990s   multiple hematomas   CHOLECYSTECTOMY  1970s   COLONOSCOPY WITH PROPOFOL  N/A 03/30/2018   Procedure: COLONOSCOPY WITH PROPOFOL ;  Surgeon: Jinny Carmine, MD;  Location: [facility] ENDOSCOPY;  Service: Endoscopy;  Laterality: N/A;   COLONOSCOPY WITH PROPOFOL  N/A 07/14/2023   Procedure:  COLONOSCOPY WITH PROPOFOL ;  Surgeon: Jinny Carmine, MD;  Location: [facility] ENDOSCOPY;  Service: Endoscopy;  Laterality: N/A;   HOT HEMOSTASIS  07/14/2023   Procedure: HOT HEMOSTASIS (ARGON PLASMA COAGULATION/BICAP);  Surgeon: Jinny Carmine, MD;  Location: [facility] ENDOSCOPY;  Service: Endoscopy;;   JOINT REPLACEMENT     Bilateral   POLYPECTOMY  07/14/2023   Procedure: POLYPECTOMY;  Surgeon: Jinny Carmine, MD;  Location: [facility] ENDOSCOPY;  Service: Endoscopy;;   STOMACH SURGERY  [phone]   gastric partitionin( for obesity)     Medical History: Past Medical History:  Diagnosis Date   Asthma 1990   Chronic respiratory failure with hypoxia (HCC) 12/11/2017   Hypertension    Obesity, Class III, BMI 40-49.9 (morbid obesity) (HCC)    weight fluctuations of 100 lbs     Family History: Family History  Problem Relation Age of Onset   Heart disease Father    COPD Father    Hyperlipidemia Father    Mental illness Brother    Breast cancer Neg Hx     Social History: Social History   Socioeconomic History   Marital status: Single    Spouse name: Not on file   Number of children: Not on file   Years of education: Not on file   Highest education level: Bachelor's degree (e.g., BA, AB, BS)  Occupational History   Not on file  Tobacco Use   Smoking status: Never   Smokeless tobacco: Never  Vaping Use   Vaping status: Never Used  Substance and Sexual Activity   Alcohol use: Yes    Comment: ocassionally   Drug use: No   Sexual activity: Never  Other Topics Concern   Not on file  Social History Narrative   Not on file   Social Drivers of Health   Tobacco Use: Low Risk (12/19/2024)   Patient History    Smoking Tobacco Use: Never    Smokeless Tobacco Use: Never    Passive Exposure: Not on file  Financial Resource Strain: Low Risk (07/22/2024)   Overall Financial Resource Strain (CARDIA)    Difficulty of Paying Living Expenses: Not hard at all  Food Insecurity: No Food Insecurity (07/22/2024)    Epic    Worried About Radiation Protection Practitioner of Food in the Last Year: Never true    Ran Out of Food in the Last Year: Never true  Transportation Needs: No Transportation Needs (07/22/2024)   Epic    Lack of Transportation (Medical): No    Lack of Transportation (Non-Medical): No  Physical Activity: Insufficiently Active (07/22/2024)   Exercise Vital Sign    Days of Exercise per Week: 2 days    Minutes of Exercise per Session: 30 min  Stress: No Stress Concern Present (07/22/2024)   [employer] of Occupational Health - Occupational Stress Questionnaire    Feeling of Stress: Only a little  Social Connections: Unknown (07/22/2024)   Social Connection and Isolation Panel    Frequency of Communication with Friends and Family: Not on file    Frequency of Social Gatherings with Friends and Family: Once a week  Attends Religious Services: More than 4 times per year    Active Member of Clubs or Organizations: Yes    Attends Banker Meetings: 1 to 4 times per year    Marital Status: Divorced  Intimate Partner Violence: Not At Risk (05/25/2024)   Epic    Fear of Current or Ex-Partner: No    Emotionally Abused: No    Physically Abused: No    Sexually Abused: No  Depression (PHQ2-9): Low Risk (09/02/2024)   Depression (PHQ2-9)    PHQ-2 Score: 0  Alcohol Screen: Low Risk (07/22/2024)   Alcohol Screen    Last Alcohol Screening Score (AUDIT): 2  Housing: Unknown (07/22/2024)   Epic    Unable to Pay for Housing in the Last Year: No    Number of Times Moved in the Last Year: Not on file    Homeless in the Last Year: No  Utilities: Not At Risk (05/25/2024)   Epic    Threatened with loss of utilities: No  Health Literacy: Adequate Health Literacy (05/25/2024)   B1300 Health Literacy    Frequency of need for help with medical instructions: Never    Vital Signs: Blood pressure 110/70, pulse 68, temperature 98 F (36.7 C), resp. rate 16, height 5' 7 (1.702 m), weight 245 lb (111.1 kg),  SpO2 99%.  Examination: General Appearance: The patient is well-developed, well-nourished, and in no distress. Skin: Gross inspection of skin unremarkable. Head: normocephalic, no gross deformities. Eyes: no gross deformities noted. ENT: ears appear grossly normal no exudates. Neck: Supple. No thyromegaly. No LAD. Respiratory: no rhonchi noted. Cardiovascular: Normal S1 and S2 without murmur or rub. Extremities: No cyanosis. pulses are equal. Neurologic: Alert and oriented. No involuntary movements.  LABS: No results found for this or any previous visit (from the past 2160 hours).  Radiology: MM 3D SCREENING MAMMOGRAM BILATERAL BREAST Result Date: 12/09/2024 CLINICAL DATA:  Screening. EXAM: DIGITAL SCREENING BILATERAL MAMMOGRAM WITH TOMOSYNTHESIS AND CAD TECHNIQUE: Bilateral screening digital craniocaudal and mediolateral oblique mammograms were obtained. Bilateral screening digital breast tomosynthesis was performed. The images were evaluated with computer-aided detection. COMPARISON:  Previous exam(s). ACR Breast Density Category b: There are scattered areas of fibroglandular density. FINDINGS: There are no findings suspicious for malignancy. IMPRESSION: No mammographic evidence of malignancy. A result letter of this screening mammogram will be mailed directly to the patient. RECOMMENDATION: Screening mammogram in one year. (Code:SM-B-01Y) BI-RADS CATEGORY  1: Negative. Electronically Signed   By: Dina  Arceo M.D.   On: 12/09/2024 08:58    No results found.  MM 3D SCREENING MAMMOGRAM BILATERAL BREAST Result Date: 12/09/2024 CLINICAL DATA:  Screening. EXAM: DIGITAL SCREENING BILATERAL MAMMOGRAM WITH TOMOSYNTHESIS AND CAD TECHNIQUE: Bilateral screening digital craniocaudal and mediolateral oblique mammograms were obtained. Bilateral screening digital breast tomosynthesis was performed. The images were evaluated with computer-aided detection. COMPARISON:  Previous exam(s). ACR Breast Density  Category b: There are scattered areas of fibroglandular density. FINDINGS: There are no findings suspicious for malignancy. IMPRESSION: No mammographic evidence of malignancy. A result letter of this screening mammogram will be mailed directly to the patient. RECOMMENDATION: Screening mammogram in one year. (Code:SM-B-01Y) BI-RADS CATEGORY  1: Negative. Electronically Signed   By: Dina  Arceo M.D.   On: 12/09/2024 08:58    Assessment and Plan: Patient Active Problem List   Diagnosis Date Noted   Cervical myelopathy with cervical radiculopathy (HCC) 07/26/2024   Dizziness 07/26/2024   Nausea 12/07/2023   Bullae 09/23/2023   Skin lesion of foot 09/23/2023  Long-term current use of injectable noninsulin antidiabetic medication 05/05/2023   Anemia 05/05/2023   Body aches 08/22/2022   Hepatic steatosis 04/20/2022   Insomnia 04/20/2022   Major depressive disorder, recurrent episode with anxious distress 06/11/2021   Onychomycosis of right great toe 03/04/2021   Coronary artery calcification of native artery 11/10/2020   Abdominal aortic atherosclerosis 11/10/2020   Oral cavity pain 01/09/2020   Vitamin D  deficiency 01/09/2020   Diarrhea, functional 07/26/2018   Tubular adenoma of colon 04/01/2018   Benign neoplasm of cecum    Encounter for screening colonoscopy    Rectal polyp    B12 deficiency 01/26/2018   Hospital discharge follow-up 03/14/2017   Mild intermittent asthma 03/04/2017   Herpes simplex infection 08/16/2015   Skin neoplasm 08/16/2015   Pulmonary hypertension (HCC) 02/06/2015   S/P vaginal hysterectomy 06/13/2014   Encounter for preventive health examination 06/13/2014   Essential hypertension, benign 12/27/2013   S/P bariatric surgery 09/20/2013   Insomnia due to anxiety and fear 06/14/2013   Type 2 diabetes mellitus with obesity 03/02/2013   Hyperlipidemia associated with type 2 diabetes mellitus (HCC) 12/17/2011   Encounter for long-term (current) use of other  medications 12/17/2011   Intervertebral cervical disc disorder with myelopathy, cervical region 12/16/2011   Sleep apnea 12/16/2011    1. Mild intermittent asthma without complication (Primary) ***  2. SOB (shortness of breath) *** - Spirometry with graph  3. OSA (obstructive sleep apnea) ***  4. Morbid obesity (HCC) ***   General Counseling: I have discussed the findings of the evaluation and examination with Macall.  I have also discussed any further diagnostic evaluation thatmay be needed or ordered today. Madisyn verbalizes understanding of the findings of todays visit. We also reviewed her medications today and discussed drug interactions and side effects including but not limited excessive drowsiness and altered mental states. We also discussed that there is always a risk not just to her but also people around her. she has been encouraged to call the office with any questions or concerns that should arise related to todays visit.  Orders Placed This Encounter  Procedures   Spirometry with graph    Where should this test be performed?:   [facility]     Time spent: 70  I have personally obtained a history, examined the patient, evaluated laboratory and imaging results, formulated the assessment and plan and placed orders.    Elfreda DELENA Bathe, MD [facility] Pulmonary and Critical Care Sleep medicine

## 2024-12-21 ENCOUNTER — Other Ambulatory Visit

## 2024-12-21 DIAGNOSIS — E669 Obesity, unspecified: Secondary | ICD-10-CM | POA: Diagnosis not present

## 2024-12-21 DIAGNOSIS — E119 Type 2 diabetes mellitus without complications: Secondary | ICD-10-CM

## 2024-12-21 DIAGNOSIS — E538 Deficiency of other specified B group vitamins: Secondary | ICD-10-CM

## 2024-12-21 LAB — LIPID PANEL
Cholesterol: 136 mg/dL (ref 28–200)
HDL: 62.3 mg/dL
LDL Cholesterol: 59 mg/dL (ref 10–99)
NonHDL: 74.17
Total CHOL/HDL Ratio: 2
Triglycerides: 77 mg/dL (ref 10.0–149.0)
VLDL: 15.4 mg/dL (ref 0.0–40.0)

## 2024-12-21 LAB — COMPREHENSIVE METABOLIC PANEL WITH GFR
ALT: 13 U/L (ref 3–35)
AST: 17 U/L (ref 5–37)
Albumin: 4.2 g/dL (ref 3.5–5.2)
Alkaline Phosphatase: 60 U/L (ref 39–117)
BUN: 12 mg/dL (ref 6–23)
CO2: 30 meq/L (ref 19–32)
Calcium: 10 mg/dL (ref 8.4–10.5)
Chloride: 96 meq/L (ref 96–112)
Creatinine, Ser: 0.58 mg/dL (ref 0.40–1.20)
GFR: 89.66 mL/min
Glucose, Bld: 95 mg/dL (ref 70–99)
Potassium: 3.6 meq/L (ref 3.5–5.1)
Sodium: 131 meq/L — ABNORMAL LOW (ref 135–145)
Total Bilirubin: 0.8 mg/dL (ref 0.2–1.2)
Total Protein: 6.5 g/dL (ref 6.0–8.3)

## 2024-12-21 LAB — B12 AND FOLATE PANEL
Folate: >23.4 ng/mL
Vitamin B-12: >1500 pg/mL — ABNORMAL HIGH (ref 211–911)

## 2024-12-21 LAB — MICROALBUMIN / CREATININE URINE RATIO
Creatinine,U: 55.5 mg/dL
Microalb Creat Ratio: UNDETERMINED mg/g (ref 0.0–30.0)
Microalb, Ur: <0.7 mg/dL

## 2024-12-21 LAB — HEMOGLOBIN A1C: Hgb A1c MFr Bld: 5 % (ref 4.6–6.5)

## 2024-12-22 ENCOUNTER — Ambulatory Visit: Admitting: Neurology

## 2024-12-22 DIAGNOSIS — R202 Paresthesia of skin: Secondary | ICD-10-CM

## 2024-12-22 DIAGNOSIS — G5603 Carpal tunnel syndrome, bilateral upper limbs: Secondary | ICD-10-CM

## 2024-12-22 NOTE — Procedures (Signed)
 " [facility]  [address] [place], [address]  [place], [place] [address] Tel: [phone] Fax: [phone] Test Date:  12/22/2024  Patient: Katie Huber DOB: Jul 29, 1951 Physician: Tonita Blanch, DO  Sex: Female Height: 5' 7 Ref Phys: Glade Boys, DEVONNA  ID#: [id number]   Technician:    History: This is a 74 year old female referred for evaluation of bilateral hand numbness.  NCV & EMG Findings: Extensive electrodiagnostic testing of the right upper extremity and additional studies of the left shows:  Bilateral median sensory responses are absent.  Bilateral ulnar sensory responses are within normal limits. Bilateral median motor responses show prolonged latency (R6.6, L5.6 ms) and reduced amplitude (R3.6, L1.0 mV).  Of note, there is evidence of bilateral Martin-Gruber anastomoses, a normal anatomic variant.  Bilateral ulnar motor responses are within normal limits.   Chronic motor axon loss changes are seen affecting bilateral abductor pollicis brevis muscles, without accompanying active denervation.  Impression: Bilateral median neuropathy at or distal to the wrist, consistent with a clinical diagnosis of carpal tunnel syndrome.  Overall, these findings are severe in degree electrically.   ___________________________ Tonita Blanch, DO    Nerve Conduction Studies   Stim Site NR Peak (ms) Norm Peak (ms) O-P Amp (V) Norm O-P Amp  Left Median Anti Sensory (2nd Digit)  32 C  Wrist *NR  <3.8  >10  Right Median Anti Sensory (2nd Digit)  32 C  Wrist *NR  <3.8  >10  Left Ulnar Anti Sensory (5th Digit)  32 C  Wrist    2.9 <3.2 33.1 >5  Right Ulnar Anti Sensory (5th Digit)  32 C  Wrist    2.7 <3.2 31.0 >5     Stim Site NR Onset (ms) Norm Onset (ms) O-P Amp (mV) Norm O-P Amp Site1 Site2 Delta-0 (ms) Dist (cm) Vel (m/s) Norm Vel (m/s)  Left Median Motor (Abd Poll Brev)  32 C  Wrist    *5.6 <4.0 *1.0 >5 Elbow Wrist 4.9 29.0 59 >50  Elbow    10.5  0.8  Axilla Elbow 6.4  0.0    Axilla    4.1  2.8         Right Median Motor (Abd Poll Brev)  32 C  Wrist    *6.6 <4.0 *3.6 >5 Elbow Wrist 3.5 31.0 89 >50  Elbow    10.1  3.5  Ulnar-wrist crossover Elbow 6.0 0.0    Ulnar-wrist crossover    4.1  4.6         Left Ulnar Motor (Abd Dig Minimi)  32 C  Wrist    2.5 <3.1 9.2 >7 B Elbow Wrist 3.8 21.0 55 >50  B Elbow    6.3  9.1  A Elbow B Elbow 1.6 10.0 62 >50  A Elbow    7.9  8.7         Right Ulnar Motor (Abd Dig Minimi)  32 C  Wrist    2.8 <3.1 8.7 >7 B Elbow Wrist 3.5 20.0 57 >50  B Elbow    6.3  8.5  A Elbow B Elbow 1.5 10.0 67 >50  A Elbow    7.8  8.3          Electromyography   Side Muscle Ins.Act Fibs Fasc Recrt Amp Dur Poly Activation Comment  Right 1stDorInt Nml Nml Nml Nml Nml Nml Nml Nml N/A  Right Abd Poll Brev Nml Nml Nml *1- *1+ *1+ *1+ Nml N/A  Right PronatorTeres Nml Nml Nml  Nml Nml Nml Nml Nml N/A  Right Biceps Nml Nml Nml Nml Nml Nml Nml Nml N/A  Right Triceps Nml Nml Nml Nml Nml Nml Nml Nml N/A  Right Deltoid Nml Nml Nml Nml Nml Nml Nml Nml N/A  Left 1stDorInt Nml Nml Nml Nml Nml Nml Nml Nml N/A  Left Abd Poll Brev Nml Nml Nml *2- *1+ *1+ *1+ Nml N/A  Left PronatorTeres Nml Nml Nml Nml Nml Nml Nml Nml N/A  Left Biceps Nml Nml Nml Nml Nml Nml Nml Nml N/A  Left Triceps Nml Nml Nml Nml Nml Nml Nml Nml N/A  Left Deltoid Nml Nml Nml Nml Nml Nml Nml Nml N/A      Waveforms:                    "

## 2024-12-24 ENCOUNTER — Ambulatory Visit: Payer: Self-pay | Admitting: Internal Medicine

## 2024-12-30 ENCOUNTER — Telehealth: Payer: Self-pay | Admitting: Orthopedic Surgery

## 2024-12-30 NOTE — Telephone Encounter (Signed)
 Per stacy:Please schedule Huber/u with me in office to discuss EMG results.  Katie Huber 12/30/2024 03:00 PM EST lvm ------------------------------------ Katie Huber 12/27/2024 01:13 PM EST lvm ------------------------------------ Katie Huber 12/23/2024 08:48 AM EST Left message to call our office back.

## 2025-01-02 ENCOUNTER — Ambulatory Visit: Admitting: Internal Medicine

## 2025-01-06 ENCOUNTER — Ambulatory Visit: Admitting: Internal Medicine

## 2025-01-06 ENCOUNTER — Encounter: Payer: Self-pay | Admitting: Internal Medicine

## 2025-01-06 VITALS — BP 110/68 | HR 70 | Temp 97.8°F | Ht 67.0 in | Wt 252.0 lb

## 2025-01-06 DIAGNOSIS — M5000 Cervical disc disorder with myelopathy, unspecified cervical region: Secondary | ICD-10-CM

## 2025-01-06 DIAGNOSIS — G959 Disease of spinal cord, unspecified: Secondary | ICD-10-CM

## 2025-01-06 DIAGNOSIS — Z0000 Encounter for general adult medical examination without abnormal findings: Secondary | ICD-10-CM

## 2025-01-06 DIAGNOSIS — E1169 Type 2 diabetes mellitus with other specified complication: Secondary | ICD-10-CM

## 2025-01-06 DIAGNOSIS — I272 Pulmonary hypertension, unspecified: Secondary | ICD-10-CM

## 2025-01-06 MED ORDER — GABAPENTIN 300 MG PO CAPS: 600.0000 mg | ORAL_CAPSULE | Freq: Two times a day (BID) | ORAL | 2 refills | Status: AC

## 2025-01-06 NOTE — Assessment & Plan Note (Addendum)
 There is no evidenc eon cervical or thoracic spine MRI of severe stenosis. . 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  She will follow up with neurosurgery

## 2025-01-06 NOTE — Patient Instructions (Signed)
 Glad you are feeling better!  Reeves Daisy, MD  for your carpal tunnel surgery

## 2025-01-06 NOTE — Assessment & Plan Note (Signed)
 She remains well-controlled on current medications .  hemoglobin A1c is at goal of less than 7.0 . Patient is reminded to schedule an annual eye exam and foot exam is normal today. Patient has no microalbuminuria. Patient is tolerating statin therapy for CAD risk reduction and on ACE/ARB for renal protection and hypertension    Lab Results  Component Value Date   CHOL 136 12/21/2024   HDL 62.30 12/21/2024   LDLCALC 59 12/21/2024   LDLDIRECT 71.0 05/10/2024   TRIG 77.0 12/21/2024   CHOLHDL 2 12/21/2024   Lab Results  Component Value Date   HGBA1C 5.0 12/21/2024

## 2025-01-06 NOTE — Progress Notes (Unsigned)
 "  Subjective:  Patient ID: Katie Huber, female    DOB: 18-Dec-1950  Age: 74 y.o. MRN: [id number]  CC: The primary encounter diagnosis was Encounter for preventive health examination. Diagnoses of Hyperlipidemia associated with type 2 diabetes mellitus (HCC), Cervical myelopathy with cervical radiculopathy (HCC), Pulmonary hypertension (HCC), and Intervertebral cervical disc disorder with myelopathy, cervical region were also pertinent to this visit.   HPI Katie Huber presents for  Chief Complaint  Patient presents with   Medical Management of Chronic Issues    Office visit   Dizziness and gait imbalance resolved with gabapentin  reduction  to 600 mg twice daily .  CTS on EMG nerve conductions studes have confirmed CTS which did not improve with braces.   HTN:  home readings 110.58  ; has stopped amlodipine    Type 2 DM:/obesity:  holiday weight gain .  Back o diet.  S  Sleeping better.  Has reduced alcohol    Outpatient Medications Prior to Visit  Medication Sig Dispense Refill   albuterol  (PROVENTIL ) (2.5 MG/3ML) 0.083% nebulizer solution Take 3 mLs (2.5 mg total) by nebulization every 6 (six) hours as needed for wheezing or shortness of breath. 150 mL 1   albuterol  (VENTOLIN  HFA) 108 (90 Base) MCG/ACT inhaler INHALE 2 PUFFS INTO LUNGS EVERY 6 HOURS AS NEEDED FOR WHEEZING OR SHORTNESS OF BREATH 25.5 g 2   amLODipine  (NORVASC ) 10 MG tablet TAKE 1 TABLET BY MOUTH ONCE DAILY 90 tablet 3   atorvastatin  (LIPITOR) 20 MG tablet TAKE ONE TABLET BY MOUTH EVERY DAY 90 tablet 3   Continuous Glucose Sensor (FREESTYLE LIBRE 2 SENSOR) MISC For diabetes management 6 each 3   cyanocobalamin  (VITAMIN B12) 1000 MCG/ML injection Inject 1 mL (1,000 mcg total) into the muscle daily. For 3 days,  then weekly for 4 weeks,  then monthly thereafter 10 mL 2   cyclobenzaprine  (FLEXERIL ) 10 MG tablet TAKE ONE TABLET THREE TIMES A DAY AS NEEDED FOR MUSCLE SPASMS 270 tablet 1   folic acid  (FOLVITE ) 1 MG  tablet TAKE 1 TABLET BY MOUTH ONCE DAILY 90 tablet 3   furosemide  (LASIX ) 20 MG tablet TAKE ONE TABLET EVERY DAY AS NEEDED FOR FLUID RETENTION 90 tablet 1   gabapentin  (NEURONTIN ) 300 MG capsule TAKE 2 CAPSULES BY MOUTH EVERY MORNING, 3 CAPSULES IN THE AFTERNOON AND 3 CAPSULES IN THE EVENING AS DIRECTED 720 capsule 0   HYDROcodone -acetaminophen  (NORCO) 10-325 MG tablet TAKE ONE TABLET BY MOUTH EVERY 6 HOURS AS NEEDED 30 tablet 0   losartan  (COZAAR ) 100 MG tablet Take 1 tablet (100 mg total) by mouth daily. 90 tablet 1   montelukast  (SINGULAIR ) 10 MG tablet TAKE 1 TABLET BY MOUTH AT BEDTIME 90 tablet 3   omeprazole  (PRILOSEC) 20 MG capsule TAKE 1 CAPSULE BY MOUTH TWICE DAILY 180 capsule 3   PARoxetine  (PAXIL ) 20 MG tablet Take 1 tablet (20 mg total) by mouth every morning. 90 tablet 3   promethazine  (PHENERGAN ) 25 MG tablet Take 1 tablet (25 mg total) by mouth daily as needed for nausea or vomiting. TAKE 1 TABLET BY MOUTH EVERY 8 HOURS AS NEEDED FOR NAUSEA / VOMITING 90 tablet 3   Semaglutide , 2 MG/DOSE, (OZEMPIC , 2 MG/DOSE,) 8 MG/3ML SOPN Inject 2 mg into the skin once a week.     Syringe/Needle, Disp, (SYRINGE 3CC/25GX1) 25G X 1 3 ML MISC Use for b12 injections 50 each 0   triamterene -hydrochlorothiazide  (MAXZIDE -25) 37.5-25 MG tablet Take 1 tablet by mouth daily. 90 tablet 1  zolpidem  (AMBIEN ) 10 MG tablet TAKE ONE TABLET (10 MG) BY MOUTH AT BEDTIME AS NEEDED FOR SLEEP 30 tablet 5   Facility-Administered Medications Prior to Visit  Medication Dose Route Frequency Provider Last Rate Last Admin   pneumococcal 13-valent conjugate vaccine (PREVNAR 13) injection 0.5 mL  0.5 mL Intramuscular Once Conny Moening L, MD        Review of Systems;  Patient denies headache, fevers, malaise, unintentional weight loss, skin rash, eye pain, sinus congestion and sinus pain, sore throat, dysphagia,  hemoptysis , cough, dyspnea, wheezing, chest pain, palpitations, orthopnea, edema, abdominal pain, nausea,  melena, diarrhea, constipation, flank pain, dysuria, hematuria, urinary  Frequency, nocturia, numbness, tingling, seizures,  Focal weakness, Loss of consciousness,  Tremor, insomnia, depression, anxiety, and suicidal ideation.      Objective:  BP 132/78 (BP Location: Left Arm)   Pulse 70   Temp 97.8 F (36.6 C)   Ht 5' 7 (1.702 m)   Wt 252 lb (114.3 kg)   SpO2 96%   BMI 39.47 kg/m   BP Readings from Last 3 Encounters:  01/06/25 132/78  12/19/24 110/70  10/05/24 120/78    Wt Readings from Last 3 Encounters:  01/06/25 252 lb (114.3 kg)  12/19/24 245 lb (111.1 kg)  10/05/24 250 lb 6.4 oz (113.6 kg)    Physical Exam  Lab Results  Component Value Date   HGBA1C 5.0 12/21/2024   HGBA1C 5.3 05/10/2024   HGBA1C 5.3 11/20/2023    Lab Results  Component Value Date   CREATININE 0.58 12/21/2024   CREATININE 0.67 07/25/2024   CREATININE 0.64 05/10/2024    Lab Results  Component Value Date   WBC 4.7 05/10/2024   HGB 13.1 05/10/2024   HCT 38.5 05/10/2024   PLT 194.0 05/10/2024   GLUCOSE 95 12/21/2024   CHOL 136 12/21/2024   TRIG 77.0 12/21/2024   HDL 62.30 12/21/2024   LDLDIRECT 71.0 05/10/2024   LDLCALC 59 12/21/2024   ALT 13 12/21/2024   AST 17 12/21/2024   NA 131 (L) 12/21/2024   K 3.6 12/21/2024   CL 96 12/21/2024   CREATININE 0.58 12/21/2024   BUN 12 12/21/2024   CO2 30 12/21/2024   TSH 0.753 05/10/2024   HGBA1C 5.0 12/21/2024   MICROALBUR <0.7 12/21/2024    MM 3D SCREENING MAMMOGRAM BILATERAL BREAST Result Date: 12/09/2024 CLINICAL DATA:  Screening. EXAM: DIGITAL SCREENING BILATERAL MAMMOGRAM WITH TOMOSYNTHESIS AND CAD TECHNIQUE: Bilateral screening digital craniocaudal and mediolateral oblique mammograms were obtained. Bilateral screening digital breast tomosynthesis was performed. The images were evaluated with computer-aided detection. COMPARISON:  Previous exam(s). ACR Breast Density Category b: There are scattered areas of fibroglandular density.  FINDINGS: There are no findings suspicious for malignancy. IMPRESSION: No mammographic evidence of malignancy. A result letter of this screening mammogram will be mailed directly to the patient. RECOMMENDATION: Screening mammogram in one year. (Code:SM-B-01Y) BI-RADS CATEGORY  1: Negative. Electronically Signed   By: Dina  Arceo M.D.   On: 12/09/2024 08:58    Assessment & Plan:   Problem List Items Addressed This Visit     Cervical myelopathy with cervical radiculopathy (HCC)   Encounter for preventive health examination - Primary   age appropriate education and counseling updated, referrals for preventative services and immunizations addressed, dietary and smoking counseling addressed, most recent labs reviewed.  I have personally reviewed and have noted:   1) the patient's medical and social history 2) The pt's use of alcohol, tobacco, and illicit drugs 3) The patient's current  medications and supplements 4) Functional ability including ADL's, fall risk, home safety risk, hearing and visual impairment 5) Diet and physical activities 6) Evidence for depression or mood disorder 7) The patient's height, weight, and BMI have been recorded in the chart   I have made referrals, and provided counseling and education based on review of the above       Hyperlipidemia associated with type 2 diabetes mellitus (HCC)   She remains well-controlled on current medications .  hemoglobin A1c is at goal of less than 7.0 . Patient is reminded to schedule an annual eye exam and foot exam is normal today. Patient has no microalbuminuria. Patient is tolerating statin therapy for CAD risk reduction and on ACE/ARB for renal protection and hypertension    Lab Results  Component Value Date   CHOL 136 12/21/2024   HDL 62.30 12/21/2024   LDLCALC 59 12/21/2024   LDLDIRECT 71.0 05/10/2024   TRIG 77.0 12/21/2024   CHOLHDL 2 12/21/2024   Lab Results  Component Value Date   HGBA1C 5.0 12/21/2024          Intervertebral cervical disc disorder with myelopathy, cervical region   Complicated by severe CTS noted on EMG sutdies done January.  She was referred to PT and neurosurgeyr       Pulmonary hypertension (HCC)       I spent 34 minutes on the day of this face to face encounter reviewing patient's  most recent visit with cardiology,  nephrology,  and neurology,  prior relevant surgical and non surgical procedures, recent  labs and imaging studies, counseling on weight management,  reviewing the assessment and plan with patient, and post visit ordering and reviewing of  diagnostics and therapeutics with patient  .   Follow-up: No follow-ups on file.   Katie LITTIE Kettering, MD "

## 2025-01-06 NOTE — Assessment & Plan Note (Signed)

## 2025-05-29 ENCOUNTER — Ambulatory Visit

## 2025-12-19 ENCOUNTER — Ambulatory Visit: Admitting: Internal Medicine
